# Patient Record
Sex: Female | Born: 1950
Health system: Southern US, Community
[De-identification: ages and names within clinical notes are randomized; demographics above are authoritative.]

## PROBLEM LIST (undated history)

## (undated) DIAGNOSIS — T7840XA Allergy, unspecified, initial encounter: Secondary | ICD-10-CM

## (undated) DIAGNOSIS — M1990 Unspecified osteoarthritis, unspecified site: Secondary | ICD-10-CM

## (undated) DIAGNOSIS — E785 Hyperlipidemia, unspecified: Secondary | ICD-10-CM

## (undated) DIAGNOSIS — E119 Type 2 diabetes mellitus without complications: Secondary | ICD-10-CM

## (undated) DIAGNOSIS — Z9109 Other allergy status, other than to drugs and biological substances: Secondary | ICD-10-CM

## (undated) DIAGNOSIS — H269 Unspecified cataract: Secondary | ICD-10-CM

## (undated) DIAGNOSIS — M810 Age-related osteoporosis without current pathological fracture: Secondary | ICD-10-CM

## (undated) DIAGNOSIS — R011 Cardiac murmur, unspecified: Secondary | ICD-10-CM

## (undated) DIAGNOSIS — L439 Lichen planus, unspecified: Secondary | ICD-10-CM

## (undated) DIAGNOSIS — K5792 Diverticulitis of intestine, part unspecified, without perforation or abscess without bleeding: Secondary | ICD-10-CM

## (undated) DIAGNOSIS — K219 Gastro-esophageal reflux disease without esophagitis: Secondary | ICD-10-CM

## (undated) HISTORY — DX: Unspecified cataract: H26.9

## (undated) HISTORY — DX: Allergy, unspecified, initial encounter: T78.40XA

## (undated) HISTORY — PX: CARPAL TUNNEL RELEASE: SHX101

## (undated) HISTORY — DX: Type 2 diabetes mellitus without complications: E11.9

## (undated) HISTORY — DX: Hyperlipidemia, unspecified: E78.5

## (undated) HISTORY — DX: Cardiac murmur, unspecified: R01.1

## (undated) HISTORY — DX: Unspecified osteoarthritis, unspecified site: M19.90

## (undated) HISTORY — DX: Age-related osteoporosis without current pathological fracture: M81.0

## (undated) HISTORY — DX: Diverticulitis of intestine, part unspecified, without perforation or abscess without bleeding: K57.92

## (undated) HISTORY — DX: Gastro-esophageal reflux disease without esophagitis: K21.9

---
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
## 1984-10-25 HISTORY — PX: BACK SURGERY: SHX140

## 2000-06-23 ENCOUNTER — Encounter: Admission: RE | Admit: 2000-06-23 | Discharge: 2000-06-23 | Payer: Self-pay | Admitting: Family Medicine

## 2000-07-20 ENCOUNTER — Encounter: Admission: RE | Admit: 2000-07-20 | Discharge: 2000-07-20 | Payer: Self-pay | Admitting: Family Medicine

## 2000-07-25 ENCOUNTER — Encounter (INDEPENDENT_AMBULATORY_CARE_PROVIDER_SITE_OTHER): Payer: Self-pay | Admitting: *Deleted

## 2000-07-27 ENCOUNTER — Encounter: Admission: RE | Admit: 2000-07-27 | Discharge: 2000-07-27 | Payer: Self-pay | Admitting: *Deleted

## 2000-07-27 ENCOUNTER — Encounter: Payer: Self-pay | Admitting: *Deleted

## 2000-10-07 ENCOUNTER — Encounter: Admission: RE | Admit: 2000-10-07 | Discharge: 2000-10-07 | Payer: Self-pay | Admitting: Family Medicine

## 2001-04-10 ENCOUNTER — Encounter: Admission: RE | Admit: 2001-04-10 | Discharge: 2001-04-10 | Payer: Self-pay | Admitting: Family Medicine

## 2001-09-11 ENCOUNTER — Encounter: Admission: RE | Admit: 2001-09-11 | Discharge: 2001-09-11 | Payer: Self-pay | Admitting: Family Medicine

## 2001-09-26 ENCOUNTER — Encounter: Admission: RE | Admit: 2001-09-26 | Discharge: 2001-09-26 | Payer: Self-pay | Admitting: Family Medicine

## 2001-10-23 ENCOUNTER — Encounter: Admission: RE | Admit: 2001-10-23 | Discharge: 2001-10-23 | Payer: Self-pay | Admitting: Family Medicine

## 2001-10-23 ENCOUNTER — Encounter: Payer: Self-pay | Admitting: Sports Medicine

## 2001-10-23 ENCOUNTER — Encounter: Admission: RE | Admit: 2001-10-23 | Discharge: 2001-10-23 | Payer: Self-pay | Admitting: Sports Medicine

## 2002-09-05 ENCOUNTER — Encounter: Admission: RE | Admit: 2002-09-05 | Discharge: 2002-09-05 | Payer: Self-pay | Admitting: Family Medicine

## 2002-09-19 ENCOUNTER — Encounter: Admission: RE | Admit: 2002-09-19 | Discharge: 2002-09-19 | Payer: Self-pay | Admitting: Family Medicine

## 2002-09-25 ENCOUNTER — Encounter: Admission: RE | Admit: 2002-09-25 | Discharge: 2002-09-25 | Payer: Self-pay | Admitting: Family Medicine

## 2002-10-03 ENCOUNTER — Encounter: Admission: RE | Admit: 2002-10-03 | Discharge: 2002-10-03 | Payer: Self-pay | Admitting: Family Medicine

## 2002-11-07 ENCOUNTER — Ambulatory Visit (HOSPITAL_COMMUNITY): Admission: RE | Admit: 2002-11-07 | Discharge: 2002-11-07 | Payer: Self-pay | Admitting: Family Medicine

## 2003-03-15 ENCOUNTER — Encounter: Admission: RE | Admit: 2003-03-15 | Discharge: 2003-03-15 | Payer: Self-pay | Admitting: Sports Medicine

## 2004-01-29 ENCOUNTER — Encounter: Admission: RE | Admit: 2004-01-29 | Discharge: 2004-01-29 | Payer: Self-pay | Admitting: Family Medicine

## 2004-05-13 ENCOUNTER — Encounter: Admission: RE | Admit: 2004-05-13 | Discharge: 2004-05-13 | Payer: Self-pay | Admitting: Family Medicine

## 2004-11-09 ENCOUNTER — Emergency Department (HOSPITAL_COMMUNITY): Admission: EM | Admit: 2004-11-09 | Discharge: 2004-11-09 | Payer: Self-pay | Admitting: Emergency Medicine

## 2005-08-18 ENCOUNTER — Emergency Department (HOSPITAL_COMMUNITY): Admission: EM | Admit: 2005-08-18 | Discharge: 2005-08-18 | Payer: Self-pay | Admitting: Emergency Medicine

## 2006-02-11 ENCOUNTER — Ambulatory Visit: Payer: Self-pay | Admitting: Family Medicine

## 2006-02-14 ENCOUNTER — Ambulatory Visit: Payer: Self-pay | Admitting: Family Medicine

## 2006-03-02 ENCOUNTER — Ambulatory Visit: Payer: Self-pay | Admitting: Sports Medicine

## 2006-04-01 ENCOUNTER — Ambulatory Visit: Payer: Self-pay | Admitting: Family Medicine

## 2006-04-01 ENCOUNTER — Ambulatory Visit (HOSPITAL_COMMUNITY): Admission: RE | Admit: 2006-04-01 | Discharge: 2006-04-01 | Payer: Self-pay | Admitting: Family Medicine

## 2006-04-08 ENCOUNTER — Ambulatory Visit: Payer: Self-pay | Admitting: Family Medicine

## 2006-04-29 ENCOUNTER — Ambulatory Visit: Payer: Self-pay | Admitting: Family Medicine

## 2006-08-31 ENCOUNTER — Ambulatory Visit (HOSPITAL_COMMUNITY): Admission: RE | Admit: 2006-08-31 | Discharge: 2006-08-31 | Payer: Self-pay | Admitting: Gastroenterology

## 2006-12-22 DIAGNOSIS — N393 Stress incontinence (female) (male): Secondary | ICD-10-CM | POA: Insufficient documentation

## 2006-12-22 DIAGNOSIS — E7800 Pure hypercholesterolemia, unspecified: Secondary | ICD-10-CM | POA: Insufficient documentation

## 2006-12-22 DIAGNOSIS — E785 Hyperlipidemia, unspecified: Secondary | ICD-10-CM | POA: Insufficient documentation

## 2006-12-23 ENCOUNTER — Encounter (INDEPENDENT_AMBULATORY_CARE_PROVIDER_SITE_OTHER): Payer: Self-pay | Admitting: *Deleted

## 2007-07-18 ENCOUNTER — Ambulatory Visit: Payer: Self-pay | Admitting: Internal Medicine

## 2007-08-15 ENCOUNTER — Telehealth (INDEPENDENT_AMBULATORY_CARE_PROVIDER_SITE_OTHER): Payer: Self-pay | Admitting: *Deleted

## 2007-08-15 ENCOUNTER — Ambulatory Visit: Payer: Self-pay | Admitting: Family Medicine

## 2007-08-17 ENCOUNTER — Ambulatory Visit: Payer: Self-pay | Admitting: Family Medicine

## 2007-08-17 ENCOUNTER — Telehealth: Payer: Self-pay | Admitting: *Deleted

## 2007-10-20 ENCOUNTER — Telehealth (INDEPENDENT_AMBULATORY_CARE_PROVIDER_SITE_OTHER): Payer: Self-pay | Admitting: *Deleted

## 2007-10-21 ENCOUNTER — Emergency Department (HOSPITAL_COMMUNITY): Admission: EM | Admit: 2007-10-21 | Discharge: 2007-10-21 | Payer: Self-pay | Admitting: Family Medicine

## 2008-05-27 ENCOUNTER — Telehealth: Payer: Self-pay | Admitting: *Deleted

## 2008-05-30 ENCOUNTER — Encounter (INDEPENDENT_AMBULATORY_CARE_PROVIDER_SITE_OTHER): Payer: Self-pay | Admitting: Family Medicine

## 2008-05-30 ENCOUNTER — Ambulatory Visit: Payer: Self-pay | Admitting: Family Medicine

## 2008-05-30 LAB — CONVERTED CEMR LAB
BUN: 13 mg/dL (ref 6–23)
Calcium: 9.3 mg/dL (ref 8.4–10.5)
Cholesterol: 221 mg/dL — ABNORMAL HIGH (ref 0–200)
Glucose, Bld: 113 mg/dL — ABNORMAL HIGH (ref 70–99)
LDL Cholesterol: 129 mg/dL — ABNORMAL HIGH (ref 0–99)
Total CHOL/HDL Ratio: 4.7

## 2008-06-14 ENCOUNTER — Encounter (INDEPENDENT_AMBULATORY_CARE_PROVIDER_SITE_OTHER): Payer: Self-pay | Admitting: Family Medicine

## 2008-06-14 ENCOUNTER — Ambulatory Visit: Payer: Self-pay | Admitting: Family Medicine

## 2008-06-18 ENCOUNTER — Encounter (INDEPENDENT_AMBULATORY_CARE_PROVIDER_SITE_OTHER): Payer: Self-pay | Admitting: Family Medicine

## 2008-06-26 ENCOUNTER — Ambulatory Visit: Payer: Self-pay | Admitting: Family Medicine

## 2008-07-02 ENCOUNTER — Ambulatory Visit (HOSPITAL_COMMUNITY): Admission: RE | Admit: 2008-07-02 | Discharge: 2008-07-02 | Payer: Self-pay | Admitting: Family Medicine

## 2008-08-21 ENCOUNTER — Telehealth (INDEPENDENT_AMBULATORY_CARE_PROVIDER_SITE_OTHER): Payer: Self-pay | Admitting: *Deleted

## 2008-08-21 ENCOUNTER — Ambulatory Visit: Payer: Self-pay | Admitting: Family Medicine

## 2008-08-21 DIAGNOSIS — R062 Wheezing: Secondary | ICD-10-CM

## 2008-08-22 ENCOUNTER — Telehealth (INDEPENDENT_AMBULATORY_CARE_PROVIDER_SITE_OTHER): Payer: Self-pay | Admitting: *Deleted

## 2008-08-27 ENCOUNTER — Telehealth (INDEPENDENT_AMBULATORY_CARE_PROVIDER_SITE_OTHER): Payer: Self-pay | Admitting: Family Medicine

## 2008-08-29 ENCOUNTER — Telehealth (INDEPENDENT_AMBULATORY_CARE_PROVIDER_SITE_OTHER): Payer: Self-pay | Admitting: *Deleted

## 2008-08-30 ENCOUNTER — Ambulatory Visit (HOSPITAL_COMMUNITY): Admission: RE | Admit: 2008-08-30 | Discharge: 2008-08-30 | Payer: Self-pay | Admitting: Family Medicine

## 2008-08-30 ENCOUNTER — Ambulatory Visit: Payer: Self-pay | Admitting: Family Medicine

## 2008-08-30 ENCOUNTER — Encounter (INDEPENDENT_AMBULATORY_CARE_PROVIDER_SITE_OTHER): Payer: Self-pay | Admitting: Family Medicine

## 2008-09-02 ENCOUNTER — Telehealth (INDEPENDENT_AMBULATORY_CARE_PROVIDER_SITE_OTHER): Payer: Self-pay | Admitting: Family Medicine

## 2008-09-02 LAB — CONVERTED CEMR LAB
Basophils Absolute: 0 10*3/uL (ref 0.0–0.1)
Basophils Relative: 0 % (ref 0–1)
Eosinophils Absolute: 0.6 10*3/uL (ref 0.0–0.7)
Lymphocytes Relative: 31 % (ref 12–46)
Lymphs Abs: 4.3 10*3/uL — ABNORMAL HIGH (ref 0.7–4.0)
MCHC: 32.5 g/dL (ref 30.0–36.0)
Neutro Abs: 8.2 10*3/uL — ABNORMAL HIGH (ref 1.7–7.7)
Neutrophils Relative %: 59 % (ref 43–77)
RDW: 15 % (ref 11.5–15.5)
TSH: 2.197 microintl units/mL (ref 0.350–4.50)

## 2008-09-10 ENCOUNTER — Encounter (INDEPENDENT_AMBULATORY_CARE_PROVIDER_SITE_OTHER): Payer: Self-pay | Admitting: Family Medicine

## 2008-10-22 ENCOUNTER — Telehealth: Payer: Self-pay | Admitting: *Deleted

## 2008-10-22 ENCOUNTER — Ambulatory Visit: Payer: Self-pay | Admitting: Family Medicine

## 2008-10-29 ENCOUNTER — Telehealth: Payer: Self-pay | Admitting: *Deleted

## 2008-11-01 ENCOUNTER — Ambulatory Visit: Payer: Self-pay | Admitting: Family Medicine

## 2008-11-07 ENCOUNTER — Ambulatory Visit: Payer: Self-pay | Admitting: Family Medicine

## 2009-04-07 ENCOUNTER — Encounter (INDEPENDENT_AMBULATORY_CARE_PROVIDER_SITE_OTHER): Payer: Self-pay | Admitting: Family Medicine

## 2009-04-23 ENCOUNTER — Ambulatory Visit: Payer: Self-pay | Admitting: Family Medicine

## 2009-04-23 ENCOUNTER — Telehealth: Payer: Self-pay | Admitting: Family Medicine

## 2009-04-23 DIAGNOSIS — B9789 Other viral agents as the cause of diseases classified elsewhere: Secondary | ICD-10-CM

## 2009-05-07 ENCOUNTER — Ambulatory Visit: Payer: Self-pay | Admitting: Family Medicine

## 2009-05-07 ENCOUNTER — Encounter: Payer: Self-pay | Admitting: Family Medicine

## 2009-05-07 DIAGNOSIS — J40 Bronchitis, not specified as acute or chronic: Secondary | ICD-10-CM

## 2009-05-14 ENCOUNTER — Telehealth: Payer: Self-pay | Admitting: Family Medicine

## 2009-05-15 ENCOUNTER — Ambulatory Visit (HOSPITAL_COMMUNITY): Admission: RE | Admit: 2009-05-15 | Discharge: 2009-05-15 | Payer: Self-pay | Admitting: *Deleted

## 2009-05-15 ENCOUNTER — Ambulatory Visit: Payer: Self-pay | Admitting: Family Medicine

## 2009-06-27 ENCOUNTER — Telehealth: Payer: Self-pay | Admitting: *Deleted

## 2009-07-01 ENCOUNTER — Ambulatory Visit: Payer: Self-pay | Admitting: Family Medicine

## 2009-07-01 DIAGNOSIS — R300 Dysuria: Secondary | ICD-10-CM

## 2009-07-01 DIAGNOSIS — R109 Unspecified abdominal pain: Secondary | ICD-10-CM

## 2009-07-01 LAB — CONVERTED CEMR LAB
Glucose, Urine, Semiquant: NEGATIVE
Nitrite: NEGATIVE
Protein, U semiquant: NEGATIVE
Urobilinogen, UA: 0.2
WBC Urine, dipstick: NEGATIVE
pH: 6

## 2009-08-19 ENCOUNTER — Ambulatory Visit: Payer: Self-pay | Admitting: Family Medicine

## 2009-08-19 DIAGNOSIS — J45909 Unspecified asthma, uncomplicated: Secondary | ICD-10-CM | POA: Insufficient documentation

## 2009-12-01 ENCOUNTER — Telehealth: Payer: Self-pay | Admitting: Family Medicine

## 2009-12-01 ENCOUNTER — Ambulatory Visit: Payer: Self-pay | Admitting: Family Medicine

## 2010-10-21 ENCOUNTER — Emergency Department (HOSPITAL_COMMUNITY)
Admission: EM | Admit: 2010-10-21 | Discharge: 2010-10-21 | Payer: Self-pay | Source: Home / Self Care | Admitting: Emergency Medicine

## 2010-11-24 NOTE — Progress Notes (Signed)
Summary: triage  Phone Note Call from Patient Call back at Home Phone [phone]   Caller: Patient Summary of Call: Thinks she has the flu coughing alot till she has a little bit of blood with it.  Would like to be seen today. Initial call taken by: Clydell Hakim,  December 01, 2009 9:08 AM  Follow-up for Phone Call        c/o flu symptoms since last thursday. coughing. states everyone at work has had the flu as well. 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 she denied earlier work in appts due to wait Follow-up by: Golden Circle RN,  December 01, 2009 10:08 AM

## 2010-11-24 NOTE — Assessment & Plan Note (Signed)
Summary: flu-like illness   Vital Signs:  Patient profile:   60 year old female Height:      61 inches Weight:      138.6 pounds BMI:     26.28 O2 Sat:      98 % on Room air Temp:     98.4 degrees F oral Pulse rate:   84 / minute BP sitting:   133 / 84  (left arm) Cuff size:   regular  Vitals Entered By: Gladstone Pih (December 01, 2009 3:14 PM)  O2 Flow:  Room air CC: C/O flu like symptoms Is Patient Diabetic? No Pain Assessment Patient in pain? no      Comments Body aches,cough,fever X 4 days   Primary Care Provider:  Doree Albee MD  CC:  C/O flu like symptoms.  History of Present Illness: 60yo F c/o flu-like symptoms  Flu-like symptoms: x 5 days.  Course of symptoms unchanged.  Cough, fevers, chills, body aches, chest discomfort with coughing, nausea, and diarrhea.  No hx of flu vaccination.  States she has been around co-workers with flu.    Habits & Providers  Alcohol-Tobacco-Diet     Tobacco Status: never  Current Medications (verified): 1)  Proventil Hfa 108 (90 Base) Mcg/act Aers (Albuterol Sulfate) .... 2 Puffs Qid As Needed For Wheezing 2)  Flonase 50 Mcg/act Susp (Fluticasone Propionate) .... 2 Squirts in Each Nostril Daily 3)  Flovent Hfa 110 Mcg/act Aero (Fluticasone Propionate  Hfa) .... One Puff Two Times A Day (This Is For Prevention) 4)  Ibuprofen 800 Mg Tabs (Ibuprofen) .Marland Kitchen.. 1 Tab Every 8 Hours As Needed For Pain With Food  Allergies (verified): 1)  ! Codeine 2)  ! * Contrast Dye  Social History: Smoking Status:  never  Review of Systems       Cough, fevers, chills, body aches, chest discomfort with coughing, nausea, and diarrhea  Physical Exam  General:  VS Reviewed. Non ill-l appearing, persistent cough.  Eyes:  no injected conjunctiva Nose:  no nasal drainage Mouth:  moist mucus membranes no erythema or exudate of post pharynx Neck:  supple, full ROM  Lungs:  Normal respiratory effort, chest expands symmetrically. Lungs are  clear to auscultation, no crackles or wheezes. Heart:  Normal rate and regular rhythm. S1 and S2 normal without gallop, murmur, click, rub or other extra sounds. Abdomen:  Soft, NT, ND, no HSM, active BS  Skin:  nl color and turgor   Impression & Recommendations:  Problem # 1:  VIRAL INFECTION (ICD-079.99) Assessment New flu-like illness.  Supportive care.  Out of the [place] for tamiflu.  She is to f/u if worsening symptoms.  Her updated medication list for this problem includes:    Ibuprofen 800 Mg Tabs (Ibuprofen) .Marland Kitchen... 1 tab every 8 hours as needed for pain with food  Orders: [facility]- Est Level  3 (99213) [facility]- Est Level  3 (99213)  Complete Medication List: 1)  Proventil Hfa 108 (90 Base) Mcg/act Aers (Albuterol sulfate) .... 2 puffs qid as needed for wheezing 2)  Flonase 50 Mcg/act Susp (Fluticasone propionate) .... 2 squirts in each nostril daily 3)  Flovent Hfa 110 Mcg/act Aero (Fluticasone propionate  hfa) .... One puff two times a day (this is for prevention) 4)  Ibuprofen 800 Mg Tabs (Ibuprofen) .Marland Kitchen.. 1 tab every 8 hours as needed for pain with food  Other Orders: Pulse Oximetry- [facility] ([address])  Patient Instructions: 1)  Please schedule a follow-up appointment as needed if symptoms  worsen. 2)  Alternate tylenol 500mg  (1 tab every 6 hours or 2 tabs every 8 hours as needed for fever and pain) with the ibuprofen provided. 3)  Pick up some mucinex DM for the cough.   Prescriptions: IBUPROFEN 800 MG TABS (IBUPROFEN) 1 tab every 8 hours as needed for pain with food  #15 x 0   Entered and Authorized by:   Marisue Ivan  MD   Signed by:   Marisue Ivan  MD on 12/01/2009   Method used:   Electronically to        [facility] (retail)       [address]       [place], [place]  [address]       Ph: [phone]       Fax: [phone]   RxID:   [id number]

## 2010-12-15 ENCOUNTER — Ambulatory Visit (INDEPENDENT_AMBULATORY_CARE_PROVIDER_SITE_OTHER): Payer: Self-pay | Admitting: Family Medicine

## 2010-12-15 ENCOUNTER — Encounter: Payer: Self-pay | Admitting: Family Medicine

## 2010-12-15 VITALS — BP 146/82 | HR 72 | Temp 98.3°F | Ht 62.0 in | Wt 138.6 lb

## 2010-12-15 DIAGNOSIS — R21 Rash and other nonspecific skin eruption: Secondary | ICD-10-CM | POA: Insufficient documentation

## 2010-12-15 DIAGNOSIS — B86 Scabies: Secondary | ICD-10-CM

## 2010-12-15 DIAGNOSIS — J45909 Unspecified asthma, uncomplicated: Secondary | ICD-10-CM

## 2010-12-15 MED ORDER — ALBUTEROL SULFATE HFA 108 (90 BASE) MCG/ACT IN AERS: 2.0000 | INHALATION_SPRAY | RESPIRATORY_TRACT | Status: DC | PRN

## 2010-12-15 MED ORDER — PERMETHRIN 5 % EX CREA: TOPICAL_CREAM | CUTANEOUS | Status: DC

## 2010-12-15 MED ORDER — FLUTICASONE PROPIONATE HFA 110 MCG/ACT IN AERO: 1.0000 | INHALATION_SPRAY | Freq: Two times a day (BID) | RESPIRATORY_TRACT | Status: DC

## 2010-12-15 NOTE — Progress Notes (Signed)
  Subjective:    Patient ID: Diana Roach, female    DOB: 1951-04-14, 60 y.o.   MRN: [id number]  HPI Patient presents with 1 month history of rash.  Describes red bumps starting on her legs and hands.  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  Since then, red bumps have spread to arms and feet as well as trunk and back.  Very itchy, some burning where she has scratched and causes bleeding.  Denies any URI symptoms recently.  Does have sister who has similar symptoms, treated by dermatologist but does not what for.  Lives with two children and husband, but no one else in house has similar symptoms.  Has tried Benadryl but this makes her drowsy, so she is hesitant to take it.  Also tried Cortisone cream OTC without relief.     Review of Systems No cough, chest pain, shortness of breath, runny nose.      Objective:   Physical Exam Gen:  Alert, cooperative patient who appears stated age in no acute distress.  Vital signs reviewed. Cardiac:  Regular rate and rhythm without murmur auscultated.  Good S1/S2. Pulm:  Clear to auscultation bilaterally with good air movement.  No wheezes or rales noted.   Skin:  Red papules as well as thin erythematous, linear red lesions which appear to be burrows noted on hands and pretibial aspects of legs as well as feet.  Multiple excoriation sites noted.         Assessment & Plan:

## 2010-12-15 NOTE — Patient Instructions (Addendum)
Take the treatment once, leave on for several hours and then wash off. If the redness isn't getting better, call and let us know. Use Benadryl cream for itching. Use Benadryl pills for itching. I hope you feel better!

## 2010-12-16 NOTE — Assessment & Plan Note (Signed)
Believe this is most likely to be scabies.  Precepted with Dr. [place] who agrees. To treat with Permethrin. Handout on Scabies provided as well as verbal instruction on clothing/bedding care. To call if no improvement.    Another possibility is contact dermatitis secondary to insulation exposure, however the lesions do not appear in that type distribution.  Consider Derm referral if no improvement.

## 2010-12-18 ENCOUNTER — Telehealth: Payer: Self-pay | Admitting: Family Medicine

## 2010-12-18 NOTE — Telephone Encounter (Signed)
Spoke with Dr. Gwendolyn Grant and his recommendation is that patient needs to come back in to be seen.  Made her an SDA appt for Monday am with the cross cover doctor.

## 2010-12-18 NOTE — Telephone Encounter (Signed)
Pt was told to call back if the cream she was given did not help

## 2010-12-18 NOTE — Telephone Encounter (Signed)
Will also route to there PCP.

## 2010-12-18 NOTE — Telephone Encounter (Signed)
Patient had OV on 2/21 for rash and was diagnosed with scabies.  Treated with Permethrin.  Calling today to say that rash is still bad and has even gotten worse.  Told her that if it truly was scabies then it may take a week for the rash to clear up and to continue using the Permethrin.  Saw in note that if not better Dr. Gwendolyn Grant suggested a Derm referral.   Will route this note to him for further advise.

## 2010-12-21 ENCOUNTER — Ambulatory Visit (INDEPENDENT_AMBULATORY_CARE_PROVIDER_SITE_OTHER): Payer: Self-pay | Admitting: Family Medicine

## 2010-12-21 ENCOUNTER — Encounter: Payer: Self-pay | Admitting: Family Medicine

## 2010-12-21 VITALS — BP 137/81 | HR 67 | Temp 98.4°F | Wt 139.0 lb

## 2010-12-21 DIAGNOSIS — R21 Rash and other nonspecific skin eruption: Secondary | ICD-10-CM

## 2010-12-21 MED ORDER — PREDNISONE 10 MG PO TABS: 20.0000 mg | ORAL_TABLET | Freq: Every day | ORAL | Status: AC

## 2010-12-21 NOTE — Telephone Encounter (Signed)
Appreciate everyone's help on this one!

## 2010-12-21 NOTE — Assessment & Plan Note (Signed)
Some lesions appear similar to Pityriasis Rosa, no nodular lesions to suggest nodosum, no burrow lines seen to suggest scabies, does not fit typical contact dermatitis picture. Pt is uninsured and not able to make an payments to see a dermatologist. Will treat with course of prednisone for 2 weeks and recheck If no change- would biopsy lesion

## 2010-12-21 NOTE — Progress Notes (Signed)
  Subjective:    Patient ID: Diana Roach, female    DOB: [birth year], 60 y.o.   MRN: [id number]  HPI  Pt seen 1 week ago for rash of unclear etiology, treated with Permethrin cream for possible scabies, previously tired OTC cortisone with no change in results, note unable to take Benadryl secondary to prolonged sedating effects. Rash started approx 1 month ago, at that time pt was exposed to new insulation at her work-place as well as new soap at home. Rash started as red area on both skins then spread to lower ext and upper ext, back and chest. Noted peeling on her feet at well. +pruritis, no fever, no recent viral illness, no change in stools, no joint pain, no new meds       Review of Systems per above     Objective:   Physical Exam    GEN- NAD HEENT-oropharynx clear, normal Mucous membranes SKIN- multiple erythematous scaly oval lesions on lower ext in multiple sizes, non blanching, trunk, peeling skin on soles of feet, no blisters noted, non tender, +excoriations on skin, no nodules palpated Lymp- no cervical LAD  Assessment & Plan:

## 2010-12-21 NOTE — Patient Instructions (Signed)
Call if you have any difficulty with the prednisone Try to get some sun on the area Return for a follow-up visit in 2 weeks

## 2011-01-04 LAB — DIFFERENTIAL
Basophils Absolute: 0.1 10*3/uL (ref 0.0–0.1)
Eosinophils Absolute: 0.7 10*3/uL (ref 0.0–0.7)
Eosinophils Relative: 6 % — ABNORMAL HIGH (ref 0–5)
Neutro Abs: 6.2 10*3/uL (ref 1.7–7.7)

## 2011-01-04 LAB — CBC
HCT: 38.9 % (ref 36.0–46.0)
Platelets: 391 10*3/uL (ref 150–400)
RBC: 4.84 MIL/uL (ref 3.87–5.11)

## 2011-01-04 LAB — POCT CARDIAC MARKERS: CKMB, poc: 3.9 ng/mL (ref 1.0–8.0)

## 2011-01-04 LAB — BASIC METABOLIC PANEL
BUN: 10 mg/dL (ref 6–23)
Chloride: 102 mEq/L (ref 96–112)
GFR calc non Af Amer: 55 mL/min — ABNORMAL LOW (ref >60)
Sodium: 139 mEq/L (ref 135–145)

## 2011-01-25 ENCOUNTER — Encounter: Payer: Self-pay | Admitting: Family Medicine

## 2011-01-25 ENCOUNTER — Ambulatory Visit (INDEPENDENT_AMBULATORY_CARE_PROVIDER_SITE_OTHER): Payer: Self-pay | Admitting: Family Medicine

## 2011-01-25 ENCOUNTER — Other Ambulatory Visit: Payer: Self-pay | Admitting: Family Medicine

## 2011-01-25 DIAGNOSIS — L299 Pruritus, unspecified: Secondary | ICD-10-CM

## 2011-01-25 DIAGNOSIS — J3489 Other specified disorders of nose and nasal sinuses: Secondary | ICD-10-CM

## 2011-01-25 DIAGNOSIS — L989 Disorder of the skin and subcutaneous tissue, unspecified: Secondary | ICD-10-CM | POA: Insufficient documentation

## 2011-01-25 DIAGNOSIS — L298 Other pruritus: Secondary | ICD-10-CM

## 2011-01-25 MED ORDER — CETIRIZINE HCL 10 MG PO TABS: 10.0000 mg | ORAL_TABLET | Freq: Every day | ORAL | Status: DC

## 2011-01-25 NOTE — Patient Instructions (Signed)
It was good to see you today Start taking zyrtec daily for itching If you develop any severe bleeding, itching, fever, or any other concerning sxs, please give [place] a call.  Follow up with me in 1 week to discuss your biopsy results  Otherwise, call with any questions,  God Bless, Doree Albee MD

## 2011-01-25 NOTE — Progress Notes (Signed)
Subjective:    Patient ID: Diana Roach, female    DOB: 02/09/1951, 60 y.o.   MRN: [id number]  HPI Clinic Visit 2/21: Patient presents with 1 month history of rash. Describes red bumps starting on her legs and hands. 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 Very itchy, some burning where she has scratched and causes bleeding. Denies any URI symptoms recently. Does have sister who has similar symptoms, treated by dermatologist but does not what for. Lives with two children and husband, but no one else in house has similar symptoms. Has tried Benadryl but this makes her drowsy, so she is hesitant to take it. Also tried Cortisone cream OTC without relief.--> Working dx of scabies, given permethrin tx.   Clinic Visit 2/27: Pt seen 1 week ago for rash of unclear etiology, treated with Permethrin cream for possible scabies, previously tired OTC cortisone with no change in results, note unable to take Benadryl secondary to prolonged sedating effects. Rash started approx 1 month ago, at that time pt was exposed to new insulation at her work-place as well as new soap at home. Rash started as red area on both skins then spread to lower ext and upper ext, back and chest. Noted peeling on her feet at well. +pruritis, no fever, no recent viral illness, no change in stools, no joint pain, no new meds.--> Rash unrelieved with permethrin--> gave dx of ? pytiriasis rosea and 2 wk. prednisone taper.   TODAY--> Rash x 3 months. Works in [employer] Services operational officer. Was placed on 2 week prednisone taper at most recent visit . Mild resolution in rash, however rash recurred after completion of prednisone course. + Chronic home insulation exposure. Pt also stepped in bucket of "wall-glide" 1 week ago per pt. Pt reports persistence of rash on lower back, stomach and axillary areas bilaterally. Rash pruritic in nature. Is not taking   antihistamines. Pt also reports + auto-immune family history with mother with ?lupus. Sister also with ?scleroderma. Pt denies any rash prior to 3 months ago.     Review of Systems See HPI     Objective:   Physical Exam Gen: Alert, cooperative patient who appears stated age in no acute distress. Vital signs reviewed.  Cardiac: Regular rate and rhythm without murmur auscultated. Good S1/S2.  Pulm: Clear to auscultation bilaterally with good air movement. No wheezes or rales noted.  Skin: Red papules as well as thin erythematous, linear red lesions which appear to be burrows noted on hands and pretibial aspects of legs as well as feet. Multiple excoriation sites noted--> unchanged from prior exam      Assessment & Plan:  Skin Lesions--> Plan for skin biopsy for definitve diagnosis. 2 sites on lower back identified for biopsy. Will place pt on zyrtec for antihistamine coverage. Will also plan for auto-immune work up pending biopsy results if findings indicative including ANA and ESR. Will also write out of work until follow up visit in 1 week.  Punch Biopsy Procedure Note:  A possible diagnosis of lupus and other etiologies for skin lesions were discussed with the patient and the need for two biopsies reviewed. Consent for the procedure was obtained. The skin of the lower back was prepped with alcohol and betadine. 2 punches of 3 mm were obtained from the lower back to the depth of the subcutaneous fat.  The specimen was placed in formalin  and was submitted to pathology. Band-Aid pressure   dressings were applied. Wound care instructions were given. The patient will return in 7 days for review of pathology results.

## 2011-02-01 ENCOUNTER — Telehealth: Payer: Self-pay | Admitting: Family Medicine

## 2011-02-01 DIAGNOSIS — L280 Lichen simplex chronicus: Secondary | ICD-10-CM | POA: Insufficient documentation

## 2011-02-01 HISTORY — DX: Lichen simplex chronicus: L28.0

## 2011-02-01 NOTE — Telephone Encounter (Signed)
Called pt to discuss results of skin biopsy. Pt states that rash has been relatively stable. Has not been taking zyrtec daily. No other chemical exposure per pt. Told pt that we would discussed biopsy findings more in depth at follow up visit on 02/03/11. Pt agreeable.

## 2011-02-03 ENCOUNTER — Telehealth: Payer: Self-pay | Admitting: Family Medicine

## 2011-02-03 ENCOUNTER — Encounter: Payer: Self-pay | Admitting: Family Medicine

## 2011-02-03 ENCOUNTER — Ambulatory Visit (INDEPENDENT_AMBULATORY_CARE_PROVIDER_SITE_OTHER): Payer: Self-pay | Admitting: Family Medicine

## 2011-02-03 VITALS — BP 122/74 | HR 72 | Temp 98.7°F | Ht 61.0 in | Wt 140.0 lb

## 2011-02-03 DIAGNOSIS — L439 Lichen planus, unspecified: Secondary | ICD-10-CM

## 2011-02-03 DIAGNOSIS — R21 Rash and other nonspecific skin eruption: Secondary | ICD-10-CM

## 2011-02-03 LAB — POCT URINALYSIS DIPSTICK
Bilirubin, UA: NEGATIVE
Blood, UA: NEGATIVE
Ketones, UA: NEGATIVE
Leukocytes, UA: NEGATIVE
Nitrite, UA: NEGATIVE
Urobilinogen, UA: 0.2

## 2011-02-03 LAB — CBC WITH DIFFERENTIAL/PLATELET
Basophils Relative: 1 % (ref 0–1)
Eosinophils Relative: 5 % (ref 0–5)
HCT: 40.3 % (ref 36.0–46.0)
Hemoglobin: 13.1 g/dL (ref 12.0–15.0)
MCV: 80.8 fL (ref 78.0–100.0)
Monocytes Absolute: 0.6 10*3/uL (ref 0.1–1.0)
Monocytes Relative: 5 % (ref 3–12)
Platelets: 407 10*3/uL — ABNORMAL HIGH (ref 150–400)
RBC: 4.99 MIL/uL (ref 3.87–5.11)
RDW: 15.3 % (ref 11.5–15.5)

## 2011-02-03 LAB — COMPREHENSIVE METABOLIC PANEL
ALT: 16 U/L (ref 0–35)
AST: 19 U/L (ref 0–37)
CO2: 25 mEq/L (ref 19–32)
Potassium: 4.1 mEq/L (ref 3.5–5.3)
Sodium: 139 mEq/L (ref 135–145)
Total Protein: 7.6 g/dL (ref 6.0–8.3)

## 2011-02-03 MED ORDER — ESOMEPRAZOLE MAGNESIUM 20 MG PO CPDR: 20.0000 mg | DELAYED_RELEASE_CAPSULE | Freq: Every day | ORAL | Status: DC

## 2011-02-03 MED ORDER — PREDNISONE 20 MG PO TABS: ORAL_TABLET | ORAL | Status: DC

## 2011-02-03 NOTE — Assessment & Plan Note (Signed)
Overalll history and skin exam consistent with Lichen Planus as diagnosed on biopsy. No oral involvement. Will treat with prolonged course of prednisone. Will also start on ppi in setting of baseline intermittent goody powder use. Pt instructed to avoid goody powder use while on prednisone. > 35 mins spent with patient discussing LP pathophysiology and course of treatment. Also, given pt's + family hx/o autoimmune disease (? Lupus in mother and ? Scleroderma in sister) and ? Articular involvement. Will start autoimmune workup including sed rate, CBC, CMET,  ANA. UA for any proteinuria.  There is also a documented coorelation of LP and Hep C. Will also check Hepatitis panel.   Will follow up in 2 weeks. If little improvement in symptoms is seen over next 2-4 weeks, pt will likely need calcineurin inhibitor/retinoid/PUVA treatment  which would warrant formal dermatology referral. Light duty letter also given for pt given recurrent work chemical exposure.

## 2011-02-03 NOTE — Patient Instructions (Addendum)
Lichen Planus Lichen planus is a skin problem that causes redness, itching, swelling and sores. Some common areas affected are:  The vulva and vagina.   The gums and inside of the mouth.   The skin of the arms, legs, chest, back and belly.   The fingernails or toenails.  The cause is not known. It could be an autoimmune illness or an allergy. An autoimmune illness is one where your body attacks itself. Lichen planus is not passed from one person to another. It can last for a long time. SIGNS OF LICHEN PLANUS:  You may have more vaginal discharge than usual. The discharge may be sticky, heavy and yellow.   The vaginal area may be red, sore, raw and have a burning feeling.   There may be pain or bleeding during sex.   Scarring may cause the vagina to become too short, narrow or even closed up.   There may be a reddish or purplish rash on the skin.   There may be redness or white patches on the gums or tongue.   The nails may become thin or rough or have ridges in them.  HOW WILL MY DOCTOR CHECK FOR LICHEN PLANUS? Your doctor will look for skin changes, changes inside your mouth, or vaginal discharge. Sometimes, a biopsy or small sample of skin may be sent for testing. TREATMENT  Keep the vaginal area as clean and dry as possible.   Your doctor may order a special cream to be put on the sores.   You may be given medicine to take by mouth.   You may be treated by exposure to ultraviolet light.   Sores in the mouth may be treated with special lozenges that are sucked on like a cough drop.   If the vagina becomes too tight, you may be taught how to use a dilator to keep it open.   I am placing you on  A prolonged course of prednisone for treatment of your rash. If you begin to develop any severe abdominal pain, irritation, or any other concerning symptoms, please give [place] a call.  STOP the goody powders I am also starting you on nexium to help protect your stomach.

## 2011-02-03 NOTE — Telephone Encounter (Signed)
Called to discuss lab results with patient. Told pt to use prednisone as prescribed. Will follow up in 2 weeks or sooner prn.

## 2011-02-03 NOTE — Progress Notes (Signed)
  Subjective:    Patient ID: Diana Roach, female    DOB: Jan 16, 1951, 60 y.o.   MRN: [id number]  HPI Pt here for follow up on rash. Rash: Pt recently biopsied in setting of 3 month history of recurrent rash. Biopsy showing lichen sclerosis. Pt placed on daily zyrtec in setting of persistent pruritis since last clinical visit. Pt reports minimal improvement in pruritis with medication. However medication has made pt very sleepy. Rash has somewhat progressed per pt with greater involvement in shoulders/axillary areas bilaterally. No fevers, headaches, abdominal pain, dysuria, hematuria. Pt does report some joint pain in shoulders and knees bilaterally. No joint swelling or erythema noted. No vision changes. No oral lesions/irritation noted. Has also had some nasal congestion, rhinorrhea, intermittent blood flecked flem. No cough, increased WOB, dyspnea per pt.  Biopsy result 02/01/11: Lichen Dermatitis    Review of Systems See HPI     Objective:   Physical Exam Gen: Alert, cooperative patient who appears stated age in no acute distress. Vital signs reviewed.  Cardiac: Regular rate and rhythm without murmur auscultated. Good S1/S2.  Pulm: Clear to auscultation bilaterally with good air movement. No wheezes or rales noted.  Skin: Red macular/papular lesions, blanching diffusely, + involvement in distal LEs bilaterally and feet, + involvement in axillary areas bilaterally and lower back.  MSK: Strength and ROM WNL diffusely. No joint effusions.        Assessment & Plan:  Rash: consistent with Lichen Planus as diagnosed on biopsy. No oral involvement. Will treat with prolonged course of prednisone. Will also start on ppi in setting of baseline intermittent goody powder use. Pt instructed to avoid goody powder use while on prednisone. > 35 mins spent with patient discussing LP pathophysiology and course of treatment. Also, given pt's + family hx/o autoimmune disease and ? Articular involvement.  Will start autoimmune workup including sed rate, ANA. There is also a documented coorelation of LP and Hep C. Will also check Hepatitis panel.   Will follow up in 2 weeks. If little improvement in symptoms is seen over next 2-4 weeks, pt will likely need calcineurin inhibitor/retinoid/PUVA treatment  which would warrant formal dermatology referral. Light duty letter also given for pt given recurrent work chemical exposure.

## 2011-02-12 ENCOUNTER — Encounter: Payer: Self-pay | Admitting: Family Medicine

## 2011-02-12 ENCOUNTER — Ambulatory Visit (INDEPENDENT_AMBULATORY_CARE_PROVIDER_SITE_OTHER): Payer: Self-pay | Admitting: Family Medicine

## 2011-02-12 ENCOUNTER — Telehealth: Payer: Self-pay | Admitting: Family Medicine

## 2011-02-12 VITALS — BP 140/77 | HR 83 | Temp 98.5°F | Wt 141.2 lb

## 2011-02-12 DIAGNOSIS — L439 Lichen planus, unspecified: Secondary | ICD-10-CM

## 2011-02-12 NOTE — Assessment & Plan Note (Signed)
Pt was started on a slow taper course of Prednisone for Lichen Planus. She has been taking 60mg  daily for 1 wk.  She is not tolerating the side effects.  She took 30mg  today.  Explained that prednisone is the best treatment for her. I can understand her wanting to stop the medication.  Advised pt to take 50mg  daily (starting tomorrow) f/u with Dr Alvester Morin.  She has an appt with him on Wed.  He may want to keep her on 50mg  for 1 or 2 wks then taper her to 40mg  for a longer time.  Pt may be able to tolerate the s/e this way.  She is agreeable to plan.

## 2011-02-12 NOTE — Progress Notes (Signed)
  Subjective:    Patient ID: Diana Roach, female    DOB: September 20, 1951, 60 y.o.   MRN: [id number]  HPI Lichen Planus: Was seen last week by Dr Alvester Morin for LE rash and was Dx with Lichen Planus.  He placed her on Prednisone with a long taper.  She has been on Prednisone 60mg  daily x 1 week.  She cannot tolerate the side effects.  Feeling short tempered.  Vision is blurry "foggy".  Head strain, like there is pressured squeezing the back of her head.   This is similar to what happened when she took high dose prednisone 1-2 yrs ago.  When she takes 20mg  the symptoms are more tolerable.  On 40mg  she was having similar symptoms.  She also complains of feelings of depression.    Today she took 30mg  of Prednisone.  She states that her rash is getting better.    Review of Systems  Constitutional: Negative for fever, chills and fatigue.  Eyes: Positive for visual disturbance.  Respiratory: Negative for cough, choking and wheezing.   Gastrointestinal: Positive for constipation. Negative for vomiting and diarrhea.  Musculoskeletal: Positive for arthralgias.       Objective:   Physical Exam  Constitutional: She is oriented to person, place, and time. She appears well-developed and well-nourished. No distress.  Cardiovascular: Normal rate, regular rhythm and normal heart sounds.   No murmur heard. Pulmonary/Chest: Effort normal and breath sounds normal. No respiratory distress. She has no wheezes.  Neurological: She is alert and oriented to person, place, and time. She has normal reflexes.  Skin: Skin is warm and dry. Rash noted. No erythema.       Thinly scattered patches of macular/papular red lesion on B/L LE.  Lesions are more intense on plantar surfaces of both feet that are tender. No edema.            Assessment & Plan:

## 2011-02-12 NOTE — Telephone Encounter (Signed)
Spoke with patient and she states 2 days after stared taking prednisone she started feeling bad. Currently taking 60 mg daily. Vision is blurry, feels like she looking out of a bubble, back of neck and head feels tight  And she is short tempered.  Will notify MD for further directions. Patient is wondering if prednisone dose  can be reduced.

## 2011-02-12 NOTE — Telephone Encounter (Signed)
Spoke with Dr. Alvester Morin and he advises that patient needs to be seen today regarding symptoms.  Appointment scheduled for today at 4:15 PM. She has already taken  Prednisone 30 mg today and usually takes the other 30 mg about this time. Advised to hold off for now but bring med with her to appointment . Dr. Perley Jain agrees with this plan.

## 2011-02-12 NOTE — Telephone Encounter (Signed)
Pt taking prednisone but its making her feel funny & having blurred vision, wants to know if she can decrease the dosage?

## 2011-02-17 ENCOUNTER — Encounter: Payer: Self-pay | Admitting: Family Medicine

## 2011-02-17 ENCOUNTER — Ambulatory Visit (INDEPENDENT_AMBULATORY_CARE_PROVIDER_SITE_OTHER): Payer: Self-pay | Admitting: Family Medicine

## 2011-02-17 DIAGNOSIS — J45909 Unspecified asthma, uncomplicated: Secondary | ICD-10-CM

## 2011-02-17 DIAGNOSIS — L439 Lichen planus, unspecified: Secondary | ICD-10-CM

## 2011-02-17 MED ORDER — FLUTICASONE PROPIONATE HFA 110 MCG/ACT IN AERO: 1.0000 | INHALATION_SPRAY | Freq: Two times a day (BID) | RESPIRATORY_TRACT | Status: DC

## 2011-02-17 MED ORDER — ESOMEPRAZOLE MAGNESIUM 20 MG PO CPDR: 20.0000 mg | DELAYED_RELEASE_CAPSULE | Freq: Every day | ORAL | Status: DC

## 2011-02-17 MED ORDER — ALBUTEROL SULFATE HFA 108 (90 BASE) MCG/ACT IN AERS: 2.0000 | INHALATION_SPRAY | RESPIRATORY_TRACT | Status: DC | PRN

## 2011-02-17 NOTE — Patient Instructions (Signed)
Esophagitis (Heartburn) Esophagitis (heartburn) is a painful, burning sensation in the chest. It may feel worse in certain positions, such as lying down or bending over. It is caused by stomach acid backing up into the tube that carries food from the mouth down to the stomach (lower esophagus). TREATMENT There are a number of non-prescription medicines used to treat heartburn, including:  Antacids.   Acid reducers (also called H-2 blockers).   Proton-pump inhibitors.  HOME CARE INSTRUCTIONS  Raise the head of your bead by putting blocks under the legs.   Eat 2-3 hours before going to bed.   Stop smoking.   Try to reach and maintain a healthy weight.   Do not eat just a few very large meals. Instead, eat many smaller meals throughout the day.   Try to identify foods and beverages that make your symptoms worse, and avoid these.   Avoid tight clothing.   Do not exercise right after eating.  SEEK IMMEDIATE MEDICAL CARE IF YOU:  Have severe chest pain that goes down your arm, or into your jaw or neck.   Feel sweaty, dizzy, or lightheaded.   Are short of breath.   Throw up (vomit) blood.   Have difficulty or pain with swallowing.   Have bloody or black, tarry stools.   Have bouts of heartburn more than three times a week for more than two weeks.  Document Released: 11/18/2004 Document Re-Released: 01/05/2010 [employer] Patient Information 2011 [employer]. 

## 2011-02-23 ENCOUNTER — Encounter: Payer: Self-pay | Admitting: Family Medicine

## 2011-02-23 ENCOUNTER — Ambulatory Visit (INDEPENDENT_AMBULATORY_CARE_PROVIDER_SITE_OTHER): Payer: Self-pay | Admitting: Family Medicine

## 2011-02-23 VITALS — BP 130/76 | HR 75 | Temp 98.1°F | Wt 144.0 lb

## 2011-02-23 DIAGNOSIS — L280 Lichen simplex chronicus: Secondary | ICD-10-CM

## 2011-02-23 DIAGNOSIS — L259 Unspecified contact dermatitis, unspecified cause: Secondary | ICD-10-CM

## 2011-02-23 DIAGNOSIS — J40 Bronchitis, not specified as acute or chronic: Secondary | ICD-10-CM

## 2011-02-23 MED ORDER — ALBUTEROL SULFATE (2.5 MG/3ML) 0.083% IN NEBU
2.5000 mg | INHALATION_SOLUTION | Freq: Once | RESPIRATORY_TRACT | Status: AC
  Administered 2011-02-23: 2.5 mg via RESPIRATORY_TRACT

## 2011-02-23 MED ORDER — DOXYCYCLINE HYCLATE 50 MG PO CAPS: 100.0000 mg | ORAL_CAPSULE | Freq: Two times a day (BID) | ORAL | Status: AC

## 2011-02-23 NOTE — Assessment & Plan Note (Signed)
Much improved since starting prednisone. Tolerating lower dose of prednisone better per pt. Will continue with regimen. Will follow up in 2-4 weeks. Also placed on nexium for GI ppx.

## 2011-02-23 NOTE — Progress Notes (Signed)
  Subjective:    Patient ID: Diana Roach, female    DOB: 1951/07/13, 60 y.o.   MRN: [id number]  HPI Pt here for follow up on rash. Pt recently started on extended course of prednisone in setting of biopsy confirmed lichen planus.  Rash overall much improved per pt. Severity of redness, as well as itching has improved significantly since starting prednisone per pt. Pt recently seen in clinic secondary to concern symptoms assd with prednisone use including increased energy and increased po intake. Pt was placed on 50 mg prednisone daily instead of 60 mg daily. Pt states that sxs/side effects have improved somewhat since decrease in medication. Pt feels that she has been able to tolerate side effects of lower dose better per pt.    Review of Systems See HPI     Objective:   Physical Exam Gen: Alert, cooperative patient who appears stated age in no acute distress. Vital signs reviewed.  Cardiac: Regular rate and rhythm without murmur auscultated. Good S1/S2.  Pulm: Clear to auscultation bilaterally with good air movement. No wheezes or rales noted.  Skin: Red macular/papular lesions, blanching diffusely, + involvement in distal LEs bilaterally and feet, + involvement in axillary areas bilaterally and lower back-->improved from previous exam.  MSK: Strength and ROM WNL diffusely. No joint effusions.     Assessment & Plan:  Lichen Planus- Much improved since starting prednisone. Tolerating lower dose of prednisone better per pt. Will continue with regimen. Will follow up in 2-4 weeks.

## 2011-02-23 NOTE — Progress Notes (Signed)
  Subjective:    Patient ID: Diana Roach, female    DOB: December 30, 1950, 60 y.o.   MRN: [id number]  HPI  Coughing since Thursday with congestion.  Has felt cold but no actual fever, feels that she picked it up at work as others are sick.  Being treated with systemic steroids for lichenoid dermatitis. She hears herself wheezing at night, has history of mild asthma.  She is out of Flovent (but on systemic steroids for other reasons) and her albuterol is outdated.  Went to the health department to get a refill on her inhalers and was told that she needs to enroll in MAP.  She has been too sick to gather the papers for such.  Review of Systems  Constitutional: Positive for unexpected weight change. Negative for fever and chills.       Gaining weight on prednisone  HENT: Negative for congestion, rhinorrhea and postnasal drip.   Respiratory: Positive for cough, chest tightness and wheezing.        Some blood tinged sputum  Cardiovascular: Negative for chest pain and leg swelling.       Objective:   Physical Exam  Constitutional:       Frequent cough, appeared moderately ill  HENT:  Right Ear: External ear normal.  Left Ear: External ear normal.  Nose: Nose normal.  Mouth/Throat: Oropharynx is clear and moist.  Eyes: Conjunctivae are normal. Right eye exhibits no discharge. Left eye exhibits no discharge.  Neck: No thyromegaly present.  Cardiovascular: Normal rate, regular rhythm and normal heart sounds.   Pulmonary/Chest: Effort normal. She has wheezes.  Lymphadenopathy:    She has no cervical adenopathy.  Skin: Rash noted.          Assessment & Plan:

## 2011-02-23 NOTE — Patient Instructions (Signed)
Tussin DM for cough, may use every 4hours safely Take all of the antibiotics but not on a empty stomach Lots of fluids Return if fever greater than 101

## 2011-02-24 NOTE — Assessment & Plan Note (Signed)
On a steroid taper, has gained a lot of weight

## 2011-02-24 NOTE — Assessment & Plan Note (Signed)
Has albuterol MDI expiration 12/11, so felt it was fine to use until she can find a way to get a new one, purchasing is over 100$.  Felt improvement after albuterol neb in office.  She is already on systemic steroids for another condition.  Will give a course of doxycycline for community acquired respiratory illness.

## 2011-03-12 NOTE — Op Note (Signed)
Diana Roach, Diana Roach NO.:  [id number]   MEDICAL RECORD NO.:  [id number]          PATIENT TYPE:  AMB   LOCATION:  ENDO                         FACILITY:  [facility]   PHYSICIAN:  Shirley Friar, MDDATE OF BIRTH:  [birth year]   DATE OF PROCEDURE:  08/31/2006  DATE OF DISCHARGE:                                 OPERATIVE REPORT   INDICATION:  Blood in stool on one occasion, family history of colon polyps.   MEDICATIONS:  Fentanyl 100 mcg IV, Versed 8 mg IV.   FINDINGS:  Rectal exam was normal.  A pediatric adjustable colonoscope was  inserted into a well prepped colon and advanced to the cecum where the  ileocecal valve and appendiceal orifice were identified.  The terminal ileum  was intubated and was normal in appearance.  On careful withdrawal the  colonoscope revealed no mucosal abnormalities including no polyps or  diverticula noted.  Retroflexion revealed small internal hemorrhoids.   ASSESSMENT:  Small internal hemorrhoids, otherwise normal colonoscopy.   PLAN:  1. Repeat colonoscopy in 5 years due to family history of colon polyps.  2. Avoid NSAIDs.  3. High-fiber diet.      Shirley Friar, MD  Electronically Signed     VCS/MEDQ  D:  08/31/2006  T:  08/31/2006  Job:  [id number]   cc:   Penni Bombard, MD

## 2011-03-15 ENCOUNTER — Ambulatory Visit: Payer: Self-pay | Admitting: Family Medicine

## 2011-04-07 ENCOUNTER — Ambulatory Visit (INDEPENDENT_AMBULATORY_CARE_PROVIDER_SITE_OTHER): Payer: Self-pay | Admitting: Family Medicine

## 2011-04-07 ENCOUNTER — Encounter: Payer: Self-pay | Admitting: Family Medicine

## 2011-04-07 VITALS — BP 118/70 | HR 70 | Temp 98.0°F | Ht 61.0 in | Wt 142.0 lb

## 2011-04-07 DIAGNOSIS — L439 Lichen planus, unspecified: Secondary | ICD-10-CM

## 2011-04-07 MED ORDER — BETAMETHASONE DIPROPIONATE 0.05 % EX CREA: TOPICAL_CREAM | Freq: Two times a day (BID) | CUTANEOUS | Status: AC

## 2011-04-07 NOTE — Patient Instructions (Signed)
It was good to see you today  Its good to see your rash is better For local areas of rash, use the topical steroid for treatment.  Follow up with me in 2-4 weeks to take a look at your rash again If you have any questions, or concerns, please give [place] a call God Bless, Doree Albee MD

## 2011-04-07 NOTE — Progress Notes (Signed)
  Subjective:    Patient ID: Diana Roach, female    DOB: 11-Oct-1951, 60 y.o.   MRN: [id number]  HPI Pt here for follow up on Lichen Planus: Pt was placed on 8 week taper of prednisone 02/03/11. Stopped taking prednisone approx 1 week ago. Rash resolved while on prednisone. Then recurred when prednisone d/c'd. Rash initially itchy, but now has resolved. Extent and distribution of rash greatly reduced than previously before. Previously disseminated across entire body; without oral involvement. Now, primarily on LEs and back.   Review of Systems See HPI     Objective:   Physical Exam Skin: Red macular/papular lesions, blanching; involvement in distal LEs bilaterally and lower back   Assessment & Plan:  Lichen Planus: Overall improved. Will give pt topical betamethasone for affected areas. Pt agreeable to plan.

## 2011-04-07 NOTE — Assessment & Plan Note (Signed)
Overall improved. Will give pt topical betamethasone for affected areas. Pt agreeable to plan. Will follow up in 2-4 weeks.

## 2011-04-08 ENCOUNTER — Telehealth: Payer: Self-pay | Admitting: Family Medicine

## 2011-04-08 NOTE — Telephone Encounter (Signed)
Cathy at [facility] called and stated that ms. Stopa could not get Rx for betamethasone dipropionate (DIPROLENE) 0.05 % cream due to cost. She asked if it was ok for them to change it to another antifungal cream. I agreed that this would be ok to make switch.Laureen Ochs, Viann Shove

## 2011-04-27 ENCOUNTER — Ambulatory Visit: Payer: Self-pay

## 2011-04-30 ENCOUNTER — Ambulatory Visit (HOSPITAL_COMMUNITY)
Admission: RE | Admit: 2011-04-30 | Discharge: 2011-04-30 | Disposition: A | Discharge status: Home / Self Care | Payer: Self-pay | Source: Ambulatory Visit | Attending: Family Medicine | Admitting: Family Medicine

## 2011-04-30 ENCOUNTER — Ambulatory Visit (INDEPENDENT_AMBULATORY_CARE_PROVIDER_SITE_OTHER): Payer: Self-pay | Admitting: Family Medicine

## 2011-04-30 VITALS — BP 132/88 | HR 97 | Temp 98.2°F | Wt 140.5 lb

## 2011-04-30 DIAGNOSIS — R059 Cough, unspecified: Secondary | ICD-10-CM | POA: Insufficient documentation

## 2011-04-30 DIAGNOSIS — R05 Cough: Secondary | ICD-10-CM

## 2011-04-30 DIAGNOSIS — J40 Bronchitis, not specified as acute or chronic: Secondary | ICD-10-CM

## 2011-04-30 DIAGNOSIS — J45901 Unspecified asthma with (acute) exacerbation: Secondary | ICD-10-CM

## 2011-04-30 DIAGNOSIS — R0602 Shortness of breath: Secondary | ICD-10-CM | POA: Insufficient documentation

## 2011-04-30 MED ORDER — PREDNISONE 50 MG PO TABS: 50.0000 mg | ORAL_TABLET | Freq: Every day | ORAL | Status: AC

## 2011-04-30 MED ORDER — METHYLPREDNISOLONE ACETATE 80 MG/ML IJ SUSP
80.0000 mg | Freq: Once | INTRAMUSCULAR | Status: AC
  Administered 2011-04-30: 80 mg via INTRAMUSCULAR

## 2011-04-30 MED ORDER — AZITHROMYCIN 500 MG PO TABS: 500.0000 mg | ORAL_TABLET | Freq: Every day | ORAL | Status: AC

## 2011-04-30 NOTE — Patient Instructions (Signed)
It was good to see you again I am starting you on azithromycin for your bronchitis I am also placing of prednisone. Take as prescribed over the next 6 weeks I also want to get a chest x-ray Follow up with me in 1 week for your breathing If you develop any fever, severe chest pain, or shortness of breath, call or go to the ED. God Bless, Doree Albee MD

## 2011-05-03 ENCOUNTER — Telehealth: Payer: Self-pay | Admitting: Family Medicine

## 2011-05-03 ENCOUNTER — Encounter: Payer: Self-pay | Admitting: Family Medicine

## 2011-05-03 DIAGNOSIS — J209 Acute bronchitis, unspecified: Secondary | ICD-10-CM | POA: Insufficient documentation

## 2011-05-03 NOTE — Telephone Encounter (Signed)
Pt calling re: rx for prednisone, says MD only prescribed it for 7 days & its supposed to be longer.

## 2011-05-03 NOTE — Progress Notes (Signed)
  Subjective:    Patient ID: Diana Roach, female    DOB: 12-23-1950, 60 y.o.   MRN: [id number]  HPI Cough, SOB x 1 week. Has been using albuterol witth minimal relief in sxs. Baseline hx/o asthma. No fevers, CP. Mild increased WOB relieved by albuterol. Non smoker. 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 Recently treated with extended course of prednisone for lichen planus. Respiratory status was stable during this time. Currently not on a controller medication out of cost.    Review of Systems     Objective:   Physical Exam Gen: up in chair, NAD HEENT: NCAT,EOMI,  PULM: Coarse breath sounds diffusely, end expiratory wheezes.       Assessment & Plan:

## 2011-05-03 NOTE — Assessment & Plan Note (Addendum)
Depo-medrol 80mg  in clinic x1. Will treat with prednisone 50mg  x 10 days. PRN albuterol for wheezing. Will cover with azithromycin for any atypical process. CXR to rule out focal PNA. No noted increased WOB on exam. Pulse ox reassuring. Will follow up in 1-2 weeks.

## 2011-05-04 NOTE — Telephone Encounter (Signed)
Pt is asking what results of xray also

## 2011-05-04 NOTE — Telephone Encounter (Signed)
Called and spoke to pt about results of x-ray as well as clarified prednisone question. Breathing status overall improved. Still with some intermittent coughing, though wheezing improved. Told pt to complete 7 day course of prednisone (Due for completion 05/07/11). Told pt to call on 7/13 if resp status still poor, at that point, we may extend course of prednisone. Pt agreeable to plan.

## 2011-05-10 ENCOUNTER — Encounter: Payer: Self-pay | Admitting: Family Medicine

## 2011-05-10 ENCOUNTER — Ambulatory Visit (INDEPENDENT_AMBULATORY_CARE_PROVIDER_SITE_OTHER): Payer: Self-pay | Admitting: Family Medicine

## 2011-05-10 DIAGNOSIS — J45909 Unspecified asthma, uncomplicated: Secondary | ICD-10-CM

## 2011-05-10 MED ORDER — FLUTICASONE-SALMETEROL 250-50 MCG/DOSE IN AEPB: 1.0000 | INHALATION_SPRAY | Freq: Two times a day (BID) | RESPIRATORY_TRACT | Status: DC

## 2011-05-10 NOTE — Patient Instructions (Signed)
Asthma, Adult Asthma is caused by narrowing of the air passages in the lungs. It may be triggered by pollen, dust, animal dander, molds, some foods, respiratory infections, exposure to smoke, exercise, emotional stress or other allergens (things that cause allergic reactions or allergies). Repeat attacks are common. HOME CARE INSTRUCTIONS  Use prescription medications as ordered by your caregiver.   Avoid pollen, dust, animal dander, molds, smoke and other things that cause attacks at home and at work.   You may have fewer attacks if you decrease dust in your home. Electrostatic air cleaners may help.   It may help to replace your pillows or mattress with materials less likely to cause allergies.   Talk to your caregiver about an action plan for managing asthma attacks at home, including, the use of a peak flow meter which measures the severity of your asthma attack. An action plan can help minimize or stop the attack without having to seek medical care.   If you are not on a fluid restriction, drink 8 to 10 glasses of water each day.   Always have a plan prepared for seeking medical attention, including, calling your physician, accessing local emergency care, and calling 911 (in the [place] for a severe attack.   Discuss possible exercise routines with your caregiver.   If animal dander is the cause of asthma, you may need to get rid of pets.  SEEK MEDICAL CARE IF:  You have wheezing and shortness of breath even if taking medicine to prevent attacks.   An oral temperature above 100.5 develops.   You have muscle aches, chest pain or thickening of sputum.   Your sputum changes from clear or white to yellow, green, gray or bloody.   You have any problems that may be related to the medicine you are taking (such as a rash, itching, swelling or trouble breathing).  SEEK IMMEDIATE MEDICAL CARE IF:  Your usual medicines do not stop your wheezing or there is increased coughing and/or  shortness of breath.   You have increased difficulty breathing.   You have an oral temperature above 100.5, not controlled by medicine.  MAKE SURE YOU:  Understand these instructions.   Will watch your condition.   Will get help right away if you are not doing well or get worse.  Document Released: 10/11/2005 Document Re-Released: 11/02/2009 Exit[employer] Patient Information 2011 [employer]. 

## 2011-05-13 DIAGNOSIS — J4540 Moderate persistent asthma, uncomplicated: Secondary | ICD-10-CM | POA: Insufficient documentation

## 2011-05-13 NOTE — Assessment & Plan Note (Signed)
Pt currently on prn albuterol. Pt would greatly benefit from an inhaled steroid as pt is noted to have 2 asthma exacerbations over the last 2-3 months. Currently there is a major cost concern. Case reviewed with Dr. Raymondo Band. Advair is noted to be on [facility] discount list. Will write RX for advair 250/50 to be filled at [facility]. 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 Instructed pt to avoid current occupation as much as possible as this is likely a secondary nidus for these recurrent asthma flares. Would like to also set up for outpt PFTs in the near future.

## 2011-05-13 NOTE — Progress Notes (Signed)
  Subjective:    Patient ID: Diana Roach, female    DOB: 1951-01-02, 60 y.o.   MRN: [id number]  HPI Pt here for follow up on recent episode of bronichiitis. Pt recently seen in clinic on 7/6 with increased WOB, wheezing and cough. Pt was treated with prednisone x 7 days  and azithromycin. CXR at the time showed no focal consolidations.  Today, pt states that her respiratory status is now at baseline. No wheezing, cough, no increased WOB. Is currently using albuterol on 1-2x/week basis. Is not on an inhaled steroid secondary to issue with cost.    Review of Systems See HPI     Objective:   Physical Exam Gen: up in chair, NAD  HEENT: NCAT,EOMI, no turbinate hypertrophy  PULM: good overall air movement, faint end expiratory wheezes ABD: S/NT/+ bowel sounds.        Assessment & Plan:  Asthma: Pt currently on prn albuterol. Pt would greatly benefit from an inhaled steroid as pt is noted to have 2 asthma exacerbations over the last 2-3 months. Currently there is a major cost concern. Case reviewed with Dr. Raymondo Band. Advair is noted to be on [facility] discount list. Will write RX for advair 250/50 to be filled at [facility]. 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 Instructed pt to avoid current occupation as much as possible as this is likely a secondary nidus for these recurrent asthma flares.

## 2011-05-28 ENCOUNTER — Ambulatory Visit: Payer: Self-pay | Admitting: Family Medicine

## 2011-06-02 ENCOUNTER — Ambulatory Visit: Payer: Self-pay | Admitting: Family Medicine

## 2011-06-11 ENCOUNTER — Ambulatory Visit (INDEPENDENT_AMBULATORY_CARE_PROVIDER_SITE_OTHER): Payer: Self-pay | Admitting: Family Medicine

## 2011-06-11 ENCOUNTER — Other Ambulatory Visit: Payer: Self-pay | Admitting: Family Medicine

## 2011-06-11 ENCOUNTER — Encounter: Payer: Self-pay | Admitting: Family Medicine

## 2011-06-11 DIAGNOSIS — J45909 Unspecified asthma, uncomplicated: Secondary | ICD-10-CM

## 2011-06-11 DIAGNOSIS — L439 Lichen planus, unspecified: Secondary | ICD-10-CM

## 2011-06-11 DIAGNOSIS — Z1231 Encounter for screening mammogram for malignant neoplasm of breast: Secondary | ICD-10-CM

## 2011-06-11 MED ORDER — MONTELUKAST SODIUM 10 MG PO TABS: 10.0000 mg | ORAL_TABLET | Freq: Every day | ORAL | Status: DC

## 2011-06-11 MED ORDER — PREDNISONE 5 MG PO TABS: 5.0000 mg | ORAL_TABLET | Freq: Every day | ORAL | Status: AC

## 2011-06-11 MED ORDER — LORATADINE 10 MG PO TABS: 10.0000 mg | ORAL_TABLET | Freq: Every day | ORAL | Status: DC

## 2011-06-11 NOTE — Patient Instructions (Signed)
It was good to see you today,  I am placing you on a low dose of prednisone for your asthma until you can afford and inhaled steroid I am also placing you on singluair and claritin since there may be an allergic component to your symptoms Be sure to wear a respirator whenever your working  Come back to see me in 2 months for a pap smear.  Call with any questions,  God Bless,  Doree Albee MD

## 2011-06-11 NOTE — Assessment & Plan Note (Signed)
Managed with prn topical steroid. Low dose oral steroid should also help with this.

## 2011-06-11 NOTE — Progress Notes (Signed)
  Subjective:    Patient ID: Diana Roach, female    DOB: 17-Mar-1951, 60 y.o.   MRN: [id number]  HPI  Pt is here for asthma follow up. Pt also recently had episode of bronchiitis 04/2011 that has improved . Pt was placed on azithromycin, prednisone burst and prn albuterol. Pt states breathing has improved since this point.  Pt states that up until last week, she has been using albuterol 2-3x  Per day. Pt states that she has used albuterol up to 4 times this week. No fevers. No cough.  Pt states that asthma is sometimes aggravated by work (pt works in Dance movement psychotherapist). Pt does report audible daily wheezing.  Has had some night time awakenings from wheezing. Has been prescribed flovent as maintenance inhaler, however, pt has not been able to afford.   Lichen Planus: Clinically much improved. Pt placed on extended course of prednisone from 01/2011.  Pt states that she had recent flare 1-2 weeks ago. Pt states that flare may be related to work exposure. Has been using topical betamethasone cream prn for rash.Rash improves with this.   Review of Systems See HPI        Objective:   Physical Exam Gen: up in chair, NAD  HEENT: NCAT,EOMI, no turbinate hypertrophy  PULM: good overall air movement, faint end expiratory wheezes  ABD: S/NT/+ bowel sounds.  SKIN: small, faint, well circumscribed erythematous macules on anterior shins bilaterally    Assessment & Plan:

## 2011-06-11 NOTE — Assessment & Plan Note (Addendum)
Peak flow in 250s today. Pt unable to attain inhaled steroid secondary to cost. Given persistence of wheezing and albuterol  inhaler use as well as occupational exposure will place on low oral steroid for symptomatic control. 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 Will likely transition to inhaled steroid at that point.  Red flags for return discussed.

## 2011-06-24 ENCOUNTER — Ambulatory Visit (HOSPITAL_COMMUNITY)
Admission: RE | Admit: 2011-06-24 | Discharge: 2011-06-24 | Disposition: A | Discharge status: Home / Self Care | Payer: Self-pay | Source: Ambulatory Visit | Attending: Family Medicine | Admitting: Family Medicine

## 2011-06-24 DIAGNOSIS — Z1231 Encounter for screening mammogram for malignant neoplasm of breast: Secondary | ICD-10-CM | POA: Insufficient documentation

## 2011-07-19 ENCOUNTER — Other Ambulatory Visit (HOSPITAL_COMMUNITY)
Admission: RE | Admit: 2011-07-19 | Discharge: 2011-07-19 | Disposition: A | Discharge status: Home / Self Care | Payer: Self-pay | Source: Ambulatory Visit | Attending: Family Medicine | Admitting: Family Medicine

## 2011-07-19 ENCOUNTER — Encounter: Payer: Self-pay | Admitting: Family Medicine

## 2011-07-19 ENCOUNTER — Ambulatory Visit (INDEPENDENT_AMBULATORY_CARE_PROVIDER_SITE_OTHER): Payer: Self-pay | Admitting: Family Medicine

## 2011-07-19 DIAGNOSIS — L659 Nonscarring hair loss, unspecified: Secondary | ICD-10-CM

## 2011-07-19 DIAGNOSIS — Z01419 Encounter for gynecological examination (general) (routine) without abnormal findings: Secondary | ICD-10-CM | POA: Insufficient documentation

## 2011-07-19 DIAGNOSIS — L821 Other seborrheic keratosis: Secondary | ICD-10-CM | POA: Insufficient documentation

## 2011-07-19 DIAGNOSIS — Z124 Encounter for screening for malignant neoplasm of cervix: Secondary | ICD-10-CM

## 2011-07-19 DIAGNOSIS — J45909 Unspecified asthma, uncomplicated: Secondary | ICD-10-CM

## 2011-07-19 HISTORY — DX: Nonscarring hair loss, unspecified: L65.9

## 2011-07-19 LAB — CBC
HCT: 39.7 % (ref 36.0–46.0)
Hemoglobin: 12.3 g/dL (ref 12.0–15.0)
MCH: 25.7 pg — ABNORMAL LOW (ref 26.0–34.0)
MCHC: 31 g/dL (ref 30.0–36.0)
RDW: 15.8 % — ABNORMAL HIGH (ref 11.5–15.5)

## 2011-07-19 LAB — TSH: TSH: 3.063 u[IU]/mL (ref 0.350–4.500)

## 2011-07-19 NOTE — Assessment & Plan Note (Signed)
Differential for this includes anemia, thyroid dysfunction, menopause related. I suspect that given recent blood donations may be tinnitus given acuity of onset. We'll check a CBC as well as TSH. Patient sure to use over-the-counter when his Rogaine in the interim. No clinical signs of alopecia areata on exam. Will followup in one to 2 months. If there is a microcytic component to patient's hemoglobin will likely start on iron.

## 2011-07-19 NOTE — Patient Instructions (Addendum)
It was good to see today I will like to get some blood work to check on your hair loss Use over-the-counter Rogaine for this. Uses as prescribed. We will do Pap today Be sure to take her prednisone as prescribed for your breathing Follow with me in about 3-6 months Call if any questions God Bless,  Doree Albee MD

## 2011-07-19 NOTE — Progress Notes (Signed)
  Subjective:    Patient ID: Diana Roach, female    DOB: 09-30-1951, 60 y.o.   MRN: [id number]  HPI  Hair loss: x 2 months. Notices that she has sinkful of hair anytime she washes hair. This has seemed to have gotten worse over time. Hair loss is more of a thinning pattern per patient. Patient also reports that she's been donating blood every month for the past 6 months. Patient denies any throat swelling consistent with goiter, or irritated scalp. Patient denies any new shampoos or excessive hair drying. Patient also reports a family history of hypothyroidism. LMP was greater than 15 years ago.  Skin lesion: Patient reports skin lesion present for the past approximately 6 months. This was noticed during her recent mammogram. Lesion is noted to be on medial side of left breast no surrounding redness or irritation per patient.  Asthma: Patient was recently placed on oral prednisone at last clinic visit as patient cannot afford inhaled steroids given lack of insurance. 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 Patient states that she's been intermittently taking the prednisone and using albuterol as needed. Patient notices she has had a worsening of her symptoms over the last 2 weeks. Patient was recently restarted taking the prednisone approximately 7 days ago. Respiratory symptoms have improved since this time.  Review of Systems See HPI     Objective:   Physical Exam Gen: up in chair, NAD HEENT: NCAT, EOMI, TMs clear bilaterally CV: RRR, no murmurs auscultated PULM: CTAB, good overall air movement, faint end expiratory wheezes Skin: Positive for 0.5 cm seborrheic keratosis on medial L breast ABD: S/NT/+ bowel sounds  GU: normal external genitalia, no vaginal discharge.  EXT: 2+ peripheral pulses   Assessment & Plan:

## 2011-07-19 NOTE — Assessment & Plan Note (Signed)
Discussed with patient that lesion left medial breast is likely benign in origin and is consistent with seborrheic keratosis or an age spot. Discussed with patient at flax return including redness purulent drainage or worsening irritation. Patient agreeable.

## 2011-07-19 NOTE — Assessment & Plan Note (Signed)
Pap performed today. If negative will need followup in 3 years.

## 2011-07-19 NOTE — Assessment & Plan Note (Signed)
Plan to continue with low dose oral prednisone. Patient states that she will be able to obtain insurance within the next 6-12 months. Would like to switch to inhaled steroid at that time.  Discussed importance of smoking trigger factor avoidance. Patient knows that she is in a rather precarious situation given her occupation. Continue when necessary albuterol.

## 2011-07-20 ENCOUNTER — Encounter: Payer: Self-pay | Admitting: Family Medicine

## 2011-10-14 ENCOUNTER — Ambulatory Visit (INDEPENDENT_AMBULATORY_CARE_PROVIDER_SITE_OTHER): Payer: Self-pay | Admitting: Family Medicine

## 2011-10-14 ENCOUNTER — Encounter: Payer: Self-pay | Admitting: Family Medicine

## 2011-10-14 VITALS — BP 146/82 | HR 92 | Temp 98.7°F | Ht 61.0 in | Wt 140.1 lb

## 2011-10-14 DIAGNOSIS — B9789 Other viral agents as the cause of diseases classified elsewhere: Secondary | ICD-10-CM

## 2011-10-14 DIAGNOSIS — B349 Viral infection, unspecified: Secondary | ICD-10-CM | POA: Insufficient documentation

## 2011-10-14 NOTE — Progress Notes (Signed)
SUBJECTIVE:  Diana Roach is a 60 y.o. female who present complaining of flu-like symptoms: fevers, chills, myalgias, congestion, sore throat and cough for 3 days. Has a history of asthma but denies dyspnea or wheezing.  She did not receive flu vaccine this year  Of note a child of one of her co-workers was diagnosed to pertussis this week.  ROS: Per HPI  OBJECTIVE: ZOX:[id number] moderately ill but not toxic; temperature as noted in vitals. HEENT:  Ears normal. Throat and pharynx normal.  Neck supple. No adenopathy in the neck. Sinuses non tender. Resp:  Normal effort, CTAB without wheezes CV: RRR

## 2011-10-14 NOTE — Progress Notes (Deleted)
  Subjective:    Patient ID: Diana Roach, female    DOB: 05/30/1951, 60 y.o.   MRN: [id number]  HPI    Review of Systems     Objective:   Physical Exam        Assessment & Plan:

## 2011-10-14 NOTE — Assessment & Plan Note (Signed)
Symptoms consistent with viral etiology, likely influenza. Does not seem to have symptoms of pertussis at this time. Advised symptomatic treatment with rest and intake of fluids. Given red flags to prompt a return, advised to call back, if not improving by next week.

## 2011-10-16 ENCOUNTER — Telehealth: Payer: Self-pay | Admitting: Family Medicine

## 2011-10-16 NOTE — Telephone Encounter (Signed)
Calling because cough worse since clinic visit.  Feels like she coughs till she almost throws up.  rec that pt go to UC if worse and not better.  Reminded her that clinic not open till 12/26.  Pt agrees to go

## 2011-10-17 ENCOUNTER — Encounter (HOSPITAL_COMMUNITY): Payer: Self-pay | Admitting: *Deleted

## 2011-10-17 ENCOUNTER — Emergency Department (INDEPENDENT_AMBULATORY_CARE_PROVIDER_SITE_OTHER)
Admission: EM | Admit: 2011-10-17 | Discharge: 2011-10-17 | Disposition: A | Discharge status: Home / Self Care | Payer: Self-pay | Source: Home / Self Care | Attending: Emergency Medicine | Admitting: Emergency Medicine

## 2011-10-17 DIAGNOSIS — R6889 Other general symptoms and signs: Secondary | ICD-10-CM

## 2011-10-17 DIAGNOSIS — J40 Bronchitis, not specified as acute or chronic: Secondary | ICD-10-CM

## 2011-10-17 DIAGNOSIS — J111 Influenza due to unidentified influenza virus with other respiratory manifestations: Secondary | ICD-10-CM

## 2011-10-17 HISTORY — DX: Lichen planus, unspecified: L43.9

## 2011-10-17 HISTORY — DX: Other allergy status, other than to drugs and biological substances: Z91.09

## 2011-10-17 MED ORDER — BENZONATATE 200 MG PO CAPS: 200.0000 mg | ORAL_CAPSULE | Freq: Three times a day (TID) | ORAL | Status: AC | PRN

## 2011-10-17 MED ORDER — AZITHROMYCIN 250 MG PO TABS: ORAL_TABLET | ORAL | Status: DC

## 2011-10-17 MED ORDER — AZITHROMYCIN 250 MG PO TABS: ORAL_TABLET | ORAL | Status: AC

## 2011-10-17 MED ORDER — ALBUTEROL SULFATE HFA 108 (90 BASE) MCG/ACT IN AERS: 1.0000 | INHALATION_SPRAY | Freq: Four times a day (QID) | RESPIRATORY_TRACT | Status: DC | PRN

## 2011-10-17 NOTE — ED Notes (Signed)
Started w/ fever and chills Tues; went to PCP Thurs - was told has influenza.  States feels like when she has bronchitis.  C/O productive cough, SOB and wheezing when she lays down.  Believes fevers has resolved.  C/O pain around ribcage.  Was also exposed to pertussis at work.  Has been taking OTC cough meds and Goody powders.

## 2011-10-17 NOTE — ED Provider Notes (Signed)
History     CSN: [id number]  Arrival date & time 10/17/11  1537   First MD Initiated Contact with Patient 10/17/11 1615      Chief Complaint  Patient presents with  . Fever  . Cough    (Consider location/radiation/quality/duration/timing/severity/associated sxs/prior treatment) HPI Comments: Ronni is a 60 year old female who has had a six-day history which began with feeling feverish, achy, and chilled. This then got better but she was left with a cough productive of green sputum, wheezing, nasal congestion with clear rhinorrhea, sneezing, headache, earache, sore throat, and nausea. She has been exposed to influenza and also possibly to whooping cough. She has not gotten the influenza vaccine. She's not sure what when her last Tdap was given if ever. She went to the doctor's office 4 days ago and she was diagnosed with influenza but not given any medication.  Patient is a 60 y.o. female presenting with fever and cough.  Fever Primary symptoms of the febrile illness include fever, fatigue, cough and nausea. Primary symptoms do not include wheezing, shortness of breath, abdominal pain, vomiting, diarrhea or rash.  Cough Associated symptoms include chills, rhinorrhea and sore throat. Pertinent negatives include no ear pain, no shortness of breath, no wheezing and no eye redness.    Past Medical History  Diagnosis Date  . Asthma   . Lichen planus   . Environmental allergies     Past Surgical History  Procedure Date  . Back surgery   . Appendectomy     Family History  Problem Relation Age of Onset  . Hypothyroidism Sister     History  Substance Use Topics  . Smoking status: Former Games developer  . Smokeless tobacco: Never Used  . Alcohol Use: No    OB History    Grav Para Term Preterm Abortions TAB SAB Ect Mult Living                  Review of Systems  Constitutional: Positive for fever, chills and fatigue.  HENT: Positive for congestion, sore throat and rhinorrhea.  Negative for ear pain, sneezing, neck stiffness, voice change and postnasal drip.   Eyes: Negative for pain, discharge and redness.  Respiratory: Positive for cough. Negative for chest tightness, shortness of breath and wheezing.   Gastrointestinal: Positive for nausea. Negative for vomiting, abdominal pain and diarrhea.  Skin: Negative for rash.    Allergies  Codeine and Contrast media  Home Medications   Current Outpatient Rx  Name Route Sig Dispense Refill  . ALBUTEROL SULFATE HFA 108 (90 BASE) MCG/ACT IN AERS Inhalation Inhale 2 puffs into the lungs every 4 (four) hours as needed. For wheezing. 1 Inhaler 0  . ALBUTEROL SULFATE HFA 108 (90 BASE) MCG/ACT IN AERS Inhalation Inhale 1-2 puffs into the lungs every 6 (six) hours as needed for wheezing. 1 Inhaler 0  . AZITHROMYCIN 250 MG PO TABS  Take as directed. 6 tablet 0  . BENZONATATE 200 MG PO CAPS Oral Take 1 capsule (200 mg total) by mouth 3 (three) times daily as needed for cough. 30 capsule 0  . BETAMETHASONE DIPROPIONATE 0.05 % EX CREA Topical Apply topically 2 (two) times daily. 45 g 3  . CETIRIZINE HCL 10 MG PO TABS Oral Take 1 tablet (10 mg total) by mouth daily. 30 tablet 11  . ESOMEPRAZOLE MAGNESIUM 20 MG PO CPDR Oral Take 1 capsule (20 mg total) by mouth daily. 30 capsule 1  . FLUTICASONE PROPIONATE 50 MCG/ACT NA SUSP Nasal 2 sprays  by Nasal route daily. Each nostril.     Marland Kitchen FLUTICASONE PROPIONATE  HFA 110 MCG/ACT IN AERO Inhalation Inhale 1 puff into the lungs 2 (two) times daily. This is for prevention. 1 Inhaler 0  . FLUTICASONE-SALMETEROL 250-50 MCG/DOSE IN AEPB Inhalation Inhale 1 puff into the lungs 2 (two) times daily. 60 each 6  . IBUPROFEN 800 MG PO TABS Oral Take 800 mg by mouth every 8 (eight) hours as needed. For pain.  Take with food.     Marland Kitchen LORATADINE 10 MG PO TABS Oral Take 1 tablet (10 mg total) by mouth daily. 30 tablet 6  . MONTELUKAST SODIUM 10 MG PO TABS Oral Take 1 tablet (10 mg total) by mouth at bedtime. 30  tablet 6    BP 162/93  Pulse 90  Temp(Src) 98.6 F (37 C) (Oral)  Resp 18  SpO2 100%  Physical Exam  Nursing note and vitals reviewed. Constitutional: She appears well-developed and well-nourished. No distress.  HENT:  Head: Normocephalic and atraumatic.  Right Ear: External ear normal.  Left Ear: External ear normal.  Nose: Nose normal.  Mouth/Throat: Oropharynx is clear and moist. No oropharyngeal exudate.  Eyes: Conjunctivae and EOM are normal. Pupils are equal, round, and reactive to light. Right eye exhibits no discharge. Left eye exhibits no discharge.  Neck: Normal range of motion. Neck supple.  Cardiovascular: Normal rate, regular rhythm and normal heart sounds.   Pulmonary/Chest: Effort normal and breath sounds normal. No stridor. No respiratory distress. She has no wheezes. She has no rales. She exhibits no tenderness.  Lymphadenopathy:    She has no cervical adenopathy.  Skin: Skin is warm and dry. No rash noted. She is not diaphoretic.    ED Course  Procedures (including critical care time)  Labs Reviewed - No data to display No results found.   1. Influenza-like illness   2. Bronchitis       MDM  I think she had influenza which has been complicated by bronchitis. I do not think she has pertussis.        Roque Lias, MD 10/17/11 [id number]

## 2011-10-22 ENCOUNTER — Ambulatory Visit (INDEPENDENT_AMBULATORY_CARE_PROVIDER_SITE_OTHER): Payer: Self-pay | Admitting: Family Medicine

## 2011-10-22 ENCOUNTER — Encounter: Payer: Self-pay | Admitting: Family Medicine

## 2011-10-22 VITALS — BP 129/88 | HR 75 | Temp 98.5°F | Ht 61.0 in | Wt 139.0 lb

## 2011-10-22 DIAGNOSIS — R109 Unspecified abdominal pain: Secondary | ICD-10-CM

## 2011-10-22 MED ORDER — METRONIDAZOLE 500 MG PO TABS: 500.0000 mg | ORAL_TABLET | Freq: Three times a day (TID) | ORAL | Status: DC

## 2011-10-22 MED ORDER — CIPROFLOXACIN HCL 500 MG PO TABS: 500.0000 mg | ORAL_TABLET | Freq: Two times a day (BID) | ORAL | Status: DC

## 2011-10-22 NOTE — Assessment & Plan Note (Signed)
Will treat empirically for diverticulitis with 7 day course of cipro and flagyl.  Patient had a colonoscopy 5 years ago which showed no polyps and no diverticulosis, recommended f/u in 5 years.    Recommended patient to schedule colonoscopy, and to follow-up with our office in 2 weeks for re-evaluation.  If no improvement, would consider further workup for intraabdominal/pelvic pathology.  Patient given red flags to return sooner.

## 2011-10-22 NOTE — Patient Instructions (Signed)
Will treat diverticulitis with two antibiotics cipro and flagyl  Follow-up in 2 weeks for recheck  You are due to repeat colonoscopy with Dr. Bosie Clos

## 2011-10-22 NOTE — Progress Notes (Signed)
  Subjective:    Patient ID: Diana Roach, female    DOB: October 22, 1951, 60 y.o.   MRN: [id number]  HPI  60 yo with history of diverticulitis here for evaluation of LLQ pain  States pain is similar to when she had flare of diverticulitis 4-5 years ago.  Symptoms include LLQ abdominal pain and constipation.  Pain for past 3 days, no fevers, nausea, vomiting diarrhea.  Had not had BM in about a week, but has been able to now have looser stools with aid of Miralax.  Has had recent viral URI which was resolving at the time 3 days ago she started having abdominal pain.  No dysuria, flank pain, vaginal discharge.  I have reviewed patient's  PMH, FH, and Social history and Medications as related to this visit.  Review of Systems General:  Negative for fever, chills, malaise, myalgias HEENT: Negative for conjunctivitis, ear pain or drainage, rhinorrhea,  sore throat Respiratory:  Negative for cough, sputum, dyspnea Abdomen: Negative for emesis, diarrhea Skin:  Negative for rash        Objective:   Physical Exam GEN: Alert & Oriented, No acute distress CV:  Regular Rate & Rhythm, no murmur Respiratory:  Normal work of breathing, CTAB Abd:  + BS, soft, mild TTP left lower quadrant, no flank pain, no rebound or guarding. Ext: no pre-tibial edema        Assessment & Plan:

## 2011-11-05 ENCOUNTER — Ambulatory Visit: Payer: Self-pay | Admitting: Family Medicine

## 2012-08-15 ENCOUNTER — Ambulatory Visit: Payer: Self-pay | Admitting: Family Medicine

## 2012-10-01 ENCOUNTER — Other Ambulatory Visit: Payer: Self-pay | Admitting: Family Medicine

## 2012-10-02 ENCOUNTER — Other Ambulatory Visit: Payer: Self-pay | Admitting: *Deleted

## 2012-10-02 DIAGNOSIS — J45909 Unspecified asthma, uncomplicated: Secondary | ICD-10-CM

## 2012-10-02 MED ORDER — ALBUTEROL SULFATE HFA 108 (90 BASE) MCG/ACT IN AERS: 2.0000 | INHALATION_SPRAY | RESPIRATORY_TRACT | Status: DC | PRN

## 2012-10-02 NOTE — Telephone Encounter (Signed)
Patient needs appt for any other refill. Not seen since 09/2011

## 2012-10-03 ENCOUNTER — Telehealth: Payer: Self-pay | Admitting: Family Medicine

## 2012-10-03 DIAGNOSIS — J45909 Unspecified asthma, uncomplicated: Secondary | ICD-10-CM

## 2012-10-03 MED ORDER — ALBUTEROL SULFATE HFA 108 (90 BASE) MCG/ACT IN AERS: 2.0000 | INHALATION_SPRAY | RESPIRATORY_TRACT | Status: DC | PRN

## 2012-10-03 NOTE — Telephone Encounter (Signed)
Patient is calling because her pharmacy didn't receive the refill for Albuterol and they told her that it may need to be called in.

## 2012-10-03 NOTE — Telephone Encounter (Signed)
Left message with patient's work for her to return call. She needs an office visit for any refills.Busick, Rodena Medin

## 2012-10-06 ENCOUNTER — Other Ambulatory Visit: Payer: Self-pay | Admitting: *Deleted

## 2012-10-06 ENCOUNTER — Other Ambulatory Visit: Payer: Self-pay | Admitting: Family Medicine

## 2012-10-06 DIAGNOSIS — J45909 Unspecified asthma, uncomplicated: Secondary | ICD-10-CM

## 2012-10-06 MED ORDER — FLUTICASONE PROPIONATE HFA 110 MCG/ACT IN AERO: 1.0000 | INHALATION_SPRAY | Freq: Two times a day (BID) | RESPIRATORY_TRACT | Status: DC

## 2012-10-06 NOTE — Telephone Encounter (Signed)
Patient MUST have office visit with PCP prior to any further refills. Has not been since since 2012.

## 2012-10-06 NOTE — Telephone Encounter (Signed)
Patient MUST have office visit with PCP prior to any further refills. Has not been since since 2012.  

## 2012-11-03 ENCOUNTER — Ambulatory Visit (INDEPENDENT_AMBULATORY_CARE_PROVIDER_SITE_OTHER): Payer: BC Managed Care – PPO | Admitting: *Deleted

## 2012-11-03 DIAGNOSIS — Z23 Encounter for immunization: Secondary | ICD-10-CM

## 2012-12-13 ENCOUNTER — Encounter: Payer: Self-pay | Admitting: Family Medicine

## 2012-12-13 ENCOUNTER — Ambulatory Visit (INDEPENDENT_AMBULATORY_CARE_PROVIDER_SITE_OTHER): Payer: BC Managed Care – PPO | Admitting: Family Medicine

## 2012-12-13 VITALS — BP 154/92 | HR 85 | Ht 61.0 in | Wt 125.0 lb

## 2012-12-13 MED ORDER — FLUTICASONE PROPIONATE HFA 110 MCG/ACT IN AERO: 1.0000 | INHALATION_SPRAY | Freq: Two times a day (BID) | RESPIRATORY_TRACT | Status: DC

## 2012-12-13 NOTE — Patient Instructions (Signed)
It was nice to meet you today.  I think you still have a cold.    I am refilling your Flovent for your asthma.  Use this every day.  Use the albuterol as needed.  You can use nasal saline rinses to help with some of the congestion.  You can sleep with a humidifier at night, which might help with some of the congestion at night time.  I want to see you back in about 1 month- we can discuss the derm referal and check on your asthma then.  Come back sooner if you start having worsening shortness of breath, fevers, worsening cough, or bad sinus pressure.

## 2012-12-13 NOTE — Assessment & Plan Note (Signed)
Likely viral URI, no red flags, symptomatic mgmt discussed, reasons to return discussed. F/u PRN

## 2012-12-13 NOTE — Assessment & Plan Note (Signed)
Now with insurance, sent in Flovent.  Continue albuterol PRN. Red flags for return discussed, otherwise f/u in 1 month.

## 2012-12-13 NOTE — Progress Notes (Signed)
S: Pt comes in today for SDA for asthma.  Started having some issues in January- runny nose, cough, congestion.  Got better, than got another cold.  No fevers/chills.  Right now, has some shortness of breath especially with laying down; + congestion and rhinorrhea; + cough- bringing up some clear sputum- had a speck of blood in it.  No N/V/D.  Tried some Robitussin last month but nothing recently.  Did have flu shot already this year.  Not using Flovent because she could not afford it.  Has been using albuterol 1-2 times per day a few days per week.    Has never been hospitalized or intubated because of asthma.  Diagnosed ~5 years ago.    ROS: Per HPI  History  Smoking status  . Former Smoker  Smokeless tobacco  . Never Used    O:  Filed Vitals:   12/13/12 1403  BP: 154/92  Pulse: 85    Gen: NAD HEENT: MMM, no pharyngeal erythema or exudate, no sinus TTP CV: RRR, no murmur Pulm: wheezing L > R, worst at LUL; no crackles   A/P: 63 y.o. female p/w asthma, viral URI -See problem list -f/u in 1 month

## 2013-01-09 ENCOUNTER — Encounter: Payer: Self-pay | Admitting: Family Medicine

## 2013-01-09 ENCOUNTER — Ambulatory Visit (INDEPENDENT_AMBULATORY_CARE_PROVIDER_SITE_OTHER): Payer: BC Managed Care – PPO | Admitting: Family Medicine

## 2013-01-09 VITALS — BP 137/88 | HR 67 | Ht 61.0 in | Wt 126.0 lb

## 2013-01-09 DIAGNOSIS — R21 Rash and other nonspecific skin eruption: Secondary | ICD-10-CM

## 2013-01-09 LAB — TSH: TSH: 1.787 u[IU]/mL (ref 0.350–4.500)

## 2013-01-09 MED ORDER — TRIAMCINOLONE ACETONIDE 0.5 % EX CREA: TOPICAL_CREAM | Freq: Two times a day (BID) | CUTANEOUS | Status: DC

## 2013-01-09 NOTE — Patient Instructions (Signed)
It was good to see you today.  I'm glad your asthma is doing better! Call your insurance company and ask what asthma inhaler medicines are preferred--- I am more than happy to change the medicine if we can get you one that is cheaper.  We took a sample of your rash today and will send it to the pathologist to look at under the microscope. In the meantime, I sent in a stronger steroid cream for you to use on it.  Come back to see me in a few weeks and we can go over what the punch biopsy showed and see if the steroid cream is helping.

## 2013-01-09 NOTE — Progress Notes (Signed)
S: Pt comes in today for follow up.  ASTHMA Restarted Flovent on 12/13/12 with albuterol PRN.  Was unable to afford medications for a few years, which is why she was not taking anything.  At that time, she was having SOB and cough.  Since starting her Flovent, feels like breathing is doing much better.  However, Flovent is still very expensive but was told that it shouldn't be as bad next time because she had a deductible.  Has only used albuterol 1 time.     RASH Has had issues with rash for the past 2 years.  Previously had it all over her body after stepping into a bucket of chemicals and insulation.  Previously tried po prednisone which made it go away, but then it came right back.  Is now only on the bottom of her legs.  + itching, no pain.  No drainage.  No fevers.  Previous biopsy 10/6107 showed lichenoid dermatitis.  This was done when it was all over her body.   Has been using cortisone cream, which helps with the itching but doesn't fully make the rash go away. Rash is purple, then starts to lighten up before it goes away or flares back up. She is interested in biopsy to find out if this rash is different since it is not going away with the steroid cream.    ROS: Per HPI  History  Smoking status  . Former Smoker  Smokeless tobacco  . Never Used    O:  Filed Vitals:   01/09/13 1345  BP: 137/88  Pulse: 67    Gen: NAD CV: RRR, no murmur Pulm: CTA bilat, no wheezes or crackles Skin: BLE- violaceous, polygonal plaques over anterior shins and lateral side of leg bilaterally; no lesions on calves or medial leg; no rash above the knee or below the ankles; some flesh colored/lighter plaques over legs where rash is healing    A/P: 62 y.o. female p/w BLE rash, asthma -See problem list -f/u in 3 weeks    PROCEDURE NOTE  Informed consent was obtained after risks and benefits of the procedure were explained. Patient elected to proceed with punch biopsy of skin rash. Newer lesions  were identified and it was decided to perform the biopsy on left anterior/lateral mid-shin.  Area was prepped with alcohol and 4cc of lidocaine with epinephrine were injected. Then, area was cleaned with betadine swab x2. 4mm punch biopsy was taken and specimen was placed into appropriate vessel using forceps. Hemostasis of the area was achieved with light pressure. Antibiotic ointment and band aid were applied. Patient tolerated procedure well. Signs/symptoms of infection or red flags for return were discussed.

## 2013-01-09 NOTE — Assessment & Plan Note (Signed)
Lungs clear, controlled on Flovent.  Pt will call insurance to see what they prefer. Continue PRN albuterol.

## 2013-01-09 NOTE — Assessment & Plan Note (Signed)
Biopsy done, likely with show lichenoid changes again. Will try higher potency topical steroids.

## 2013-01-23 ENCOUNTER — Ambulatory Visit (INDEPENDENT_AMBULATORY_CARE_PROVIDER_SITE_OTHER): Payer: BC Managed Care – PPO | Admitting: Family Medicine

## 2013-01-23 ENCOUNTER — Encounter: Payer: Self-pay | Admitting: Family Medicine

## 2013-01-23 VITALS — BP 116/74 | HR 74 | Temp 98.0°F | Ht 61.0 in | Wt 126.7 lb

## 2013-01-23 DIAGNOSIS — R21 Rash and other nonspecific skin eruption: Secondary | ICD-10-CM

## 2013-01-23 DIAGNOSIS — L439 Lichen planus, unspecified: Secondary | ICD-10-CM

## 2013-01-23 NOTE — Patient Instructions (Signed)
It was good to see you today.  I am putting in a referral to see the dermatologist to see if there is anything else they can do locally (right on the rash) to help.  Come back to see me in a few months so we can recheck your asthma.  Call your insurance company about the cost of the Flovent-- just call if they give you the name of a cheaper alternative.

## 2013-01-23 NOTE — Assessment & Plan Note (Signed)
No response to triamcinolone.  Could consider high potency topical steroid such as clobetasol, but will wait since pt would prefer referal to derm.  Next txt would be intralesional steroids.

## 2013-01-23 NOTE — Progress Notes (Signed)
S: Pt comes in today for follow up of rash.  Biopsy confirms lichen planus.  She was started on medium potency topical steroids 01/09/13, but has not had any improvement in her symptoms.  Still itching.  Rash may have gotten a little lighter.  No oozing or drainage from any lesions.  No fevers/chills, no skin redness other than where the rash.  Biopsy site is healing well.  Putting steroids on BID.  Itching is annoying but is not interfering with sleep or work.     ROS: Per HPI  History  Smoking status  . Former Smoker  Smokeless tobacco  . Never Used    O:  Filed Vitals:   01/23/13 1412  BP: 116/74  Pulse: 74  Temp: 98 F (36.7 C)    Gen: NAD Skin:  Multiple violaceous/erythematous plaques on bilateral shins, some with scaling; other hypopigmented areas (where rash is healing) over bilateral anterior lower extremities; no cellulities; 2 plaques have been scratched and are healing; biopsy site with well healing scab    A/P: 62 y.o. female p/w lichen planus -See problem list -f/u in 2-3 months for asthma

## 2013-02-01 ENCOUNTER — Telehealth: Payer: Self-pay | Admitting: Family Medicine

## 2013-02-01 NOTE — Telephone Encounter (Signed)
Patient is calling because she and the Dermatologist want a copy of the results for the blood work faxed to them.  It was Dr. Elease Etienne office.

## 2013-02-01 NOTE — Telephone Encounter (Signed)
Faxed pathology and lab results to Dr.Lupton # [id number] .Diana Roach

## 2013-02-06 ENCOUNTER — Encounter: Payer: Self-pay | Admitting: Family Medicine

## 2013-02-25 ENCOUNTER — Encounter (HOSPITAL_COMMUNITY): Payer: Self-pay | Admitting: *Deleted

## 2013-02-25 ENCOUNTER — Emergency Department (HOSPITAL_COMMUNITY)
Admission: EM | Admit: 2013-02-25 | Discharge: 2013-02-25 | Disposition: A | Discharge status: Home / Self Care | Payer: BC Managed Care – PPO | Source: Home / Self Care | Attending: Family Medicine | Admitting: Family Medicine

## 2013-02-25 DIAGNOSIS — J309 Allergic rhinitis, unspecified: Secondary | ICD-10-CM

## 2013-02-25 DIAGNOSIS — J302 Other seasonal allergic rhinitis: Secondary | ICD-10-CM

## 2013-02-25 MED ORDER — BUDESONIDE 32 MCG/ACT NA SUSP: 1.0000 | Freq: Two times a day (BID) | NASAL | Status: DC

## 2013-02-25 MED ORDER — METHYLPREDNISOLONE ACETATE 40 MG/ML IJ SUSP
INTRAMUSCULAR | Status: AC
  Filled 2013-02-25: qty 10

## 2013-02-25 MED ORDER — FEXOFENADINE HCL 180 MG PO TABS: 180.0000 mg | ORAL_TABLET | Freq: Every day | ORAL | Status: DC

## 2013-02-25 MED ORDER — TRIAMCINOLONE ACETONIDE 40 MG/ML IJ SUSP
INTRAMUSCULAR | Status: AC
  Filled 2013-02-25: qty 5

## 2013-02-25 MED ORDER — METHYLPREDNISOLONE ACETATE 40 MG/ML IJ SUSP
80.0000 mg | Freq: Once | INTRAMUSCULAR | Status: AC
  Administered 2013-02-25: 80 mg via INTRAMUSCULAR

## 2013-02-25 MED ORDER — TRIAMCINOLONE ACETONIDE 40 MG/ML IJ SUSP
40.0000 mg | Freq: Once | INTRAMUSCULAR | Status: AC
  Administered 2013-02-25: 40 mg via INTRAMUSCULAR

## 2013-02-25 NOTE — ED Notes (Signed)
Patient complains of chest congestion and cough with shortness of breath with chills. Denies fever.

## 2013-02-25 NOTE — ED Provider Notes (Addendum)
History     CSN: [id number]  Arrival date & time 02/25/13  1242   None     Chief Complaint  Patient presents with  . URI    (Consider location/radiation/quality/duration/timing/severity/associated sxs/prior treatment) Patient is a 62 y.o. female presenting with cough. The history is provided by the patient.  Cough Cough characteristics:  Non-productive Severity:  Mild Onset quality:  Gradual Duration:  6 days Timing:  Intermittent Progression:  Unchanged Chronicity:  New Smoker: no   Context: weather changes   Ineffective treatments:  Cough suppressants Associated symptoms: rhinorrhea and sinus congestion   Associated symptoms: no fever, no rash, no shortness of breath and no wheezing     Past Medical History  Diagnosis Date  . Asthma   . Lichen planus   . Environmental allergies     Past Surgical History  Procedure Laterality Date  . Back surgery    . Appendectomy      Family History  Problem Relation Age of Onset  . Hypothyroidism Sister     History  Substance Use Topics  . Smoking status: Former Games developer  . Smokeless tobacco: Never Used  . Alcohol Use: No    OB History   Grav Para Term Preterm Abortions TAB SAB Ect Mult Living                  Review of Systems  Constitutional: Negative.  Negative for fever.  HENT: Positive for congestion, rhinorrhea, sneezing and postnasal drip.   Respiratory: Positive for cough. Negative for shortness of breath and wheezing.   Cardiovascular: Negative.   Gastrointestinal: Negative.   Skin: Negative for rash.    Allergies  Codeine and Contrast media  Home Medications   Current Outpatient Rx  Name  Route  Sig  Dispense  Refill  . albuterol (PROVENTIL HFA) 108 (90 BASE) MCG/ACT inhaler   Inhalation   Inhale 2 puffs into the lungs every 4 (four) hours as needed. For wheezing.   1 Inhaler   0     Patient needs appt for any other refill.   Marland Kitchen EXPIRED: albuterol (PROVENTIL HFA;VENTOLIN HFA) 108 (90 BASE)  MCG/ACT inhaler   Inhalation   Inhale 1-2 puffs into the lungs every 6 (six) hours as needed for wheezing.   1 Inhaler   0   . budesonide (RHINOCORT AQUA) 32 MCG/ACT nasal spray   Nasal   Place 1 spray into the nose 2 (two) times daily. One spray each nostril bid   1 Bottle   1   . EXPIRED: cetirizine (ZYRTEC) 10 MG tablet   Oral   Take 1 tablet (10 mg total) by mouth daily.   30 tablet   11   . EXPIRED: esomeprazole (NEXIUM) 20 MG capsule   Oral   Take 1 capsule (20 mg total) by mouth daily.   30 capsule   1   . fexofenadine (ALLEGRA) 180 MG tablet   Oral   Take 1 tablet (180 mg total) by mouth daily.   30 tablet   1   . fluticasone (FLONASE) 50 MCG/ACT nasal spray   Nasal   2 sprays by Nasal route daily. Each nostril.          . fluticasone (FLOVENT HFA) 110 MCG/ACT inhaler   Inhalation   Inhale 1 puff into the lungs 2 (two) times daily. This is for prevention.   1 Inhaler   5   . ibuprofen (ADVIL,MOTRIN) 800 MG tablet   Oral   Take  800 mg by mouth every 8 (eight) hours as needed. For pain.  Take with food.          Marland Kitchen EXPIRED: loratadine (CLARITIN) 10 MG tablet   Oral   Take 1 tablet (10 mg total) by mouth daily.   30 tablet   6   . EXPIRED: montelukast (SINGULAIR) 10 MG tablet   Oral   Take 1 tablet (10 mg total) by mouth at bedtime.   30 tablet   6   . triamcinolone cream (KENALOG) 0.5 %   Topical   Apply topically 2 (two) times daily.   90 g   1     BP 131/78  Pulse 61  Temp(Src) 98.2 F (36.8 C) (Oral)  Resp 18  SpO2 98%  Physical Exam  Nursing note and vitals reviewed. Constitutional: She is oriented to person, place, and time. She appears well-developed and well-nourished.  HENT:  Head: Normocephalic.  Right Ear: External ear normal.  Left Ear: External ear normal.  Nose: Mucosal edema and rhinorrhea present.  Mouth/Throat: Oropharynx is clear and moist.  Eyes: Pupils are equal, round, and reactive to light.  Neck: Normal  range of motion. Neck supple.  Cardiovascular: Normal rate and regular rhythm.   Pulmonary/Chest: Effort normal and breath sounds normal. She has no wheezes.  Lymphadenopathy:    She has no cervical adenopathy.  Neurological: She is alert and oriented to person, place, and time.  Skin: Skin is warm and dry.    ED Course  Procedures (including critical care time)  Labs Reviewed - No data to display No results found.   1. Seasonal allergic reaction       MDM          Linna Hoff, MD 02/25/13 1352  Linna Hoff, MD 02/25/13 Mikle Bosworth

## 2013-02-27 ENCOUNTER — Encounter: Payer: Self-pay | Admitting: Family Medicine

## 2013-02-27 ENCOUNTER — Ambulatory Visit (INDEPENDENT_AMBULATORY_CARE_PROVIDER_SITE_OTHER): Payer: BC Managed Care – PPO | Admitting: Family Medicine

## 2013-02-27 VITALS — BP 141/74 | HR 56 | Temp 98.0°F | Ht 61.0 in | Wt 127.5 lb

## 2013-02-27 DIAGNOSIS — J45909 Unspecified asthma, uncomplicated: Secondary | ICD-10-CM

## 2013-02-27 MED ORDER — HYDROCODONE-HOMATROPINE 5-1.5 MG/5ML PO SYRP: 5.0000 mL | ORAL_SOLUTION | Freq: Three times a day (TID) | ORAL | Status: DC | PRN

## 2013-02-27 MED ORDER — PREDNISOLONE SODIUM PHOSPHATE 15 MG/5ML PO SOLN: 40.0000 mg | Freq: Every day | ORAL | Status: DC

## 2013-02-27 MED ORDER — MINOCYCLINE HCL 100 MG PO CAPS: 100.0000 mg | ORAL_CAPSULE | Freq: Two times a day (BID) | ORAL | Status: DC

## 2013-02-27 NOTE — Patient Instructions (Signed)
I think that the nasal spray and Claritin are a good idea long-term.  Go ahead and start these.  For now, use the cough syrup at night.  Don't drive with this medicine.  Take the prednisolone for 5 days.   Take the Minocycline twice a day for 7 days.

## 2013-02-27 NOTE — Progress Notes (Signed)
Subjective:    Diana Roach is a 62 y.o. female who presents to [facility] today with complaints of cough:  1.  Cough: Patient has a history of asthma and has had increasing cough for the past 2 weeks. She has had increased use of albuterol at home. She still wakes up multiple times at night with coughing. Productive of green sputum versus occasional dry hacking cough.  No fevers or chills. She presented to an urgent care 3 days ago and diagnosed with URI. She was given a shot of Depo-Medrol that time. She was also recommended to start Flonase and Claritin although she has not started taking this.  She's had some nasal congestion and eye drainage. She does have history of seasonal allergies.   The following portions of the patient's history were reviewed and updated as appropriate: allergies, current medications, past medical history, family and social history, and problem list. Patient is a nonsmoker.    PMH reviewed.  Past Medical History  Diagnosis Date  . Asthma   . Lichen planus   . Environmental allergies    Past Surgical History  Procedure Laterality Date  . Back surgery    . Appendectomy      Medications reviewed. Current Outpatient Prescriptions  Medication Sig Dispense Refill  . albuterol (PROVENTIL HFA) 108 (90 BASE) MCG/ACT inhaler Inhale 2 puffs into the lungs every 4 (four) hours as needed. For wheezing.  1 Inhaler  0  . albuterol (PROVENTIL HFA;VENTOLIN HFA) 108 (90 BASE) MCG/ACT inhaler Inhale 1-2 puffs into the lungs every 6 (six) hours as needed for wheezing.  1 Inhaler  0  . budesonide (RHINOCORT AQUA) 32 MCG/ACT nasal spray Place 1 spray into the nose 2 (two) times daily. One spray each nostril bid  1 Bottle  1  . cetirizine (ZYRTEC) 10 MG tablet Take 1 tablet (10 mg total) by mouth daily.  30 tablet  11  . esomeprazole (NEXIUM) 20 MG capsule Take 1 capsule (20 mg total) by mouth daily.  30 capsule  1  . fexofenadine (ALLEGRA) 180 MG tablet Take 1 tablet (180 mg  total) by mouth daily.  30 tablet  1  . fluticasone (FLONASE) 50 MCG/ACT nasal spray 2 sprays by Nasal route daily. Each nostril.       . fluticasone (FLOVENT HFA) 110 MCG/ACT inhaler Inhale 1 puff into the lungs 2 (two) times daily. This is for prevention.  1 Inhaler  5  . ibuprofen (ADVIL,MOTRIN) 800 MG tablet Take 800 mg by mouth every 8 (eight) hours as needed. For pain.  Take with food.       . loratadine (CLARITIN) 10 MG tablet Take 1 tablet (10 mg total) by mouth daily.  30 tablet  6  . montelukast (SINGULAIR) 10 MG tablet Take 1 tablet (10 mg total) by mouth at bedtime.  30 tablet  6  . triamcinolone cream (KENALOG) 0.5 % Apply topically 2 (two) times daily.  90 g  1   No current facility-administered medications for this visit.    ROS as above otherwise neg.  No chest pain, palpitations, SOB, Fever, Chills, Abd pain, N/V/D.   Objective:   Physical Exam BP 141/74  Pulse 56  Temp(Src) 98 F (36.7 C) (Oral)  Ht 5\' 1"  (1.549 m)  Wt 127 lb 8 oz (57.834 kg)  BMI 24.1 kg/m2  SpO2 99% Gen:  Alert, cooperative patient who appears stated age in no acute distress.  Vital signs reviewed. HEENT: EOMI,  MMM. Nasal turbinates  are boggy and enlarged bilaterally with some cobblestoning  Cardiac:  Regular rate and rhythm without murmur auscultated.  Good S1/S2. Pulm:  Some wheezing noted bilateral bases. Good air movement throughout. Abd:  Soft/nondistended/nontender.  Good bowel sounds throughout all four quadrants.  No masses noted.  Exts: Non edematous BL  LE, warm and well perfused.   No results found for this or any previous visit (from the past 72 hour(s)).

## 2013-02-28 NOTE — Assessment & Plan Note (Signed)
Seems like an acute exacerbation secondary to worsening seasonal allergies. She is to take her OTC antihistamine plus Flonase. Provided her with minocycline/5 day course of prednisolone she has trouble with prednisone pills (makes her hungry) to treat his asthma exacerbation. Also provided her with Hycodan cough syrup to use at nighttime. Warnings against driving provided. Followup in one week if no improvement sooner if worsening

## 2013-03-01 ENCOUNTER — Telehealth: Payer: Self-pay | Admitting: Family Medicine

## 2013-03-01 MED ORDER — AZITHROMYCIN 250 MG PO TABS: ORAL_TABLET | ORAL | Status: DC

## 2013-03-01 NOTE — Telephone Encounter (Signed)
Pt called and informed that new script was waiting and that minocycline would be added to allergy list. No further questions. Wyatt Haste, RN-BSN

## 2013-03-01 NOTE — Telephone Encounter (Signed)
Patient is calling because the minocycline is making her feel really strange and sick and she would really like a different antibiotic.  She has stopped taking the Minocycline.  She said she feels like she is in a glass bubble and even when she gets up to go to the kitchen for some water or to the bathroom, she has to lay down after because she feels so strange.  It has made her bump into walls.

## 2013-03-01 NOTE — Telephone Encounter (Signed)
Stop the doxycycline. I have sent in a prescription for azithromycin for her which she can start today.

## 2013-03-01 NOTE — Telephone Encounter (Signed)
Pt states that she is unable to Minocycline because it makes her feel strange ( see telephone note). Please advise . Wyatt Haste, RN-BSN

## 2013-04-02 ENCOUNTER — Ambulatory Visit (INDEPENDENT_AMBULATORY_CARE_PROVIDER_SITE_OTHER): Payer: BC Managed Care – PPO | Admitting: Family Medicine

## 2013-04-02 ENCOUNTER — Encounter: Payer: Self-pay | Admitting: Family Medicine

## 2013-04-02 VITALS — BP 136/84 | HR 67 | Temp 98.0°F | Ht 61.0 in | Wt 127.0 lb

## 2013-04-02 DIAGNOSIS — J45909 Unspecified asthma, uncomplicated: Secondary | ICD-10-CM

## 2013-04-02 DIAGNOSIS — J4530 Mild persistent asthma, uncomplicated: Secondary | ICD-10-CM

## 2013-04-02 NOTE — Patient Instructions (Addendum)
It was good to see you today.  If the allergy symptoms are bothering you, pick up the nose spray, this should help.  If you need me to send in a new script, just let me know.   Keep taking the claritin.   Keeping doing the flovent 2 times per day and use the albuterol as needed.   Come back in a few months and we'll see how you are doing.

## 2013-04-02 NOTE — Progress Notes (Signed)
S: Pt comes in today for follow up.  ASTHMA Was seen 5/6 for exacerbation.  Currently taking Flovent BID, albuterol PRN (has not needed for several week).  Was told to start flonase and allegra at that appt.  She did not start the flonase, but has been taking claritin.  Feels like she is doing ok, still having some allergy symptoms.  Eyes are running, no itching.  Still having some nasal congestion and drainage.  No fevers.  Occasional shortness of breath and cough- not bad enough to use albuterol.  Feels like flovent is working well.     ROS: Per HPI  History  Smoking status  . Former Smoker  Smokeless tobacco  . Never Used    O:  Filed Vitals:   04/02/13 1108  BP: 136/84  Pulse: 67  Temp: 98 F (36.7 C)    Gen: NAD CV: RRR, no murmur Pulm: CTA bilat, no wheezes or crackles   A/P: 62 y.o. female p/w asthma -See problem list -f/u in 2 months

## 2013-04-02 NOTE — Assessment & Plan Note (Signed)
Generally well controlled.  Cont flovent BID, albuterol PRN.  Taking claritin for allergies, will call if wants nasal spray.

## 2013-05-04 ENCOUNTER — Ambulatory Visit (INDEPENDENT_AMBULATORY_CARE_PROVIDER_SITE_OTHER): Payer: BC Managed Care – PPO | Admitting: Emergency Medicine

## 2013-05-04 ENCOUNTER — Encounter: Payer: Self-pay | Admitting: Emergency Medicine

## 2013-05-04 VITALS — BP 147/84 | HR 60 | Ht 61.0 in | Wt 129.0 lb

## 2013-05-04 DIAGNOSIS — R221 Localized swelling, mass and lump, neck: Secondary | ICD-10-CM

## 2013-05-04 DIAGNOSIS — R220 Localized swelling, mass and lump, head: Secondary | ICD-10-CM

## 2013-05-04 NOTE — Patient Instructions (Addendum)
It was nice to meet you! I think your symptoms may be from allergies.  I don't see anything concerning today. Get some loratadine 10mg  tablets at the drugstore.  Take 1 pill daily for the next 2 weeks. If you are not seeing any improvement after 2 weeks OR that knot gets bigger, you start having fevers, or you have trouble swallowing, please come back.

## 2013-05-04 NOTE — Progress Notes (Signed)
  Subjective:    Patient ID: Diana Roach, female    DOB: [birth year], 62 y.o.   MRN: [id number]  HPI Khalia E Ozbun is here for a SDA for a neck lump.  She reports that for the last 2 months she has been getting a "strangled sensation like when you drink some water and it goes down the windpipe."  This occurs 2-3x a month and is usually on her own spit.  Then, 2 days ago, she noticed a small sore bump in her neck.  Additionally she has felt like she strained her neck and her voice is a little more hoarse.  Has had some clear rhinorrhea as well.  Reports some chills yesterday, but no fevers or nausea/vomiting.  Denies any difficulty or pain with swallowing. She has taken allergy medicine in the past, but is not taking anything right now.    I have reviewed and updated the following as appropriate: allergies and current medications SHx: former smoker  Review of Systems See HPI    Objective:   Physical Exam BP 147/84  Pulse 60  Ht 5\' 1"  (1.549 m)  Wt 129 lb (58.514 kg)  BMI 24.39 kg/m2 Gen: alert, cooperative, NAD HEENT: AT/[place], sclera white, TMs normal bilaterally, nasal mucosa normal, mild cobblestoning of the posterior pharynx without erythema or exudate, thyroid normal, 3mm mobile, mildly tender nodule at base of left neck just medial to SCM insertion; no other LAD     Assessment & Plan:

## 2013-05-04 NOTE — Assessment & Plan Note (Signed)
I suspect this is a small reactive lymph node related to allergies or a viral infection. Allergies could explain her other symptoms as well. No red flags on history or exam like dysphagia, fever, erythema. Will start loratadine 10mg  daily x2 weeks. Reviewed reasons to return as in AVS. F/u in 2 weeks if no better.

## 2013-07-05 ENCOUNTER — Encounter: Payer: Self-pay | Admitting: Family Medicine

## 2013-07-05 ENCOUNTER — Ambulatory Visit (INDEPENDENT_AMBULATORY_CARE_PROVIDER_SITE_OTHER): Payer: BC Managed Care – PPO | Admitting: Family Medicine

## 2013-07-05 VITALS — BP 136/85 | HR 60 | Ht 61.0 in | Wt 132.9 lb

## 2013-07-05 DIAGNOSIS — M25512 Pain in left shoulder: Secondary | ICD-10-CM | POA: Insufficient documentation

## 2013-07-05 DIAGNOSIS — M79602 Pain in left arm: Secondary | ICD-10-CM

## 2013-07-05 DIAGNOSIS — Z0000 Encounter for general adult medical examination without abnormal findings: Secondary | ICD-10-CM

## 2013-07-05 DIAGNOSIS — E785 Hyperlipidemia, unspecified: Secondary | ICD-10-CM

## 2013-07-05 DIAGNOSIS — M79609 Pain in unspecified limb: Secondary | ICD-10-CM

## 2013-07-05 DIAGNOSIS — M25519 Pain in unspecified shoulder: Secondary | ICD-10-CM

## 2013-07-05 HISTORY — DX: Pain in left shoulder: M25.512

## 2013-07-05 LAB — COMPREHENSIVE METABOLIC PANEL
Alkaline Phosphatase: 86 U/L (ref 39–117)
CO2: 26 mEq/L (ref 19–32)
Creat: 0.89 mg/dL (ref 0.50–1.10)
Glucose, Bld: 111 mg/dL — ABNORMAL HIGH (ref 70–99)
Sodium: 139 mEq/L (ref 135–145)
Total Bilirubin: 0.4 mg/dL (ref 0.3–1.2)
Total Protein: 7.2 g/dL (ref 6.0–8.3)

## 2013-07-05 LAB — LIPID PANEL
Cholesterol: 231 mg/dL — ABNORMAL HIGH (ref 0–200)
LDL Cholesterol: 153 mg/dL — ABNORMAL HIGH (ref 0–99)
Total CHOL/HDL Ratio: 4.4 Ratio
Triglycerides: 132 mg/dL (ref <150)
VLDL: 26 mg/dL (ref 0–40)

## 2013-07-05 MED ORDER — IBUPROFEN 600 MG PO TABS: 600.0000 mg | ORAL_TABLET | Freq: Four times a day (QID) | ORAL | Status: DC | PRN

## 2013-07-05 NOTE — Patient Instructions (Addendum)
For the shoulder and arm, take ibuprofen 600mg  every 6 hours as needed for pain. You can also use ice on the elbow and the shoulder. Avoid repetitive over head movement if that is what makes it worst.  I am going to refer you to the sports medicine clinic.   For your regular lab work, we're getting cholesterol and general labs. Make a follow up appointment with Dr. Pollie Meyer in a couple of weeks to discuss the results.

## 2013-07-05 NOTE — Progress Notes (Signed)
Patient ID: Diana Roach    DOB: 09/02/1951, 62 y.o.   MRN: [id number] --- Subjective:  Diana Roach is a 62 y.o.female who presents to same day clinic with left forearm and shoulder pain.  She started having acute pain on Tuesday evening after doing a lot of over head arm work at work. The pain started in mid forearm and traveled to the radial side of the elbow. Pain was sharp shooting. She went home and took 2 aspirins fearing she was having a heart attack. She took hot shower and went to bed without increased pain. Since then, the pain has improved and is now a dull ache. Other than the aspirin, she did not take any medicine for it.  She also reports some associated shoulder pain. She has had chronic issues with her shoulder that have recently flared up.  Pain is worst with above the head movement.    ROS: see HPI Past Medical History: reviewed and updated medications and allergies. Social History: Tobacco: none  Objective: Filed Vitals:   07/05/13 0831  BP: 136/85  Pulse: 60    Physical Examination:   General appearance - alert, well appearing, and in no distress Chest - clear to auscultation, no wheezes, rales or rhonchi, symmetric air entry Heart - normal rate, regular rhythm, normal S1, S2, no murmurs, rubs, clicks or gallops Shoulder - normal range of motion, no soft tissue swelling or joint effusion, positive empty can, some pain with apley's, no point tenderness to palpation, normal strength with forced external and internal rotation  Elbow:left: no lateral or medial epicondyle tenderness. Normal range of motion at elbow. Normal wrist extension and flexion. No reproducible pain with resisted flexion or extension, no tenderness to palpation along forearm or wrist

## 2013-07-05 NOTE — Assessment & Plan Note (Signed)
There could be a component of rotator cuff tendinopathy. She doesn't exhibit any weakness. However, she would likely benefit from being evaluated at the sports medicine clinic with ultrasound to see if there are any significant tears.  It is unclear whether this is the cause of her forearm pain.  For now, treat with NSAIDs, rest and ice until sports medicine evaluation.

## 2013-07-05 NOTE — Assessment & Plan Note (Addendum)
Will obtain lipid panel and CMP today since patient is fasting. She can make appointment with her PCP in a couple of weeks to discuss lab results.

## 2013-07-05 NOTE — Assessment & Plan Note (Signed)
No evidence of tennis elbow that could be causing her pain. She has normal strength and no point tenderness in the arm or elbow joint. Possibility of referred pain from the shoulder.  - patient to be seen by sports medicine for further evaluation - ice, NSAIDs and rest

## 2013-07-06 ENCOUNTER — Encounter: Payer: Self-pay | Admitting: Family Medicine

## 2013-07-13 ENCOUNTER — Ambulatory Visit (INDEPENDENT_AMBULATORY_CARE_PROVIDER_SITE_OTHER): Payer: BC Managed Care – PPO | Admitting: Family Medicine

## 2013-07-13 VITALS — BP 122/79 | Ht 62.0 in | Wt 135.0 lb

## 2013-07-13 DIAGNOSIS — M7580 Other shoulder lesions, unspecified shoulder: Secondary | ICD-10-CM | POA: Insufficient documentation

## 2013-07-13 DIAGNOSIS — M67919 Unspecified disorder of synovium and tendon, unspecified shoulder: Secondary | ICD-10-CM

## 2013-07-13 NOTE — Progress Notes (Signed)
  Subjective:    Patient ID: Diana Roach, female    DOB: 10-22-1951, 62 y.o.   MRN: [id number]  HPI 62 year old female presents with left and right shoulder pain. She reports left shoulder pain to be the most severe. This gradually worsened. Worse with overhead movements. 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 Also lost a lot of overhead heavy lifting. Been worsening her symptoms she denies a specific injury. Has not taken any medications for this. Denies shoulder weakness.  She also has a history of mild chronic neck pain with occasional radicular-type symptoms. Other than a little left lateral neck spasm she is not having neck symptoms at this time and no current radicular symptoms.  Past Medical History  Diagnosis Date  . Asthma   . Lichen planus   . Environmental allergies    Past Surgical History  Procedure Laterality Date  . Back surgery    . Appendectomy        Review of Systems As per history of present illness otherwise negative.    Objective:   Physical Exam BP 122/79  Ht 5\' 2"  (1.575 m)  Wt 135 lb (61.236 kg)  BMI 24.69 kg/m2 Is a well-developed well-nourished 62 year old female awake alert and oriented in no acute distress  Bilateral Shoulder Exam: Inspection reveals no abnormalities, atrophy or asymmetry. Palpation is normal with no tenderness over AC joint or bicipital groove. ROM is full in all planes, but with pain on abduction and internal rotation (l>R) Rotator cuff strength normal throughout. Positive Neer and Hawkin's tests, empty can sign (L>R) Speeds and Yergason's tests normal. No labral pathology noted with negative Obrien's, negative clunk and good stability. Normal scapular function observed. Positive painful arc on left, but no drop arm sign. No apprehension sign  Neurovascularly intact bilateral upper extremities with equal pulses  Muscular skeletal ultrasound: Ultrasound  was performed of the right and left shoulders. Images obtained included longitudinal and transverse views of the bicipital tendon subscapularis tendon supraspinatus tendon and infraspinatus tendon as well as a view of the glenohumeral joint. Ultrasound images were consistent with supraspinatus tendon chronic degenerative tendinopathy most significant in the left shoulder.          Assessment & Plan:

## 2013-07-13 NOTE — Patient Instructions (Addendum)
Thank you for visiting.  Do the exercises as directed for your rotator cuff.  Take the Motrin 600 mg three times daily.  Follow up in 2 months.  Return earlier if you decide you would like to consider the injection or your pain worsens.

## 2013-07-13 NOTE — Assessment & Plan Note (Signed)
Patient was offered an injection in the office today which she declined. She will start home rotator cuff exercises. She'll also take Motrin 600 mg 3 times a day for symptomatically. She'll followup for reevaluation.

## 2013-07-24 ENCOUNTER — Ambulatory Visit: Payer: BC Managed Care – PPO | Admitting: Family Medicine

## 2013-08-08 ENCOUNTER — Ambulatory Visit (INDEPENDENT_AMBULATORY_CARE_PROVIDER_SITE_OTHER): Payer: BC Managed Care – PPO | Admitting: Family Medicine

## 2013-08-08 ENCOUNTER — Encounter: Payer: Self-pay | Admitting: Family Medicine

## 2013-08-08 VITALS — BP 112/73 | HR 66 | Temp 98.2°F | Ht 61.0 in | Wt 134.0 lb

## 2013-08-08 DIAGNOSIS — Z23 Encounter for immunization: Secondary | ICD-10-CM

## 2013-08-08 DIAGNOSIS — J329 Chronic sinusitis, unspecified: Secondary | ICD-10-CM

## 2013-08-08 MED ORDER — AMOXICILLIN-POT CLAVULANATE 875-125 MG PO TABS: 1.0000 | ORAL_TABLET | Freq: Two times a day (BID) | ORAL | Status: DC

## 2013-08-08 NOTE — Patient Instructions (Signed)

## 2013-08-08 NOTE — Progress Notes (Signed)
Family Medicine Office Visit Note   Subjective:   Patient ID: Diana Roach, female  DOB: Feb 17, 1951, 62 y.o.. 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 She reports initially started with a cold and after that she developed nasal congestion and maxillary/frontal sinus pain that has not resolved in 3 weeks. She also reports left ear pain, intermittent, without drainage. She has noticed decreased in her sense of smell.  Patient denies sore throat, fever, chills, nausea, vomiting, or other systemic symptoms.   Objective:   Physical Exam: Gen:  NAD HEENT: Moist mucous membranes. Oropharynx is mildly erythematous without exudates. Tenderness present to palpation of maxillary and frontal sinuses bilaterally, decrease in translucency.  Right ear is  Normal.  Left ear with erythematous bulging TM. No drainage. Mastoid area without erythema or tenderness.  Neck is supple without adenopathy.  CV: Regular rate and rhythm, no murmurs.  PULM: Clear to auscultation bilaterally. No wheezes/rales/rhonchi Neuro: Alert and oriented x3. No focalization  Assessment & Plan:

## 2013-08-08 NOTE — Assessment & Plan Note (Signed)
Present for 3 weeks associates to otitis media. Will start antibiotic coverage with Augmentin per guidelines. Discussed signs of worsening condition that should prompt re-evaluation. Follow up as needed.

## 2013-08-13 ENCOUNTER — Ambulatory Visit (INDEPENDENT_AMBULATORY_CARE_PROVIDER_SITE_OTHER): Payer: BC Managed Care – PPO | Admitting: Family Medicine

## 2013-08-13 ENCOUNTER — Encounter: Payer: Self-pay | Admitting: Family Medicine

## 2013-08-13 VITALS — BP 125/85 | HR 66 | Temp 98.1°F | Ht 61.0 in | Wt 134.5 lb

## 2013-08-13 DIAGNOSIS — E785 Hyperlipidemia, unspecified: Secondary | ICD-10-CM

## 2013-08-13 NOTE — Patient Instructions (Signed)
It was nice to meet you today!  I don't think we need to send you to cardiology right now, but if the chest pain gets worse please let me know. If you have any chest pain that does not go away within 30 minutes, is accompanied by nausea, sweating, shortness of breath, or made worse by activity, go to the emergency room immediately for evaluation.   I think the ear will continue to improve. Please follow up if not better on Monday of next week, or sooner if you are worsening.  See the handout on how to schedule your colonoscopy. This is an important screening test for colon cancer.   See the handout on how to schedule your mammogram. This is an important test to screen for breast cancer.   Be well, Dr. Pollie Meyer

## 2013-08-13 NOTE — Progress Notes (Signed)
Patient ID: MIAROSE LIPPERT, female   DOB: Mar 04, 1951, 62 y.o.   MRN: [id number]  HPI:  Ear infection: Recently treated with Augmentin for an otitis media. Patient reports she doesn't think it is getting any better. She has pain in her left posterior radicular area. She is still taking Augmentin, has enough to last her through this Friday. The congestion is better but the neck & ear are still sore. No fevers. The area behind her ear is not as painful as before but is still sore.  Hyperlipidemia: Patient recently had lipid panel drawn just over a month ago. She is not currently on any lipid modifying agents. On review of systems, she endorses occasional shortness of breath, which she attributes to having asthma. She also endorses occasional left-sided chest pain which occurs about once every 3-4 months. She notices this primarily when breathing in. She thinks the pain is related to gas. It usually lasts about 10 minutes, and is worse with deep inspiration. The last time she had it was 3-4 weeks ago. She also endorses occasional heart palpitations, this also occurs about once every 3-4 months. It slows down after she coughs. It lasts at most one to 2 hours in length. About one to 2 months ago she had an episode of sharp pain and shortness of breath, for which she took an aspirin and came in to clinic the next day. She's never had a stress test before.  ROS: See HPI  PMFSH: Mother had CHF, MI. Sister also has heart disease.  PHYSICAL EXAM: BP 125/85  Pulse 66  Temp(Src) 98.1 F (36.7 C)  Ht 5\' 1"  (1.549 m)  Wt 134 lb 8 oz (61.009 kg)  BMI 25.43 kg/m2 Gen: No acute distress pleasant and cooperative HEENT: TMs clear bilaterally, left TM with slight clear air-fluid level, but no erythema or bulging of the membrane. External canal normal in appearance. Mild tenderness present over the left mastoid area, but no erythema or swelling noted. No appreciable cervical lymphadenopathy. Heart: Regular rate and  rhythm, no murmurs Lungs: Clear to auscultation bilaterally, normal respiratory effort Neuro: Nonfocal, speech intact  ASSESSMENT/PLAN:  # Health maintenance: -Handout given on how to schedule mammogram and colonoscopy. -Will defer flu shot to a future visit as patient is currently undergoing treatment with antibiotics for ear infection.  # Ear infection: No signs of active infection. Instructed patient to complete the full 10 days worth of Augmentin. Symptoms and exam not currently consistent with mastoiditis. Precepted with Dr. Mauricio Po who also examined patient and agrees with this plan.  # Palpitations and occasional chest pain: History not consistent with angina. These both occur rarely (once every 3-4 months) and seemed to be self-limited. Advised that if patient has any worsening chest pain or problems that we can always reevaluate her. Given chest pain return precautions per AVS.  See problem based charting for additional assessment/plan.  FOLLOW UP: F/u in 3 months for routine medical problems.  [place] J. Pollie Meyer, MD [facility]

## 2013-08-17 NOTE — Assessment & Plan Note (Addendum)
LDL elevated at 153, however Framingham risk score is 3%. No indication for statin at this time. Discussed results with patient.

## 2013-08-27 ENCOUNTER — Encounter: Payer: Self-pay | Admitting: Internal Medicine

## 2013-09-07 ENCOUNTER — Ambulatory Visit: Payer: BC Managed Care – PPO | Admitting: Family Medicine

## 2013-09-24 ENCOUNTER — Ambulatory Visit (INDEPENDENT_AMBULATORY_CARE_PROVIDER_SITE_OTHER): Payer: BC Managed Care – PPO | Admitting: *Deleted

## 2013-09-24 DIAGNOSIS — Z23 Encounter for immunization: Secondary | ICD-10-CM

## 2013-10-23 ENCOUNTER — Ambulatory Visit (AMBULATORY_SURGERY_CENTER): Payer: Self-pay

## 2013-10-23 VITALS — Ht 61.0 in | Wt 135.0 lb

## 2013-10-23 DIAGNOSIS — Z1211 Encounter for screening for malignant neoplasm of colon: Secondary | ICD-10-CM

## 2013-10-23 MED ORDER — SUPREP BOWEL PREP KIT 17.5-3.13-1.6 GM/177ML PO SOLN: 1.0000 | Freq: Once | ORAL | Status: DC

## 2013-10-24 ENCOUNTER — Encounter: Payer: Self-pay | Admitting: Internal Medicine

## 2013-10-25 HISTORY — PX: COLONOSCOPY: SHX174

## 2013-11-05 ENCOUNTER — Telehealth: Payer: Self-pay | Admitting: Internal Medicine

## 2013-11-05 NOTE — Telephone Encounter (Signed)
she should cancel, no charge

## 2013-11-06 ENCOUNTER — Encounter: Payer: BC Managed Care – PPO | Admitting: Internal Medicine

## 2013-11-08 ENCOUNTER — Ambulatory Visit (INDEPENDENT_AMBULATORY_CARE_PROVIDER_SITE_OTHER): Payer: BC Managed Care – PPO | Admitting: Family Medicine

## 2013-11-08 ENCOUNTER — Encounter: Payer: Self-pay | Admitting: Family Medicine

## 2013-11-08 VITALS — BP 156/96 | HR 79 | Temp 98.4°F | Ht 61.0 in | Wt 133.0 lb

## 2013-11-08 DIAGNOSIS — J209 Acute bronchitis, unspecified: Secondary | ICD-10-CM

## 2013-11-08 MED ORDER — AZITHROMYCIN 250 MG PO TABS: ORAL_TABLET | ORAL | Status: DC

## 2013-11-08 NOTE — Progress Notes (Signed)
Subjective:     Patient ID: Diana Roach, female   DOB: 1951-07-04, 63 y.o.   MRN: [id number]  HPI  URI SYMPTOMS / BRONCHITIS Reports constellation of URI symptoms started Friday and then worsened by Sunday. Today presents without any significant relief in symptoms, primarily productive cough (green sputum), nasal congestion and drainage, subjective fever with some chills. Also noted episode of nose bleed after blowing nose, since resolved. Recent history of sinusitis back in October 2014 treated with antibiotics, but has significantly improved since then, however does note intermittent congestion/cough over past month. Currently good appetite and trying to stay well hydrated. Tried only Goody Powder x 1 last night. No tylenol or any OTC cold medicines. Multiple sick contacts at home and work, with similar symptoms. - Concern about upcoming Colonoscopy, previously scheduled for 11/06/13 advised to cancel d/t illness, has been rescheduled for Monday 11/12/13.  Social Hx: Former smoker  Review of Systems  See above HPI  Denies any HA, CP, dyspnea, wheezing, abdominal pain, nausea / vomiting, diarrhea, or weakness.      Objective:   Physical Exam  BP 156/96  Pulse 79  Temp(Src) 98.4 F (36.9 C) (Oral)  Ht 5\' 1"  (1.549 m)  Wt 133 lb (60.328 kg)  BMI 25.14 kg/m2  Gen - tired but well-appearing, NAD HEENT - PERRL, EOMI, patent nares with mild congestion, no facial (frontal/maxillary) tenderness, normal transillumination, oropharynx mild generalized erythema, no edema, +post-nasal drip, b/l TM's clear without erythema, MMM Neck - supple, non-tender, no significant LAD Heart - RRR, no murmurs heard Lungs - CTAB, no wheezing, crackles, or rhonchi. Normal work of breathing. Skin - warm, dry, no rashes Neuro - awake, alert, oriented     Assessment:     See specific A&P problem list for details.      Plan:     See specific A&P problem list for details.

## 2013-11-08 NOTE — Assessment & Plan Note (Addendum)
Clinically suggestive of acute bronchitis, suspect viral URI worsened to potential bronchitis (worsened >7 days, and recent history of similar symptoms within past 1-2 months), +inc cough / thick productive sputum, +sick contacts, +former smoker  Plan: 1. Azithro Z-pack x 5 days, elected for antibiotics given worsened course in setting of upcoming colonoscopy 2. Recommended saline nasal spray, OTC symptomatic management, hydration 3. Advised to call GI office tomorrow, clarify that she is still able to get procedure if improved by Monday

## 2013-11-08 NOTE — Patient Instructions (Addendum)
Dear Colon Flattery, Thank you for coming in to clinic today. It was good to meet you!  Today we discussed your recent illness. 1. It sounds like you may be developing bronchitis from a persistent viral infection. I have sent in a prescription for a Z-pack (Azithromycin), please take 2 tablets (500mg ) on Day 1, and then 1 tablet (250mg ) each of the following days (Day 2 through 5). 2. Also, recommend to get a Simply Saline nose spray over the counter to help flush your nasal passages. 3. Please call your GI doctor's office tomorrow to clarify that you will be okay for the procedure on Monday. 4. You may take Tylenol for any fever/chills. Also, additional over the counter cold medicines may be of some help, however be cautious of decongestants due to your recently elevated blood pressure.  Some important numbers from today's visit: BP - 156/96 Results -  Please schedule a follow-up appointment with [name] or any available doctor within 1-2 weeks if your symptoms are not improving.  If you have any other questions or concerns, please feel free to call the clinic to contact me. You may also schedule an earlier appointment if necessary.  However, if your symptoms get significantly worse, please go to the Emergency Department to seek immediate medical attention.  Nobie Putnam, [place]

## 2013-11-12 ENCOUNTER — Ambulatory Visit (AMBULATORY_SURGERY_CENTER): Payer: BC Managed Care – PPO | Admitting: Internal Medicine

## 2013-11-12 ENCOUNTER — Encounter: Payer: Self-pay | Admitting: Internal Medicine

## 2013-11-12 VITALS — BP 128/76 | HR 67 | Temp 97.7°F | Resp 16 | Ht 61.0 in | Wt 135.0 lb

## 2013-11-12 DIAGNOSIS — D126 Benign neoplasm of colon, unspecified: Secondary | ICD-10-CM

## 2013-11-12 DIAGNOSIS — Z1211 Encounter for screening for malignant neoplasm of colon: Secondary | ICD-10-CM

## 2013-11-12 DIAGNOSIS — K5730 Diverticulosis of large intestine without perforation or abscess without bleeding: Secondary | ICD-10-CM

## 2013-11-12 MED ORDER — SODIUM CHLORIDE 0.9 % IV SOLN: 500.0000 mL | INTRAVENOUS | Status: DC

## 2013-11-12 NOTE — Op Note (Signed)
[place]  [address] [place], [id number]   COLONOSCOPY PROCEDURE REPORT  PATIENT: Diana Roach, Diana Roach  MR#: [id number] BIRTHDATE: [birth year] , 62  yrs. old GENDER: Female ENDOSCOPIST: Gatha Mayer, MD, [facility] REFERRED BY:   Dr. Chrisandra Netters PROCEDURE DATE:  11/12/2013 PROCEDURE:   Colonoscopy with snare polypectomy First Screening Colonoscopy - Avg.  risk and is 50 yrs.  old or older Yes.  Prior Negative Screening - Now for repeat screening. N/A  History of Adenoma - Now for follow-up colonoscopy & has been > or = to 3 yrs.  N/A  Polyps Removed Today? Yes. ASA CLASS:   Class II INDICATIONS:average risk screening and first colonoscopy. MEDICATIONS: Propofol (Diprivan) 180 mg IV, MAC sedation, administered by CRNA, and These medications were titrated to patient response per physician's verbal order  DESCRIPTION OF PROCEDURE:   After the risks benefits and alternatives of the procedure were thoroughly explained, informed consent was obtained.  A digital rectal exam revealed no abnormalities of the rectum.   The [id number]  endoscope was introduced through the anus and advanced to the cecum, which was identified by both the appendix and ileocecal valve. No adverse events experienced.   The quality of the prep was excellent using Suprep  The instrument was then slowly withdrawn as the colon was fully examined.  COLON FINDINGS: A diminutive sessile polyp was found in the sigmoid colon.  A polypectomy was performed with a cold snare.  The resection was complete and the polyp tissue was completely retrieved.   Moderate diverticulosis was noted in the sigmoid colon.   The colon mucosa was otherwise normal.   A right colon retroflexion was performed.  Retroflexed views revealed no abnormalities. The time to cecum=1 minutes 58 seconds.  Withdrawal time=9 minutes 24 seconds.  The scope was withdrawn and the procedure completed. COMPLICATIONS: There were  no complications.  ENDOSCOPIC IMPRESSION: 1.   Diminutive sessile polyp was found in the sigmoid colon; polypectomy was performed with a cold snare 2.   Moderate diverticulosis was noted in the sigmoid colon 3.   The colon mucosa was otherwise normal  RECOMMENDATIONS: Timing of repeat colonoscopy will be determined by pathology findings.   eSigned:  Gatha Mayer, MD, [facility] 11/12/2013 10:07 AM   cc: The Patient  and Dr. Chrisandra Netters

## 2013-11-12 NOTE — Progress Notes (Signed)
A/ox3 pleased with MAC, report to Annette RN 

## 2013-11-12 NOTE — Progress Notes (Signed)
Called to room to assist during endoscopic procedure.  Patient ID and intended procedure confirmed with present staff. Received instructions for my participation in the procedure from the performing physician.  

## 2013-11-12 NOTE — Progress Notes (Signed)
No complaints noted in the recovery room. Maw   

## 2013-11-12 NOTE — Patient Instructions (Addendum)
I found and removed one tiny polyp that looks benign.  You also have a condition called diverticulosis - common and not usually a problem. Please read the handout provided.  I will let you know pathology results and when to have another routine colonoscopy by mail.  I appreciate the opportunity to care for you. Gatha Mayer, MD, FACG    YOU HAD AN ENDOSCOPIC PROCEDURE TODAY AT [facility]: Refer to the procedure report that was given to you for any specific questions about what was found during the examination.  If the procedure report does not answer your questions, please call your gastroenterologist to clarify.  If you requested that your care partner not be given the details of your procedure findings, then the procedure report has been included in a sealed envelope for you to review at your convenience later.  YOU SHOULD EXPECT: Some feelings of bloating in the abdomen. Passage of more gas than usual.  Walking can help get rid of the air that was put into your GI tract during the procedure and reduce the bloating. If you had a lower endoscopy (such as a colonoscopy or flexible sigmoidoscopy) you may notice spotting of blood in your stool or on the toilet paper. If you underwent a bowel prep for your procedure, then you may not have a normal bowel movement for a few days.  DIET: Your first meal following the procedure should be a light meal and then it is ok to progress to your normal diet.  A half-sandwich or bowl of soup is an example of a good first meal.  Heavy or fried foods are harder to digest and may make you feel nauseous or bloated.  Likewise meals heavy in dairy and vegetables can cause extra gas to form and this can also increase the bloating.  Drink plenty of fluids but you should avoid alcoholic beverages for 24 hours.  ACTIVITY: Your care partner should take you home directly after the procedure.  You should plan to take it easy, moving slowly for the rest of  the day.  You can resume normal activity the day after the procedure however you should NOT DRIVE or use heavy machinery for 24 hours (because of the sedation medicines used during the test).    SYMPTOMS TO REPORT IMMEDIATELY: A gastroenterologist can be reached at any hour.  During normal business hours, 8:30 AM to 5:00 PM Monday through Friday, call [phone].  After hours and on weekends, please call the GI answering service at [phone] who will take a message and have the physician on call contact you.   Following lower endoscopy (colonoscopy or flexible sigmoidoscopy):  Excessive amounts of blood in the stool  Significant tenderness or worsening of abdominal pains  Swelling of the abdomen that is new, acute  Fever of 100F or higher  Following upper endoscopy (EGD)  Vomiting of blood or coffee ground material  New chest pain or pain under the shoulder blades  Painful or persistently difficult swallowing  New shortness of breath  Fever of 100F or higher  Black, tarry-looking stools  FOLLOW UP: If any biopsies were taken you will be contacted by phone or by letter within the next 1-3 weeks.  Call your gastroenterologist if you have not heard about the biopsies in 3 weeks.  Our staff will call the home number listed on your records the next business day following your procedure to check on you and address any questions or concerns that  you may have at that time regarding the information given to you following your procedure. This is a courtesy call and so if there is no answer at the home number and we have not heard from you through the emergency physician on call, we will assume that you have returned to your regular daily activities without incident.  SIGNATURES/CONFIDENTIALITY: You and/or your care partner have signed paperwork which will be entered into your electronic medical record.  These signatures attest to the fact that that the information above on your After Visit  Summary has been reviewed and is understood.  Full responsibility of the confidentiality of this discharge information lies with you and/or your care-partner.    Per Dr. Carlean Purl you may increase miralax to twice a day if needed per Dr. Carlean Purl. Handouts were given to your care partner on polyps and diverticulosis. You may resume your current medications today. Please call if any questions or concerns.

## 2013-11-13 ENCOUNTER — Telehealth: Payer: Self-pay | Admitting: *Deleted

## 2013-11-13 NOTE — Telephone Encounter (Signed)
  Follow up Call-  Call back number 11/12/2013  Post procedure Call Back phone  # [phone]  Permission to leave phone message Yes     Patient questions:  Do you have a fever, pain , or abdominal swelling? no Pain Score  0 *  Have you tolerated food without any problems? yes  Have you been able to return to your normal activities? yes  Do you have any questions about your discharge instructions: Diet   no Medications  no Follow up visit  no  Do you have questions or concerns about your Care? no  Actions: * If pain score is 4 or above: No action needed, pain <4.

## 2013-11-15 ENCOUNTER — Encounter: Payer: Self-pay | Admitting: Internal Medicine

## 2013-11-15 NOTE — Progress Notes (Signed)
Quick Note:  Not a polyp - repeat colon 2025 ______

## 2014-02-14 ENCOUNTER — Ambulatory Visit (INDEPENDENT_AMBULATORY_CARE_PROVIDER_SITE_OTHER): Payer: BC Managed Care – PPO | Admitting: Emergency Medicine

## 2014-02-14 VITALS — BP 129/85 | HR 57 | Temp 97.8°F | Ht 61.0 in | Wt 131.0 lb

## 2014-02-14 DIAGNOSIS — H9202 Otalgia, left ear: Secondary | ICD-10-CM

## 2014-02-14 DIAGNOSIS — H9209 Otalgia, unspecified ear: Secondary | ICD-10-CM

## 2014-02-14 MED ORDER — FLUTICASONE PROPIONATE 50 MCG/ACT NA SUSP: 2.0000 | Freq: Every day | NASAL | Status: DC

## 2014-02-14 NOTE — Assessment & Plan Note (Signed)
No signs of infection or TMJ pain. No obvious fluid behind the ear. Will treat her allergies with claritin and flonase and see if that helps. F/u in 2-4 weeks, sooner if worsening/changing.  May need to consider referral to ENT.

## 2014-02-14 NOTE — Progress Notes (Signed)
   Subjective:    Patient ID: Diana Roach, female    DOB: 02-20-1951, 63 y.o.   MRN: [id number]  HPI Diana Roach is here for a SDA for left ear pain.  She states her left ear has been bothering her off and on for about 2 weeks.  States pain is located down in the ear.  Over the last few days, she states the pain will radiate down the side of her neck.  She reports some left neck swelling last night which has resolved this morning.  Also reports muffled hearing on the left.  No association with chewing.  She does have a lot of nasal congestion and allergy symptoms. She is not currently taking anything for this.    Current Outpatient Prescriptions on File Prior to Visit  Medication Sig Dispense Refill  . albuterol (PROVENTIL HFA) 108 (90 BASE) MCG/ACT inhaler Inhale 2 puffs into the lungs every 4 (four) hours as needed. For wheezing.  1 Inhaler  0  . Aspirin-Acetaminophen-Caffeine (GOODY HEADACHE PO) Take by mouth.      . fluticasone (FLOVENT HFA) 220 MCG/ACT inhaler Inhale 1 puff into the lungs 2 (two) times daily.      . polyethylene glycol (MIRALAX / GLYCOLAX) packet Take 17 g by mouth as needed. Once or twice weekly as needed      . azithromycin (ZITHROMAX Z-PAK) 250 MG tablet Take 500mg  (2 tablets on Day 1), and then 250mg  (1 tablet) Day 2 through Day 5  6 each  0  . ibuprofen (ADVIL,MOTRIN) 600 MG tablet Take 1 tablet (600 mg total) by mouth every 6 (six) hours as needed for pain. No more than 4-5 days  30 tablet  0  . loratadine (CLARITIN) 10 MG tablet Take 1 tablet (10 mg total) by mouth daily.  30 tablet  6   No current facility-administered medications on file prior to visit.    I have reviewed and updated the following as appropriate: allergies and current medications SHx: former smoker   Review of Systems See HPI    Objective:   Physical Exam BP 129/85  Pulse 57  Temp(Src) 97.8 F (36.6 C) (Oral)  Ht 5\' 1"  (1.549 m)  Wt 131 lb (59.421 kg)  BMI 24.76 kg/m2 Gen:  alert, cooperative, NAD HEENT: AT/[place], sclera white, MMM, no pharyngeal erythema or exudate; nasal mucosa mildly erythematous; TMs normal bilaterally; no TMJ tenderness or tenderness with manipulation of tragus Neck: supple, no LAD, no swelling noted      Assessment & Plan:

## 2014-02-14 NOTE — Patient Instructions (Signed)
It was nice to see you!  I'm not sure why your ear is hurting, but there is no sign of infection or anything bad going on. It may be related to your allergies and the congestion you are experiencing.  Please start the Claritin again. I sent in Flonase.  Use it every day.  Follow up in 2-4 weeks if it is not improving.  Come back sooner if something changes or gets worse.

## 2014-04-03 ENCOUNTER — Telehealth: Payer: Self-pay | Admitting: Internal Medicine

## 2014-04-03 ENCOUNTER — Telehealth: Payer: Self-pay | Admitting: *Deleted

## 2014-04-03 NOTE — Telephone Encounter (Signed)
Pt called regarding her Diverticulitis is flaring up and she went to the urgent care in [place] on Monday.  She was given some antibiotics, but her bowels has not moved in about 2 days.  Pt also complained of abdominal/ back pain.   Pt advised to increase fluids, use a heating pad on abdomen/back, and take tylenol for pain.  Pt has taken two doses of miralax this morning with no relief.  Pt told to go back to urgent care in [place] because there were no more appt at [facility] today.  Pt stated understanding.  Will forward to PCP for further advise.  Derl Barrow, RN

## 2014-04-03 NOTE — Telephone Encounter (Signed)
Patient reports she is being treated by urgent care in [place] for diverticulitis.  She was started on cipro and flagyl.  She is still having LLQ that she attributes to constipation mostly.  She has been taking Miralax BID with only very minimal results.  She is advised to increase her Miralax to 3-4 times a day making sure each dose is taken with 8 oz of fluid.  She is also advised to increase the amount of fluid she is drinking throughout the day.  She has been on a low residue diet since Monday.  She will call back tomorrow if she does not have any results for eval with APP or additional advise

## 2014-04-04 NOTE — Telephone Encounter (Signed)
Agree with above recommendations to go back to urgent care. Pt needs to be seen and re-evaluated for her symptoms.  Leeanne Rio, MD

## 2014-04-05 ENCOUNTER — Telehealth: Payer: Self-pay | Admitting: Internal Medicine

## 2014-04-05 ENCOUNTER — Encounter: Payer: Self-pay | Admitting: *Deleted

## 2014-04-05 NOTE — Telephone Encounter (Signed)
See phone note from 04/03/14, patient was treated at an Urgent care for diverticulitis.  She has not had much improvement in her pain.  She states that when she wakes up in the am she has terrible pain, but it does improve through out the day.  She is given an appt for Tuesday with Tye Savoy RNP.  She is advised that I do not have any appts today or Monday.  She is encouraged to contact the Urgent care that saw her on Monday until her appt here next week.

## 2014-04-09 ENCOUNTER — Encounter: Payer: Self-pay | Admitting: Nurse Practitioner

## 2014-04-09 ENCOUNTER — Ambulatory Visit (INDEPENDENT_AMBULATORY_CARE_PROVIDER_SITE_OTHER): Payer: BC Managed Care – PPO | Admitting: Nurse Practitioner

## 2014-04-09 VITALS — BP 118/70 | HR 66 | Ht 61.0 in | Wt 131.2 lb

## 2014-04-09 DIAGNOSIS — R52 Pain, unspecified: Secondary | ICD-10-CM

## 2014-04-09 DIAGNOSIS — R109 Unspecified abdominal pain: Secondary | ICD-10-CM

## 2014-04-09 MED ORDER — METRONIDAZOLE 500 MG PO TABS: 500.0000 mg | ORAL_TABLET | Freq: Three times a day (TID) | ORAL | Status: AC

## 2014-04-09 MED ORDER — CIPROFLOXACIN HCL 500 MG PO TABS: 500.0000 mg | ORAL_TABLET | Freq: Two times a day (BID) | ORAL | Status: AC

## 2014-04-09 NOTE — Progress Notes (Signed)
     History of Present Illness:  Patient is a 63 year old female known to Dr. Carlean Purl from screening colonoscopy January of this year . Patient here for evaluation of abdominal pain. She was seen at an urgent care clinic several days ago for lower abdominal pain. Pain constant, unrelated to meals. It  Is LLQ / lower mid abdomen radiating around to lower back. She was given 7 days of Cipro / flagyl (?empirically for diverticulitis). She is definitely feeling better Patient states she had diverticulitis some years back and this felt the same. Though feeling better she still has lower abdominal discomfort. She has chills but no fever. No urinary symptoms. Patient takes Miralax as needed for chronic constipation.    Current Medications, Allergies, Past Medical History, Past Surgical History, Family History and Social History were reviewed in [employer] record.  Physical Exam: General: Pleasant, well developed , white female in no acute distress Head: Normocephalic and atraumatic Eyes:  sclerae anicteric, conjunctiva pink  Ears: Normal auditory acuity Lungs: Clear throughout to auscultation Heart: Regular rate and rhythm Abdomen: Soft, non distended, mild-mod mid lower tenderness.  No masses, no hepatomegaly. Normal bowel sounds Musculoskeletal: Symmetrical with no gross deformities  Extremities: No edema  Neurological: Alert oriented x 4, grossly nonfocal Psychological:  Alert and cooperative. Normal mood and affect  Assessment and Recommendations:  63 year old female with LLQ pain and known history of diverticulosis. Pain improved but not resolved after a week of Cipro / flagyl. Will extend antibiotics for another 7 day. If not better patient will call back at which time she may need imaging.

## 2014-04-09 NOTE — Patient Instructions (Signed)
We sent another 7 days of Cipro and Metronidazole ( Flagyl) to [facility].   Use a low fiber diet and once you are feeling better you can gradually increase to a nomal diet with fiber.    We have given you a work note to be out the rest of the week. If you are not better after the antibiotics call us and talk to [facility] , a nurse, and we may consider a cat scan.

## 2014-04-10 ENCOUNTER — Encounter: Payer: Self-pay | Admitting: Nurse Practitioner

## 2014-04-11 NOTE — Progress Notes (Signed)
Agree with Ms. Vanita Ingles treatment of what we think is diverticulitis. Gatha Mayer, MD, [facility]

## 2014-04-16 ENCOUNTER — Telehealth: Payer: Self-pay | Admitting: Nurse Practitioner

## 2014-04-16 DIAGNOSIS — R109 Unspecified abdominal pain: Secondary | ICD-10-CM

## 2014-04-16 NOTE — Telephone Encounter (Signed)
Patient calling to report she will complete her antibiotics today. She is still having pain in the bottom of her stomach and bloating. She is asking what is next for her to do. Please, advise.

## 2014-04-17 NOTE — Telephone Encounter (Signed)
Diana Roach, please try and determine if there is any way that she is just constipated. Any urinary or vaginal symptoms. If not then we can proceed with a CTscan of abd/pelvis with contrast as her last one was in 2007. Not sure why she is not getting better on antibiotics if this is diverticulitis. Thanks

## 2014-04-17 NOTE — Telephone Encounter (Signed)
Spoke with patient and she is taking Miralax and is not constipated. No urinary or vaginal symptoms. She states she feels better but still has "pain in the bottom of my belly to the right side."

## 2014-04-18 ENCOUNTER — Telehealth: Payer: Self-pay | Admitting: *Deleted

## 2014-04-18 NOTE — Telephone Encounter (Signed)
[facility] called and the radiologist will not do CT with IV contrast due to previous reaction. Notified Tye Savoy, NP. Per Tye Savoy, NP, have patient come next week for OV. Would have to have MRI if not better next week and will need to get cleared with insurance. Scheduled patient on 04/23/14 at 2:00 PM with Tye Savoy, NP. Patient aware.

## 2014-04-18 NOTE — Telephone Encounter (Signed)
Spoke with patient and she is having diarrhea with mucous, swelling in abdomen and abdominal pain. She does want to have a CT because "something is not right."  Patient has a contrast allergy. Spoke with Tye Savoy, NP and she wants the CT with contrast. Left a message for Rose to call me. Rose states they want her to have CT at hospital. Spoke with scheduling at the hospital. They are going to discuss with radiologist then call back.

## 2014-04-18 NOTE — Telephone Encounter (Signed)
Patient is calling to see if we decided she needs a CT. Please, advise.

## 2014-04-18 NOTE — Telephone Encounter (Signed)
Diana Roach, if slowly getting better then she could wait on getting a CTScan. If pain persists, or gets worse then definitely get a CTscan abd/pelvis with IV contrast Thanks

## 2014-04-19 NOTE — Telephone Encounter (Signed)
See previous phone note.  

## 2014-04-23 ENCOUNTER — Ambulatory Visit (INDEPENDENT_AMBULATORY_CARE_PROVIDER_SITE_OTHER): Payer: BC Managed Care – PPO | Admitting: Nurse Practitioner

## 2014-04-23 ENCOUNTER — Telehealth: Payer: Self-pay | Admitting: Family Medicine

## 2014-04-23 ENCOUNTER — Encounter: Payer: Self-pay | Admitting: Nurse Practitioner

## 2014-04-23 VITALS — BP 110/70 | HR 76 | Ht 61.0 in | Wt 133.4 lb

## 2014-04-23 DIAGNOSIS — R109 Unspecified abdominal pain: Secondary | ICD-10-CM

## 2014-04-23 DIAGNOSIS — R1030 Lower abdominal pain, unspecified: Secondary | ICD-10-CM

## 2014-04-23 NOTE — Telephone Encounter (Signed)
Need to speak with provider regarding suggestion to see a gyn doctor.

## 2014-04-23 NOTE — Progress Notes (Signed)
     History of Present Illness:  Patient is a 63 year old female who I saw a almost two weeks ago for LLQ pain.  She was seen at an urgent care clinic several prior, treated empirically for diverticulitis. Though pain improved it had not resolve. I gave her another 7 days of antibiotics. LLQ pain improved further with second course of antibiotics but patient comes in today with residual RLQ discomfort. No urinary or vaginal symptoms. Last GYN exam approximately 2 years ago.    Current Medications, Allergies, Past Medical History, Past Surgical History, Family History and Social History were reviewed in [employer] record.   Physical Exam: General: Pleasant, well developed , white female in no acute distress Head: Normocephalic and atraumatic Eyes:  sclerae anicteric, conjunctiva pink  Ears: Normal auditory acuity Lungs: Clear throughout to auscultation Heart: Regular rate and rhythm Abdomen: Soft, non distended, mild RLQ tenderness.  No masses, no hepatomegaly. Normal bowel sounds Musculoskeletal: Symmetrical with no gross deformities  Extremities: No edema  Neurological: Alert oriented x 4, grossly nonfocal Psychological:  Alert and cooperative. Normal mood and affect  Assessment and Recommendations:  54. 63 year old female recently treated empirically for diverticulitis. Her radiating mid lower and LLQ pain have nearly resolved after antibiotics but now having discomfort in RLQ which I am unclear about whether this was part of original pain. We had scheduled her for a CTscan but due to contrast allergy and hypotension during a scan several years ago we did not pursue the study. I would like patient to get GYN evaluation since her last one was a couple of years ago. She does localize pain near old appendectomy site so adhesions possible. No colonoscopy needed, she just had one in January of this year.

## 2014-04-23 NOTE — Telephone Encounter (Signed)
[place] red team, can you please call pt and ask what her concern is? I have not seen her in a while and if she has a complex concern she will need to schedule an office visit with me to discuss.  Leeanne Rio, MD

## 2014-04-23 NOTE — Patient Instructions (Addendum)
Titrate Miralax to once a day to every other day.  Need to have a bowel movement three times a week.  Please make an appointment with your primary care provider. They will see Hessie Diener, NP notes in your chart.

## 2014-04-24 DIAGNOSIS — R109 Unspecified abdominal pain: Secondary | ICD-10-CM

## 2014-04-24 HISTORY — DX: Unspecified abdominal pain: R10.9

## 2014-04-24 NOTE — Telephone Encounter (Signed)
Called patient and she states that her diverticulitis cleared up but that she is still having pain in her lower right quadrant. She states that the gastroenterologist told her to see a gynecologist because it may be her ovaries. Her pain level is a 5 when she presses abdomen and it is dull. It is recurrent  but not constant. She is going to schedule an appointment for a pap. Thanks, [employer]

## 2014-04-30 ENCOUNTER — Other Ambulatory Visit: Payer: Self-pay | Admitting: Family Medicine

## 2014-04-30 ENCOUNTER — Ambulatory Visit (INDEPENDENT_AMBULATORY_CARE_PROVIDER_SITE_OTHER): Payer: BC Managed Care – PPO | Admitting: Family Medicine

## 2014-04-30 ENCOUNTER — Encounter: Payer: Self-pay | Admitting: Family Medicine

## 2014-04-30 ENCOUNTER — Other Ambulatory Visit (HOSPITAL_COMMUNITY)
Admission: RE | Admit: 2014-04-30 | Discharge: 2014-04-30 | Disposition: A | Discharge status: Home / Self Care | Payer: BC Managed Care – PPO | Source: Ambulatory Visit | Attending: Family Medicine | Admitting: Family Medicine

## 2014-04-30 VITALS — BP 126/86 | HR 64 | Ht 61.0 in | Wt 132.0 lb

## 2014-04-30 DIAGNOSIS — Z01419 Encounter for gynecological examination (general) (routine) without abnormal findings: Secondary | ICD-10-CM | POA: Insufficient documentation

## 2014-04-30 DIAGNOSIS — Z124 Encounter for screening for malignant neoplasm of cervix: Secondary | ICD-10-CM

## 2014-04-30 DIAGNOSIS — Z1151 Encounter for screening for human papillomavirus (HPV): Secondary | ICD-10-CM | POA: Insufficient documentation

## 2014-04-30 DIAGNOSIS — R1031 Right lower quadrant pain: Secondary | ICD-10-CM

## 2014-04-30 LAB — CBC WITH DIFFERENTIAL/PLATELET
BASOS ABS: 0.1 10*3/uL (ref 0.0–0.1)
BASOS PCT: 1 % (ref 0–1)
EOS ABS: 0.6 10*3/uL (ref 0.0–0.7)
Eosinophils Relative: 6 % — ABNORMAL HIGH (ref 0–5)
HCT: 36.6 % (ref 36.0–46.0)
HEMOGLOBIN: 12.7 g/dL (ref 12.0–15.0)
Lymphocytes Relative: 27 % (ref 12–46)
Lymphs Abs: 2.8 10*3/uL (ref 0.7–4.0)
MCH: 26.2 pg (ref 26.0–34.0)
MCHC: 34.7 g/dL (ref 30.0–36.0)
MCV: 75.6 fL — ABNORMAL LOW (ref 78.0–100.0)
Monocytes Absolute: 0.6 10*3/uL (ref 0.1–1.0)
Monocytes Relative: 6 % (ref 3–12)
NEUTROS ABS: 6.3 10*3/uL (ref 1.7–7.7)
NEUTROS PCT: 60 % (ref 43–77)
PLATELETS: 397 10*3/uL (ref 150–400)
RBC: 4.84 MIL/uL (ref 3.87–5.11)
RDW: 16.5 % — AB (ref 11.5–15.5)
WBC: 10.5 10*3/uL (ref 4.0–10.5)

## 2014-04-30 MED ORDER — FLUTICASONE PROPIONATE HFA 220 MCG/ACT IN AERO: 1.0000 | INHALATION_SPRAY | Freq: Two times a day (BID) | RESPIRATORY_TRACT | Status: DC

## 2014-04-30 NOTE — Progress Notes (Signed)
Patient ID: Diana Roach, female   DOB: 12-08-1950, 63 y.o.   MRN: [id number]  HPI:  Pt presents today to discuss RLQ pain. She was recently treated by urgent care and her GI doctor for diverticulitis with cipro & flagyl. At that time, had been experiencing LLQ pain which is now improved after being treated. Her GI doctor requested she have a GYN evaluation for her RLQ pain. She has a hx of an appendectomy.  Pt reports she's had RLQ pain for the past few weeks. No fevers, vomiting, vaginal discharge, vaginal bleeding, dysuria. Had just a small amount of tan tinted material on her underwear. States she stays constipated but miralax helps, this is a chronic issue. The pain is worse when she lays down, or moves, or pulls her leg up to her chest. No unintentional weight loss. Has not been sexually active in over 13 years since her husband died.  Pt also would like to discuss ear pain (L ear hurt yesterday) and a puffy feeling in her left supraclavicular area. Advised we need to discuss this at a separate visit.  ROS: See HPI  [place]: hx stress incontinence, lichen planus  PHYSICAL EXAM: BP 126/86  Pulse 64  Ht 5\' 1"  (1.549 m)  Wt 132 lb (59.875 kg)  BMI 24.95 kg/m2 Gen: NAD HEENT: NCAT, L TM without erythema or bulging. No supraclavicular lymphadenopathy, L supraclavicular area normal in appearance Heart: RRR Lungs: CTAB Abdomen: soft, no masses or organomegaly. TTP RLQ. No rebound or guarding, no peritoneal signs. Neuro: grossly nonfocal, speech normal GU: normal appearing external genitalia without lesions. Vagina is moist with minimal discharge. Cervix normal in appearance without purulent discharge. Mild cervical motion tenderness present. Right adnexal tenderness present, no left adnexal tenderness or uterine tenderness. Possible right adnexal fullness, but no discrete masses palpable. Left adnexa normal.  ASSESSMENT/PLAN:  # Health maintenance: - pap smear done today during pelvic  exam  See problem based charting for additional assessment/plan.  F/u separately for ear pain & supraclavicular puffiness, no obvious problems on exam today to suggest lymphadenopathy or acute otitis media.  FOLLOW UP: F/u in 2 weeks after ultrasound and labwork for RLQ pain. F/u separately for other concerns identified above.  [place]. Ardelia Mems, [place]

## 2014-04-30 NOTE — Assessment & Plan Note (Signed)
Possible adnexal fullness on exam. Minimal cervical motion tenderness present. Strongly doubt PID given no sexual activity in over 13 years. Will obtain pelvic ultrasound to further evaluate. Also check CBC to evaluate for leukocytosis as indicator of infection. F/u in 2 weeks to review results and discuss further plans. Precepted with Dr. Erin Hearing who agrees with this plan.

## 2014-04-30 NOTE — Patient Instructions (Signed)
It was great to see you again today!  We are checking an ultrasound of your pelvis. I am also checking bloodwork. I will call you if your test results are not normal.  Otherwise, I will send you a letter.  If you do not hear from me with in 2 weeks please call our office.      Follow up in 2 weeks with me for your pain. Follow up separately for the ear concern.  Be well, [name]

## 2014-05-01 NOTE — Progress Notes (Signed)
Agree with Ms. Guenther's assessment and plan. Carl E. Gessner, MD, FACG   

## 2014-05-02 LAB — CYTOLOGY - PAP

## 2014-05-07 ENCOUNTER — Other Ambulatory Visit: Payer: Self-pay | Admitting: Family Medicine

## 2014-05-07 ENCOUNTER — Ambulatory Visit (HOSPITAL_COMMUNITY)
Admission: RE | Admit: 2014-05-07 | Discharge: 2014-05-07 | Disposition: A | Discharge status: Home / Self Care | Payer: BC Managed Care – PPO | Source: Ambulatory Visit | Attending: Family Medicine | Admitting: Family Medicine

## 2014-05-07 DIAGNOSIS — Z1231 Encounter for screening mammogram for malignant neoplasm of breast: Secondary | ICD-10-CM

## 2014-05-07 DIAGNOSIS — N949 Unspecified condition associated with female genital organs and menstrual cycle: Secondary | ICD-10-CM | POA: Insufficient documentation

## 2014-05-07 DIAGNOSIS — R1031 Right lower quadrant pain: Secondary | ICD-10-CM | POA: Insufficient documentation

## 2014-05-07 DIAGNOSIS — N9489 Other specified conditions associated with female genital organs and menstrual cycle: Secondary | ICD-10-CM | POA: Insufficient documentation

## 2014-05-08 ENCOUNTER — Encounter: Payer: Self-pay | Admitting: Family Medicine

## 2014-05-14 ENCOUNTER — Encounter: Payer: Self-pay | Admitting: Family Medicine

## 2014-05-14 ENCOUNTER — Telehealth: Payer: Self-pay | Admitting: *Deleted

## 2014-05-14 ENCOUNTER — Ambulatory Visit (INDEPENDENT_AMBULATORY_CARE_PROVIDER_SITE_OTHER): Payer: BC Managed Care – PPO | Admitting: Family Medicine

## 2014-05-14 VITALS — BP 117/77 | HR 61 | Temp 98.0°F | Ht 61.0 in | Wt 133.0 lb

## 2014-05-14 DIAGNOSIS — R220 Localized swelling, mass and lump, head: Secondary | ICD-10-CM

## 2014-05-14 DIAGNOSIS — J45909 Unspecified asthma, uncomplicated: Secondary | ICD-10-CM

## 2014-05-14 DIAGNOSIS — R1031 Right lower quadrant pain: Secondary | ICD-10-CM

## 2014-05-14 DIAGNOSIS — J4540 Moderate persistent asthma, uncomplicated: Secondary | ICD-10-CM

## 2014-05-14 DIAGNOSIS — R221 Localized swelling, mass and lump, neck: Secondary | ICD-10-CM | POA: Insufficient documentation

## 2014-05-14 NOTE — Patient Instructions (Signed)
It was great to see you again today!  For abdominal pain: Let's continue to monitor this, hopefully will continue to improve. Your ultrasound and bloodwork looked good  For neck swelling: Continue to monitor this as well Get your mammogram Return if it is worsening  For breathing/cough: Take flovent twice a day, every day Take claritin every day  Follow up with me in 6 weeks to see how breathing is doing.  Be well, [name]

## 2014-05-14 NOTE — Assessment & Plan Note (Signed)
Poorly controlled with frequent cough. Encouraged pt to take flovent BID every day, and claritin daily for allergies, which will help. Pt agreeable to this plan. Will f/u in 6 weeks to monitor for improvement.

## 2014-05-14 NOTE — Telephone Encounter (Signed)
Message copied by Johny Shears on Tue May 14, 2014 11:53 AM ------      Message from: Leeanne Rio      Created: Tue May 07, 2014 10:15 PM       Please call pt and let her know u/s was normal. Thanks! Leeanne Rio, MD ------

## 2014-05-14 NOTE — Assessment & Plan Note (Signed)
Pelvic u/s was unremarkable, as was CBC. Now improving but occasionally still has pain with exertional squats/lifting/bending. No hernias found on exam today. Since it is overall improved, will continue to monitor.

## 2014-05-14 NOTE — Telephone Encounter (Signed)
Spoke with patient and informed her of below 

## 2014-05-14 NOTE — Assessment & Plan Note (Signed)
Etiology not clear. Does not seem to be malignant as no palpable lymph nodes present in neck, supraclavicular area, or axillae. Had recent normal CBC. UTD on cancer screenings except mammogram, which pt has scheduled already. Will continue to monitor. Instructed pt to return if it becomes more swollen or changes in any way. Precepted with Dr. Erin Hearing who also examined patient and agrees with this plan.

## 2014-05-14 NOTE — Progress Notes (Signed)
Patient ID: Diana Roach, female   DOB: 1951/03/23, 63 y.o.   MRN: [id number]  HPI:  Abdominal pain: here to f/u on RLQ pain. Had pelvic u/s which was unremarkable. Pt states the pain has overall improved. It used to be constant, and now occurs only with bending or picking stuff up. Has not noticed any bulges in her inguinal area. No vomiting or fevers. Had small amount of diarrhea yesterday, which was mild and self resolved  Swelling in neck: has intermittently noticed swelling in her left neck for the last 3 or so months. It comes and goes. Occasionally gets puffy. Has not felt any firm nodules. L ear has hurt some. No weight loss. Occasional night sweats, not nightly.   Asthma: has frequent cough, occasional feeling of being strangled, this happens about 3-4 times per month. Is supposed to be taking flovent BID but has been taking sparingly. Has not used albuterol in several months. Has allergic rhinitis and takes claritin as needed, not daily.   ROS: See HPI  [place]: Brother with lung cancer, sister with cervical dysplasia  PHYSICAL EXAM: BP 117/77  Pulse 61  Temp(Src) 98 F (36.7 C) (Oral)  Ht 5\' 1"  (1.549 m)  Wt 133 lb (60.328 kg)  BMI 25.14 kg/m2 Gen: NAD, pleasant, cooperative HEENT: NCAT, L TM slightly retracted over ossicles but not red/bulging. R TM normal in appearance. MMM without lesions. No appreciable cervical lymphadenopathy or thyromegaly. Mild swelling present in supraclavicular area on L side compared to R. nontender to palpation, and no palpable nodes or nodules.  Axillae: no lymphadenopathy bilaterally Heart: RRR, no murmurs Lungs: CTAB, NWOB Abdomen: soft, very mildly TTP in RLQ. No hernias palpable in either inguinal area. No guarding or peritoneal signs. Neuro: grossly nonfocal, speech normal  ASSESSMENT/PLAN:  # Health maintenance:  -due for mammogram, has this scheduled in August  See problem based charting for additional assessment/plan.  FOLLOW  UP: F/u in 6 weeks for asthma.  [place]. Ardelia Mems, [place]

## 2014-05-16 ENCOUNTER — Telehealth: Payer: Self-pay | Admitting: Family Medicine

## 2014-05-16 NOTE — Telephone Encounter (Signed)
Returned call to patient about her inhaler. She previously took flovent 110 mcg/inh one puff BID, but it appears that during her office visit with Dr. Bridgett Larsson in July of 2014, the order was changed to the 220/inh one puff BID. This is the dose I reordered at her recent visit, since it was the dose we had in the computer. Pt states she never took the 220, has always taken the 110 dosing. She paid over 100 dollars for this inhaler and is unable to return it to the pharmacy. I advised that it should be fine for her to take the 266mcg/inh one puff BID, but she is weary of taking the higher dose because it made her heart feel fluttery after taking it yesterday.  I will speak with Dr. Valentina Lucks and see if he has any ideas about changes we could make given this issue. Apologized to patient for the confusion with her dosing. I will get back in touch with her after speaking with Dr. Valentina Lucks.  Leeanne Rio, MD

## 2014-05-16 NOTE — Telephone Encounter (Signed)
Pt called because when she went to pick up her inhaler the dosage has been double and she doesn't know why. She said that she didn't discuss this change with [name] and wanted to talk to her on why the change.jw

## 2014-05-20 NOTE — Telephone Encounter (Signed)
Pt leaving for the beach on Saturday and wanted to have the inhaler problem resolved before then.  Please call her at earliest convenience to disuss issue.

## 2014-05-22 ENCOUNTER — Encounter: Payer: Self-pay | Admitting: Family Medicine

## 2014-05-22 ENCOUNTER — Ambulatory Visit (INDEPENDENT_AMBULATORY_CARE_PROVIDER_SITE_OTHER): Payer: BC Managed Care – PPO | Admitting: Family Medicine

## 2014-05-22 VITALS — BP 128/73 | HR 67 | Ht 61.0 in | Wt 133.9 lb

## 2014-05-22 DIAGNOSIS — J45909 Unspecified asthma, uncomplicated: Secondary | ICD-10-CM | POA: Diagnosis not present

## 2014-05-22 DIAGNOSIS — J4540 Moderate persistent asthma, uncomplicated: Secondary | ICD-10-CM

## 2014-05-22 MED ORDER — ALBUTEROL SULFATE HFA 108 (90 BASE) MCG/ACT IN AERS: 2.0000 | INHALATION_SPRAY | RESPIRATORY_TRACT | Status: DC | PRN

## 2014-05-22 MED ORDER — FLUTICASONE PROPIONATE HFA 110 MCG/ACT IN AERO: 1.0000 | INHALATION_SPRAY | Freq: Two times a day (BID) | RESPIRATORY_TRACT | Status: DC

## 2014-05-22 NOTE — Patient Instructions (Addendum)
To use up your 220 Flovent, use it twice a day for at least one week and make sure your asthma is back to normal.  Then you can try to cut back to once a day until the 220 inhaler is empty. Of course, we will go back to the 110 when that is gone. I hope the smaller size albuterol inhaler is less expensive.  Let me know if we need to change back to the 18 gram size. I sent in a prescription for the shingles vaccine.  Let us know if you get it so we can update your record.

## 2014-05-22 NOTE — Telephone Encounter (Signed)
Note that pt was seen today by Dr. Andria Frames in the office, who addressed concerns about her inhaler.  Leeanne Rio, MD

## 2014-05-23 MED ORDER — ZOSTER VACCINE LIVE 19400 UNT/0.65ML ~~LOC~~ SOLR: 0.6500 mL | Freq: Once | SUBCUTANEOUS | Status: DC

## 2014-05-23 NOTE — Assessment & Plan Note (Addendum)
Instructed that 220 is safe and how to use that MDI.  Also will refill her 110 and make sure med list is correct. As requested, given refill on albuterol Given mild symptoms and normal exam, will not treat as an acute exacerbation.

## 2014-05-23 NOTE — Progress Notes (Signed)
   Subjective:    Patient ID: Diana Roach, female    DOB: October 01, 1951, 63 y.o.   MRN: [id number]  HPI Ms Cline is having a mild flair of her asthma.  She has been off her flovent for a week due to concern about dosing.  She states she is supposed to be on 110 bid and was prescribed 220.  She does not have the money to get another inhalor right now.  Symptoms are mild wheeze and cough at night.  Using her albuterol at a higher than normal rate.  No fever or SOB right now.   Review of Systems     Objective:   Physical Exam Lungs clear.  No wheeze. No prolonged exp phase.       Assessment & Plan:

## 2014-06-04 ENCOUNTER — Ambulatory Visit (HOSPITAL_COMMUNITY)
Admission: RE | Admit: 2014-06-04 | Discharge: 2014-06-04 | Disposition: A | Discharge status: Home / Self Care | Payer: BC Managed Care – PPO | Source: Ambulatory Visit | Attending: Family Medicine | Admitting: Family Medicine

## 2014-06-04 DIAGNOSIS — Z1231 Encounter for screening mammogram for malignant neoplasm of breast: Secondary | ICD-10-CM | POA: Insufficient documentation

## 2014-07-03 ENCOUNTER — Ambulatory Visit: Payer: BC Managed Care – PPO | Admitting: Family Medicine

## 2014-08-21 ENCOUNTER — Ambulatory Visit: Payer: BC Managed Care – PPO | Admitting: Family Medicine

## 2014-09-04 ENCOUNTER — Encounter: Payer: Self-pay | Admitting: Family Medicine

## 2014-09-04 ENCOUNTER — Ambulatory Visit (INDEPENDENT_AMBULATORY_CARE_PROVIDER_SITE_OTHER): Payer: BC Managed Care – PPO | Admitting: *Deleted

## 2014-09-04 ENCOUNTER — Ambulatory Visit (INDEPENDENT_AMBULATORY_CARE_PROVIDER_SITE_OTHER): Payer: BC Managed Care – PPO | Admitting: Family Medicine

## 2014-09-04 VITALS — BP 121/80 | HR 86 | Temp 98.3°F | Ht 61.0 in | Wt 133.0 lb

## 2014-09-04 DIAGNOSIS — M5412 Radiculopathy, cervical region: Secondary | ICD-10-CM | POA: Diagnosis not present

## 2014-09-04 DIAGNOSIS — R200 Anesthesia of skin: Secondary | ICD-10-CM | POA: Insufficient documentation

## 2014-09-04 DIAGNOSIS — R208 Other disturbances of skin sensation: Secondary | ICD-10-CM | POA: Diagnosis not present

## 2014-09-04 DIAGNOSIS — Z23 Encounter for immunization: Secondary | ICD-10-CM | POA: Diagnosis not present

## 2014-09-04 HISTORY — DX: Radiculopathy, cervical region: M54.12

## 2014-09-04 HISTORY — DX: Anesthesia of skin: R20.0

## 2014-09-04 MED ORDER — MELOXICAM 7.5 MG PO TABS: 7.5000 mg | ORAL_TABLET | Freq: Every day | ORAL | Status: DC

## 2014-09-04 NOTE — Assessment & Plan Note (Signed)
Likely related to radiculopathy of cervical spine, C7 nerve distribution, bilateral with left worse than right. Strength intact bilaterally and otherwise with intact sensation / vascular function. Rx for Mobic (pt does not tolerate steroids well), referred for physical therapy, and given handout with instructions for home exercises. F/u with PCP [name] in about 1 month or sooner as needed; if no improvement, would consider imaging of c-spine and / or referral to orthopedics.

## 2014-09-04 NOTE — Patient Instructions (Signed)
Thank you for coming in, today!  I think your finger numbness is coming from arthritis or inflammation in your neck. Nerves can get irritated in your neck and cause symptoms in your hands.  I want you to take Mobic (meloxicam) 7.5 mg once a day, every day for two weeks. This is an anti-inflammatory medicine like ibuprofen, but it's once per day and not hard on your stomach. It should not make you sleepy or drowsy and is not habit-forming. This will help cut down inflammation and might help your finger symptoms.  I will also refer you to physical therapy. They can help with neck exercises that might help your hand symptoms, as well. I will give you some exercises you can use at home.  If all of the above doesn't help, the next step would be getting xrays or other imaging of your neck. It might also be helpful at that point to refer you to the orthopedic surgeons to see what else they would recommend.  Come back to see [name] in about a month. If you're no better, she can help decide what to do next. If you get worse or feel like you can't wait a month, come back sooner as you need.  Please feel free to call with any questions or concerns at any time, at [phone]. --Dr. Venetia Maxon

## 2014-09-04 NOTE — Progress Notes (Signed)
   Subjective:    Patient ID: Diana Roach, female    DOB: November 29, 1950, 63 y.o.   MRN: [id number]  HPI: Pt presents to [facility] for progressive numbness in her fingers. She has had numbness in her left ring and middle fingers for about 6 months, which has been slowly worse, and now she has had similar symptoms in her right hand for about 2 weeks. She works in Manufacturing engineer, and works with her hands often. She occasionally has shooting pain in the fingers, as well. She occasionally has some pain or weakness / heaviness in her left forearm. She has had rotator cuff pain in both shoulders, off and on for 5 or more years. She does report occasional popping with stretching her neck but denies frank pain in her neck.   Of note, pt saw sports medicine about a year ago for rotator cuff pain and similar symptoms as the above. She had low-back surgery in the 1980's for a rupture disk. She has taken prednisone for asthma in the past but high doses made her very irritable and even low doses increased her appetite to uncomfortable levels.  Review of Systems: As above.     Objective:   Physical Exam BP 121/80 mmHg  Pulse 86  Temp(Src) 98.3 F (36.8 C) (Oral)  Ht 5\' 1"  (1.549 m)  Wt 133 lb (60.328 kg)  BMI 25.14 kg/m2 Gen: well-appearing adult female in NAD HEENT: [place]/AT, EOMI, PERRLA, MMM MSK: neck / shoulders normal to inspection  Left and right shoulders nontender to palpation over bony prominences  Mild tenderness over AC joints bilaterally  Mild tenderness to palpation over spinous processes of C-spine, ~C6-C8  ROM to neck full though some muscle tightness with extremes of rotation, lateral bending, and extension  Mild painful arc of motion through arm flexion and abduction, though ROM is full  Some increased muscle tightness / discomfort  Positive Neer's impingement on left, positive Hawkins test on left  Equivocal O'Brien's test on left  NONE of the above maneuvers for exam definitely  exacerbate numbness / tingling of fingers  Neurovascular: alert, oriented, strength intact throughout bilateral UE in all planes  Strength 5/5 throughout bilateral hands in grip, PAD / DAB functions, flexion at MCP joints, and opposition  Distal fingertips warm, normal cap refill     Assessment & Plan:  See problem list notes.

## 2014-09-04 NOTE — Assessment & Plan Note (Signed)
Symptoms of finger numbness / tingling and point tenderness in midline raises concern for disc-related pathology. Exam consistent with history and also suggestive of possible rotator-cuff-like issues. See finger numbness problem list note; Rx for Mobic, referred for PT, and given home exercises. F/u in 1 month or sooner if needed, to consider imaging or referral to ortho at that time.

## 2014-09-23 ENCOUNTER — Ambulatory Visit: Payer: BC Managed Care – PPO | Attending: Family Medicine

## 2014-09-30 ENCOUNTER — Telehealth: Payer: Self-pay | Admitting: Internal Medicine

## 2014-09-30 NOTE — Telephone Encounter (Signed)
Patient advised that she will need to be evaluated prior to any pain meds being called in.  She is advised to start on a liquid diet and use tylenol for pain.  She is encouraged to keep the appt for tomorrow

## 2014-10-01 ENCOUNTER — Other Ambulatory Visit: Payer: Self-pay

## 2014-10-01 ENCOUNTER — Encounter: Payer: Self-pay | Admitting: Nurse Practitioner

## 2014-10-01 ENCOUNTER — Other Ambulatory Visit (INDEPENDENT_AMBULATORY_CARE_PROVIDER_SITE_OTHER): Payer: BC Managed Care – PPO

## 2014-10-01 ENCOUNTER — Ambulatory Visit (INDEPENDENT_AMBULATORY_CARE_PROVIDER_SITE_OTHER): Payer: BC Managed Care – PPO | Admitting: Nurse Practitioner

## 2014-10-01 ENCOUNTER — Ambulatory Visit (INDEPENDENT_AMBULATORY_CARE_PROVIDER_SITE_OTHER)
Admission: RE | Admit: 2014-10-01 | Discharge: 2014-10-01 | Disposition: A | Discharge status: Home / Self Care | Payer: BC Managed Care – PPO | Source: Ambulatory Visit | Attending: Nurse Practitioner | Admitting: Nurse Practitioner

## 2014-10-01 VITALS — BP 138/80 | HR 76 | Temp 98.0°F | Ht 61.0 in | Wt 132.2 lb

## 2014-10-01 DIAGNOSIS — R1030 Lower abdominal pain, unspecified: Secondary | ICD-10-CM

## 2014-10-01 DIAGNOSIS — R1032 Left lower quadrant pain: Secondary | ICD-10-CM

## 2014-10-01 DIAGNOSIS — K5730 Diverticulosis of large intestine without perforation or abscess without bleeding: Secondary | ICD-10-CM

## 2014-10-01 DIAGNOSIS — R1031 Right lower quadrant pain: Secondary | ICD-10-CM

## 2014-10-01 DIAGNOSIS — G8929 Other chronic pain: Secondary | ICD-10-CM

## 2014-10-01 DIAGNOSIS — K5790 Diverticulosis of intestine, part unspecified, without perforation or abscess without bleeding: Secondary | ICD-10-CM | POA: Insufficient documentation

## 2014-10-01 DIAGNOSIS — K5732 Diverticulitis of large intestine without perforation or abscess without bleeding: Secondary | ICD-10-CM

## 2014-10-01 LAB — CBC WITH DIFFERENTIAL/PLATELET
BASOS ABS: 0.1 10*3/uL (ref 0.0–0.1)
BASOS PCT: 0.7 % (ref 0.0–3.0)
Eosinophils Absolute: 0.4 10*3/uL (ref 0.0–0.7)
Eosinophils Relative: 3.6 % (ref 0.0–5.0)
HCT: 42 % (ref 36.0–46.0)
Hemoglobin: 13.7 g/dL (ref 12.0–15.0)
LYMPHS PCT: 21.4 % (ref 12.0–46.0)
Lymphs Abs: 2.1 10*3/uL (ref 0.7–4.0)
MCHC: 32.6 g/dL (ref 30.0–36.0)
MCV: 79.8 fl (ref 78.0–100.0)
MONOS PCT: 3.3 % (ref 3.0–12.0)
Monocytes Absolute: 0.3 10*3/uL (ref 0.1–1.0)
NEUTROS PCT: 71 % (ref 43.0–77.0)
Neutro Abs: 7 10*3/uL (ref 1.4–7.7)
Platelets: 388 10*3/uL (ref 150.0–400.0)
RBC: 5.26 Mil/uL — AB (ref 3.87–5.11)
RDW: 14.9 % (ref 11.5–15.5)
WBC: 9.8 10*3/uL (ref 4.0–10.5)

## 2014-10-01 MED ORDER — CIPROFLOXACIN HCL 500 MG PO TABS: 500.0000 mg | ORAL_TABLET | Freq: Two times a day (BID) | ORAL | Status: DC

## 2014-10-01 MED ORDER — METRONIDAZOLE 500 MG PO TABS: 500.0000 mg | ORAL_TABLET | Freq: Three times a day (TID) | ORAL | Status: DC

## 2014-10-01 NOTE — Progress Notes (Signed)
     History of Present Illness:  Patient is a 63 year old female known to Dr. Carlean Purl. I saw the patient in June for lower abdominal pain felt to be diverticulitis. At that time patient had already been treated with a course of antibiotics, I gave her a second round. Symptoms did improve but 2 weeks ago patient began to have recurrent lower abdominal pain. Pain is becoming progressively worse. She has also been constipated for 2 weeks. Patient has been taking MiraLAX but only having small volume stools. It does give her some relief to have a bowel movement. Patient reports chills but has been battling a respiratory infection.    Current Medications, Allergies, Past Medical History, Past Surgical History, Family History and Social History were reviewed in [employer] record.  Physical Exam: General: Pleasant, well developed , white female in no acute distress Head: Normocephalic and atraumatic Eyes:  sclerae anicteric, conjunctiva pink  Ears: Normal auditory acuity Lungs: RLL rhonchi. Lungs o/w clear throughout Heart: Regular rate and rhythm Abdomen: Soft, mildly distended. A few bowel sounds. Moderate LLQ tenderness. No masses, no hepatomegaly. Normal bowel sounds Rectal: No significant stool in vault Musculoskeletal: Symmetrical with no gross deformities  Extremities: No edema  Neurological: Alert oriented x 4, grossly nonfocal Psychological:  Alert and cooperative. Normal mood and affect  Assessment and Recommendations:    63 year old female with lower abdominal pain. Patient has a history of diverticulosis. She was treated in June for presumed diverticulitis, required 2 rounds of antibiotics. Now with two week history of recurrent lower abdominal pain / constipation. Pain may be secondary to constipation but diverticulitis strong possibility. Will obtain CBC and a KUB to assess for constipation. If large stool volume found will purge bowels to see if pain improves.  If no significant stool burden will start antibiotics and arrange for non-contrast CT scan of abd/pelvis (not ideal but she had significant reaction to IV contrast).

## 2014-10-01 NOTE — Patient Instructions (Signed)
Please go to the basement level to have your labs drawn and go to the radiology department also.  We will call you with the results of the labs and the x-ray.

## 2014-10-03 ENCOUNTER — Telehealth: Payer: Self-pay | Admitting: *Deleted

## 2014-10-03 ENCOUNTER — Telehealth: Payer: Self-pay | Admitting: Nurse Practitioner

## 2014-10-03 NOTE — Telephone Encounter (Signed)
Patient called to be sure it is ok for her to take oral contrast with CT due to allergy. Called [place] CT and spoke with Stacy. It is ok to take oral contrast. Patient aware.

## 2014-10-03 NOTE — Telephone Encounter (Signed)
A user error has taken place.

## 2014-10-03 NOTE — Progress Notes (Signed)
Agree with Ms. Guenther's assessment and plan. Carl E. Gessner, MD, FACG   

## 2014-10-04 ENCOUNTER — Ambulatory Visit (INDEPENDENT_AMBULATORY_CARE_PROVIDER_SITE_OTHER): Payer: BC Managed Care – PPO | Admitting: Family Medicine

## 2014-10-04 ENCOUNTER — Telehealth: Payer: Self-pay | Admitting: Nurse Practitioner

## 2014-10-04 ENCOUNTER — Encounter: Payer: Self-pay | Admitting: Family Medicine

## 2014-10-04 VITALS — BP 134/82 | HR 61 | Temp 98.0°F | Ht 61.0 in | Wt 132.0 lb

## 2014-10-04 DIAGNOSIS — R208 Other disturbances of skin sensation: Secondary | ICD-10-CM

## 2014-10-04 DIAGNOSIS — R1030 Lower abdominal pain, unspecified: Secondary | ICD-10-CM | POA: Diagnosis not present

## 2014-10-04 DIAGNOSIS — R200 Anesthesia of skin: Secondary | ICD-10-CM

## 2014-10-04 MED ORDER — HYDROCODONE-ACETAMINOPHEN 5-325 MG PO TABS: 1.0000 | ORAL_TABLET | Freq: Four times a day (QID) | ORAL | Status: DC | PRN

## 2014-10-04 NOTE — Progress Notes (Signed)
Patient ID: Diana Roach, female   DOB: 10-17-1951, 63 y.o.   MRN: [id number]  HPI:  Abd pain: Thinks has diverticulitis flare. No fevers but + chills. Tylenol not helping. Needs pain control. Has seen GI doctor for this who started her on cipro & flagyl. Has CT scan scheduled for Wednesday. Has taken hydrocodone in the past and did okay with this medication.   Finger discomfort: has numb feeling in middle 3 fingers on each hand. Saw Dr. Venetia Maxon here previously who thought this might be secondary to cervical radiculopathy. Has hx of carpal tunnel syndrome but has never had surgery for it.   ROS: See HPI.  [place]: hx HLD, asthma, diverticulosis  PHYSICAL EXAM: BP 134/82 mmHg  Pulse 61  Temp(Src) 98 F (36.7 C) (Oral)  Ht 5\' 1"  (1.549 m)  Wt 132 lb (59.875 kg)  BMI 24.95 kg/m2 Gen: NAD HEENT: NCAT. Full ROM of neck. Negative spurlings bilat. Heart: RRR Lungs: CTAB NWOB Abd: soft, mild TTP lower abd. Questionable possible L lower abd hernia, no signs of incarceration if it is present. Neuro: grossly nonfocal speech normal Ext: No appreciable lower extremity edema bilaterally. Hands with 2+ radial pulses bilat. Positive phalen's bilaterally, negative tinels. No wasting of thenar eminence. Full grip strength bilat.  ASSESSMENT/PLAN:  Bilateral finger numbness I think this is actually related to carpal tunnel syndrome as she has a positive phalens bilaterally and negative spurling. Will start with 4-6 weeks of nocturnal cockup splints. F/u in 4-6 weeks to eval for improvement.  Abdominal pain Has upcoming CT scan scheduled and is already on cipro and flagyl. I am okay with writing her some norco for pain relief for a short term course. She has a possible mild small hernia on her lower abdomen. Will await CT scan to evaluate this.   FOLLOW UP: F/u in 4-6 weeks with me for carpal tunnel syndrome Continue to f/u with GI for abd pain and possible diverticulitis  [name],  [place]

## 2014-10-04 NOTE — Patient Instructions (Addendum)
I think you have carpal tunnel syndrome on both sides. Get cock-up splints to wear at night on each wrist. Follow up in 4-6 weeks.  For stomach pain, I'm prescribing some hydrocodone for you for this flare of diverticulitis.  Continue to follow up with your GI doctor.  Happy Holidays!  [name]    Carpal Tunnel Syndrome The carpal tunnel is a narrow area located on the palm side of your wrist. The tunnel is formed by the wrist bones and ligaments. Nerves, blood vessels, and tendons pass through the carpal tunnel. Repeated wrist motion or certain diseases may cause swelling within the tunnel. This swelling pinches the main nerve in the wrist (median nerve) and causes the painful hand and arm condition called carpal tunnel syndrome. CAUSES   Repeated wrist motions.  Wrist injuries.  Certain diseases like arthritis, diabetes, alcoholism, hyperthyroidism, and kidney failure.  Obesity.  Pregnancy. SYMPTOMS   A "pins and needles" feeling in your fingers or hand, especially in your thumb, index and middle fingers.  Tingling or numbness in your fingers or hand.  An aching feeling in your entire arm, especially when your wrist and elbow are bent for long periods of time.  Wrist pain that goes up your arm to your shoulder.  Pain that goes down into your palm or fingers.  A weak feeling in your hands. DIAGNOSIS  Your health care provider will take your history and perform a physical exam. An electromyography test may be needed. This test measures electrical signals sent out by your nerves into the muscles. The electrical signals are usually slowed by carpal tunnel syndrome. You may also need X-rays. TREATMENT  Carpal tunnel syndrome may clear up by itself. Your health care provider may recommend a wrist splint or medicine such as a nonsteroidal anti-inflammatory medicine. Cortisone injections may help. Sometimes, surgery may be needed to free the pinched nerve.  HOME CARE INSTRUCTIONS    Take all medicine as directed by your health care provider. Only take over-the-counter or prescription medicines for pain, discomfort, or fever as directed by your health care provider.  If you were given a splint to keep your wrist from bending, wear it as directed. It is important to wear the splint at night. Wear the splint for as long as you have pain or numbness in your hand, arm, or wrist. This may take 1 to 2 months.  Rest your wrist from any activity that may be causing your pain. If your symptoms are work-related, you may need to talk to your employer about changing to a job that does not require using your wrist.  Put ice on your wrist after long periods of wrist activity.  Put ice in a plastic bag.  Place a towel between your skin and the bag.  Leave the ice on for 15-20 minutes, 03-04 times a day.  Keep all follow-up visits as directed by your health care provider. This includes any orthopedic referrals, physical therapy, and rehabilitation. Any delay in getting necessary care could result in a delay or failure of your condition to heal. SEEK IMMEDIATE MEDICAL CARE IF:   You have new, unexplained symptoms.  Your symptoms get worse and are not helped or controlled with medicines. MAKE SURE YOU:   Understand these instructions.  Will watch your condition.  Will get help right away if you are not doing well or get worse. Document Released: 10/08/2000 Document Revised: 02/25/2014 Document Reviewed: 08/27/2011 [facility] Patient Information 2015 [place], [place]. This information is not  intended to replace advice given to you by your health care provider. Make sure you discuss any questions you have with your health care provider.

## 2014-10-04 NOTE — Telephone Encounter (Signed)
Spoke with patient and she is seeing her PCP now for a previously scheduled OV. She will ask about a CT there. She will go to ED if pain continues or worsens.

## 2014-10-04 NOTE — Telephone Encounter (Signed)
Given this change in sxs I think she needs her CT sooner - only way to get that done now would be to be seen in ED, I think  Unless you can get it done sooner and we have them call on call MD with results  Unfortunately cannot call any narcotics in, either - could get an RX from ED if CT does not show any bad problems

## 2014-10-04 NOTE — Telephone Encounter (Signed)
Spoke with patient and she is taking her Cipro and Flagyl. She reports she was feeling better yesterday but had a terrible night. States she was up and down all night with RLQ pain that goes to her back. She did have a small bowel movement yesterday. No bowel movement today. Tylenol is not helping pain. She is asking for something for the pain. Please, advise.

## 2014-10-09 ENCOUNTER — Ambulatory Visit (INDEPENDENT_AMBULATORY_CARE_PROVIDER_SITE_OTHER)
Admission: RE | Admit: 2014-10-09 | Discharge: 2014-10-09 | Disposition: A | Discharge status: Home / Self Care | Payer: BC Managed Care – PPO | Source: Ambulatory Visit | Attending: Nurse Practitioner | Admitting: Nurse Practitioner

## 2014-10-09 DIAGNOSIS — K5732 Diverticulitis of large intestine without perforation or abscess without bleeding: Secondary | ICD-10-CM

## 2014-10-11 ENCOUNTER — Telehealth: Payer: Self-pay | Admitting: Nurse Practitioner

## 2014-10-11 NOTE — Assessment & Plan Note (Signed)
Has upcoming CT scan scheduled and is already on cipro and flagyl. I am okay with writing her some norco for pain relief for a short term course. She has a possible mild small hernia on her lower abdomen. Will await CT scan to evaluate this.

## 2014-10-11 NOTE — Assessment & Plan Note (Signed)
I think this is actually related to carpal tunnel syndrome as she has a positive phalens bilaterally and negative spurling. Will start with 4-6 weeks of nocturnal cockup splints. F/u in 4-6 weeks to eval for improvement.

## 2014-10-11 NOTE — Telephone Encounter (Signed)
Patient calling for CT results. Please, advise. 

## 2014-10-14 ENCOUNTER — Telehealth: Payer: Self-pay | Admitting: Family Medicine

## 2014-10-14 DIAGNOSIS — N135 Crossing vessel and stricture of ureter without hydronephrosis: Secondary | ICD-10-CM

## 2014-10-14 DIAGNOSIS — Z13220 Encounter for screening for lipoid disorders: Secondary | ICD-10-CM

## 2014-10-14 NOTE — Telephone Encounter (Signed)
Called pt to discuss results of CT scan which was ordered by GI. Showed possible chronic R UPJ obstruction. I doubt this is contributing to her LLQ pain. CT did not show evidence of diverticulitis at present.  Plan: -pt to come in for check of renal function since last Cr was checked in 06/2013 (will go ahead and check lipids & LFT's at that time too since she's due) -refer to urology for possible R UPJ obstruction -discussed with pt that she should come in for a same day appt if her LLQ pain is not improving, to evaluate for other possible etiologies.  Pt agreeable to this plan. Leeanne Rio, MD

## 2014-10-14 NOTE — Telephone Encounter (Signed)
Pt called and wanted to know what her test results were. [place] told her to talk to her PCP to see what the next steps should be. jw

## 2014-10-15 ENCOUNTER — Other Ambulatory Visit: Payer: BC Managed Care – PPO

## 2014-10-15 DIAGNOSIS — N135 Crossing vessel and stricture of ureter without hydronephrosis: Secondary | ICD-10-CM

## 2014-10-15 DIAGNOSIS — Z13220 Encounter for screening for lipoid disorders: Secondary | ICD-10-CM

## 2014-10-15 LAB — COMPREHENSIVE METABOLIC PANEL
ALBUMIN: 3.8 g/dL (ref 3.5–5.2)
ALT: 15 U/L (ref 0–35)
AST: 20 U/L (ref 0–37)
Alkaline Phosphatase: 60 U/L (ref 39–117)
BUN: 11 mg/dL (ref 6–23)
CALCIUM: 9.2 mg/dL (ref 8.4–10.5)
CHLORIDE: 105 meq/L (ref 96–112)
CO2: 25 meq/L (ref 19–32)
CREATININE: 0.88 mg/dL (ref 0.50–1.10)
Glucose, Bld: 101 mg/dL — ABNORMAL HIGH (ref 70–99)
POTASSIUM: 4.5 meq/L (ref 3.5–5.3)
Sodium: 138 mEq/L (ref 135–145)
Total Bilirubin: 0.4 mg/dL (ref 0.2–1.2)
Total Protein: 6.5 g/dL (ref 6.0–8.3)

## 2014-10-15 LAB — LIPID PANEL
CHOL/HDL RATIO: 4 ratio
CHOLESTEROL: 155 mg/dL (ref 0–200)
HDL: 39 mg/dL — ABNORMAL LOW (ref >39)
LDL Cholesterol: 90 mg/dL (ref 0–99)
Triglycerides: 131 mg/dL (ref <150)
VLDL: 26 mg/dL (ref 0–40)

## 2014-10-15 NOTE — Progress Notes (Signed)
CMP AND FLP DONE TODAY Ayeza Therriault 

## 2014-10-22 NOTE — Telephone Encounter (Signed)
See results notes. 

## 2014-10-23 ENCOUNTER — Telehealth: Payer: Self-pay | Admitting: Family Medicine

## 2014-10-23 MED ORDER — ATORVASTATIN CALCIUM 20 MG PO TABS: 20.0000 mg | ORAL_TABLET | Freq: Every day | ORAL | Status: DC

## 2014-10-23 NOTE — Telephone Encounter (Signed)
Patient informed, expressed understanding. 

## 2014-10-23 NOTE — Telephone Encounter (Signed)
[place] red team, please call pt and let her know:  1. Her kidney function is normal. 2. Her cholesterol and other risk factors for heart disease suggest she might benefit from being on cholesterol medication. I recommend she takes lipitor 20mg  daily. I will send this in to her pharmacy.  Thanks! Leeanne Rio, MD

## 2014-10-29 ENCOUNTER — Encounter: Payer: Self-pay | Admitting: Family Medicine

## 2014-10-29 NOTE — Patient Instructions (Signed)
Pt has corrected Drs name at [facility]. She has to go to [facility] after Feb 1. She says we have to call [place] to do the referral and [employer] will do the referral. Please advise

## 2014-10-29 NOTE — Progress Notes (Signed)
Called [employer], patients PCP is listed as a Dr. Verner Chol, called patient and let her know that she will have to call insurance company to have PCP changed before referral can be processed. Patient will call back once this has been fixed.

## 2014-10-29 NOTE — Progress Notes (Signed)
Patient now has [employer] and is needing a referral through them to her kidney doctor.  She had to reschedule her appointment with them because of needing a referral with her new insurance.  New card has been scanned in.  Dr Tresa Moore at [facility] [phone].  Please call patient to let her know when this has been done.

## 2014-11-04 ENCOUNTER — Ambulatory Visit (INDEPENDENT_AMBULATORY_CARE_PROVIDER_SITE_OTHER): Payer: 59 | Admitting: Family Medicine

## 2014-11-04 ENCOUNTER — Encounter: Payer: Self-pay | Admitting: Family Medicine

## 2014-11-04 VITALS — BP 138/81 | HR 94 | Temp 98.3°F | Ht 61.0 in | Wt 135.0 lb

## 2014-11-04 DIAGNOSIS — R200 Anesthesia of skin: Secondary | ICD-10-CM

## 2014-11-04 DIAGNOSIS — N135 Crossing vessel and stricture of ureter without hydronephrosis: Secondary | ICD-10-CM

## 2014-11-04 DIAGNOSIS — R208 Other disturbances of skin sensation: Secondary | ICD-10-CM

## 2014-11-04 NOTE — Patient Instructions (Signed)
I am referring you to hand surgery for your carpal tunnel syndrome. You will get a phone call to schedule this appointment.  Follow up with me in 2-3 months for your regular medical issues or sooner if you have any problems I'll also put in the referral to urology  Be well, [name]    Carpal Tunnel Syndrome The carpal tunnel is a narrow area located on the palm side of your wrist. The tunnel is formed by the wrist bones and ligaments. Nerves, blood vessels, and tendons pass through the carpal tunnel. Repeated wrist motion or certain diseases may cause swelling within the tunnel. This swelling pinches the main nerve in the wrist (median nerve) and causes the painful hand and arm condition called carpal tunnel syndrome. CAUSES   Repeated wrist motions.  Wrist injuries.  Certain diseases like arthritis, diabetes, alcoholism, hyperthyroidism, and kidney failure.  Obesity.  Pregnancy. SYMPTOMS   A "pins and needles" feeling in your fingers or hand, especially in your thumb, index and middle fingers.  Tingling or numbness in your fingers or hand.  An aching feeling in your entire arm, especially when your wrist and elbow are bent for long periods of time.  Wrist pain that goes up your arm to your shoulder.  Pain that goes down into your palm or fingers.  A weak feeling in your hands. DIAGNOSIS  Your health care provider will take your history and perform a physical exam. An electromyography test may be needed. This test measures electrical signals sent out by your nerves into the muscles. The electrical signals are usually slowed by carpal tunnel syndrome. You may also need X-rays. TREATMENT  Carpal tunnel syndrome may clear up by itself. Your health care provider may recommend a wrist splint or medicine such as a nonsteroidal anti-inflammatory medicine. Cortisone injections may help. Sometimes, surgery may be needed to free the pinched nerve.  HOME CARE INSTRUCTIONS   Take all  medicine as directed by your health care provider. Only take over-the-counter or prescription medicines for pain, discomfort, or fever as directed by your health care provider.  If you were given a splint to keep your wrist from bending, wear it as directed. It is important to wear the splint at night. Wear the splint for as long as you have pain or numbness in your hand, arm, or wrist. This may take 1 to 2 months.  Rest your wrist from any activity that may be causing your pain. If your symptoms are work-related, you may need to talk to your employer about changing to a job that does not require using your wrist.  Put ice on your wrist after long periods of wrist activity.  Put ice in a plastic bag.  Place a towel between your skin and the bag.  Leave the ice on for 15-20 minutes, 03-04 times a day.  Keep all follow-up visits as directed by your health care provider. This includes any orthopedic referrals, physical therapy, and rehabilitation. Any delay in getting necessary care could result in a delay or failure of your condition to heal. SEEK IMMEDIATE MEDICAL CARE IF:   You have new, unexplained symptoms.  Your symptoms get worse and are not helped or controlled with medicines. MAKE SURE YOU:   Understand these instructions.  Will watch your condition.  Will get help right away if you are not doing well or get worse. Document Released: 10/08/2000 Document Revised: 02/25/2014 Document Reviewed: 08/27/2011 [facility] Patient Information 2015 [place], [place]. This information is not  intended to replace advice given to you by your health care provider. Make sure you discuss any questions you have with your health care provider.

## 2014-11-10 NOTE — Assessment & Plan Note (Signed)
Persistent symptoms, didn't tolerate cockup splints. Patient willing to have surgical procedure of this are necessary. We'll refer to hand surgery for further evaluation.

## 2014-11-10 NOTE — Progress Notes (Signed)
  HPI:  Pt presents to f/u on carpal tunnel syndrome. She was not really able to tolerate the cockup splints. She has tried NSAIDs without relief. She does not want an injection. Continues to have constant numbness in her middle and fourth fingers bilaterally. She states she would be willing to have a surgery for this, as well as her that badly.  Also states she needs [place] to enter referral to urology so that she can maintain her present appointment with them.  ROS: See HPI.  [place]: History of hyperlipidemia, asthma, lichen planus, diverticulosis  PHYSICAL EXAM: BP 138/81 mmHg  Pulse 94  Temp(Src) 98.3 F (36.8 C) (Oral)  Ht 5\' 1"  (1.549 m)  Wt 135 lb (61.236 kg)  BMI 25.52 kg/m2 Gen: No acute distress, pleasant, cooperative HEENT: Normocephalic, atraumatic Ext: Bilateral wrist normal in appearance. 2+ radial pulses bilaterally. Grip strength 5 out of 5 bilaterally. No wasting of thenar eminence. Negative Tinel and Phalen bilaterally. Some decreased sensation over third and fourth fingers bilaterally.  ASSESSMENT/PLAN:  I will enter referral to urology, since she has an appointment with them. She just needs this for insurance purposes  Bilateral finger numbness Persistent symptoms, didn't tolerate cockup splints. Patient willing to have surgical procedure of this are necessary. We'll refer to hand surgery for further evaluation.    FOLLOW UP: F/u in 2-3 months for chronic medical issues Referring to hand surgery  [name], [place]

## 2014-11-25 ENCOUNTER — Telehealth: Payer: Self-pay | Admitting: Family Medicine

## 2014-11-25 NOTE — Telephone Encounter (Signed)
Needs [employer] referral Nerve conduction test was done Dr Edwyna Shell  It is the [place] code G56.01 g56.02

## 2014-11-25 NOTE — Telephone Encounter (Signed)
Authorization obtained #[id number]

## 2014-12-02 ENCOUNTER — Other Ambulatory Visit (HOSPITAL_COMMUNITY): Payer: Self-pay | Admitting: Urology

## 2014-12-02 DIAGNOSIS — N1330 Unspecified hydronephrosis: Secondary | ICD-10-CM

## 2014-12-27 ENCOUNTER — Ambulatory Visit (HOSPITAL_COMMUNITY)
Admission: RE | Admit: 2014-12-27 | Discharge: 2014-12-27 | Disposition: A | Discharge status: Home / Self Care | Payer: 59 | Source: Ambulatory Visit | Attending: Urology | Admitting: Urology

## 2014-12-27 DIAGNOSIS — N1330 Unspecified hydronephrosis: Secondary | ICD-10-CM | POA: Insufficient documentation

## 2014-12-27 MED ORDER — FUROSEMIDE 10 MG/ML IJ SOLN
32.0000 mg | Freq: Once | INTRAMUSCULAR | Status: AC
  Administered 2014-12-27: 32 mg via INTRAVENOUS
  Filled 2014-12-27: qty 4

## 2014-12-27 MED ORDER — TECHNETIUM TC 99M MERTIATIDE: 37.0000 | Freq: Once | INTRAVENOUS | Status: DC | PRN

## 2014-12-27 MED ORDER — TECHNETIUM TC 99M MERTIATIDE
14.4000 | Freq: Once | INTRAVENOUS | Status: AC | PRN
  Administered 2014-12-27: 14.4 via INTRAVENOUS

## 2015-01-10 ENCOUNTER — Encounter: Payer: Self-pay | Admitting: Family Medicine

## 2015-01-10 ENCOUNTER — Ambulatory Visit (INDEPENDENT_AMBULATORY_CARE_PROVIDER_SITE_OTHER): Payer: 59 | Admitting: Family Medicine

## 2015-01-10 VITALS — BP 110/80 | HR 69 | Temp 98.3°F | Ht 61.0 in | Wt 133.0 lb

## 2015-01-10 DIAGNOSIS — J4540 Moderate persistent asthma, uncomplicated: Secondary | ICD-10-CM

## 2015-01-10 DIAGNOSIS — J069 Acute upper respiratory infection, unspecified: Secondary | ICD-10-CM | POA: Diagnosis not present

## 2015-01-10 MED ORDER — ALBUTEROL SULFATE HFA 108 (90 BASE) MCG/ACT IN AERS
2.0000 | INHALATION_SPRAY | RESPIRATORY_TRACT | Status: DC | PRN
Start: 2015-01-10 — End: 2016-12-27

## 2015-01-10 MED ORDER — BENZONATATE 200 MG PO CAPS: 200.0000 mg | ORAL_CAPSULE | Freq: Two times a day (BID) | ORAL | Status: DC | PRN

## 2015-01-10 NOTE — Patient Instructions (Signed)
It was a pleasure seeing you today, Diana Roach!  I am so sorry that you have not been feeling well.  Based on your exam and what you have told me, I suspect that you have a viral upper respiratory infection (a cold).  I encourage you to schedule your albuterol inhaler every 4 hours for the next 2 days to help with cough and wheeze.  Also, I have sent in a medication called Tessalon for the cough.  You can take Tylenol as needed on bottle for headache/fever.  If your symptoms do not improve after 2 weeks or get worse, please come in for an office visit.  Information regarding what we discussed is included in this packet.   Please feel free to call our office at [phone] if any questions or concerns arise.  Warm Regards, Chantel Teti M. Danell Verno, DO   Upper Respiratory Infection, Adult An upper respiratory infection (URI) is also known as the common cold. It is often caused by a type of germ (virus). Colds are easily spread (contagious). You can pass it to others by kissing, coughing, sneezing, or drinking out of the same glass. Usually, you get better in 1 or 2 weeks.  HOME CARE   Only take medicine as told by your doctor.  Use a warm mist humidifier or breathe in steam from a hot shower.  Drink enough water and fluids to keep your pee (urine) clear or pale yellow.  Get plenty of rest.  Return to work when your temperature is back to normal or as told by your doctor. You may use a face mask and wash your hands to stop your cold from spreading. GET HELP RIGHT AWAY IF:   After the first few days, you feel you are getting worse.  You have questions about your medicine.  You have chills, shortness of breath, or brown or red spit (mucus).  You have yellow or brown snot (nasal discharge) or pain in the face, especially when you bend forward.  You have a fever, puffy (swollen) neck, pain when you swallow, or white spots in the back of your throat.  You have a bad headache, ear pain, sinus  pain, or chest pain.  You have a high-pitched whistling sound when you breathe in and out (wheezing).  You have a lasting cough or cough up blood.  You have sore muscles or a stiff neck. MAKE SURE YOU:   Understand these instructions.  Will watch your condition.  Will get help right away if you are not doing well or get worse. Document Released: 03/29/2008 Document Revised: 01/03/2012 Document Reviewed: 01/16/2014 [facility] Patient Information 2015 [place], [place]. This information is not intended to replace advice given to you by your health care provider. Make sure you discuss any questions you have with your health care provider.

## 2015-01-11 DIAGNOSIS — R05 Cough: Secondary | ICD-10-CM | POA: Insufficient documentation

## 2015-01-11 DIAGNOSIS — R059 Cough, unspecified: Secondary | ICD-10-CM | POA: Insufficient documentation

## 2015-01-11 NOTE — Assessment & Plan Note (Addendum)
Patient with 4 day h/o cough, congestion, chills.  Had flu shot this year.  No hemoptysis.  Illness was NOT sudden onset.  Therefore, do not believe this to be flu.  Also, no fever today in office. -Tessalon 200mg  BID for cough -Albuterol scheduled q4 x2 days -Resume Flovent -Tylenol PRN headache/myalgia/fever -Drink plenty of fluids, get rest -Discussed possibility of prolonged cough in setting of viral URI -Return if no improvement or worsening of symptoms in 2 weeks. Patient voiced good understaning of plan

## 2015-01-11 NOTE — Progress Notes (Signed)
Patient ID: Diana Roach, female   DOB: August 12, 1951, 64 y.o.   MRN: [id number]    Subjective: CC: cold symptoms HPI: Patient is a 64 y.o. female presenting to clinic today for same day appt. Concerns today include:  1. Cold symptoms Patient reports a 4 day h/o cold symptoms that started as a tickle in her throat and a mild cough.  She reports chills and subjective fever.  She also endorses a productive cough.  Endorses alternating stuffy and runny nose.  Had nose bleed this morning that has resolved.  She endorsed myalgia early on but states that this has resolved.  Also endorsing intermittent headache and sinus pressure.  Denies hemoptysis, SOB outside of normal, chest pain, current myalgia, nausea, vomiting, diarrhea, change in vision, sick contacts.  She states she has had her flu shot this year.  In addition, she reports that she has not been using Albuterol during illness and is not compliant with Flovent.  She has used no medications/remedies for symptoms.   Social History Reviewed: non smoker. FamHx and MedHx updated.  Please see EMR.  ROS: All other systems reviewed and are negative.  Objective: Office vital signs reviewed. BP 110/80 mmHg  Pulse 69  Temp(Src) 98.3 F (36.8 C) (Oral)  Ht 5\' 1"  (1.549 m)  Wt 133 lb (60.328 kg)  BMI 25.14 kg/m2  SpO2 97%  Physical Examination:  General: Awake, alert, tired appearing female, NAD, face mask in place HEENT: Normal, no TTP to maxillary or frontal sinus    Neck: No masses palpated. No LAD    Ears: TMs intact, normal light reflex, no erythema, no bulging    Eyes: PERRLA, EOMI    Nose: nasal turbinates erythematous, edematous    Throat: MMM, mild o/p erythema but no exudates Cardio: RRR, S1S2 heard, no murmurs appreciated Pulm: mild expiratory wheezes, no increased WOB, good air movement MSK: Normal gait and station Skin: dry, intact, no rashes or lesions  Assessment: 65 y.o. female with URI and h/o asthma  Plan: See  Problem List and After Visit Summary   [name], DO PGY-1, [place]

## 2015-01-11 NOTE — Assessment & Plan Note (Signed)
Wheezes appreciated on exam -Encouraged patient to take Albuterol scheduled q4 hours for next 2 days, in setting of URI.  Then PRN thereafter -Patient encouraged to resume daily Flovent.   -Return precautions viven

## 2015-01-16 ENCOUNTER — Encounter: Payer: Self-pay | Admitting: Family Medicine

## 2015-01-16 ENCOUNTER — Ambulatory Visit (INDEPENDENT_AMBULATORY_CARE_PROVIDER_SITE_OTHER): Payer: 59 | Admitting: Family Medicine

## 2015-01-16 VITALS — BP 160/75 | HR 76 | Temp 97.7°F | Ht 61.0 in | Wt 134.0 lb

## 2015-01-16 DIAGNOSIS — R0982 Postnasal drip: Secondary | ICD-10-CM

## 2015-01-16 DIAGNOSIS — J069 Acute upper respiratory infection, unspecified: Secondary | ICD-10-CM | POA: Diagnosis not present

## 2015-01-16 DIAGNOSIS — J209 Acute bronchitis, unspecified: Secondary | ICD-10-CM

## 2015-01-16 DIAGNOSIS — J329 Chronic sinusitis, unspecified: Secondary | ICD-10-CM

## 2015-01-16 MED ORDER — TRIAMCINOLONE ACETONIDE 55 MCG/ACT NA AERO: 2.0000 | INHALATION_SPRAY | Freq: Every day | NASAL | Status: DC

## 2015-01-16 NOTE — Patient Instructions (Addendum)
Thank you for coming to see me today. It was a pleasure. Today we talked about:   Coughing: this is most likely caused by a virus. I am really sorry you are having to deal with this as I am sure it is very difficult. As we discussed, please use the Nasacort daily as this may help with your symptoms somewhat. Also continue using Tylenol and/or Ibuprofen per label directions. Continue Zyrtec daily. Also, please continue your Flovent daily and your albuterol as needed. I suspect this may last a week more or so, but at this time, I do not suspect a bacterial infection. Please do not hesitate to return if symptoms worsen or fail to improve in the next 1-2 weeks  If you have any questions or concerns, please do not hesitate to call the office at [phone].  Sincerely,  Cordelia Poche, MD

## 2015-01-16 NOTE — Progress Notes (Signed)
    Subjective   Diana Roach is a 64 y.o. female that presents for a same day visit  1. Coughing: Symptoms started 9 days ago. Symptoms have improved and worsened in that timeframe. She has associated frontal headache, sneezing, runny nose. She is unsure about fevers but she has chills. She has taken Tylenol and vitamin C which have not helped. She took Zyrtec once yesterday. Tessalon has not helped much. Albuterol helps with her wheezing. Her son-in-law is sick as well.  History  Substance Use Topics  . Smoking status: Former Research scientist (life sciences)  . Smokeless tobacco: Never Used  . Alcohol Use: No    ROS Per HPI  Objective   BP 160/75 mmHg  Pulse 76  Temp(Src) 97.7 F (36.5 C) (Oral)  Ht 5\' 1"  (1.549 m)  Wt 134 lb (60.782 kg)  BMI 25.33 kg/m2  SpO2 96%  General: Fair appearing, no distress HEENT: TMs clear with no erythema; mild maxillary sinus tenderness bilaterally with no frontal sinus tenderness; nasal mucosa normal, throat clear and without erythema. Slight cobblestoning present. No cervical lymphadenopathy Respiratory/Chest: Clear to auscultation bilaterally. No wheezing  Assessment and Plan   Viral URI Acute bronchitis Post-nasal drainage  Start Nasacort 2 sprays into each nostril once daily  Continue Zyrtec daily  Recommended continued use of Flovent daily especially since using albuterol a lot  Continue albuterol PRN  Return precautions discussed

## 2015-01-20 ENCOUNTER — Telehealth: Payer: Self-pay | Admitting: Family Medicine

## 2015-01-20 NOTE — Telephone Encounter (Signed)
I will defer to Dr. Lonny Prude since he saw patient last.

## 2015-01-20 NOTE — Telephone Encounter (Signed)
Patient seen on 3/18 and 3/24 for URI with cough, states she continues to get worse and would like abx called in. Will forward to PCP and last MD seen.

## 2015-01-20 NOTE — Telephone Encounter (Signed)
Keeps getting worse Wants an antibotic Please advise

## 2015-01-21 ENCOUNTER — Ambulatory Visit (HOSPITAL_COMMUNITY)
Admission: RE | Admit: 2015-01-21 | Discharge: 2015-01-21 | Disposition: A | Discharge status: Home / Self Care | Payer: 59 | Source: Ambulatory Visit | Attending: Family Medicine | Admitting: Family Medicine

## 2015-01-21 ENCOUNTER — Encounter: Payer: Self-pay | Admitting: Family Medicine

## 2015-01-21 ENCOUNTER — Ambulatory Visit (INDEPENDENT_AMBULATORY_CARE_PROVIDER_SITE_OTHER): Payer: 59 | Admitting: Family Medicine

## 2015-01-21 VITALS — BP 138/78 | HR 119 | Temp 98.5°F | Wt 132.0 lb

## 2015-01-21 DIAGNOSIS — J984 Other disorders of lung: Secondary | ICD-10-CM | POA: Insufficient documentation

## 2015-01-21 DIAGNOSIS — R05 Cough: Secondary | ICD-10-CM | POA: Diagnosis present

## 2015-01-21 DIAGNOSIS — R059 Cough, unspecified: Secondary | ICD-10-CM

## 2015-01-21 MED ORDER — PREDNISONE 10 MG PO TABS: 10.0000 mg | ORAL_TABLET | Freq: Every day | ORAL | Status: DC

## 2015-01-21 MED ORDER — AZITHROMYCIN 250 MG PO TABS: ORAL_TABLET | ORAL | Status: DC

## 2015-01-21 NOTE — Patient Instructions (Signed)
Good to see you today. Take prednisone daily for 5 days. Use albuterol every 4 hours for the next 2 days, then every 4 hours as needed. Stay away from any irritants like tobacco smoke and any odors at your job. Take azithromycin for 5 days. Follow-up in about one week to make sure you're doing better, or sooner as needed. If you're getting totally better, you do not need to follow-up. Even after you're feeling better, you may have a postviral cough for up to 6 weeks. Get the chest x-ray so that we can see if there are any other infiltrates in your lungs. Stay hydrated.
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
## 2015-01-22 ENCOUNTER — Telehealth: Payer: Self-pay | Admitting: Family Medicine

## 2015-01-22 NOTE — Telephone Encounter (Signed)
Please let patient know that chest x-ray has mild scarring in the right base possibly from a prior infection but no sign of bacterial infection including in the left base which is where I heard mild crackles. She can continue the azithromycin we discussed and come back if she is getting better. I am happy to answer any questions she may have.  Hilton Sinclair, MD

## 2015-01-23 NOTE — Telephone Encounter (Signed)
Pt is aware and voiced understanding on message from Dr. Genia Hotter

## 2015-01-28 ENCOUNTER — Ambulatory Visit (INDEPENDENT_AMBULATORY_CARE_PROVIDER_SITE_OTHER): Payer: 59 | Admitting: Family Medicine

## 2015-01-28 ENCOUNTER — Encounter: Payer: Self-pay | Admitting: Family Medicine

## 2015-01-28 VITALS — BP 108/71 | HR 56 | Temp 98.3°F | Ht 61.0 in | Wt 136.4 lb

## 2015-01-28 DIAGNOSIS — R05 Cough: Secondary | ICD-10-CM

## 2015-01-28 DIAGNOSIS — R059 Cough, unspecified: Secondary | ICD-10-CM

## 2015-01-28 NOTE — Progress Notes (Signed)
   Subjective:    Patient ID: Diana Roach, female    DOB: 04-Aug-1951, 64 y.o.   MRN: [id number]  HPI 64 y/o female presents for follow up of cough/URI.  Patient treated last week with prednisone and Azithromycin, cough and sob improved however still has occasional cough, productive of sputum, some associated hoarseness and wheezes, taking albuterol infrequently (not every day), will complete prednisone in 3 days, patient reports that she is 75% improved   Review of Systems  Constitutional: Negative for fever and chills.  Respiratory: Positive for shortness of breath and wheezing.   Cardiovascular: Negative for chest pain.       Objective:   Physical Exam Vitals: reviewed Gen: pleasant female, NAD HEENT: normocephalic, PERRL, EOMI, no scleral icterus, no rhinorrhea, MMM, neck supple, no adenopathy Cardiac: RRR, S1 and S2, no murmur, no heaves/thrills Resp: CTAB, normal effort       Assessment & Plan:  Please see problem specific assessment and plan.

## 2015-01-28 NOTE — Patient Instructions (Signed)
It sounds like you have responded well to the treatment. Please complete the course of Prednisone. Return as needed.

## 2015-01-28 NOTE — Assessment & Plan Note (Signed)
Couch due to asthma exacerbation/URI has improved with Z-pack and Prednisone -patient to complete course of prednisone -continue Flovent and Albuterol

## 2015-02-17 ENCOUNTER — Encounter: Payer: Self-pay | Admitting: Family Medicine

## 2015-02-17 ENCOUNTER — Other Ambulatory Visit (HOSPITAL_COMMUNITY)
Admission: RE | Admit: 2015-02-17 | Discharge: 2015-02-17 | Disposition: A | Discharge status: Home / Self Care | Payer: 59 | Source: Ambulatory Visit | Attending: Family Medicine | Admitting: Family Medicine

## 2015-02-17 ENCOUNTER — Ambulatory Visit (HOSPITAL_COMMUNITY)
Admission: RE | Admit: 2015-02-17 | Discharge: 2015-02-17 | Disposition: A | Discharge status: Home / Self Care | Payer: 59 | Source: Ambulatory Visit | Attending: Family Medicine | Admitting: Family Medicine

## 2015-02-17 ENCOUNTER — Ambulatory Visit (INDEPENDENT_AMBULATORY_CARE_PROVIDER_SITE_OTHER): Payer: 59 | Admitting: Family Medicine

## 2015-02-17 VITALS — BP 110/78 | HR 73 | Temp 98.3°F | Ht 61.0 in | Wt 135.0 lb

## 2015-02-17 DIAGNOSIS — Z113 Encounter for screening for infections with a predominantly sexual mode of transmission: Secondary | ICD-10-CM | POA: Diagnosis present

## 2015-02-17 DIAGNOSIS — R350 Frequency of micturition: Secondary | ICD-10-CM

## 2015-02-17 DIAGNOSIS — R110 Nausea: Secondary | ICD-10-CM | POA: Insufficient documentation

## 2015-02-17 DIAGNOSIS — R1032 Left lower quadrant pain: Secondary | ICD-10-CM | POA: Insufficient documentation

## 2015-02-17 LAB — POCT URINALYSIS DIPSTICK
BILIRUBIN UA: NEGATIVE
Blood, UA: NEGATIVE
GLUCOSE UA: NEGATIVE
KETONES UA: NEGATIVE
Leukocytes, UA: NEGATIVE
NITRITE UA: NEGATIVE
PH UA: 7.5
PROTEIN UA: NEGATIVE
Spec Grav, UA: 1.015
UROBILINOGEN UA: 0.2

## 2015-02-17 LAB — POCT WET PREP (WET MOUNT): Clue Cells Wet Prep Whiff POC: NEGATIVE

## 2015-02-17 MED ORDER — METRONIDAZOLE 500 MG PO TABS: 500.0000 mg | ORAL_TABLET | Freq: Three times a day (TID) | ORAL | Status: DC

## 2015-02-17 MED ORDER — CIPROFLOXACIN HCL 500 MG PO TABS: 500.0000 mg | ORAL_TABLET | Freq: Two times a day (BID) | ORAL | Status: DC

## 2015-02-17 NOTE — Patient Instructions (Signed)
Nice to see you. We will treat you for diverticulitis with ciprofloxacin and flagyl. You will need to stick to a clear liquid diet until the abdominal discomfort improves. We will also get an X-ray of your abdomen to look for constipation.  If you develop fever, worsening abdominal pain, blood in your stool, diarrhea, nausea, vomiting, or do not improve please seek medical attention.

## 2015-02-17 NOTE — Progress Notes (Signed)
Patient ID: Diana Roach, female   DOB: December 09, 1950, 64 y.o.   MRN: [id number]  Tommi Rumps, MD Phone: [phone]  Diana Roach is a 64 y.o. female who presents today for same day appointment.  Abdominal pain: notes for the past several days has had left mid and LLQ abdominal pain. Started in upper abdomen. Hurts with movement. Is a shooting pain intermittently. Notes chills, though has not checked temperature. Some nausea. No diarrhea or vomiting. Had some dysuria this am. Some frequency. No urgency or hematuria. Denies vaginal D/C. Is not sexually active in a number of years. No periods since 64 yo. Does not some reflux intermittently. Notes a history of constipation and last BM on Friday was hard balls of stool. Intermittently takes miralax for this with benefit. States this pain feels like the last episode of diverticulitis.  PMH: former smoker. History of diverticulosis.   ROS: Per HPI   Physical Exam Filed Vitals:   02/17/15 1450  BP: 110/78  Pulse: 73  Temp: 98.3 F (36.8 C)    Gen: Well NAD HEENT: PERRL,  MMM Lungs: CTABL Nl WOB Heart: RRR  Abd: soft, reports mild discomfort on palpation of the left mid and lower portions of the abdomen, ND, no guarding or rebound GU: normal labia, normal vaginal mucosa, normal cervix, no discharge noted, normal bimanual exam Exts: Non edematous BL  LE, warm and well perfused.    Assessment/Plan: Please see individual problem list.  Tommi Rumps, MD [place] PGY-3

## 2015-02-18 LAB — CERVICOVAGINAL ANCILLARY ONLY
CHLAMYDIA, DNA PROBE: NEGATIVE
NEISSERIA GONORRHEA: NEGATIVE

## 2015-02-18 NOTE — Assessment & Plan Note (Addendum)
Patient with left sided abdominal pain and a history of diverticulosis. Likely related to diverticulitis vs constipation. UA negative for infection and blood. GU exam normal and GC/chlamydia/wet prep collected and unlikely to reveal a cause given lack of recent sexual history. No signs of acute abdomen. Well appearing on exam. Will treat for diverticulitis with cipro/flagyl and clear liquid diet at home. KUB to be obtained as well. Given return precautions. F/u in one week or sooner if worsens or not improving.   Precepted with Dr Andria Frames.

## 2015-02-20 ENCOUNTER — Telehealth: Payer: Self-pay | Admitting: Family Medicine

## 2015-02-20 NOTE — Telephone Encounter (Signed)
Informed patient of KUB results. She reports she had a BM in the past day and is taking the antibiotics and her stomach feels better. She does note that the flagyl upsets her stomach and tastes bad and would prefer to another medication. Discussed that given that she is feeling better she does not need to come in for follow-up next week unless she does not continue to improve or feels worse. Discussed return precautions.

## 2015-02-24 ENCOUNTER — Ambulatory Visit: Payer: 59 | Admitting: Family Medicine

## 2015-08-20 ENCOUNTER — Other Ambulatory Visit: Payer: Self-pay

## 2015-08-20 DIAGNOSIS — Z1231 Encounter for screening mammogram for malignant neoplasm of breast: Secondary | ICD-10-CM

## 2015-10-03 ENCOUNTER — Ambulatory Visit
Admission: RE | Admit: 2015-10-03 | Discharge: 2015-10-03 | Disposition: A | Discharge status: Home / Self Care | Payer: 59 | Source: Ambulatory Visit

## 2015-10-03 DIAGNOSIS — Z1231 Encounter for screening mammogram for malignant neoplasm of breast: Secondary | ICD-10-CM

## 2015-10-29 ENCOUNTER — Encounter: Payer: Self-pay | Admitting: Family Medicine

## 2015-10-29 ENCOUNTER — Ambulatory Visit (INDEPENDENT_AMBULATORY_CARE_PROVIDER_SITE_OTHER): Payer: BLUE CROSS/BLUE SHIELD | Admitting: Family Medicine

## 2015-10-29 VITALS — BP 129/76 | HR 73 | Temp 98.2°F | Ht 61.0 in | Wt 133.0 lb

## 2015-10-29 DIAGNOSIS — J0100 Acute maxillary sinusitis, unspecified: Secondary | ICD-10-CM

## 2015-10-29 MED ORDER — AMOXICILLIN-POT CLAVULANATE 500-125 MG PO TABS: 1.0000 | ORAL_TABLET | Freq: Three times a day (TID) | ORAL | Status: DC

## 2015-10-29 MED ORDER — FLUTICASONE PROPIONATE 50 MCG/ACT NA SUSP: 2.0000 | Freq: Every day | NASAL | Status: DC

## 2015-10-29 NOTE — Patient Instructions (Signed)
I have prescribed you antibiotics for the sinus infection. Start using Flonase intranasally daily.  Sinusitis, Adult Sinusitis is redness, soreness, and inflammation of the paranasal sinuses. Paranasal sinuses are air pockets within the bones of your face. They are located beneath your eyes, in the middle of your forehead, and above your eyes. In healthy paranasal sinuses, mucus is able to drain out, and air is able to circulate through them by way of your nose. However, when your paranasal sinuses are inflamed, mucus and air can become trapped. This can allow bacteria and other germs to grow and cause infection. Sinusitis can develop quickly and last only a short time (acute) or continue over a long period (chronic). Sinusitis that lasts for more than 12 weeks is considered chronic. CAUSES Causes of sinusitis include:  Allergies.  Structural abnormalities, such as displacement of the cartilage that separates your nostrils (deviated septum), which can decrease the air flow through your nose and sinuses and affect sinus drainage.  Functional abnormalities, such as when the small hairs (cilia) that line your sinuses and help remove mucus do not work properly or are not present. SIGNS AND SYMPTOMS Symptoms of acute and chronic sinusitis are the same. The primary symptoms are pain and pressure around the affected sinuses. Other symptoms include:  Upper toothache.  Earache.  Headache.  Bad breath.  Decreased sense of smell and taste.  A cough, which worsens when you are lying flat.  Fatigue.  Fever.  Thick drainage from your nose, which often is green and may contain pus (purulent).  Swelling and warmth over the affected sinuses. DIAGNOSIS Your health care provider will perform a physical exam. During your exam, your health care provider may perform any of the following to help determine if you have acute sinusitis or chronic sinusitis:  Look in your nose for signs of abnormal  growths in your nostrils (nasal polyps).  Tap over the affected sinus to check for signs of infection.  View the inside of your sinuses using an imaging device that has a light attached (endoscope). If your health care provider suspects that you have chronic sinusitis, one or more of the following tests may be recommended:  Allergy tests.  Nasal culture. A sample of mucus is taken from your nose, sent to a lab, and screened for bacteria.  Nasal cytology. A sample of mucus is taken from your nose and examined by your health care provider to determine if your sinusitis is related to an allergy. TREATMENT Most cases of acute sinusitis are related to a viral infection and will resolve on their own within 10 days. Sometimes, medicines are prescribed to help relieve symptoms of both acute and chronic sinusitis. These may include pain medicines, decongestants, nasal steroid sprays, or saline sprays. However, for sinusitis related to a bacterial infection, your health care provider will prescribe antibiotic medicines. These are medicines that will help kill the bacteria causing the infection. Rarely, sinusitis is caused by a fungal infection. In these cases, your health care provider will prescribe antifungal medicine. For some cases of chronic sinusitis, surgery is needed. Generally, these are cases in which sinusitis recurs more than 3 times per year, despite other treatments. HOME CARE INSTRUCTIONS  Drink plenty of water. Water helps thin the mucus so your sinuses can drain more easily.  Use a humidifier.  Inhale steam 3-4 times a day (for example, sit in the bathroom with the shower running).  Apply a warm, moist washcloth to your face 3-4 times a day,  or as directed by your health care provider.  Use saline nasal sprays to help moisten and clean your sinuses.  Take medicines only as directed by your health care provider.  If you were prescribed either an antibiotic or antifungal medicine,  finish it all even if you start to feel better. SEEK IMMEDIATE MEDICAL CARE IF:  You have increasing pain or severe headaches.  You have nausea, vomiting, or drowsiness.  You have swelling around your face.  You have vision problems.  You have a stiff neck.  You have difficulty breathing.   This information is not intended to replace advice given to you by your health care provider. Make sure you discuss any questions you have with your health care provider.   Document Released: 10/11/2005 Document Revised: 11/01/2014 Document Reviewed: 10/26/2011 Elsevier Interactive Patient Education [employer].

## 2015-10-29 NOTE — Progress Notes (Signed)
Patient ID: Diana Roach, female   DOB: Jul 10, 1951, 65 y.o.   MRN: [id number]    Subjective: CC: cough  HPI: Patient is a 65 y.o. female presenting to clinic today for a same day appt for cough.  Mid-November she started having nasal congestion, cough, runny nose, chills (no recorded fevers), and weakness. She felt like she was getting better but then the symptoms would "start all over again" after about a week. She's had 2 instances where she felt she was feeling better but then would worsen   Saturday she started feeling really bad again. The nasal congestion and facial pain were more prominent. Cough is stable. Cough is intermittently productive of a white/yellow/green mucous. Still having chills. No longer having weakness or myalgias.  Good appetite.   She used Afrin nasal spray for 3 days last week. Afrin did help with her nasal congestion.  Sudafed that she took on Saturday and yesterday did not help.  Social History: former smoker  ROS: All other systems reviewed and are negative.  Past Medical History Patient Active Problem List   Diagnosis Date Noted  . Acute sinusitis 10/30/2015  . Cough 01/11/2015  . Diverticulosis 10/01/2014  . Radiculopathy, cervical 09/04/2014  . Bilateral finger numbness 09/04/2014  . Neck swelling 05/14/2014  . RLQ abdominal pain 04/30/2014  . Abdominal pain 04/24/2014  . Left ear pain 02/14/2014  . Rotator cuff tendinitis 07/13/2013  . Left shoulder pain 07/05/2013  . Pain, arm, left 07/05/2013  . Neck nodule 05/04/2013  . Hair loss 07/19/2011  . Seborrheic keratosis 07/19/2011  . Asthma, moderate persistent 05/13/2011  . Lichen planus [id number]  . 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 Rash and nonspecific skin eruption 12/15/2010  . HYPERLIPIDEMIA 12/22/2006  . INCONTINENCE, STRESS, FEMALE 12/22/2006    Medications- reviewed and updated Current Outpatient Prescriptions  Medication Sig Dispense Refill  . albuterol (PROVENTIL HFA) 108  (90 BASE) MCG/ACT inhaler Inhale 2 puffs into the lungs every 4 (four) hours as needed. For wheezing. 8.5 g 0  . amoxicillin-clavulanate (AUGMENTIN) 500-125 MG tablet Take 1 tablet (500 mg total) by mouth 3 (three) times daily. 21 tablet 0  . Aspirin-Acetaminophen-Caffeine (GOODY HEADACHE PO) Take by mouth as needed.     Marland Kitchen atorvastatin (LIPITOR) 20 MG tablet Take 1 tablet (20 mg total) by mouth daily. 90 tablet 3  . benzonatate (TESSALON) 200 MG capsule Take 1 capsule (200 mg total) by mouth 2 (two) times daily as needed for cough. 30 capsule 0  . fluticasone (FLONASE) 50 MCG/ACT nasal spray Place 2 sprays into both nostrils daily. 16 g 0  . fluticasone (FLOVENT HFA) 110 MCG/ACT inhaler Inhale 1 puff into the lungs 2 (two) times daily. 1 Inhaler 12  . HYDROcodone-acetaminophen (NORCO/VICODIN) 5-325 MG per tablet Take 1 tablet by mouth every 6 (six) hours as needed for moderate pain. 20 tablet 0  . Ibuprofen 200 MG CAPS Take by mouth as needed.    . loratadine (CLARITIN) 10 MG tablet Take 10 mg by mouth as needed.    . polyethylene glycol (MIRALAX / GLYCOLAX) packet Take 17 g by mouth as needed. Once or twice weekly as needed    . triamcinolone (NASACORT ALLERGY 24HR) 55 MCG/ACT AERO nasal inhaler Place 2 sprays into the nose daily. 1 Inhaler 12  . zoster vaccine live, PF, (ZOSTAVAX) 91478 UNT/0.65ML injection Inject 19,400 Units into the skin once. 1 each 0   No current facility-administered medications for this visit.    Objective: Office vital  signs reviewed. BP 129/76 mmHg  Pulse 73  Temp(Src) 98.2 F (36.8 C) (Oral)  Ht 5\' 1"  (1.549 m)  Wt 133 lb (60.328 kg)  BMI 25.14 kg/m2   Physical Examination:  General: Awake, alert, well- nourished, NAD ENMT:  Right TM intact, normal light reflex, no erythema, no bulging. Left TM erythematous and bulging.  Nasal turbinates moist. MMM, Oropharynx clear without erythema or tonsillar exudate/hypertrophy. Tenderness over the maxillary sinuses.    Eyes: Conjunctiva non-injected. PERRL.  Cardio: RRR, no m/r/g noted. No pitting edema Pulm: No increased WOB.  CTAB, without wheezes, rhonchi or crackles noted.  Skin: dry, intact, no rashes or lesions   Assessment/Plan: Acute sinusitis Patient presenting with cyclic symptoms of worsening congestion/cough with some improvement and then subsequent worsening again. Given the duration and the second sickening type presentation, would treat like a bacterial acute sinusitis. Fortunately, the patient continues to eat and drink well. - Augmentin TID x 7 days. - Symptomatic treatment with Flonase daily. - Discussed hydration  - RTC precautions provided such as new symptoms or worsening symptoms.     No orders of the defined types were placed in this encounter.    Meds ordered this encounter  Medications  . amoxicillin-clavulanate (AUGMENTIN) 500-125 MG tablet    Sig: Take 1 tablet (500 mg total) by mouth 3 (three) times daily.    Dispense:  21 tablet    Refill:  0  . fluticasone (FLONASE) 50 MCG/ACT nasal spray    Sig: Place 2 sprays into both nostrils daily.    Dispense:  16 g    Refill:  [place] PGY-2, [place]

## 2015-10-30 ENCOUNTER — Telehealth: Payer: Self-pay | Admitting: Family Medicine

## 2015-10-30 DIAGNOSIS — J019 Acute sinusitis, unspecified: Secondary | ICD-10-CM | POA: Insufficient documentation

## 2015-10-30 NOTE — Telephone Encounter (Signed)
Pt would like to speak to a member of the clinical staff as she believes that she is having an allergic reaction to her recently prescribed antibiotic. Please advise at the earliest convenience. Diana Roach, ASA

## 2015-10-30 NOTE — Telephone Encounter (Signed)
Agree with coming in for an office visit in the morning and not to take the medicine again until she is seen here. Please inform patient.  Leeanne Rio, MD

## 2015-10-30 NOTE — Assessment & Plan Note (Signed)
Patient presenting with cyclic symptoms of worsening congestion/cough with some improvement and then subsequent worsening again. Given the duration and the second sickening type presentation, would treat like a bacterial acute sinusitis. Fortunately, the patient continues to eat and drink well. - Augmentin TID x 7 days. - Symptomatic treatment with Flonase daily. - Discussed hydration  - RTC precautions provided such as new symptoms or worsening symptoms.

## 2015-10-30 NOTE — Telephone Encounter (Signed)
Return call to patient regarding medication reaction.  Patient was given Rx for amoxicillin-clavulanate (AUGMENTIN) 500-125 MG tablet 10/29/15 office visit.  Patient took one tablet last night a felt "a blood vessel throbbing in her leg".  The feeling went away so she took another dose at 9 AM this morning.  The feeling came back after taking the morning dose.  She is having some nausea, denies vomiting, fever, trouble breathing.  She stated she just don't feel right.  She is also very [age].  Advised patient to not take another pill until she discussed with her doctor.  appointment scheduled for tomorrow 10/31/15 at 11:30 AM.  Advised patient if she start to feel worse to go to urgent care or ED tonight.  Will forward to PCP for further advise.  Derl Barrow, RN

## 2015-10-31 ENCOUNTER — Ambulatory Visit: Payer: BLUE CROSS/BLUE SHIELD | Admitting: Family Medicine

## 2015-11-28 ENCOUNTER — Ambulatory Visit (INDEPENDENT_AMBULATORY_CARE_PROVIDER_SITE_OTHER): Payer: BLUE CROSS/BLUE SHIELD | Admitting: Family Medicine

## 2015-11-28 VITALS — BP 136/79 | HR 80 | Temp 98.0°F | Wt 127.5 lb

## 2015-11-28 DIAGNOSIS — R300 Dysuria: Secondary | ICD-10-CM

## 2015-11-28 DIAGNOSIS — R1031 Right lower quadrant pain: Secondary | ICD-10-CM

## 2015-11-28 LAB — POCT URINALYSIS DIPSTICK
Bilirubin, UA: NEGATIVE
Blood, UA: NEGATIVE
Glucose, UA: NEGATIVE
Ketones, UA: NEGATIVE
Leukocytes, UA: NEGATIVE
Nitrite, UA: NEGATIVE
Protein, UA: NEGATIVE
Spec Grav, UA: 1.025
Urobilinogen, UA: 0.2
pH, UA: 7

## 2015-11-28 NOTE — Patient Instructions (Signed)
We will check your urine for signs of infection.  Other possibilities include diverticulitis acting up, kidney stones, constipation.  Try using a heating pad and tylenol for the pain.  If the pain gets worse, doesn't go away I do want to make sure you come back to the clinic for this issue sometime in the next 1 week.

## 2015-11-28 NOTE — Progress Notes (Signed)
   Subjective:    Patient ID: Diana Roach, female    DOB: 08-18-1951, 65 y.o.   MRN: [id number]  HPI  Patient presents for Same Day Appointment  CC: abdominal pain  # RLQ pain/back pain:  Started yesterday when she woke up, pain started in RLQ area. While at work that morning it seemed to wrap around her right flank to her spine  Pain is worse with movement: sitting to standing up, twisting her body, lifting her legs up  Pain feels like an ache, but when moving it does feel a little sharp.  She has had similar pain before, but says she can't say if this feels like diverticulitis or "kidney pain"  She endorses burning with urination that started today. She has no increased frequency, has not noticed any blood.  Says she has been seen by urology last March for issues with her kidney "swelling" and had a test that is going to be repeated this March. (chart review this is nuclear medication renal imaging) ROS: no diarrhea, no fevers, +chills  Social Hx: former smoker  Review of Systems   See HPI for ROS.   Past medical history, surgical, family, and social history reviewed and updated in the EMR as appropriate.  Objective:  BP 136/79 mmHg  Pulse 80  Temp(Src) 98 F (36.7 C) (Oral)  Wt 127 lb 8 oz (57.834 kg) Vitals and nursing note reviewed  General: no apparent distress, sitting in chair CV: normal rate, regular rhythm, 2+ radial pulses  Resp: clear to auscultation bilaterally, normal effort Abdomen: soft, mildly tender RLQ without rebound or guarding, normal bowel sounds, tympanic to percussion Back: mildly tender with some paraspinal muscle spasms on the right. No CVA tenderness   Assessment & Plan:   1. Dysuria / RLQ abdominal pain Overall reassuring exam. She has had similar issues in the past with a history of divertulosis/litis but no current bowel complaints. She does not have her appendix. UA was completely normal without hemoglobin, leuks, or nitrites. She  is not on any blood thinners. As she is tender wrapping all the way to her back with some paraspinal muscle tenderness/spasm wonder feel this likely represents MSK etiology. Recommended conservative OTC analgesia, heating pads, stretches; given return precautions and to follow up next week if not improving or symptoms worsen. Patient was agreeable to plan.

## 2016-01-24 ENCOUNTER — Telehealth: Payer: Self-pay | Admitting: Family Medicine

## 2016-01-24 NOTE — Telephone Encounter (Signed)
After hours telephone call  Patient reports longstanding problems with constipation. Constipation is worsened over the last 2 days. She's barely been able to get a small ball of stool out in the last 2 days. Over the last week, she has taken about 1-1/2 bottles of Mira lax and this hasn't worked. She reports some mild pain that is shooting down her leg this started today.  Advised patient to try an enema or a miralax clean out.  Advised that if she is having vomiting, fevers, abdominal pain, or is unable to have a stool after these interventions, she may consider coming to the emergency department. Advised that she could get follow-up with her clinic on Monday if desired.  Virginia Crews, MD, MPH PGY-2,  [facility] 01/24/2016 7:51 PM

## 2016-01-25 ENCOUNTER — Encounter (HOSPITAL_COMMUNITY): Payer: Self-pay | Admitting: *Deleted

## 2016-01-25 ENCOUNTER — Emergency Department (HOSPITAL_COMMUNITY): Payer: BLUE CROSS/BLUE SHIELD

## 2016-01-25 ENCOUNTER — Emergency Department (HOSPITAL_COMMUNITY)
Admission: EM | Admit: 2016-01-25 | Discharge: 2016-01-25 | Disposition: A | Discharge status: Home / Self Care | Payer: BLUE CROSS/BLUE SHIELD | Attending: Emergency Medicine | Admitting: Emergency Medicine

## 2016-01-25 DIAGNOSIS — Z8719 Personal history of other diseases of the digestive system: Secondary | ICD-10-CM | POA: Diagnosis not present

## 2016-01-25 DIAGNOSIS — Z79899 Other long term (current) drug therapy: Secondary | ICD-10-CM | POA: Diagnosis not present

## 2016-01-25 DIAGNOSIS — R1032 Left lower quadrant pain: Secondary | ICD-10-CM | POA: Insufficient documentation

## 2016-01-25 DIAGNOSIS — J45909 Unspecified asthma, uncomplicated: Secondary | ICD-10-CM | POA: Diagnosis not present

## 2016-01-25 DIAGNOSIS — Z7951 Long term (current) use of inhaled steroids: Secondary | ICD-10-CM | POA: Diagnosis not present

## 2016-01-25 DIAGNOSIS — Z872 Personal history of diseases of the skin and subcutaneous tissue: Secondary | ICD-10-CM | POA: Insufficient documentation

## 2016-01-25 DIAGNOSIS — R1030 Lower abdominal pain, unspecified: Secondary | ICD-10-CM

## 2016-01-25 DIAGNOSIS — Z87891 Personal history of nicotine dependence: Secondary | ICD-10-CM | POA: Diagnosis not present

## 2016-01-25 DIAGNOSIS — R109 Unspecified abdominal pain: Secondary | ICD-10-CM | POA: Diagnosis present

## 2016-01-25 DIAGNOSIS — M5432 Sciatica, left side: Secondary | ICD-10-CM | POA: Diagnosis not present

## 2016-01-25 LAB — CBC WITH DIFFERENTIAL/PLATELET
Basophils Absolute: 0 10*3/uL (ref 0.0–0.1)
Basophils Relative: 0 %
EOS PCT: 1 %
Eosinophils Absolute: 0.1 10*3/uL (ref 0.0–0.7)
HEMATOCRIT: 38.8 % (ref 36.0–46.0)
Hemoglobin: 12.5 g/dL (ref 12.0–15.0)
LYMPHS ABS: 1.9 10*3/uL (ref 0.7–4.0)
LYMPHS PCT: 19 %
MCH: 26 pg (ref 26.0–34.0)
MCHC: 32.2 g/dL (ref 30.0–36.0)
MCV: 80.8 fL (ref 78.0–100.0)
Monocytes Absolute: 0.4 10*3/uL (ref 0.1–1.0)
Monocytes Relative: 4 %
NEUTROS ABS: 7.8 10*3/uL — AB (ref 1.7–7.7)
Neutrophils Relative %: 76 %
Platelets: 387 10*3/uL (ref 150–400)
RBC: 4.8 MIL/uL (ref 3.87–5.11)
RDW: 15.5 % (ref 11.5–15.5)
WBC: 10.3 10*3/uL (ref 4.0–10.5)

## 2016-01-25 LAB — COMPREHENSIVE METABOLIC PANEL
ALK PHOS: 85 U/L (ref 38–126)
ALT: 21 U/L (ref 14–54)
ANION GAP: 11 (ref 5–15)
AST: 22 U/L (ref 15–41)
Albumin: 4 g/dL (ref 3.5–5.0)
BILIRUBIN TOTAL: 0.6 mg/dL (ref 0.3–1.2)
CALCIUM: 9.4 mg/dL (ref 8.9–10.3)
CO2: 23 mmol/L (ref 22–32)
Chloride: 106 mmol/L (ref 101–111)
Creatinine, Ser: 0.88 mg/dL (ref 0.44–1.00)
GFR calc Af Amer: >60 mL/min (ref >60)
Glucose, Bld: 100 mg/dL — ABNORMAL HIGH (ref 65–99)
POTASSIUM: 3.9 mmol/L (ref 3.5–5.1)
Sodium: 140 mmol/L (ref 135–145)
TOTAL PROTEIN: 7.2 g/dL (ref 6.5–8.1)

## 2016-01-25 LAB — URINALYSIS, ROUTINE W REFLEX MICROSCOPIC
BILIRUBIN URINE: NEGATIVE
GLUCOSE, UA: NEGATIVE mg/dL
Hgb urine dipstick: NEGATIVE
KETONES UR: NEGATIVE mg/dL
LEUKOCYTES UA: NEGATIVE
Nitrite: NEGATIVE
PROTEIN: NEGATIVE mg/dL
Specific Gravity, Urine: 1.011 (ref 1.005–1.030)
pH: 6.5 (ref 5.0–8.0)

## 2016-01-25 LAB — LIPASE, BLOOD: LIPASE: 25 U/L (ref 11–51)

## 2016-01-25 MED ORDER — BARIUM SULFATE 2.1 % PO SUSP
ORAL | Status: AC
  Administered 2016-01-25: 1 mL
  Filled 2016-01-25: qty 2

## 2016-01-25 MED ORDER — FENTANYL CITRATE (PF) 100 MCG/2ML IJ SOLN
25.0000 ug | Freq: Once | INTRAMUSCULAR | Status: AC
  Administered 2016-01-25: 25 ug via INTRAVENOUS
  Filled 2016-01-25: qty 2

## 2016-01-25 MED ORDER — HYDROCODONE-ACETAMINOPHEN 5-325 MG PO TABS: 1.0000 | ORAL_TABLET | Freq: Four times a day (QID) | ORAL | Status: DC | PRN

## 2016-01-25 MED ORDER — DICYCLOMINE HCL 20 MG PO TABS: 20.0000 mg | ORAL_TABLET | Freq: Two times a day (BID) | ORAL | Status: DC

## 2016-01-25 MED ORDER — SODIUM CHLORIDE 0.9 % IV BOLUS (SEPSIS)
1000.0000 mL | Freq: Once | INTRAVENOUS | Status: AC
  Administered 2016-01-25: 1000 mL via INTRAVENOUS

## 2016-01-25 MED ORDER — MORPHINE SULFATE (PF) 4 MG/ML IV SOLN
4.0000 mg | Freq: Once | INTRAVENOUS | Status: AC
  Administered 2016-01-25: 4 mg via INTRAVENOUS
  Filled 2016-01-25: qty 1

## 2016-01-25 NOTE — ED Notes (Signed)
Pt reports chronic constipation, pt takes Miralax, pt reports liquid stool last night after taking an enema, pt reports rectal pain that radiates into her L  Buttocks & L leg, pt denies bloody or dark colored stools, pt hx of diverticulosis, pt A&O x4, denies CP, n/v/d & SOB

## 2016-01-25 NOTE — Discharge Instructions (Signed)
Take bentyl for cramps.   Take vicodin for severe pain. Do NOT drive with it.   Take motrin for pain   See your doctor.   Return to ER if you have severe pain, trouble walking, vomiting, fever.

## 2016-01-25 NOTE — ED Notes (Signed)
Pt stable, ambulatory, states understanding of discharge instructions 

## 2016-01-25 NOTE — ED Provider Notes (Signed)
CSN: [id number]     Arrival date & time 01/25/16  [id number] History   First MD Initiated Contact with Patient 01/25/16 1150     Chief Complaint  Patient presents with  . Abdominal Pain     (Consider location/radiation/quality/duration/timing/severity/associated sxs/prior Treatment) The history is provided by the patient.  Diana Roach is a 65 y.o. female hx of asthma, diverticulitis here with L buttock pain, abdominal pain, hip pain. Patient states that she has left-sided abdominal pain as well as hip pain radiating down the buttock the last several days. She thought she was constipated so has been taking MiraLAX for several days and use enema yesterday. She states that she does have some loose stools afterwards but the pain in her back got worse. Denies any weakness or numbness. Denies any fall or injury to the back. States that similar to previous diverticulitis. Denies fever or vomiting.    Past Medical History  Diagnosis Date  . Asthma   . Lichen planus   . Environmental allergies   . Diverticulitis    Past Surgical History  Procedure Laterality Date  . Back surgery    . Appendectomy    . Colonoscopy  2015   Family History  Problem Relation Age of Onset  . Hypothyroidism Sister   . Colon cancer Neg Hx   . Pancreatic cancer Neg Hx   . Stomach cancer Neg Hx   . Esophageal cancer Neg Hx   . Cervical cancer Sister    Social History  Substance Use Topics  . Smoking status: Former Smoker    Quit date: 10/25/1996  . Smokeless tobacco: Never Used  . Alcohol Use: No   OB History    No data available     Review of Systems  Gastrointestinal: Positive for abdominal pain.  All other systems reviewed and are negative.     Allergies  Minocycline; Codeine; Contrast media; and Levaquin  Home Medications   Prior to Admission medications   Medication Sig Start Date End Date Taking? Authorizing Provider  albuterol (PROVENTIL HFA) 108 (90 BASE) MCG/ACT inhaler Inhale 2 puffs  into the lungs every 4 (four) hours as needed. For wheezing. 01/10/15  Yes Ashly Windell Moulding, DO  Aspirin-Acetaminophen-Caffeine (GOODY HEADACHE PO) Take 1 packet by mouth daily as needed. For pain   Yes Historical Provider, MD  fluticasone (FLOVENT HFA) 110 MCG/ACT inhaler Inhale 1 puff into the lungs 2 (two) times daily. 05/22/14  Yes Zenia Resides, MD  amoxicillin-clavulanate (AUGMENTIN) 500-125 MG tablet Take 1 tablet (500 mg total) by mouth 3 (three) times daily. Patient not taking: Reported on 01/25/2016 10/29/15   Archie Patten, MD  atorvastatin (LIPITOR) 20 MG tablet Take 1 tablet (20 mg total) by mouth daily. Patient not taking: Reported on 01/25/2016 10/23/14   Leeanne Rio, MD  benzonatate (TESSALON) 200 MG capsule Take 1 capsule (200 mg total) by mouth 2 (two) times daily as needed for cough. Patient not taking: Reported on 01/25/2016 01/10/15   Ashly M Gottschalk, DO  fluticasone (FLONASE) 50 MCG/ACT nasal spray Place 2 sprays into both nostrils daily. Patient not taking: Reported on 01/25/2016 10/29/15   Archie Patten, MD  HYDROcodone-acetaminophen (NORCO/VICODIN) 5-325 MG per tablet Take 1 tablet by mouth every 6 (six) hours as needed for moderate pain. Patient not taking: Reported on 01/25/2016 10/04/14   Leeanne Rio, MD  Ibuprofen 200 MG CAPS Take by mouth as needed.    Historical Provider, MD  loratadine (CLARITIN) 10  MG tablet Take 10 mg by mouth as needed. 06/11/11 06/10/12  Deneise Lever, MD  polyethylene glycol [facility] / Floria Raveling) packet Take 17 g by mouth as needed. Once or twice weekly as needed    Historical Provider, MD  triamcinolone (NASACORT ALLERGY 24HR) 55 MCG/ACT AERO nasal inhaler Place 2 sprays into the nose daily. Patient not taking: Reported on 01/25/2016 01/16/15   Mariel Aloe, MD  zoster vaccine live, PF, (ZOSTAVAX) 60454 UNT/0.65ML injection Inject 19,400 Units into the skin once. Patient not taking: Reported on 01/25/2016 05/23/14   Zenia Resides,  MD   BP 120/68 mmHg  Pulse 62  Temp(Src) 98.2 F (36.8 C) (Oral)  Resp 16  SpO2 97% Physical Exam  Constitutional: She is oriented to person, place, and time. She appears well-developed and well-nourished.  HENT:  Head: Normocephalic.  Mouth/Throat: Oropharynx is clear and moist.  Eyes: Conjunctivae are normal. Pupils are equal, round, and reactive to light.  Neck: Normal range of motion. Neck supple.  Cardiovascular: Normal rate, regular rhythm and normal heart sounds.   Pulmonary/Chest: Effort normal and breath sounds normal. No respiratory distress. She has no wheezes. She has no rales.  Abdominal: Soft.  + mild LLQ tenderness   Musculoskeletal: Normal range of motion.  Neg straight leg raise, no saddle anesthesia. No midline spinal tenderness   Neurological: She is alert and oriented to person, place, and time. No cranial nerve deficit. Coordination normal.  Skin: Skin is warm and dry.  Psychiatric: She has a normal mood and affect. Her behavior is normal. Judgment and thought content normal.  Nursing note and vitals reviewed.   ED Course  Procedures (including critical care time) Labs Review Labs Reviewed  CBC WITH DIFFERENTIAL/PLATELET - Abnormal; Notable for the following:    Neutro Abs 7.8 (*)    All other components within normal limits  COMPREHENSIVE METABOLIC PANEL - Abnormal; Notable for the following:    Glucose, Bld 100 (*)    BUN <5 (*)    All other components within normal limits  LIPASE, BLOOD  URINALYSIS, ROUTINE W REFLEX MICROSCOPIC (NOT AT [facility])    Imaging Review Ct Abdomen Pelvis Wo Contrast  01/25/2016  CLINICAL DATA:  65 year old female with left abdominal and pelvic pain for several days. EXAM: CT ABDOMEN AND PELVIS WITHOUT CONTRAST TECHNIQUE: Multidetector CT imaging of the abdomen and pelvis was performed following the standard protocol without IV contrast. COMPARISON:  10/09/2014 and prior CTs. FINDINGS: Please note that parenchymal abnormalities  may be missed without intravenous contrast. Lower chest:  No acute abnormalities. Hepatobiliary: The liver and gallbladder are unremarkable. There is no evidence biliary dilatation. Pancreas: Unremarkable Spleen: Unremarkable Adrenals/Urinary Tract: The kidneys, adrenal glands and bladder are unremarkable. There is no evidence of hydronephrosis urinary calculi. Stomach/Bowel: Colonic diverticulosis noted without evidence of diverticulitis. There is no evidence of bowel obstruction or definite bowel wall thickening. Vascular/Lymphatic: Aortic atherosclerotic calcifications noted without aneurysm. No enlarged lymph nodes identified. Reproductive: Unremarkable Other: No free fluid, focal collection or pneumoperitoneum. Musculoskeletal: No acute or suspicious abnormality. Mild degenerative disc disease at L5-S1 and L3-L4 noted. IMPRESSION: No evidence of acute abnormality. Colonic diverticulosis and aortic atherosclerosis. Electronically Signed   By: Margarette Canada M.D.   On: 01/25/2016 15:01   I have personally reviewed and evaluated these images and lab results as part of my medical decision-making.   EKG Interpretation None      MDM   Final diagnoses:  None    Diana Roach  is a 65 y.o. female here with LLQ pain, hip pain. Consider diverticulitis vs sciatica. Has hx of diverticulitis so will get labs, CT ab/pel. Neurovascular intact lower extremities.   4:24 PM CT unremarkable. Labs unremarkable. Likely sciatica vs abdominal cramping from enema use. Recommend bentyl as needed, vicodin for pain. Has GI follow up.     Wandra Arthurs, MD 01/25/16 [phone]

## 2016-01-27 ENCOUNTER — Encounter: Payer: Self-pay | Admitting: Family Medicine

## 2016-01-27 ENCOUNTER — Ambulatory Visit (INDEPENDENT_AMBULATORY_CARE_PROVIDER_SITE_OTHER): Payer: BLUE CROSS/BLUE SHIELD | Admitting: Family Medicine

## 2016-01-27 VITALS — BP 140/79 | HR 67 | Temp 98.6°F | Ht 61.0 in | Wt 130.1 lb

## 2016-01-27 DIAGNOSIS — Q6239 Other obstructive defects of renal pelvis and ureter: Secondary | ICD-10-CM

## 2016-01-27 DIAGNOSIS — Q6211 Congenital occlusion of ureteropelvic junction: Secondary | ICD-10-CM

## 2016-01-27 DIAGNOSIS — M5417 Radiculopathy, lumbosacral region: Secondary | ICD-10-CM | POA: Diagnosis not present

## 2016-01-27 MED ORDER — GABAPENTIN 100 MG PO CAPS: 100.0000 mg | ORAL_CAPSULE | Freq: Three times a day (TID) | ORAL | Status: DC

## 2016-01-27 MED ORDER — BACLOFEN 10 MG PO TABS: 10.0000 mg | ORAL_TABLET | Freq: Two times a day (BID) | ORAL | Status: DC | PRN

## 2016-01-27 NOTE — Progress Notes (Signed)
Date of Visit: 01/27/2016   HPI:  Patient presents to follow up on ED visit for leg and hip pain. Seen in ED on 4/2 for constipation and dx'd with sciatica. Had CT abdomen that was negative for acute pathology. Had DDD noted L5-S1 and L3-L4 on CT.   Has pain radiating down buttock into posterior leg, down to her foot. Feet and toes feel numb/asleep. Has history of prior lower lumbar surgery in the 1980's. Has pain in anterior groin as well. Given rx for norco, which helps the pain some. Still is very uncomfortable. No saddle anesthesia, urinary or bowel incontinence. Does have chronic constipation. No fever. Thinks left leg is a little weak.  3 weeks ago had spot of blood in her urine, none since then. Had negative UA in ED. Is supposed to follow up with urology for congenital renal pelvic abnormality about now (was told to follow up in 1 year).  ROS: See HPI.  [place]: history of asthma, hyperlipidemia, lichen planus  PHYSICAL EXAM: BP 140/79 mmHg  Pulse 67  Temp(Src) 98.6 F (37 C) (Oral)  Ht 5\' 1"  (1.549 m)  Wt 130 lb 1.6 oz (59.013 kg)  BMI 24.59 kg/m2 Gen: NAD, pleasant, cooperative Back: left lower lumbar spinal musculature quite tender to palpation  Extremities: full strength bilateral lower extremities. Sensation intact over bilateral lower extremities. No foot drop. Gait normal. Full strength with ankle inversion, eversion, plantar flexion of L foot. Negative straight leg raise bilaterally.  ASSESSMENT/PLAN:  UPJ obstruction, congenital Encouraged follow up with urology for this. Patient to call & schedule.  Lumbosacral radiculopathy No red flags. No weakness appreciated on exam today. rx gabapentin and baclofen to help with symptoms (pt reports issues with flexeril in the past) Discussed red flags, return precautions Continue norco as needed (has plenty left in her pill bottle) Follow up with me in 1-2 weeks if not improved   FOLLOW UP: Follow up as needed if symptoms  worsen or fail to improve.   Schedule follow up with urology   Delorse Limber. Ardelia Mems, [place]

## 2016-01-27 NOTE — Patient Instructions (Signed)
Continue hydrocodone as needed Sent in 2 new medicines: -baclofen - muscle relaxer -gabapentin - nerve pain medicine  Both of these medications may make you sleepy  Use heating pad on the muscle, can also try ice Can try massage  Follow up with me in 2 weeks If weakness of leg, inability to urinate/stool normally, numbness in your genital area please go to ED immediately  Be well, [name]   Lumbosacral Radiculopathy Lumbosacral radiculopathy is a condition that involves the spinal nerves and nerve roots in the low back and bottom of the spine. The condition develops when these nerves and nerve roots move out of place or become inflamed and cause symptoms. CAUSES This condition may be caused by:  Pressure from a disk that bulges out of place (herniated disk). A disk is a plate of cartilage that separates bones in the spine.  Disk degeneration.  A narrowing of the bones of the lower back (spinal stenosis).  A tumor.  An infection.  An injury that places sudden pressure on the disks that cushion the bones of your lower spine. RISK FACTORS This condition is more likely to develop in:  Males aged 30-50 years.  Females aged 50-60 years.  People who lift improperly.  People who are overweight or live a sedentary lifestyle.  People who smoke.  People who perform repetitive activities that strain the spine. SYMPTOMS Symptoms of this condition include:  Pain that goes down from the back into the legs (sciatica). This is the most common symptom. The pain may be worse with sitting, coughing, or sneezing.  Pain and numbness in the arms and legs.  Muscle weakness.  Tingling.  Loss of bladder control or bowel control. DIAGNOSIS This condition is diagnosed with a physical exam and medical history. If the pain is lasting, you may have tests, such as:  MRI scan.  X-ray.  CT scan.  Myelogram.  Nerve conduction study. TREATMENT This condition is often treated  with:  Hot packs and ice applied to affected areas.  Stretches to improve flexibility.  Exercises to strengthen back muscles.  Physical therapy.  Pain medicine.  A steroid injection in the spine. In some cases, no treatment is needed. If the condition is long-lasting (chronic), or if symptoms are severe, treatment may involve surgery or lifestyle changes, such as following a weight loss plan. HOME CARE INSTRUCTIONS Medicines  Take medicines only as directed by your health care provider.  Do not drive or operate heavy machinery while taking pain medicine. Injury Care  Apply a heat pack to the injured area as directed by your health care provider.  Apply ice to the affected area:  Put ice in a plastic bag.  Place a towel between your skin and the bag.  Leave the ice on for 20-30 minutes, every 2 hours while you are awake or as needed. Or, leave the ice on for as long as directed by your health care provider. Other Instructions  If you were shown how to do any exercises or stretches, do them as directed by your health care provider.  If your health care provider prescribed a diet or exercise program, follow it as directed.  Keep all follow-up visits as directed by your health care provider. This is important. SEEK MEDICAL CARE IF:  Your pain does not improve over time even when taking pain medicines. SEEK IMMEDIATE MEDICAL CARE IF:  Your develop severe pain.  Your pain suddenly gets worse.  You develop increasing weakness in your legs.  You lose the ability to control your bladder or bowel.  You have difficulty walking or balancing.  You have a fever.   This information is not intended to replace advice given to you by your health care provider. Make sure you discuss any questions you have with your health care provider.   Document Released: 10/11/2005 Document Revised: 02/25/2015 Document Reviewed: 10/07/2014 Elsevier Interactive Patient Education [employer]

## 2016-01-28 DIAGNOSIS — Q6211 Congenital occlusion of ureteropelvic junction: Secondary | ICD-10-CM

## 2016-01-28 DIAGNOSIS — Q6239 Other obstructive defects of renal pelvis and ureter: Secondary | ICD-10-CM | POA: Insufficient documentation

## 2016-01-28 NOTE — Assessment & Plan Note (Signed)
Encouraged follow up with urology for this. Patient to call & schedule.

## 2016-01-28 NOTE — Assessment & Plan Note (Signed)
No red flags. No weakness appreciated on exam today. rx gabapentin and baclofen to help with symptoms (pt reports issues with flexeril in the past) Discussed red flags, return precautions Continue norco as needed (has plenty left in her pill bottle) Follow up with me in 1-2 weeks if not improved

## 2016-01-29 ENCOUNTER — Telehealth: Payer: Self-pay | Admitting: *Deleted

## 2016-01-29 MED ORDER — PREDNISONE 50 MG PO TABS: 50.0000 mg | ORAL_TABLET | Freq: Every day | ORAL | Status: DC

## 2016-01-29 NOTE — Telephone Encounter (Signed)
Returned call to patient Foot and toes are still numb, able to move okay Gabapentin made pain worse Pain medicine is still helping, baclofen helping some Recommend prednisone 50mg  daily for 7 days Constipated from pain medications but urinating well Plans to take miralax Again reviewed red flags  Trial of prednisone If that doesn't help will need referral to back specialist for possible epidural injections, may need imaging as well  Leeanne Rio, MD

## 2016-01-29 NOTE — Telephone Encounter (Signed)
Patient requesting to speak with MD, states gabapentin prescribed seems to make her pain worse. Baclofen helps a little but not much. Wants to know what else can be done.

## 2016-02-02 ENCOUNTER — Encounter: Payer: Self-pay | Admitting: Family Medicine

## 2016-02-02 ENCOUNTER — Ambulatory Visit (INDEPENDENT_AMBULATORY_CARE_PROVIDER_SITE_OTHER): Payer: BLUE CROSS/BLUE SHIELD | Admitting: Family Medicine

## 2016-02-02 VITALS — BP 145/87 | HR 69 | Temp 98.2°F | Ht 61.0 in | Wt 131.8 lb

## 2016-02-02 DIAGNOSIS — M5417 Radiculopathy, lumbosacral region: Secondary | ICD-10-CM

## 2016-02-02 MED ORDER — VALACYCLOVIR HCL 1 G PO TABS: 1000.0000 mg | ORAL_TABLET | Freq: Three times a day (TID) | ORAL | Status: DC

## 2016-02-02 NOTE — Patient Instructions (Signed)
Sent in medicine to treat shingles Follow up with me in 1 week Letter to be out of work for the next week  Be well, [name]   Shingles Shingles, which is also known as herpes zoster, is an infection that causes a painful skin rash and fluid-filled blisters. Shingles is not related to genital herpes, which is a sexually transmitted infection.   Shingles only develops in people who:  Have had chickenpox.  Have received the chickenpox vaccine. (This is rare.) CAUSES Shingles is caused by varicella-zoster virus (VZV). This is the same virus that causes chickenpox. After exposure to VZV, the virus stays in the body in an inactive (dormant) state. Shingles develops if the virus reactivates. This can happen many years after the initial exposure to VZV. It is not known what causes this virus to reactivate. RISK FACTORS People who have had chickenpox or received the chickenpox vaccine are at risk for shingles. Infection is more common in people who:  Are older than age 22.  Have a weakened defense (immune) system, such as those with HIV, AIDS, or cancer.  Are taking medicines that weaken the immune system, such as transplant medicines.  Are under great stress. SYMPTOMS Early symptoms of this condition include itching, tingling, and pain in an area on your skin. Pain may be described as burning, stabbing, or throbbing. A few days or weeks after symptoms start, a painful red rash appears, usually on one side of the body in a bandlike or beltlike pattern. The rash eventually turns into fluid-filled blisters that break open, scab over, and dry up in about 2-3 weeks. At any time during the infection, you may also develop:  A fever.  Chills.  A headache.  An upset stomach. DIAGNOSIS This condition is diagnosed with a skin exam. Sometimes, skin or fluid samples are taken from the blisters before a diagnosis is made. These samples are examined under a microscope or sent to a lab for  testing. TREATMENT There is no specific cure for this condition. Your health care provider will probably prescribe medicines to help you manage pain, recover more quickly, and avoid long-term problems. Medicines may include:  Antiviral drugs.  Anti-inflammatory drugs.  Pain medicines. If the area involved is on your face, you may be referred to a specialist, such as an eye doctor (ophthalmologist) or an ear, nose, and throat (ENT) doctor to help you avoid eye problems, chronic pain, or disability. HOME CARE INSTRUCTIONS Medicines  Take medicines only as directed by your health care provider.  Apply an anti-itch or numbing cream to the affected area as directed by your health care provider. Blister and Rash Care  Take a cool bath or apply cool compresses to the area of the rash or blisters as directed by your health care provider. This may help with pain and itching.  Keep your rash covered with a loose bandage (dressing). Wear loose-fitting clothing to help ease the pain of material rubbing against the rash.  Keep your rash and blisters clean with mild soap and cool water or as directed by your health care provider.  Check your rash every day for signs of infection. These include redness, swelling, and pain that lasts or increases.  Do not pick your blisters.  Do not scratch your rash. General Instructions  Rest as directed by your health care provider.  Keep all follow-up visits as directed by your health care provider. This is important.  Until your blisters scab over, your infection can cause chickenpox  in people who have never had it or been vaccinated against it. To prevent this from happening, avoid contact with other people, especially:  Babies.  Pregnant women.  Children who have eczema.  Elderly people who have transplants.  People who have chronic illnesses, such as leukemia or AIDS. SEEK MEDICAL CARE IF:  Your pain is not relieved with prescribed  medicines.  Your pain does not get better after the rash heals.  Your rash looks infected. Signs of infection include redness, swelling, and pain that lasts or increases. SEEK IMMEDIATE MEDICAL CARE IF:  The rash is on your face or nose.  You have facial pain, pain around your eye area, or loss of feeling on one side of your face.  You have ear pain or you have ringing in your ear.  You have loss of taste.  Your condition gets worse.   This information is not intended to replace advice given to you by your health care provider. Make sure you discuss any questions you have with your health care provider.   Document Released: 10/11/2005 Document Revised: 11/01/2014 Document Reviewed: 08/22/2014 Elsevier Interactive Patient Education [employer].

## 2016-02-04 NOTE — Progress Notes (Signed)
Date of Visit: 02/02/2016   HPI:  Patient presents for follow up of left hip/buttock pain. I saw her last on 4/4 and thought it was lumbosacral radiculopathy related to muscle spasm. rx'd baclofen and gabapentin.   On 4/6 she called saying baclofen made symptoms worse. We instead prescribed prednisone.  Reports since then pain in left hip has improved. She has however since begun noticing a rash on her foot. Has tingling and pain and numbness in foot and calf. Wonders if she has shingles. No fever but had some chills last night. Is still getting new lesions on foot.   No lower extremity weakness, problems with stooling/urination, no saddle anesthesia.  ROS: See HPI.  [place]: history of hyperlipidemia, stress incontinence, congenital UPJ obstruction  PHYSICAL EXAM: BP 145/87 mmHg  Pulse 69  Temp(Src) 98.2 F (36.8 C) (Oral)  Ht 5\' 1"  (1.549 m)  Wt 131 lb 12.8 oz (59.784 kg)  BMI 24.92 kg/m2 Gen: NAD, pleasant cooperative HEENT: ncat Lungs: normal work of breathing Back: mild tender to palpation left lower lumbar area/buttock in musculature Ext: full strength left lower extremity with foot plantarflexion/dorsiflexion/inversion/eversion. Full strength with knee extension & flexion, and hip flexion, adduction, abduction. 2+ dp pulse on left. Slight diminished sensation of left lower extremity/foot. Skin: vesicular erythematous rash to dorsum of left foot (see photo below)     ASSESSMENT/PLAN:  Lumbosacral radiculopathy Hip symptoms improved, but now has rash on foot that is suggestive of possible shingles. In dermatomal distribution Continue baclofen, prednisone. Add valtrex. Follow up with me in 1 week, sooner if worsening. Letter given for work.   FOLLOW UP: Follow up in 1 week for shingles/radicular leg pain  [name], [place]

## 2016-02-04 NOTE — Assessment & Plan Note (Addendum)
Hip symptoms improved, but now has rash on foot that is suggestive of possible shingles. In dermatomal distribution Continue baclofen, prednisone. Add valtrex. Follow up with me in 1 week, sooner if worsening. Letter given for work.

## 2016-02-09 ENCOUNTER — Ambulatory Visit
Admission: RE | Admit: 2016-02-09 | Discharge: 2016-02-09 | Disposition: A | Discharge status: Home / Self Care | Payer: BLUE CROSS/BLUE SHIELD | Source: Ambulatory Visit | Attending: Family Medicine | Admitting: Family Medicine

## 2016-02-09 ENCOUNTER — Encounter: Payer: Self-pay | Admitting: Family Medicine

## 2016-02-09 ENCOUNTER — Ambulatory Visit (INDEPENDENT_AMBULATORY_CARE_PROVIDER_SITE_OTHER): Payer: BLUE CROSS/BLUE SHIELD | Admitting: Family Medicine

## 2016-02-09 VITALS — BP 142/69 | HR 64 | Temp 98.7°F | Ht 61.0 in | Wt 134.0 lb

## 2016-02-09 DIAGNOSIS — M5417 Radiculopathy, lumbosacral region: Secondary | ICD-10-CM

## 2016-02-09 MED ORDER — BACLOFEN 10 MG PO TABS: 10.0000 mg | ORAL_TABLET | Freq: Two times a day (BID) | ORAL | Status: DC | PRN

## 2016-02-09 MED ORDER — TRAMADOL HCL 50 MG PO TABS: 50.0000 mg | ORAL_TABLET | Freq: Three times a day (TID) | ORAL | Status: DC | PRN

## 2016-02-09 NOTE — Progress Notes (Signed)
Date of Visit: 02/09/2016   HPI:  Patient presents to follow up on L hip pain.  Symptoms present since April 5. Thought she was getting some better, but has been worse over the last 24 hours. Has pain going down her leg from her buttock down to her toes. Toes feel numb. Leg aches. Taking tylenol and ibuprofen without relief. Had been given rx for norco by the ED and reports having just one of those tabs left that she's saving for when the pain gets very bad. Stopped baclofen on her own though does note this helped the pain. Has completed prednisone course. I had also prescribed her valtrex for what seems to be a shingles outbreak on her left foot. Does not have pain in her back, it all originates around her left buttock and radiates downward.  Stooling and urinating normally. Denies saddle anesthesia or fever. Thinks her leg is somewhat weak from pain.  ROS: See HPI.  [place]: history of hyperlipidemia, asthma  PHYSICAL EXAM: BP 142/69 mmHg  Pulse 64  Temp(Src) 98.7 F (37.1 C) (Oral)  Ht 5\' 1"  (1.549 m)  Wt 134 lb (60.782 kg)  BMI 25.33 kg/m2 Gen: NAD, pleasant, cooperative HEENT: normocephalic, atraumatic  Extremities: full strength bilateral lower extremities with hip abduction, adduction, and flexion. Full strength with left ankle dorsi & plantar flexion ,inversion, and eversion. Able to move toes. 2+ DP pulse on L. Healing erosions present on dorsal surface of foot. Sensation intact to light touch over left foot. Back: quite tender to palpation over left aspect of lumbar spine/sacrum in perispinal muscles. No definite bony tenderness.  ASSESSMENT/PLAN:  Lumbosacral radiculopathy Persists. No weakness present on exam today. Tenderness of perispinal muscles again suggests muscle spasm leading to radiculopathy. Will obtain xray to ensure no bony abnormality. Refill baclofen as patient thinks this helped. Also given rx for tramadol, cautioned on sedation. Out of work for the next week  (pt not able to work with this pain). Follow up with me in 1 week. If no improvement at that time will likely pursue advanced imaging with MRI.   FOLLOW UP: Follow up in 1 week for lumbosacral radiculopathy  [name], [place]

## 2016-02-09 NOTE — Patient Instructions (Signed)
Get back on baclofen  Giving you prescription for tramadol Use caution as this and baclofen might make you sleepy  Out of work for another week Go get xray  See exercises below  Follow up with me in 1 week  Be well, [name]   Lumbosacral Radiculopathy Lumbosacral radiculopathy is a condition that involves the spinal nerves and nerve roots in the low back and bottom of the spine. The condition develops when these nerves and nerve roots move out of place or become inflamed and cause symptoms. CAUSES This condition may be caused by: 1. Pressure from a disk that bulges out of place (herniated disk). A disk is a plate of cartilage that separates bones in the spine. 2. Disk degeneration. 3. A narrowing of the bones of the lower back (spinal stenosis). 4. A tumor. 5. An infection. 6. An injury that places sudden pressure on the disks that cushion the bones of your lower spine. RISK FACTORS This condition is more likely to develop in: 1. Males aged 30-50 years. 2. Females aged 15-60 years. 3. People who lift improperly. 4. People who are overweight or live a sedentary lifestyle. 5. People who smoke. 6. People who perform repetitive activities that strain the spine. SYMPTOMS Symptoms of this condition include: 1. Pain that goes down from the back into the legs (sciatica). This is the most common symptom. The pain may be worse with sitting, coughing, or sneezing. 2. Pain and numbness in the arms and legs. 3. Muscle weakness. 4. Tingling. 5. Loss of bladder control or bowel control. DIAGNOSIS This condition is diagnosed with a physical exam and medical history. If the pain is lasting, you may have tests, such as: 1. MRI scan. 2. X-ray. 3. CT scan. 4. Myelogram. 5. Nerve conduction study. TREATMENT This condition is often treated with: 1. Hot packs and ice applied to affected areas. 2. Stretches to improve flexibility. 3. Exercises to strengthen back muscles. 4. Physical  therapy. 5. Pain medicine. 6. A steroid injection in the spine. In some cases, no treatment is needed. If the condition is long-lasting (chronic), or if symptoms are severe, treatment may involve surgery or lifestyle changes, such as following a weight loss plan. HOME CARE INSTRUCTIONS Medicines 1. Take medicines only as directed by your health care provider. 2. Do not drive or operate heavy machinery while taking pain medicine. Injury Care 1. Apply a heat pack to the injured area as directed by your health care provider. 2. Apply ice to the affected area: 1. Put ice in a plastic bag. 2. Place a towel between your skin and the bag. 3. Leave the ice on for 20-30 minutes, every 2 hours while you are awake or as needed. Or, leave the ice on for as long as directed by your health care provider. Other Instructions  If you were shown how to do any exercises or stretches, do them as directed by your health care provider.  If your health care provider prescribed a diet or exercise program, follow it as directed.  Keep all follow-up visits as directed by your health care provider. This is important. SEEK MEDICAL CARE IF:  Your pain does not improve over time even when taking pain medicines. SEEK IMMEDIATE MEDICAL CARE IF:  Your develop severe pain.  Your pain suddenly gets worse.  You develop increasing weakness in your legs.  You lose the ability to control your bladder or bowel.  You have difficulty walking or balancing.  You have a fever.  This information is not intended to replace advice given to you by your health care provider. Make sure you discuss any questions you have with your health care provider.   Document Released: 10/11/2005 Document Revised: 02/25/2015 Document Reviewed: 10/07/2014 Elsevier Interactive Patient Education 2016 [employer]  Back Exercises The following exercises strengthen the muscles that help to support the back. They also help to keep the  lower back flexible. Doing these exercises can help to prevent back pain or lessen existing pain. If you have back pain or discomfort, try doing these exercises 2-3 times each day or as told by your health care provider. When the pain goes away, do them once each day, but increase the number of times that you repeat the steps for each exercise (do more repetitions). If you do not have back pain or discomfort, do these exercises once each day or as told by your health care provider. EXERCISES Single Knee to Chest Repeat these steps 3-5 times for each leg: 7. Lie on your back on a firm bed or the floor with your legs extended. 8. Bring one knee to your chest. Your other leg should stay extended and in contact with the floor. 9. Hold your knee in place by grabbing your knee or thigh. 10. Pull on your knee until you feel a gentle stretch in your lower back. 11. Hold the stretch for 10-30 seconds. 12. Slowly release and straighten your leg. Pelvic Tilt Repeat these steps 5-10 times: 7. Lie on your back on a firm bed or the floor with your legs extended. 8. Bend your knees so they are pointing toward the ceiling and your feet are flat on the floor. 9. Tighten your lower abdominal muscles to press your lower back against the floor. This motion will tilt your pelvis so your tailbone points up toward the ceiling instead of pointing to your feet or the floor. 10. With gentle tension and even breathing, hold this position for 5-10 seconds. Cat-Cow Repeat these steps until your lower back becomes more flexible: 6. Get into a hands-and-knees position on a firm surface. Keep your hands under your shoulders, and keep your knees under your hips. You may place padding under your knees for comfort. 7. Let your head hang down, and point your tailbone toward the floor so your lower back becomes rounded like the back of a cat. 8. Hold this position for 5 seconds. 9. Slowly lift your head and point your tailbone up  toward the ceiling so your back forms a sagging arch like the back of a cow. 10. Hold this position for 5 seconds. Press-Ups Repeat these steps 5-10 times: 6. Lie on your abdomen (face-down) on the floor. 7. Place your palms near your head, about shoulder-width apart. 8. While you keep your back as relaxed as possible and keep your hips on the floor, slowly straighten your arms to raise the top half of your body and lift your shoulders. Do not use your back muscles to raise your upper torso. You may adjust the placement of your hands to make yourself more comfortable. 9. Hold this position for 5 seconds while you keep your back relaxed. 10. Slowly return to lying flat on the floor. Bridges Repeat these steps 10 times: 7. Lie on your back on a firm surface. 8. Bend your knees so they are pointing toward the ceiling and your feet are flat on the floor. 9. Tighten your buttocks muscles and lift your buttocks off of the floor until  your waist is at almost the same height as your knees. You should feel the muscles working in your buttocks and the back of your thighs. If you do not feel these muscles, slide your feet 1-2 inches farther away from your buttocks. 10. Hold this position for 3-5 seconds. 11. Slowly lower your hips to the starting position, and allow your buttocks muscles to relax completely. If this exercise is too easy, try doing it with your arms crossed over your chest. Abdominal Crunches Repeat these steps 5-10 times: 3. Lie on your back on a firm bed or the floor with your legs extended. 4. Bend your knees so they are pointing toward the ceiling and your feet are flat on the floor. 5. Cross your arms over your chest. 6. Tip your chin slightly toward your chest without bending your neck. 7. Tighten your abdominal muscles and slowly raise your trunk (torso) high enough to lift your shoulder blades a tiny bit off of the floor. Avoid raising your torso higher than that, because it can  put too much stress on your low back and it does not help to strengthen your abdominal muscles. 8. Slowly return to your starting position. Back Lifts Repeat these steps 5-10 times: 3. Lie on your abdomen (face-down) with your arms at your sides, and rest your forehead on the floor. 4. Tighten the muscles in your legs and your buttocks. 5. Slowly lift your chest off of the floor while you keep your hips pressed to the floor. Keep the back of your head in line with the curve in your back. Your eyes should be looking at the floor. 6. Hold this position for 3-5 seconds. 7. Slowly return to your starting position. SEEK MEDICAL CARE IF:  Your back pain or discomfort gets much worse when you do an exercise.  Your back pain or discomfort does not lessen within 2 hours after you exercise. If you have any of these problems, stop doing these exercises right away. Do not do them again unless your health care provider says that you can. SEEK IMMEDIATE MEDICAL CARE IF:  You develop sudden, severe back pain. If this happens, stop doing the exercises right away. Do not do them again unless your health care provider says that you can.   This information is not intended to replace advice given to you by your health care provider. Make sure you discuss any questions you have with your health care provider.   Document Released: 11/18/2004 Document Revised: 07/02/2015 Document Reviewed: 12/05/2014 Elsevier Interactive Patient Education [employer].

## 2016-02-09 NOTE — Assessment & Plan Note (Addendum)
Persists. No weakness present on exam today. Tenderness of perispinal muscles again suggests muscle spasm leading to radiculopathy. Will obtain xray to ensure no bony abnormality. Refill baclofen as patient thinks this helped. Also given rx for tramadol, cautioned on sedation. Out of work for the next week (pt not able to work with this pain). Follow up with me in 1 week. If no improvement at that time will likely pursue advanced imaging with MRI.

## 2016-02-10 ENCOUNTER — Ambulatory Visit: Payer: BLUE CROSS/BLUE SHIELD | Admitting: Family Medicine

## 2016-02-16 ENCOUNTER — Encounter: Payer: Self-pay | Admitting: Family Medicine

## 2016-02-16 ENCOUNTER — Ambulatory Visit (HOSPITAL_COMMUNITY)
Admission: RE | Admit: 2016-02-16 | Discharge: 2016-02-16 | Disposition: A | Discharge status: Home / Self Care | Payer: BLUE CROSS/BLUE SHIELD | Source: Ambulatory Visit | Attending: Cardiovascular Disease | Admitting: Cardiovascular Disease

## 2016-02-16 ENCOUNTER — Ambulatory Visit (INDEPENDENT_AMBULATORY_CARE_PROVIDER_SITE_OTHER): Payer: BLUE CROSS/BLUE SHIELD | Admitting: Family Medicine

## 2016-02-16 VITALS — BP 135/83 | HR 66 | Temp 98.4°F | Ht 61.0 in | Wt 135.8 lb

## 2016-02-16 DIAGNOSIS — M79605 Pain in left leg: Secondary | ICD-10-CM | POA: Diagnosis not present

## 2016-02-16 DIAGNOSIS — M5417 Radiculopathy, lumbosacral region: Secondary | ICD-10-CM

## 2016-02-16 NOTE — Progress Notes (Signed)
Date of Visit: 02/16/2016   HPI:  Presents for follow up of hip/back/leg pain.  Last visit 4/17 - got xray of lumbar spine (normal). Refilled baclofen. Gave rx for tramadol.  Reports pain is some improved. Still has pain but it's overall better. Toes still numb. Has regained sensation in fifth toe on L foot. No weakness. Left lower back still tender. Has not used heating pad much. Only took tramadol once. Has not taken baclofen in 2 days. Feels ready to try going back to work. Has noted that she has veins in left leg that are more swollen since this all began. This is the first she's mentioned it to me.   ROS: See HPI.  [place]: history of hyperlipidemia, asthma   PHYSICAL EXAM: BP 135/83 mmHg  Pulse 66  Temp(Src) 98.4 F (36.9 C) (Oral)  Ht 5\' 1"  (1.549 m)  Wt 135 lb 12.8 oz (61.598 kg)  BMI 25.67 kg/m2 Gen: NAD, pleasant, cooperative Back: left lower lumbar area still tender to palpation in musculature Extremities: bilateral lower extremities with 5/5 strength with knee flexion & extension, hip abduction & flexion. L foot with 5/5 strength with ankle dorsiflexion/plantarflexion/inversion/eversion. Prior area of blisters on dorsum of L foot healing well. Sensation intact over feet. Left leg with superficial venous distension noted. No palpable cords. No erythema or swelling appreciated. Negative homans.       ASSESSMENT/PLAN:  Lumbosacral radiculopathy Improving clinically. Given noted venous distension in this leg, warrants ultrasound doppler to rule out DVT. No swelling or erythema, so I doubt this and it would certainly be an atypical presentation. Doppler ordered & scheduled. Otherwise patient can return to work. She will follow up with me in 2 weeks.    FOLLOW UP: Follow up in 2 weeks for above issues.  [place]. Ardelia Mems, [place]

## 2016-02-16 NOTE — Patient Instructions (Signed)
Getting ultrasound of your leg to be sure no blood clot Ok to go back to work  Follow up with me in 2 weeks, sooner if not improving still  Try heating pad on your back Do the exercises I gave you before  Be well, [name]

## 2016-02-17 NOTE — Assessment & Plan Note (Signed)
Improving clinically. Given noted venous distension in this leg, warrants ultrasound doppler to rule out DVT. No swelling or erythema, so I doubt this and it would certainly be an atypical presentation. Doppler ordered & scheduled. Otherwise patient can return to work. She will follow up with me in 2 weeks.

## 2016-03-05 ENCOUNTER — Encounter: Payer: Self-pay | Admitting: Family Medicine

## 2016-03-05 ENCOUNTER — Ambulatory Visit (INDEPENDENT_AMBULATORY_CARE_PROVIDER_SITE_OTHER): Payer: BLUE CROSS/BLUE SHIELD | Admitting: Family Medicine

## 2016-03-05 VITALS — BP 119/74 | HR 67 | Temp 97.8°F | Ht 61.0 in | Wt 134.0 lb

## 2016-03-05 DIAGNOSIS — J4540 Moderate persistent asthma, uncomplicated: Secondary | ICD-10-CM

## 2016-03-05 DIAGNOSIS — M5417 Radiculopathy, lumbosacral region: Secondary | ICD-10-CM | POA: Diagnosis not present

## 2016-03-05 DIAGNOSIS — I8390 Asymptomatic varicose veins of unspecified lower extremity: Secondary | ICD-10-CM

## 2016-03-05 DIAGNOSIS — I8393 Asymptomatic varicose veins of bilateral lower extremities: Secondary | ICD-10-CM

## 2016-03-05 MED ORDER — LORATADINE 10 MG PO TABS: 10.0000 mg | ORAL_TABLET | Freq: Every day | ORAL | Status: DC | PRN

## 2016-03-05 MED ORDER — FLUTICASONE PROPIONATE HFA 110 MCG/ACT IN AERO: 1.0000 | INHALATION_SPRAY | Freq: Two times a day (BID) | RESPIRATORY_TRACT | Status: DC

## 2016-03-05 NOTE — Patient Instructions (Signed)
I'll refer you to a vein specialist. Someone will call with that appointment   Sent in refill on flovent Also sent in claritin  Follow up with me as needed  Be well, [name]

## 2016-03-05 NOTE — Progress Notes (Signed)
Date of Visit: 03/05/2016   HPI:  Patient presents to follow up on hip/leg pain.  Overall doing much better. Pain nearly gone. Does have large veins in her legs that sometimes hurt. Was negative for DVT at last ultrasound.  Asthma - needs flovent refilled. Rarely uses albuterol.  Allergies - having flare with runny nose. Requests claritin refill  ROS: See HPI.  [place]: history of hyperlipidemia, asthma   PHYSICAL EXAM: BP 119/74 mmHg  Pulse 67  Temp(Src) 97.8 F (36.6 C) (Oral)  Ht 5\' 1"  (1.549 m)  Wt 134 lb (60.782 kg)  BMI 25.33 kg/m2 Gen: no acute distress, pleasant, cooperative HEENT: normocephalic, atraumatic, moist mucous membranes. Nares patent. Tympanic membranes clear bilaterally. No anterior cervical or supraclavicular lymphadenopathy.  Heart: regular rate and rhythm, no murmur Lungs: clear to auscultation bilaterally, normal work of breathing  Neuro: alert, grossly nonfocal, speech normal Ext: legs nontender to palpation. Full strength bilateral lower extremities. Some enlarged veins on legs, but no palpable cords, erythema, or swelling.  ASSESSMENT/PLAN:  Asthma, moderate persistent Well controlled. Continue current regimen. Refill flovent & claritin today.  Lumbosacral radiculopathy Resolved. Follow up as needed   Varicose vein of leg Refer to vascular surgery per patient request   FOLLOW UP: Follow up as needed if symptoms worsen or fail to improve.  Referring to vascular surgery  Diana Roach, [place]

## 2016-03-09 DIAGNOSIS — I8390 Asymptomatic varicose veins of unspecified lower extremity: Secondary | ICD-10-CM | POA: Insufficient documentation

## 2016-03-09 NOTE — Assessment & Plan Note (Addendum)
Well controlled. Continue current regimen. Refill flovent & claritin today.

## 2016-03-09 NOTE — Assessment & Plan Note (Signed)
Resolved.  Follow up as needed.

## 2016-03-09 NOTE — Assessment & Plan Note (Signed)
Refer to vascular surgery per patient request

## 2016-03-19 ENCOUNTER — Other Ambulatory Visit: Payer: Self-pay | Admitting: *Deleted

## 2016-03-19 DIAGNOSIS — I83813 Varicose veins of bilateral lower extremities with pain: Secondary | ICD-10-CM

## 2016-03-19 DIAGNOSIS — Z86718 Personal history of other venous thrombosis and embolism: Secondary | ICD-10-CM

## 2016-04-22 ENCOUNTER — Ambulatory Visit (HOSPITAL_COMMUNITY)
Admission: RE | Admit: 2016-04-22 | Discharge: 2016-04-22 | Disposition: A | Discharge status: Home / Self Care | Payer: BLUE CROSS/BLUE SHIELD | Source: Ambulatory Visit | Attending: Family Medicine | Admitting: Family Medicine

## 2016-04-22 ENCOUNTER — Ambulatory Visit (INDEPENDENT_AMBULATORY_CARE_PROVIDER_SITE_OTHER): Payer: BLUE CROSS/BLUE SHIELD | Admitting: Family Medicine

## 2016-04-22 ENCOUNTER — Encounter: Payer: Self-pay | Admitting: Family Medicine

## 2016-04-22 VITALS — BP 120/63 | HR 68 | Temp 98.1°F | Wt 136.0 lb

## 2016-04-22 DIAGNOSIS — R079 Chest pain, unspecified: Secondary | ICD-10-CM | POA: Diagnosis present

## 2016-04-22 DIAGNOSIS — R0602 Shortness of breath: Secondary | ICD-10-CM

## 2016-04-22 DIAGNOSIS — Z114 Encounter for screening for human immunodeficiency virus [HIV]: Secondary | ICD-10-CM | POA: Diagnosis not present

## 2016-04-22 DIAGNOSIS — R001 Bradycardia, unspecified: Secondary | ICD-10-CM | POA: Diagnosis not present

## 2016-04-22 LAB — D-DIMER, QUANTITATIVE (NOT AT ARMC): D DIMER QUANT: 0.32 ug{FEU}/mL (ref 0.00–0.50)

## 2016-04-22 LAB — POCT HEMOGLOBIN: Hemoglobin: 12.9 g/dL (ref 12.2–16.2)

## 2016-04-22 MED ORDER — PREDNISONE 10 MG PO TABS: 10.0000 mg | ORAL_TABLET | Freq: Every day | ORAL | Status: DC

## 2016-04-22 NOTE — Progress Notes (Signed)
Date of Visit: 04/22/2016   HPI:  Patient presents for a same day appointment to discuss flare of asthma.  On Tuesday started feeling shortness of breath. Has had productive cough. Nose is itching. Chest feels tight. Also has pain in R upper back with inspiration. No fever. Eating and drinking well. Stooling and urinating normally.. Feels herself wheezing. Chest tightness not worse with exertion. Used albuterol some yesterday which helped.   ROS: See HPI  [place]: history of asthma, hyperlipidemia  PHYSICAL EXAM: BP 120/63 mmHg  Pulse 68  Temp(Src) 98.1 F (36.7 C) (Oral)  Wt 136 lb (61.689 kg)  SpO2 99% Gen: NAD, pleasant, cooperative HEENT: normocephalic, atraumatic, moist mucous membranes, nares patent, oropharynx clear and moist, No anterior cervical or supraclavicular lymphadenopathy.  Heart:  regular rate and rhythm, no murmur Lungs: clear to auscultation bilaterally, normal work of breathing, no wheezes or crackles Abdomen: soft nontender to palpation  Neuro: alert grossly nonfocal, speech normal Extremities: No appreciable lower extremity edema bilaterally   ASSESSMENT/PLAN:  1. Dyspnea - suspect flare of asthma though not wheezing currently. Benign exam overall, no hypoxia or distress. EKG performed today due to age and report of chest tightness, but shows no signs of acute ischemia. Suspect chest pain related to coughing. - Will check stat d-dimer to rule out PE given inspiratory chest/back pain.  - start prednisne 10mg  daily for 5 days (pt states cannot tolerate higher dose than this) - schedule flonase & claritin, previously taking sporadically - follow up next week if not improved, sooner to ER if worsening - patient agreeable to this plan  FOLLOW UP: Follow up as needed if symptoms worsen or fail to improve.    [place]. Ardelia Mems, [place]

## 2016-04-22 NOTE — Patient Instructions (Signed)
Checking blood work: d-dimer, this will help [place] rule out a blood clot if it is negative  Not 100% sure what's causing you to feel shortness of breath Start claritin & flonase every day - treats allergies Do prednisone 10mg  daily for 5 days - treats asthma  If not better next week come see [place] If worsening go to ER this weekend  Be well, [name]

## 2016-04-23 LAB — HIV ANTIBODY (ROUTINE TESTING W REFLEX): HIV: NONREACTIVE

## 2016-06-15 ENCOUNTER — Encounter: Payer: BLUE CROSS/BLUE SHIELD | Admitting: Vascular Surgery

## 2016-06-15 ENCOUNTER — Encounter (HOSPITAL_COMMUNITY): Payer: BLUE CROSS/BLUE SHIELD

## 2016-08-11 ENCOUNTER — Encounter: Payer: Self-pay | Admitting: Family Medicine

## 2016-08-11 ENCOUNTER — Ambulatory Visit (INDEPENDENT_AMBULATORY_CARE_PROVIDER_SITE_OTHER): Payer: PPO | Admitting: Family Medicine

## 2016-08-11 VITALS — BP 128/74 | HR 60 | Temp 98.0°F | Ht 61.0 in | Wt 136.6 lb

## 2016-08-11 DIAGNOSIS — Z23 Encounter for immunization: Secondary | ICD-10-CM | POA: Diagnosis not present

## 2016-08-11 DIAGNOSIS — R109 Unspecified abdominal pain: Secondary | ICD-10-CM

## 2016-08-11 LAB — POCT URINALYSIS DIPSTICK
Bilirubin, UA: NEGATIVE
Blood, UA: NEGATIVE
GLUCOSE UA: NEGATIVE
Ketones, UA: NEGATIVE
LEUKOCYTES UA: NEGATIVE
Nitrite, UA: NEGATIVE
PROTEIN UA: NEGATIVE
Spec Grav, UA: 1.015
UROBILINOGEN UA: 0.2
pH, UA: 6.5

## 2016-08-11 NOTE — Patient Instructions (Addendum)
Contact your urologist to get an ultrasound

## 2016-08-11 NOTE — Progress Notes (Signed)
Subjective: CC: Concerns for right flank pain HPI: Patient is a 65 y.o. female with a past medical history of congenital UPJ obstruction presenting to clinic today for a same-day appointment with right flank pain..  She notes right sided flank pain for the last 3 days. Not as bad today as it has been. She's had chills, no fevers. This started 2 days ago. No N/V. She endorses baseline constipation with diarrhea yesterday. She notes loose stool (but not watery). No melena or hematochezia. Nothing new in diet. No dysuria. Possibly some urinary frequency. No urinary urgency.  Movement makes the pain worse.  Eating does not worsen symptoms.  She feels as though her kidney may be "swollen" She intermittently sees urology.  Social History: non smoker  Health Maintenance: due for colonoscopy   ROS: All other systems reviewed and are negative: besides also intermittently having some toe numbness bilaterally and more fatigue over the last several months with no other neurologic changes.  Past Medical History Patient Active Problem List   Diagnosis Date Noted  . Right flank pain 08/12/2016  . Varicose vein of leg 03/09/2016  . UPJ obstruction, congenital 01/28/2016  . Lumbosacral radiculopathy 01/27/2016  . Acute sinusitis 10/30/2015  . Diverticulosis 10/01/2014  . Radiculopathy, cervical 09/04/2014  . Bilateral finger numbness 09/04/2014  . RLQ abdominal pain 04/30/2014  . Abdominal pain 04/24/2014  . Rotator cuff tendinitis 07/13/2013  . Neck nodule 05/04/2013  . Asthma, moderate persistent 05/13/2011  . Lichen planus [id number]  . 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 Rash and nonspecific skin eruption 12/15/2010  . HYPERLIPIDEMIA 12/22/2006  . INCONTINENCE, STRESS, FEMALE 12/22/2006    Medications- reviewed and updated  Objective: Office vital signs reviewed. BP 128/74   Pulse 60   Temp 98 F (36.7 C) (Oral)   Ht 5\' 1"  (1.549 m)   Wt 136 lb 9.6 oz (62 kg)   BMI  25.81 kg/m    Physical Examination:  General: Awake, alert, well- nourished, NAD GI: +BS, soft, non-distended. Tenderness in the suprapubic region over the bladder and over the R flank. No HSM. No rebound or guarding. No CVA tenderness.  Skin: dry, intact, no rashes or lesions over exposed skin. No pitting edema.   Urinalysis    Component Value Date/Time   COLORURINE YELLOW 01/25/2016 [place] 01/25/2016 1235   LABSPEC 1.011 01/25/2016 1235   PHURINE 6.5 01/25/2016 1235   GLUCOSEU NEGATIVE 01/25/2016 1235   HGBUR NEGATIVE 01/25/2016 1235   HGBUR trace-lysed 07/01/2009 0826   BILIRUBINUR negative 08/11/2016 1146   KETONESUR NEGATIVE 01/25/2016 1235   PROTEINUR negative 08/11/2016 1146   PROTEINUR NEGATIVE 01/25/2016 1235   UROBILINOGEN 0.2 08/11/2016 1146   UROBILINOGEN 0.2 07/01/2009 0826   NITRITE negative 08/11/2016 1146   NITRITE NEGATIVE 01/25/2016 1235   LEUKOCYTESUR Negative 08/11/2016 1146      Assessment/Plan: Right flank pain The patient is presenting with R flank pain consistent with other times when her "kidney has been swollen"  She has a h/o UPJ obstruction and sees urology. U/A unremarkable, less likely stone or pyelonephritis.  - will get BMET to assess for AKI in case there was some sort of obstruction.  - discussed obtaining renal u/s to assess for hydronephrosis. Patient would like this done by her urologist. I agreed continuity is important- if she cannot get in with them within the next day or 2, discussed calling our office so I can set up an U/S - strict return precautions such  as urinary retention, inability to urinate, worsening pain, fevers, chills, N/V, hematuria, dysuria. Pt voiced understanding.  - advised to f/u with PCP in 2-4 weeks     Orders Placed This Encounter  Procedures  . Flu Vaccine QUAD 36+ mos IM  . COMPLETE METABOLIC PANEL WITH GFR  . POCT urinalysis dipstick    No orders of the defined types were placed in  this encounter.   Archie Patten PGY-3, [place]

## 2016-08-12 ENCOUNTER — Telehealth: Payer: Self-pay | Admitting: Family Medicine

## 2016-08-12 ENCOUNTER — Encounter: Payer: Self-pay | Admitting: Family Medicine

## 2016-08-12 DIAGNOSIS — R109 Unspecified abdominal pain: Secondary | ICD-10-CM | POA: Insufficient documentation

## 2016-08-12 DIAGNOSIS — R10A1 Flank pain, right side: Secondary | ICD-10-CM | POA: Insufficient documentation

## 2016-08-12 LAB — COMPLETE METABOLIC PANEL WITH GFR
ALBUMIN: 4.2 g/dL (ref 3.6–5.1)
ALT: 17 U/L (ref 6–29)
AST: 22 U/L (ref 10–35)
Alkaline Phosphatase: 77 U/L (ref 33–130)
BILIRUBIN TOTAL: 0.5 mg/dL (ref 0.2–1.2)
BUN: 10 mg/dL (ref 7–25)
CALCIUM: 9.6 mg/dL (ref 8.6–10.4)
CO2: 24 mmol/L (ref 20–31)
CREATININE: 0.88 mg/dL (ref 0.50–0.99)
Chloride: 104 mmol/L (ref 98–110)
GFR, Est African American: 80 mL/min (ref >=60)
GFR, Est Non African American: 69 mL/min (ref >=60)
GLUCOSE: 107 mg/dL — AB (ref 65–99)
Potassium: 4.7 mmol/L (ref 3.5–5.3)
Sodium: 138 mmol/L (ref 135–146)
TOTAL PROTEIN: 7.2 g/dL (ref 6.1–8.1)

## 2016-08-12 NOTE — Assessment & Plan Note (Signed)
The patient is presenting with R flank pain consistent with other times when her "kidney has been swollen"  She has a h/o UPJ obstruction and sees urology. U/A unremarkable, less likely stone or pyelonephritis.  - will get BMET to assess for AKI in case there was some sort of obstruction.  - discussed obtaining renal u/s to assess for hydronephrosis. Patient would like this done by her urologist. I agreed continuity is important- if she cannot get in with them within the next day or 2, discussed calling our office so I can set up an U/S - strict return precautions such as urinary retention, inability to urinate, worsening pain, fevers, chills, N/V, hematuria, dysuria. Pt voiced understanding.  - advised to f/u with PCP in 2-4 weeks

## 2016-08-12 NOTE — Telephone Encounter (Signed)
A letter has been sent to the patient.   I called to let her know all the tests came back normal. No evidence of AKI or abnormal LFTs noted. She is still uncomfortable. I stressed that she may have some mild hydronephrosis causing pain that hasn't been picked up on labwork, which is why I wanted to get a renal u/s vs having her touch base with urology (at that time she wanted to call her urologist). The patient still prefers to talk with her urologist, she states she'll call them now about an appt.  Of note, if there is no etiology found, she may benefit from a pelvic exam. This was not done at last visit as she denied any issues with vaginal discharge, dysuria, etc.   Archie Patten, MD [facility], [place] [facility] Resident  08/12/2016, 3:57 PM

## 2016-08-12 NOTE — Telephone Encounter (Signed)
Forwarding to Dr. Lorenso Courier, who saw her yesterday [place] - let me know if I can help!  [place]

## 2016-08-12 NOTE — Telephone Encounter (Signed)
Pt would like to discuss lab results. Please advise. Thanks! ep

## 2016-08-13 DIAGNOSIS — N1330 Unspecified hydronephrosis: Secondary | ICD-10-CM | POA: Diagnosis not present

## 2016-08-13 DIAGNOSIS — R1031 Right lower quadrant pain: Secondary | ICD-10-CM | POA: Diagnosis not present

## 2016-08-31 ENCOUNTER — Other Ambulatory Visit: Payer: Self-pay | Admitting: Family Medicine

## 2016-08-31 DIAGNOSIS — Z1231 Encounter for screening mammogram for malignant neoplasm of breast: Secondary | ICD-10-CM

## 2016-09-02 ENCOUNTER — Ambulatory Visit (INDEPENDENT_AMBULATORY_CARE_PROVIDER_SITE_OTHER): Payer: PPO | Admitting: Family Medicine

## 2016-09-02 ENCOUNTER — Encounter: Payer: Self-pay | Admitting: Family Medicine

## 2016-09-02 VITALS — BP 138/69 | HR 64 | Temp 97.9°F | Ht 61.0 in | Wt 137.0 lb

## 2016-09-02 DIAGNOSIS — M62838 Other muscle spasm: Secondary | ICD-10-CM | POA: Diagnosis not present

## 2016-09-02 MED ORDER — DICLOFENAC SODIUM 75 MG PO TBEC
75.0000 mg | DELAYED_RELEASE_TABLET | Freq: Two times a day (BID) | ORAL | 0 refills | Status: DC
Start: 2016-09-02 — End: 2017-07-29

## 2016-09-02 MED ORDER — BACLOFEN 10 MG PO TABS: 10.0000 mg | ORAL_TABLET | Freq: Two times a day (BID) | ORAL | 0 refills | Status: DC | PRN

## 2016-09-02 NOTE — Patient Instructions (Signed)

## 2016-09-02 NOTE — Progress Notes (Signed)
   Subjective: CC: shoulder pain [id number] Diana Roach is a 65 y.o. female presenting to clinic today for same day appointment. PCP: Chrisandra Netters, MD Concerns today include:  Shoulder pain Patient reports her left shoulder started on Tues with what felt like a crick in her neck.  She notes a knot on her left shoulder.  She reports that a similar episode happened several years ago.  She has take a Goody powder with minimal improvement.  She denies history of shoulder or neck injury.  She notes that she normally works with her hands over her head.  She was evaluated for a torn rotator cuff during her last episode. This work up was negative.  Has not taken any NSAIDs.  She notes that warmth from shower helps.  No topical meds.  Endorses chronic numbness and tingling in hands.  She denies weakness in her UE.  Additionally, patient notes that she was recommended to have surgery in her wrists for carpal tunnel.  She never pursued this.  Social History Reviewed: non smoker. FamHx and MedHx reviewed.  Please see EMR.  ROS: Per HPI  Objective: Office vital signs reviewed. BP 138/69   Pulse 64   Temp 97.9 F (36.6 C) (Oral)   Ht 5\' 1"  (1.549 m)   Wt 137 lb (62.1 kg)   BMI 25.89 kg/m   Physical Examination:  General: Awake, alert, we;; nourished, No acute distress Extremities: warm, well perfused, No edema, cyanosis or clubbing; +2 pulses bilaterally MSK: Normal gait and station, slight prominence of the left supraclavicular fat pad compared to right. Decreased AROM in flexion and sidebending.  Full AROM in rotation and extension of c-spine.  + increased tonicity of the trapezius on left and right (L>R). + TTP to trap. No midline cervical TTP. 5/5 UE strength. Negative empty can. Negative crossover test. Skin: dry, intact, no rashes or lesions Neuro: chronic tingling in hands, though patient has sensation in tact  Assessment/ Plan: 65 y.o. female   1. Muscle spasm of left shoulder.  I  suspect that patient has degenerative changes in the neck, given her chronic numbness and tingling in bilateral hands.  Her exam significant for increased trapezius tonicity. - baclofen (LIORESAL) 10 MG tablet; Take 1 tablet (10 mg total) by mouth 2 (two) times daily as needed for muscle spasms.  Dispense: 30 each; Refill: 0 - diclofenac (VOLTAREN) 75 MG EC tablet; Take 1 tablet (75 mg total) by mouth 2 (two) times daily.  Dispense: 30 tablet; Refill: 0 - Defer imaging to ortho visit - Ambulatory referral to Orthopedic Surgery - Return precautions reviewed  Follow up as needed   Diana Norlander, DO PGY-3, [facility]

## 2016-09-20 ENCOUNTER — Ambulatory Visit (INDEPENDENT_AMBULATORY_CARE_PROVIDER_SITE_OTHER): Payer: Self-pay | Admitting: Orthopaedic Surgery

## 2016-10-08 ENCOUNTER — Ambulatory Visit
Admission: RE | Admit: 2016-10-08 | Discharge: 2016-10-08 | Disposition: A | Discharge status: Home / Self Care | Payer: PPO | Source: Ambulatory Visit | Attending: Family Medicine | Admitting: Family Medicine

## 2016-10-08 DIAGNOSIS — Z1231 Encounter for screening mammogram for malignant neoplasm of breast: Secondary | ICD-10-CM | POA: Diagnosis not present

## 2016-11-15 DIAGNOSIS — H524 Presbyopia: Secondary | ICD-10-CM | POA: Diagnosis not present

## 2016-11-15 DIAGNOSIS — H52223 Regular astigmatism, bilateral: Secondary | ICD-10-CM | POA: Diagnosis not present

## 2016-11-15 DIAGNOSIS — H5203 Hypermetropia, bilateral: Secondary | ICD-10-CM | POA: Diagnosis not present

## 2016-11-15 LAB — HM DIABETES EYE EXAM

## 2016-12-27 ENCOUNTER — Ambulatory Visit (INDEPENDENT_AMBULATORY_CARE_PROVIDER_SITE_OTHER): Payer: PPO | Admitting: Family Medicine

## 2016-12-27 VITALS — BP 130/85 | HR 74 | Temp 97.9°F | Wt 138.2 lb

## 2016-12-27 DIAGNOSIS — J4541 Moderate persistent asthma with (acute) exacerbation: Secondary | ICD-10-CM | POA: Diagnosis not present

## 2016-12-27 DIAGNOSIS — B9789 Other viral agents as the cause of diseases classified elsewhere: Secondary | ICD-10-CM | POA: Diagnosis not present

## 2016-12-27 DIAGNOSIS — J069 Acute upper respiratory infection, unspecified: Secondary | ICD-10-CM

## 2016-12-27 DIAGNOSIS — J45901 Unspecified asthma with (acute) exacerbation: Secondary | ICD-10-CM | POA: Insufficient documentation

## 2016-12-27 MED ORDER — FLUTICASONE PROPIONATE HFA 110 MCG/ACT IN AERO: 1.0000 | INHALATION_SPRAY | Freq: Two times a day (BID) | RESPIRATORY_TRACT | 12 refills | Status: DC

## 2016-12-27 MED ORDER — PREDNISONE 50 MG PO TABS: 50.0000 mg | ORAL_TABLET | Freq: Every day | ORAL | 0 refills | Status: AC

## 2016-12-27 MED ORDER — ALBUTEROL SULFATE (2.5 MG/3ML) 0.083% IN NEBU
2.5000 mg | INHALATION_SOLUTION | Freq: Once | RESPIRATORY_TRACT | Status: AC
  Administered 2016-12-27: 2.5 mg via RESPIRATORY_TRACT

## 2016-12-27 MED ORDER — ALBUTEROL SULFATE HFA 108 (90 BASE) MCG/ACT IN AERS: 2.0000 | INHALATION_SPRAY | RESPIRATORY_TRACT | 2 refills | Status: DC | PRN

## 2016-12-27 NOTE — Assessment & Plan Note (Signed)
Evidence of asthma exacerbation on exam today with diffuse expiratory wheeze and shortness of breath Likely precipitated 5 viral URI Discussed with patient risks and benefits of Tamiflu given that this could be flulike illness, with patients in her decision-making, decided against Tamiflu Treat asthma exacerbation with albuterol when necessary, prednisone 50 mg daily 5 days Also refilled Flovent and patient will resume her controller medication Return precautions discussed

## 2016-12-27 NOTE — Progress Notes (Signed)
   Subjective:   Diana Roach is a 66 y.o. female with a history of Moderate persistent asthma, HLD, stress urinary incontinence here for same day appointment for cold symptoms  Patient reports cough productive of white or green sputum, nasal congestion, chills, generalized fatigue 5 days. She reports many sick contacts at home and the church. She has also had intermittent shortness of breath. She denies any fevers, abdominal pain, nausea, vomiting, myalgias, chest pain. She has not been using her albuterol inhaler and thinks that it may be expired. She is also running out of her Flovent that she typically takes regularly PID. She says this feels similar to previous asthma exacerbations.  Review of Systems:  Per HPI.   Social History: Former smoker  Objective:  BP 130/85   Pulse 74   Temp 97.9 F (36.6 C)   Wt 138 lb 3.2 oz (62.7 kg)   SpO2 95%   BMI 26.11 kg/m   Gen:  66 y.o. female in NAD HEENT: NCAT, MMM, EOMI, PERRL, anicteric sclerae, OP mildly erythematous without tonsillar exudate CV: RRR, no MRG Resp: Non-labored, diffuse expiratory wheeze with decreased air movement in bases Abd: Soft, NTND, BS present, no guarding or organomegaly Ext: WWP, no edema MSK: No obvious deformities, gait intact Neuro: Alert and oriented, speech normal      Assessment & Plan:     Diana Roach is a 66 y.o. female here for  Asthma with acute exacerbation Evidence of asthma exacerbation on exam today with diffuse expiratory wheeze and shortness of breath Likely precipitated 5 viral URI Discussed with patient risks and benefits of Tamiflu given that this could be flulike illness, with patients in her decision-making, decided against Tamiflu Treat asthma exacerbation with albuterol when necessary, prednisone 50 mg daily 5 days Also refilled Flovent and patient will resume her controller medication Return precautions discussed   Virginia Crews, MD MPH PGY-3,  [facility] 12/27/2016  2:43 PM

## 2016-12-27 NOTE — Patient Instructions (Signed)
Nice to see you today. You have a viral upper respiratory infection that has caused an asthma exacerbation. We give you nebulizer in clinic that seemed to help with your breathing some. I refilled her Flovent and he should resume taking this twice a day. I refilled her albuterol and you should use this as needed for wheezing, shortness of breath. I'm also prescribing prednisone 50 mg daily he should take for the next 5 days to help with your breathing. If your breathing worsens, please return to the office.  You can use cough medicines, nasal saline, a humidifier to treat your symptoms  Take care, Dr. Jacinto Reap

## 2017-02-14 ENCOUNTER — Encounter: Payer: Self-pay | Admitting: Family Medicine

## 2017-02-14 ENCOUNTER — Ambulatory Visit (INDEPENDENT_AMBULATORY_CARE_PROVIDER_SITE_OTHER): Payer: PPO | Admitting: Family Medicine

## 2017-02-14 VITALS — BP 130/80 | HR 57 | Temp 98.5°F | Wt 135.0 lb

## 2017-02-14 DIAGNOSIS — R109 Unspecified abdominal pain: Secondary | ICD-10-CM | POA: Diagnosis not present

## 2017-02-14 DIAGNOSIS — Z79899 Other long term (current) drug therapy: Secondary | ICD-10-CM | POA: Diagnosis not present

## 2017-02-14 LAB — POCT URINALYSIS DIP (MANUAL ENTRY)
BILIRUBIN UA: NEGATIVE mg/dL
Bilirubin, UA: NEGATIVE
Blood, UA: NEGATIVE
Glucose, UA: NEGATIVE mg/dL
LEUKOCYTES UA: NEGATIVE
Nitrite, UA: NEGATIVE
PROTEIN UA: NEGATIVE mg/dL
Spec Grav, UA: 1.02 (ref 1.010–1.025)
UROBILINOGEN UA: 0.2 U/dL
pH, UA: 7 (ref 5.0–8.0)

## 2017-02-14 NOTE — Patient Instructions (Signed)
It was a pleasure seeing you today in our clinic. Today we discussed your abdominal and flank pain. Here is the treatment plan we have discussed and agreed upon together:   - Continue taking your MiraLAX. I would also recommend trying to start a light regimen of over-the-counter Metamucil. - We are obtaining labs today. I will mail you these results. - I would like to have a follow-up with your primary care provider, [name]

## 2017-02-14 NOTE — Progress Notes (Signed)
   HPI  CC: R flank pain Patient is here with complaints of right-sided flank pain. She states that this discomfort started as past Friday, 3 days ago. She states that since it began it has been hard to sleep on the affected side due to some discomfort. She states that she has had issues with constipation and diverticulosis in the past and wonders if this could be the case. She describes the pain as sharp and intermittent. Located along the RUQ and right flank with occasional extension up to the right scapula. She endorses some cloudiness to her urine recently with very mild dysuria. The symptoms have since resolved. She denies any hematochezia or melena. No hematuria. No fevers, chills, nausea, vomiting, or diarrhea.  Review of Systems See HPI for ROS.   CC, SH/smoking status, and VS noted  Objective: BP 130/80   Pulse (!) 57   Temp 98.5 F (36.9 C) (Oral)   Wt 135 lb (61.2 kg)   BMI 25.51 kg/m  Gen: NAD, alert, cooperative, and pleasant. CV: RRR, no murmur Resp: CTAB, no wheezes, non-labored Abd: S, slight TTP along the RUQ, ND, BS present, no significant CVA tenderness, Murphy's negative, no guarding or organomegaly Ext: No edema, warm Neuro: Alert and oriented, Speech clear, No gross deficits   Assessment and plan:  Right flank pain Patient is here with complaints of right-sided flank and abdominal pain. Etiology currently unknown at this time. Differential includes UTI, however UA was clear. Patient has a long history of constipation, however would suspect left-sided abdominal pain. Findings seem to be most consistent with biliary colic (RUQ, radiation to scapula), however Murphy's was negative. - CMP obtained today. - Encouraged MiraLAX use and over-the-counter Metamucil. - Reassurance for now, strict return precautions if symptoms worsen. If symptoms persist patient is to follow-up as well.  Next: Strong consideration for RUQ abdominal ultrasound if symptoms persist or  worsen, to rule out gallbladder disease.   Orders Placed This Encounter  Procedures  . Comprehensive metabolic panel    Order Specific Question:   Has the patient fasted?    Answer:   No  . POCT urinalysis dipstick    Elberta Leatherwood, MD,MS,  PGY3 02/14/2017 5:51 PM

## 2017-02-14 NOTE — Assessment & Plan Note (Signed)
Patient is here with complaints of right-sided flank and abdominal pain. Etiology currently unknown at this time. Differential includes UTI, however UA was clear. Patient has a long history of constipation, however would suspect left-sided abdominal pain. Findings seem to be most consistent with biliary colic (RUQ, radiation to scapula), however Murphy's was negative. - CMP obtained today. - Encouraged MiraLAX use and over-the-counter Metamucil. - Reassurance for now, strict return precautions if symptoms worsen. If symptoms persist patient is to follow-up as well.  Next: Strong consideration for RUQ abdominal ultrasound if symptoms persist or worsen, to rule out gallbladder disease.

## 2017-02-15 LAB — COMPREHENSIVE METABOLIC PANEL
A/G RATIO: 1.5 (ref 1.2–2.2)
ALBUMIN: 4.4 g/dL (ref 3.6–4.8)
ALT: 13 IU/L (ref 0–32)
AST: 16 IU/L (ref 0–40)
Alkaline Phosphatase: 98 IU/L (ref 39–117)
BUN/Creatinine Ratio: 12 (ref 12–28)
BUN: 11 mg/dL (ref 8–27)
Bilirubin Total: 0.3 mg/dL (ref 0.0–1.2)
CALCIUM: 9.9 mg/dL (ref 8.7–10.3)
CO2: 22 mmol/L (ref 18–29)
CREATININE: 0.89 mg/dL (ref 0.57–1.00)
Chloride: 101 mmol/L (ref 96–106)
GFR calc Af Amer: 79 mL/min/{1.73_m2} (ref >59)
GFR, EST NON AFRICAN AMERICAN: 68 mL/min/{1.73_m2} (ref >59)
GLOBULIN, TOTAL: 3 g/dL (ref 1.5–4.5)
Glucose: 101 mg/dL — ABNORMAL HIGH (ref 65–99)
POTASSIUM: 4.3 mmol/L (ref 3.5–5.2)
SODIUM: 139 mmol/L (ref 134–144)
TOTAL PROTEIN: 7.4 g/dL (ref 6.0–8.5)

## 2017-02-17 ENCOUNTER — Encounter: Payer: Self-pay | Admitting: Family Medicine

## 2017-04-17 IMAGING — CT CT ABD-PELV W/O CM
2 of 4 series · 16 of 46 positions shown, 18 images · non-contrast
Comparison: 10/09/2014 and prior CTs.

CLINICAL DATA: 64-year-old female with left abdominal and pelvic
pain for several days.

EXAM:
CT ABDOMEN AND PELVIS WITHOUT CONTRAST
TECHNIQUE: Multidetector CT imaging of the abdomen and pelvis was performed
following the standard protocol without IV contrast.

[Series 2: abd/ pelvis 5.0 i30f 1 · axial · 0.70mm/px · z∈[-476,-62]mm · 13 of 91 slices shown, 15 images]
[im 4/91  soft-tissue]
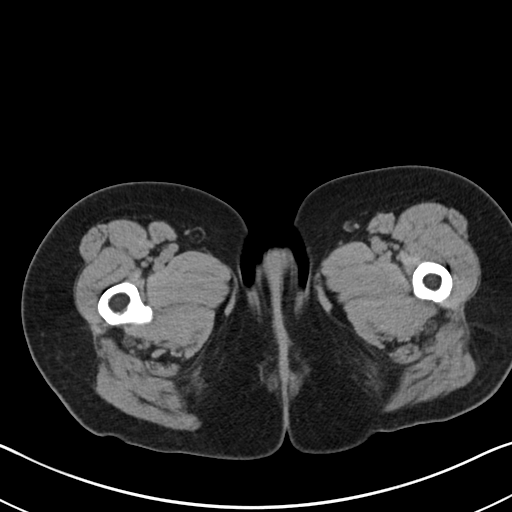
[im 4/91  bone]
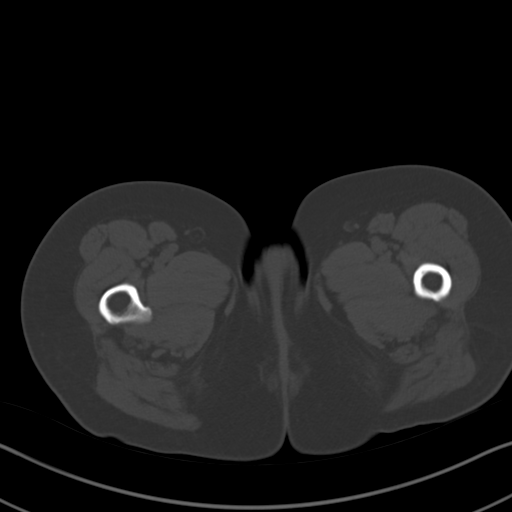
[im 12/91  soft-tissue]
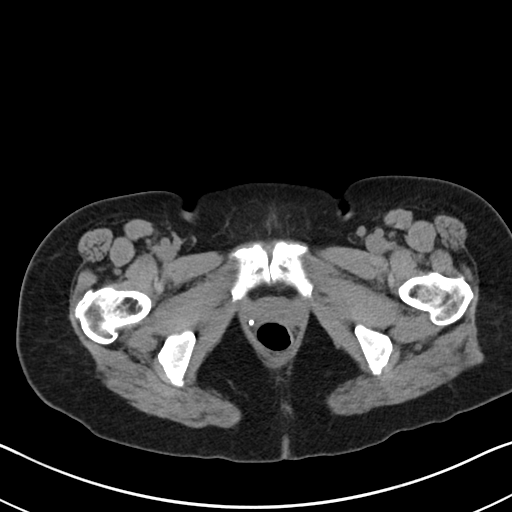
[im 19/91  soft-tissue]
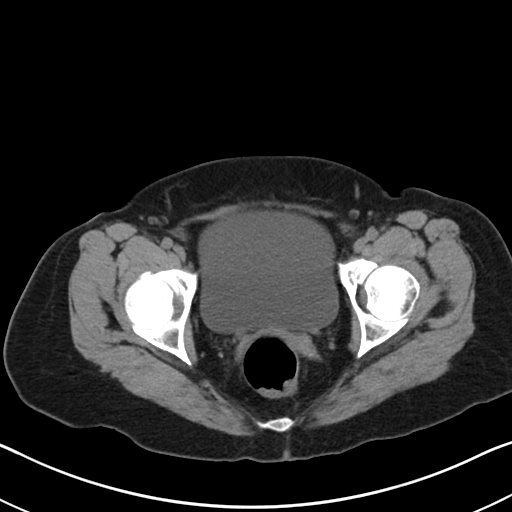
[im 27/91  soft-tissue]
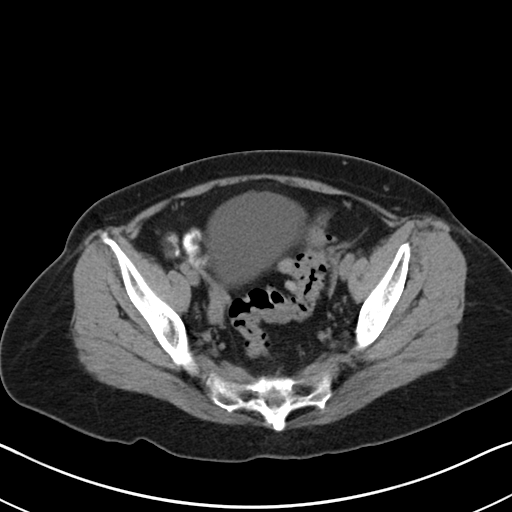
[im 31/91  soft-tissue]
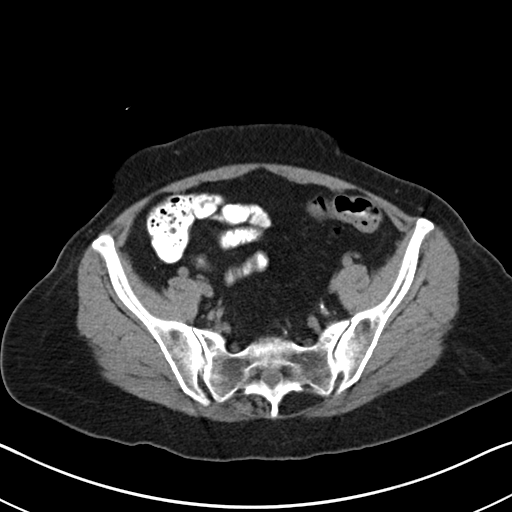
[im 38/91  soft-tissue]
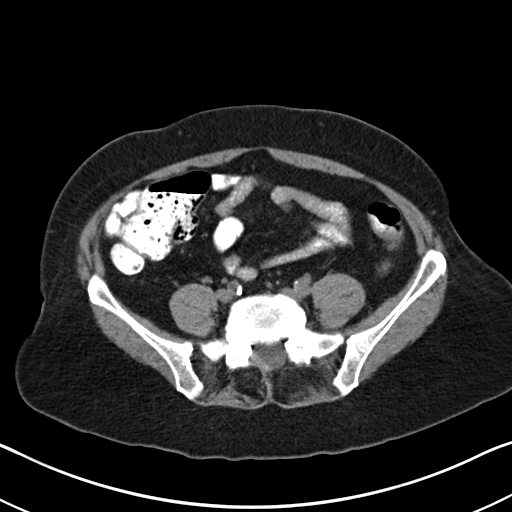
[im 46/91  soft-tissue]
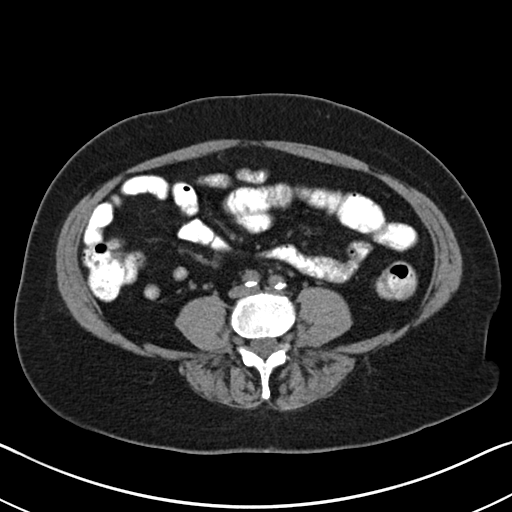
[im 53/91  soft-tissue]
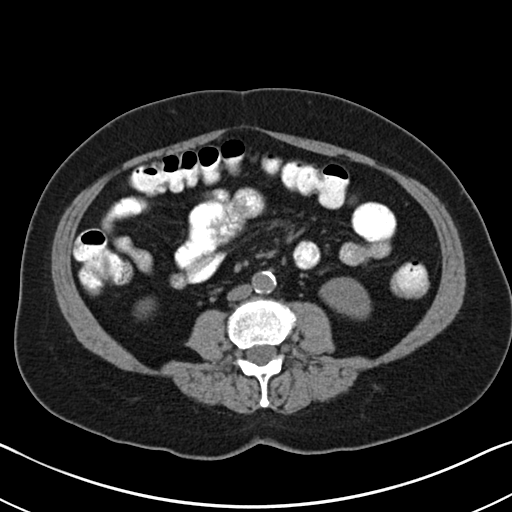
[im 61/91  soft-tissue]
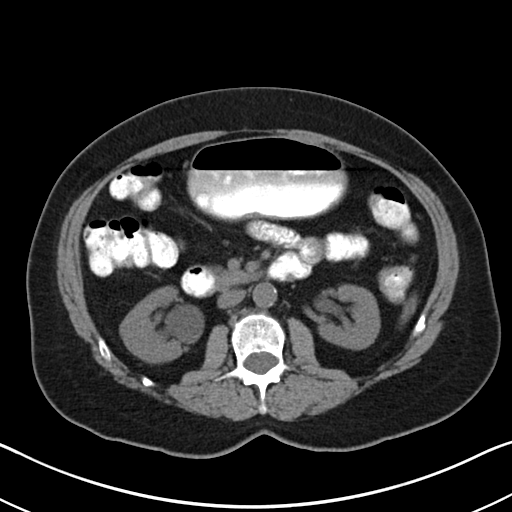
[im 61/91  bone]
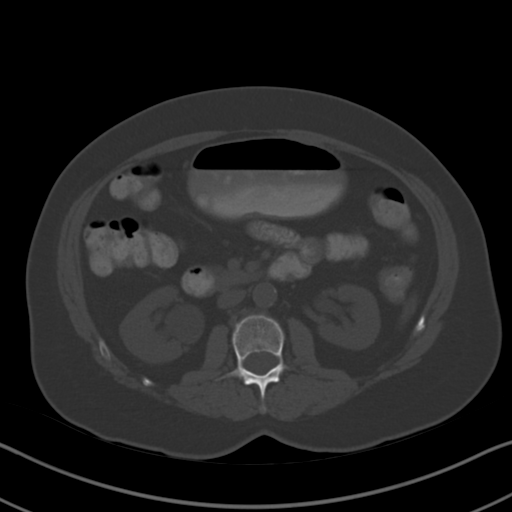
[im 64/91  soft-tissue]
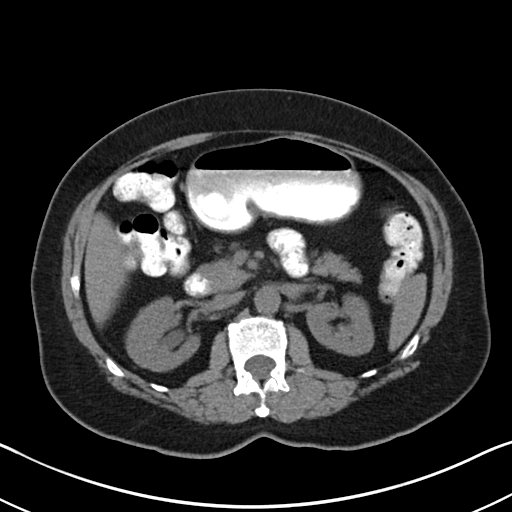
[im 72/91  soft-tissue]
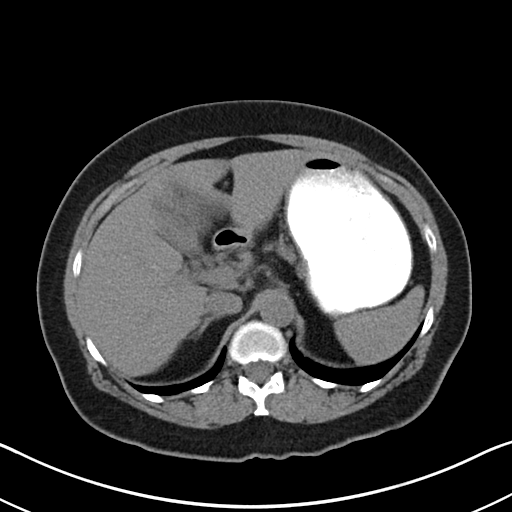
[im 79/91  soft-tissue]
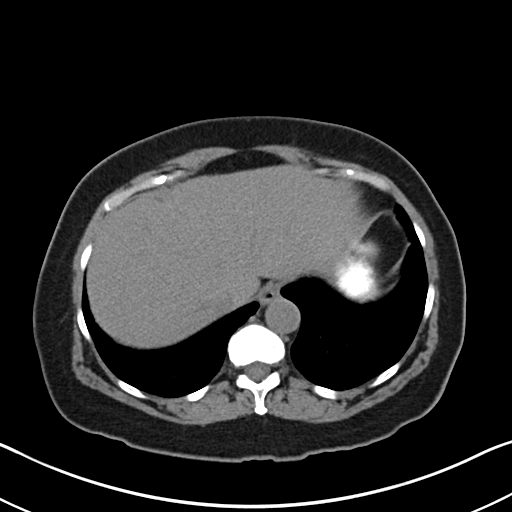
[im 87/91  soft-tissue]
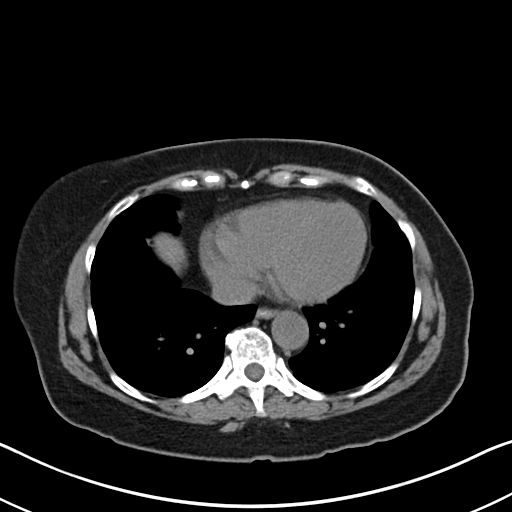

[Series 5: cor st · coronal · 0.75mm/px · 3 of 87 slices shown]
[im 29/87  soft-tissue]
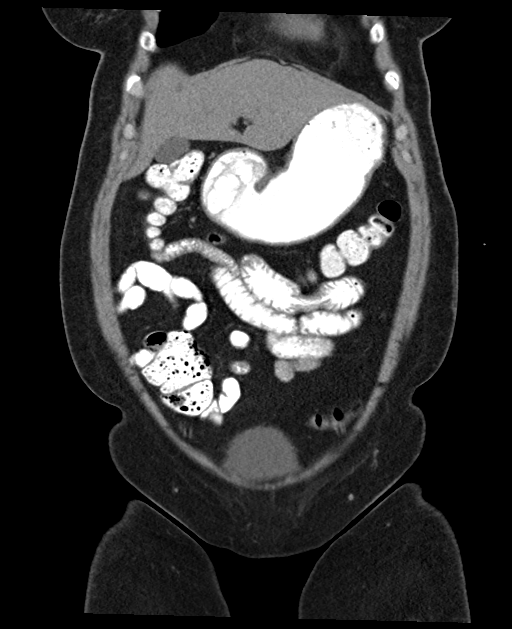
[im 39/87  soft-tissue]
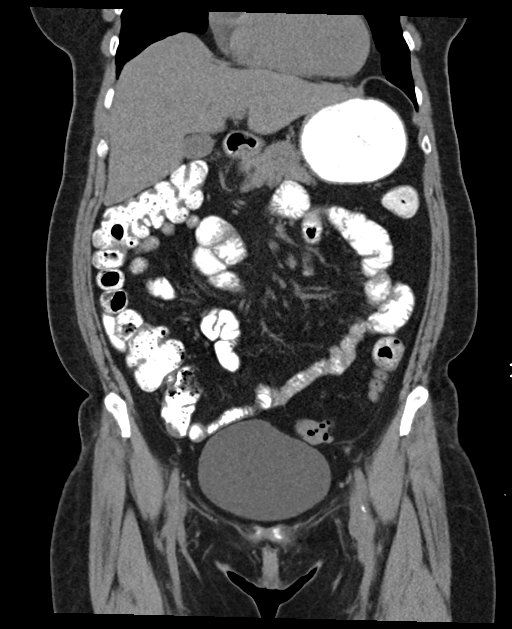
[im 48/87  soft-tissue]
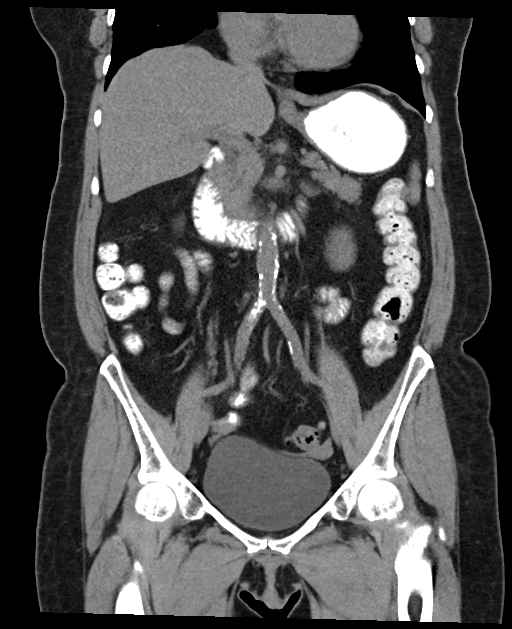

[16 of 46 positions shown; findings below may reference images not displayed]

FINDINGS: Please note that parenchymal abnormalities may be missed without
intravenous contrast.

Lower chest:  No acute abnormalities.

Hepatobiliary: The liver and gallbladder are unremarkable. There is
no evidence biliary dilatation.

Pancreas: Unremarkable

Spleen: Unremarkable

Adrenals/Urinary Tract: The kidneys, adrenal glands and bladder are
unremarkable. There is no evidence of hydronephrosis urinary
calculi.

Stomach/Bowel: Colonic diverticulosis noted without evidence of
diverticulitis. There is no evidence of bowel obstruction or
definite bowel wall thickening.

Vascular/Lymphatic: Aortic atherosclerotic calcifications noted
without aneurysm. No enlarged lymph nodes identified.

Reproductive: Unremarkable

Other: No free fluid, focal collection or pneumoperitoneum.

Musculoskeletal: No acute or suspicious abnormality. Mild
degenerative disc disease at L5-S1 and L3-L4 noted.
IMPRESSION: No evidence of acute abnormality.

Colonic diverticulosis and aortic atherosclerosis.

## 2017-05-05 DIAGNOSIS — N393 Stress incontinence (female) (male): Secondary | ICD-10-CM | POA: Diagnosis not present

## 2017-05-05 DIAGNOSIS — Q6211 Congenital occlusion of ureteropelvic junction: Secondary | ICD-10-CM | POA: Diagnosis not present

## 2017-06-16 ENCOUNTER — Encounter: Payer: Self-pay | Admitting: Internal Medicine

## 2017-06-16 ENCOUNTER — Ambulatory Visit (INDEPENDENT_AMBULATORY_CARE_PROVIDER_SITE_OTHER): Payer: PPO | Admitting: Internal Medicine

## 2017-06-16 VITALS — BP 156/80 | HR 69 | Temp 98.6°F | Ht 61.0 in | Wt 135.8 lb

## 2017-06-16 DIAGNOSIS — L439 Lichen planus, unspecified: Secondary | ICD-10-CM | POA: Diagnosis not present

## 2017-06-16 DIAGNOSIS — R739 Hyperglycemia, unspecified: Secondary | ICD-10-CM | POA: Diagnosis not present

## 2017-06-16 DIAGNOSIS — Z23 Encounter for immunization: Secondary | ICD-10-CM | POA: Diagnosis not present

## 2017-06-16 DIAGNOSIS — R200 Anesthesia of skin: Secondary | ICD-10-CM | POA: Diagnosis not present

## 2017-06-16 DIAGNOSIS — Z0000 Encounter for general adult medical examination without abnormal findings: Secondary | ICD-10-CM

## 2017-06-16 DIAGNOSIS — Z780 Asymptomatic menopausal state: Secondary | ICD-10-CM | POA: Diagnosis not present

## 2017-06-16 DIAGNOSIS — J4540 Moderate persistent asthma, uncomplicated: Secondary | ICD-10-CM | POA: Diagnosis not present

## 2017-06-16 LAB — POCT GLYCOSYLATED HEMOGLOBIN (HGB A1C): Hemoglobin A1C: 6.5

## 2017-06-16 MED ORDER — BETAMETHASONE DIPROPIONATE 0.05 % EX LOTN: TOPICAL_LOTION | Freq: Two times a day (BID) | CUTANEOUS | 1 refills | Status: DC

## 2017-06-16 MED ORDER — AMITRIPTYLINE HCL 25 MG PO TABS: 25.0000 mg | ORAL_TABLET | Freq: Every day | ORAL | 2 refills | Status: DC

## 2017-06-16 MED ORDER — MONTELUKAST SODIUM 10 MG PO TABS: 10.0000 mg | ORAL_TABLET | Freq: Every day | ORAL | 4 refills | Status: DC

## 2017-06-16 NOTE — Progress Notes (Signed)
[facility] Progress Note  Subjective:  Diana Roach is a 66 y.o. female with history of asthma, back pain, lichen planus, and diverticulitis who presents for a check-up.   Concerns today include:  - Says she thinks her diverticulits is acting up. Has BMs every several days if does not take miralax. Says she eats a lot of vegetables and trying to drink more water.  - Gets dizzy with some blackening of vision sometimes if standing up quickly. - Thinks her lichen planus (previously diagnosed by biopsy) is having a flare. Sometimes will use OTC hydrocortisone cream.  - Has been told she has catarracts and follows with Ophthalmology. - Takes flovent and albuterol but still feels SOB at times with coughing.  - Gets ulcers on her gums she attributes to dentures. - Increased urinary frequency with history of mom, aunts and p. grandfather with diabetes.  ROS: Positive for moles, sores that won't heal, trouble seeing, trouble hearing, sores in mouth, trouble breathing, cough, constipation, abdominal pain, frequent urination, muscle cramps, joint pain, numbness, dizziness  PHQ-2: 0  Social: Does not regularly exercise; former smoker, does not drink EtOH, does not use illegal drugs, feels safe in relationship  Past Medical History:  Diagnosis Date  . Asthma   . Diverticulitis   . Environmental allergies   . Lichen planus     Social History   Social History  . Marital status: Widowed    Spouse name: N/A  . Number of children: 1  . Years of education: N/A   Occupational History  . [employer]   Social History Main Topics  . Smoking status: Former Smoker    Quit date: 10/25/1996  . Smokeless tobacco: Never Used  . Alcohol use No  . Drug use: No  . Sexual activity: Not on file   Other Topics Concern  . Not on file   Social History Narrative  . No narrative on file    Allergies  Allergen Reactions  . Minocycline Other (See Comments)   Dizziness , "strange feeling"  . Codeine Itching  . Contrast Media [Iodinated Diagnostic Agents]     BP dropped  . Levaquin [Levofloxacin] Other (See Comments)    hallucinations    Objective: Blood pressure (!) 156/80, pulse 69, temperature 98.6 F (37 C), temperature source Oral, height 5\' 1"  (1.549 m), weight 135 lb 12.8 oz (61.6 kg), SpO2 98 %. Body mass index is 25.66 kg/m. Constitutional: Well-appearing female in NAD HENT: MMM Cardiovascular: RRR, S1, S2, no m/r/g.  Pulmonary/Chest: Effort normal and breath sounds normal. No respiratory distress.  Abdominal: Soft. +BS, NT, ND Musculoskeletal: No LE edema. Mild TTP over lumbar midline spine.  Neurological: AOx3. Reports decreased sensation to cold, soft, and prickly across fingertips bilaterally; intact over plantar surface of feet. Negative Tinel and phalen's sign.  Skin: Pink patches across bilateral shins without scaling.  Psychiatric: Anxious affect.  Vitals reviewed  Assessment/Plan: Numbness - Chronic for several years. Was told previously this was carpal tunnel and had been evaluated by a hand surgeon but surgery too expensive. Also reports numbness of feet. Differential includes peripheral neuropathy 2/2 hyperglycemia or vitamin deficiency or cervical radiculopathy but no TTP of midline cervical spine.  - Will check B12 and BMP. Normal TSH in 2014. - Will obtain hgb A1c.  - Will refer to Neurology for possible nerve conduction studies. - To try amitriptyline 25 mg daily. Felt "worse" on gabapentin when tried on past.   Lichen planus -  Biopsy confirmed in 2014. Not using high potency steroid. - Prescribed betamethasone for patient to use BID for flares. Counseled not to use more than 1-2 weeks at a time to avoid skin atrophy.   Asthma, moderate persistent - Prescribed singulair 10 mg to add to patient's regimen of albuterol prn and flovent.   Recommended daily use of miralax to have more regular BMs.  Ordered  PCV-13 to patient's pharmacy.   Patient offered but declined AWV.   Follow-up in about 4-6 weeks to re-assess nerve pain.   Olene Floss, MD [place], PGY-3

## 2017-06-16 NOTE — Patient Instructions (Signed)
Ms. Langenberg,  I recommend taking singulair at night to help with asthma.  For nerve pain, I will refer you to Neurology. Try wrist splints at night. Take amitriptyline 25 mg daily.  For lichen planus, try betamethasone twice daily for up to 2 weeks.  I will call you with lab results.  Please follow-up in about 4-6 weeks to see if amitriptyline is helping.   Best, Dr. Ola Spurr

## 2017-06-17 DIAGNOSIS — R200 Anesthesia of skin: Secondary | ICD-10-CM | POA: Insufficient documentation

## 2017-06-17 LAB — BASIC METABOLIC PANEL
BUN / CREAT RATIO: 15 (ref 12–28)
BUN: 13 mg/dL (ref 8–27)
CALCIUM: 9.6 mg/dL (ref 8.7–10.3)
CHLORIDE: 104 mmol/L (ref 96–106)
CO2: 23 mmol/L (ref 20–29)
Creatinine, Ser: 0.87 mg/dL (ref 0.57–1.00)
GFR calc non Af Amer: 70 mL/min/{1.73_m2} (ref >59)
GFR, EST AFRICAN AMERICAN: 80 mL/min/{1.73_m2} (ref >59)
Glucose: 96 mg/dL (ref 65–99)
POTASSIUM: 4.8 mmol/L (ref 3.5–5.2)
SODIUM: 140 mmol/L (ref 134–144)

## 2017-06-17 LAB — VITAMIN B12: VITAMIN B 12: 327 pg/mL (ref 232–1245)

## 2017-06-17 NOTE — Assessment & Plan Note (Signed)
-   Chronic for several years. Was told previously this was carpal tunnel and had been evaluated by a hand surgeon but surgery too expensive. Also reports numbness of feet. Differential includes peripheral neuropathy 2/2 hyperglycemia or vitamin deficiency or cervical radiculopathy but no TTP of midline cervical spine.  - Will check B12 and BMP. Normal TSH in 2014. - Will obtain hgb A1c.  - Will refer to Neurology for possible nerve conduction studies. - To try amitriptyline 25 mg daily. Felt "worse" on gabapentin when tried on past.

## 2017-06-17 NOTE — Assessment & Plan Note (Signed)
-   Prescribed singulair 10 mg to add to patient's regimen of albuterol prn and flovent.

## 2017-06-17 NOTE — Assessment & Plan Note (Signed)
-   Biopsy confirmed in 2014. Not using high potency steroid. - Prescribed betamethasone for patient to use BID for flares. Counseled not to use more than 1-2 weeks at a time to avoid skin atrophy.

## 2017-06-20 ENCOUNTER — Other Ambulatory Visit: Payer: Self-pay | Admitting: Internal Medicine

## 2017-06-20 DIAGNOSIS — E2839 Other primary ovarian failure: Secondary | ICD-10-CM

## 2017-06-29 ENCOUNTER — Ambulatory Visit
Admission: RE | Admit: 2017-06-29 | Discharge: 2017-06-29 | Disposition: A | Discharge status: Home / Self Care | Payer: PPO | Source: Ambulatory Visit | Attending: Family Medicine | Admitting: Family Medicine

## 2017-06-29 DIAGNOSIS — E2839 Other primary ovarian failure: Secondary | ICD-10-CM

## 2017-06-29 DIAGNOSIS — M8589 Other specified disorders of bone density and structure, multiple sites: Secondary | ICD-10-CM | POA: Diagnosis not present

## 2017-06-29 DIAGNOSIS — Z780 Asymptomatic menopausal state: Secondary | ICD-10-CM | POA: Diagnosis not present

## 2017-07-29 ENCOUNTER — Ambulatory Visit (INDEPENDENT_AMBULATORY_CARE_PROVIDER_SITE_OTHER): Payer: PPO | Admitting: Family Medicine

## 2017-07-29 ENCOUNTER — Encounter: Payer: Self-pay | Admitting: Family Medicine

## 2017-07-29 VITALS — BP 132/78 | HR 64 | Temp 97.9°F | Ht 61.0 in | Wt 136.0 lb

## 2017-07-29 DIAGNOSIS — L439 Lichen planus, unspecified: Secondary | ICD-10-CM

## 2017-07-29 DIAGNOSIS — Z23 Encounter for immunization: Secondary | ICD-10-CM

## 2017-07-29 DIAGNOSIS — M722 Plantar fascial fibromatosis: Secondary | ICD-10-CM | POA: Diagnosis not present

## 2017-07-29 DIAGNOSIS — R200 Anesthesia of skin: Secondary | ICD-10-CM | POA: Diagnosis not present

## 2017-07-29 DIAGNOSIS — R7303 Prediabetes: Secondary | ICD-10-CM

## 2017-07-29 LAB — POCT GLYCOSYLATED HEMOGLOBIN (HGB A1C): HEMOGLOBIN A1C: 6.4

## 2017-07-29 NOTE — Progress Notes (Signed)
Date of Visit: 07/29/2017   HPI:  Patient presents to discuss the following:  -Numbness in hands: Has prior history of carpal tunnel syndrome. Saw Dr. Ola Spurr for this about a month ago and was given a prescription for amitriptyline. She did not tolerate this as it made her too sleepy. Having increased numbness in her fingers. Has upcoming appointment in the next couple of weeks with a neurologist for possible nerve conduction study. Has some shooting pain up her left arm that radiates upward from the numbness in her fingers. Also notes some numbness in her toes.  Heel pain: Has pain in her left heel overlying the medial calcaneus for the last 3 or so weeks. Worse in the morning when getting up, and with ambulation.  Lichen planus; Has documented biopsy history of this. Previously saw [facility] on [address] and would like to return there but needs referral. Abdomen visit recently with Dr. Ola Spurr she was given a prescription for betamethasone lotion, a high potency steroid. The spots on her shins initially got a little bit better but now seem to be worsening.  Of note recently she had her A1c checked as part of the workup for numbness and it resulted at 6.5. She does report a history of diabetes in her family. Agreeable to having this rechecked today.  ROS: See HPI.  [place]: history of asthma, hyperlipidemia, lichen planus, congenital UPJ obstruction  PHYSICAL EXAM: BP 132/78   Pulse 64   Temp 97.9 F (36.6 C) (Oral)   Ht 5\' 1"  (1.549 m)   Wt 136 lb (61.7 kg)   SpO2 98%   BMI 25.70 kg/m  Gen: no acute distress, pleasant, cooperative, well appearing HEENT: normocephalic, atraumatic, moist mucous membranes  Neuro: alert, grossly nonfocal, speech normal Ext: grip 5/5 bilaterally. + tinels on L, + phalens bilaterally, no thenar atrophy L foot with tenderness to palpation fo the medial calcaneus. Skin: pink round flat macules present on bilateral shins  R>L  ASSESSMENT/PLAN:  Health maintenance:  -given flu shot today -prevnar 13 given today -otherwise UTD on HM items  Lichen planus Not well controlled despite use of topical betamethasone lotion. I will refer her back to [facility].  Plantar fasciitis Typical findings of medial calcaneal pain on exam. Given handout on rehabilitation exercises, counseled on likely requiring quite a while before it improves.  Numbness Has follow-up in place with neurology. Encouraged her to keep this appointment. No further workup on this today since she has such close follow-up in place.  Prediabetes Discussed with patient today about a 6.5 A1c is technically diagnostic of diabetes. However, given her well-controlled glucoses on prior lab draws I am somewhat suspicious of this result. We will repeat A1c today to confirm that it is truly that high.  FOLLOW UP: Keep appointment with neuro Further follow up pending results of A1c.  [place]. Ardelia Mems, [place]

## 2017-07-29 NOTE — Assessment & Plan Note (Signed)
Typical findings of medial calcaneal pain on exam. Given handout on rehabilitation exercises, counseled on likely requiring quite a while before it improves.

## 2017-07-29 NOTE — Patient Instructions (Signed)
Rechecking A1c Referring to dermatology See handout below on your foot Keep neuro appointment  Will let you know results of A1c  Be well, [name]     Plantar Fasciitis Rehab Ask your health care provider which exercises are safe for you. Do exercises exactly as told by your health care provider and adjust them as directed. It is normal to feel mild stretching, pulling, tightness, or discomfort as you do these exercises, but you should stop right away if you feel sudden pain or your pain gets worse. Do not begin these exercises until told by your health care provider. Stretching and range of motion exercises These exercises warm up your muscles and joints and improve the movement and flexibility of your foot. These exercises also help to relieve pain. Exercise A: Plantar fascia stretch  1. Sit with your left / right leg crossed over your opposite knee. 2. Hold your heel with one hand with that thumb near your arch. With your other hand, hold your toes and gently pull them back toward the top of your foot. You should feel a stretch on the bottom of your toes or your foot or both. 3. Hold this stretch for__________ seconds. 4. Slowly release your toes and return to the starting position. Repeat __________ times. Complete this exercise __________ times a day. Exercise B: Gastroc, standing  1. Stand with your hands against a wall. 2. Extend your left / right leg behind you, and bend your front knee slightly. 3. Keeping your heels on the floor and keeping your back knee straight, shift your weight toward the wall without arching your back. You should feel a gentle stretch in your left / right calf. 4. Hold this position for __________ seconds. Repeat __________ times. Complete this exercise __________ times a day. Exercise C: Soleus, standing 1. Stand with your hands against a wall. 2. Extend your left / right leg behind you, and bend your front knee slightly. 3. Keeping your heels on  the floor, bend your back knee and slightly shift your weight over the back leg. You should feel a gentle stretch deep in your calf. 4. Hold this position for __________ seconds. Repeat __________ times. Complete this exercise __________ times a day. Exercise D: Gastrocsoleus, standing 1. Stand with the ball of your left / right foot on a step. The ball of your foot is on the walking surface, right under your toes. 2. Keep your other foot firmly on the same step. 3. Hold onto the wall or a railing for balance. 4. Slowly lift your other foot, allowing your body weight to press your heel down over the edge of the step. You should feel a stretch in your left / right calf. 5. Hold this position for __________ seconds. 6. Return both feet to the step. 7. Repeat this exercise with a slight bend in your left / right knee. Repeat __________ times with your left / right knee straight and __________ times with your left / right knee bent. Complete this exercise __________ times a day. Balance exercise This exercise builds your balance and strength control of your arch to help take pressure off your plantar fascia. Exercise E: Single leg stand 1. Without shoes, stand near a railing or in a doorway. You may hold onto the railing or door frame as needed. 2. Stand on your left / right foot. Keep your big toe down on the floor and try to keep your arch lifted. Do not let your foot roll inward. 3.  Hold this position for __________ seconds. 4. If this exercise is too easy, you can try it with your eyes closed or while standing on a pillow. Repeat __________ times. Complete this exercise __________ times a day. This information is not intended to replace advice given to you by your health care provider. Make sure you discuss any questions you have with your health care provider. Document Released: 10/11/2005 Document Revised: 06/15/2016 Document Reviewed: 08/25/2015 Elsevier Interactive Patient Education  2018  [employer].

## 2017-07-29 NOTE — Assessment & Plan Note (Signed)
Has follow-up in place with neurology. Encouraged her to keep this appointment. No further workup on this today since she has such close follow-up in place.

## 2017-07-29 NOTE — Assessment & Plan Note (Signed)
Not well controlled despite use of topical betamethasone lotion. I will refer her back to [place] [facility].

## 2017-07-29 NOTE — Assessment & Plan Note (Signed)
Discussed with patient today about a 6.5 A1c is technically diagnostic of diabetes. However, given her well-controlled glucoses on prior lab draws I am somewhat suspicious of this result. We will repeat A1c today to confirm that it is truly that high.

## 2017-08-04 ENCOUNTER — Telehealth: Payer: Self-pay | Admitting: Family Medicine

## 2017-08-04 DIAGNOSIS — L439 Lichen planus, unspecified: Secondary | ICD-10-CM

## 2017-08-04 NOTE — Telephone Encounter (Signed)
Called patient to discuss A1c from last week, which resulted at 6.4 Explained she is just on the cusp of diabetes and we could even technically label her as having diabetes based on that A1c of 6.5 a few months ago.  We will recheck her in 3 months. Discussed that if we do label her as having diabetes there is lots more in terms of health maintenance that she needs. Will keep her presently labeled as prediabetes.  Recheck A1c in 3 months & then decide. Also she asked about derm referral - it appears I did not ever actually put that referral in. Advised I will enter it right now.  Leeanne Rio, MD

## 2017-08-08 ENCOUNTER — Ambulatory Visit (INDEPENDENT_AMBULATORY_CARE_PROVIDER_SITE_OTHER): Payer: PPO | Admitting: Diagnostic Neuroimaging

## 2017-08-08 ENCOUNTER — Encounter: Payer: Self-pay | Admitting: Diagnostic Neuroimaging

## 2017-08-08 VITALS — BP 134/80 | HR 64 | Ht 61.0 in | Wt 135.2 lb

## 2017-08-08 DIAGNOSIS — G5603 Carpal tunnel syndrome, bilateral upper limbs: Secondary | ICD-10-CM

## 2017-08-08 DIAGNOSIS — R7303 Prediabetes: Secondary | ICD-10-CM | POA: Diagnosis not present

## 2017-08-08 DIAGNOSIS — M5416 Radiculopathy, lumbar region: Secondary | ICD-10-CM | POA: Diagnosis not present

## 2017-08-08 NOTE — Patient Instructions (Signed)
CARPAL TUNNEL SYNDROME - follow up with Dr. Cyndia Diver (hand surgeon)  LEFT FOOT / TOE NUMBNESS - may be related to borderline diabetes + lumbar radiculopathy (h/o lumbar spine surgery in 1986) - recommend conservative mgmt as symptoms are mild --> exercise, nutrition, sugar control

## 2017-08-08 NOTE — Progress Notes (Signed)
[facility]  PATIENT: Diana Roach DOB: 01/08/1951  REFERRING CLINICIAN: Barnet Pall, MD HISTORY FROM: patient and chart review REASON FOR VISIT: new consult    HISTORICAL  CHIEF COMPLAINT:  Chief Complaint  Patient presents with  . Numbness    rm 7, New Pt, "numbness in hands > 1 year, numbness in left  x 5 months"    HISTORY OF PRESENT ILLNESS:   66 year old female here for evaluation of numbness and tingling in hands. 2015 patient had numbness and tingling in her hands, left worse right, was evaluated by Dr. Daryll Brod in 2016, diagnosed with carpal tunnel syndrome on basis of EMG nerve conduction study. Patient was treated conservatively with steroid injection. Patient had no further follow-up.  Since that time symptoms have been intermittent. In the past 5 months symptoms have increased. Patient having more numbness and tingling in her fingers, wrists, hands.   Patient also having some intermittent numbness and tingling in her left toes. This is similar to numbness and pain she had in 1986 when she had left lumbar radiculopathy and low back surgery.   REVIEW OF SYSTEMS: Full 14 system review of systems performed and negative with exception of: Allergies sleepiness snoring numbness wheezing shortness of breath blurred vision hearing loss constipation rash.  ALLERGIES: Allergies  Allergen Reactions  . Minocycline Other (See Comments)    Dizziness , "strange feeling"  . Codeine Itching  . Contrast Media [Iodinated Diagnostic Agents]     BP dropped  . Levaquin [Levofloxacin] Other (See Comments)    hallucinations    HOME MEDICATIONS: Outpatient Medications Prior to Visit  Medication Sig Dispense Refill  . albuterol (PROAIR HFA) 108 (90 Base) MCG/ACT inhaler Inhale 2 puffs into the lungs every 4 (four) hours as needed for wheezing or shortness of breath. For wheezing. 1 Inhaler 2  . Aspirin-Acetaminophen-Caffeine (GOODY HEADACHE PO) Take 1  packet by mouth daily as needed. For pain    . fluticasone (FLOVENT HFA) 110 MCG/ACT inhaler Inhale 1 puff into the lungs 2 (two) times daily. 1 Inhaler 12  . polyethylene glycol (MIRALAX / GLYCOLAX) packet Take 17 g by mouth as needed. Once or twice weekly as needed    . betamethasone dipropionate 0.05 % lotion Apply topically 2 (two) times daily. 60 mL 1   No facility-administered medications prior to visit.     PAST MEDICAL HISTORY: Past Medical History:  Diagnosis Date  . Asthma   . Diabetes mellitus without complication ([place])   . Diverticulitis   . Environmental allergies   . Lichen planus     PAST SURGICAL HISTORY: Past Surgical History:  Procedure Laterality Date  . APPENDECTOMY  1969  . BACK SURGERY  1986   lumbar spine  . COLONOSCOPY  2015    FAMILY HISTORY: Family History  Problem Relation Age of Onset  . Hypothyroidism Sister   . Cervical cancer Sister   . Heart disease Mother   . Cancer Brother        lung  . Heart attack Brother   . Colon cancer Neg Hx   . Pancreatic cancer Neg Hx   . Stomach cancer Neg Hx   . Esophageal cancer Neg Hx     SOCIAL HISTORY:  Social History   Social History  . Marital status: Widowed    Spouse name: N/A  . Number of children: 1  . Years of education: 10   Occupational History  . [employer]   Social History  Main Topics  . Smoking status: Former Smoker    Quit date: 10/25/1994  . Smokeless tobacco: Never Used  . Alcohol use No  . Drug use: No  . Sexual activity: Not on file   Other Topics Concern  . Not on file   Social History Narrative   Lives with daughter, son-in-law, grandson   Caffeine- sodas, 2 daily, coffee, 1 cup     PHYSICAL EXAM  GENERAL EXAM/CONSTITUTIONAL: Vitals:  Vitals:   08/08/17 1053  BP: 134/80  Pulse: 64  Weight: 135 lb 3.2 oz (61.3 kg)  Height: 5\' 1"  (1.549 m)     Body mass index is 25.55 kg/m.  Visual Acuity Screening   Right eye Left eye Both eyes    Without correction:     With correction: 20/30 20/40      Patient is in no distress; well developed, nourished and groomed; neck is supple  CARDIOVASCULAR:  Examination of carotid arteries is normal; no carotid bruits  Regular rate and rhythm, no murmurs  Examination of peripheral vascular system by observation and palpation is normal  EYES:  Ophthalmoscopic exam of optic discs and posterior segments is normal; no papilledema or hemorrhages  MUSCULOSKELETAL:  Gait, strength, tone, movements noted in Neurologic exam below  NEUROLOGIC: MENTAL STATUS:  No flowsheet data found.  awake, alert, oriented to person, place and time  recent and remote memory intact  normal attention and concentration  language fluent, comprehension intact, naming intact,   fund of knowledge appropriate  CRANIAL NERVE:   2nd - no papilledema on fundoscopic exam  2nd, 3rd, 4th, 6th - pupils equal and reactive to light, visual fields full to confrontation, extraocular muscles intact, no nystagmus  5th - facial sensation symmetric  7th - facial strength symmetric  8th - hearing intact  9th - palate elevates symmetrically, uvula midline  11th - shoulder shrug symmetric  12th - tongue protrusion midline  MOTOR:   normal bulk and tone, full strength in the BUE, BLE  SENSORY:   normal and symmetric to light touch, pinprick, temperature, vibration  BORDERLINE PHALEN'S  NEG TINEL'S  COORDINATION:   finger-nose-finger, fine finger movements normal  REFLEXES:   deep tendon reflexes present and symmetric  GAIT/STATION:   narrow based gait    DIAGNOSTIC DATA (LABS, IMAGING, TESTING) - I reviewed patient records, labs, notes, testing and imaging myself where available.  Lab Results  Component Value Date   WBC 10.3 01/25/2016   HGB 12.9 04/22/2016   HCT 38.8 01/25/2016   MCV 80.8 01/25/2016   PLT 387 01/25/2016      Component Value Date/Time   NA 140 06/16/2017  1659   K 4.8 06/16/2017 1659   CL 104 06/16/2017 1659   CO2 23 06/16/2017 1659   GLUCOSE 96 06/16/2017 1659   GLUCOSE 107 (H) 08/11/2016 1208   BUN 13 06/16/2017 1659   CREATININE 0.87 06/16/2017 1659   CREATININE 0.88 08/11/2016 1208   CALCIUM 9.6 06/16/2017 1659   PROT 7.4 02/14/2017 1608   ALBUMIN 4.4 02/14/2017 1608   AST 16 02/14/2017 1608   ALT 13 02/14/2017 1608   ALKPHOS 98 02/14/2017 1608   BILITOT 0.3 02/14/2017 1608   GFRNONAA 70 06/16/2017 1659   GFRNONAA 69 08/11/2016 1208   GFRAA 80 06/16/2017 1659   GFRAA 80 08/11/2016 1208   Lab Results  Component Value Date   CHOL 155 10/15/2014   HDL 39 (L) 10/15/2014   LDLCALC 90 10/15/2014   TRIG 131  10/15/2014   CHOLHDL 4.0 10/15/2014   Lab Results  Component Value Date   HGBA1C 6.4 07/29/2017   Lab Results  Component Value Date   VITAMINB12 327 06/16/2017   Lab Results  Component Value Date   TSH 1.787 01/09/2013    11/22/14 EMG/NCS (Dr. Zebedee Iba) - carpal tunnel syndrome (left median motor distal latency 4.68ms; right median motor distal latency 4.30ms; left median sensory latency 3.1ms; right median sensory latency 2.40ms)    ASSESSMENT AND PLAN  66 y.o. year old female here with Numbness and tingling in hands and left foot. Patient likely has bilateral carpal tunnel syndrome (left worse than right). Also has mild left lumbar radiculopathy. Symptoms may be exacerbated by borderline elevated hemoglobin A1c.   Dx:  1. Bilateral carpal tunnel syndrome   2. Lumbar radiculopathy   3. Borderline diabetes      PLAN:  CARPAL TUNNEL SYNDROME (left worse than right) - follow up with Dr. Cyndia Diver (hand surgeon); he saw patient in 2016; patient had EMG/NCS there, and had trial of cortisone injection  LEFT FOOT / TOE NUMBNESS - may be related to borderline diabetes + lumbar radiculopathy (h/o lumbar spine surgery in 1986) - recommend conservative mgmt as symptoms are mild --> exercise, nutrition, sugar  control  Return if symptoms worsen or fail to improve, for return to PCP.    Diana Bombard, MD 85/27/7824, 23:53 AM Certified in Neurology, Neurophysiology and Neuroimaging  [facility] [address], [place], [place] [address] [phone]

## 2017-09-28 DIAGNOSIS — L821 Other seborrheic keratosis: Secondary | ICD-10-CM | POA: Diagnosis not present

## 2017-09-28 DIAGNOSIS — L439 Lichen planus, unspecified: Secondary | ICD-10-CM | POA: Diagnosis not present

## 2017-09-28 DIAGNOSIS — D229 Melanocytic nevi, unspecified: Secondary | ICD-10-CM | POA: Diagnosis not present

## 2017-10-10 DIAGNOSIS — G5601 Carpal tunnel syndrome, right upper limb: Secondary | ICD-10-CM | POA: Diagnosis not present

## 2017-10-10 DIAGNOSIS — G5602 Carpal tunnel syndrome, left upper limb: Secondary | ICD-10-CM | POA: Diagnosis not present

## 2017-10-10 DIAGNOSIS — M79641 Pain in right hand: Secondary | ICD-10-CM | POA: Diagnosis not present

## 2017-10-10 DIAGNOSIS — M79642 Pain in left hand: Secondary | ICD-10-CM | POA: Diagnosis not present

## 2017-10-10 DIAGNOSIS — G5603 Carpal tunnel syndrome, bilateral upper limbs: Secondary | ICD-10-CM | POA: Diagnosis not present

## 2017-10-20 ENCOUNTER — Other Ambulatory Visit: Payer: Self-pay

## 2017-10-20 ENCOUNTER — Encounter: Payer: Self-pay | Admitting: Family Medicine

## 2017-10-20 ENCOUNTER — Ambulatory Visit: Payer: PPO | Admitting: Family Medicine

## 2017-10-20 VITALS — BP 140/82 | HR 58 | Temp 98.1°F | Ht 61.0 in | Wt 136.2 lb

## 2017-10-20 DIAGNOSIS — J4540 Moderate persistent asthma, uncomplicated: Secondary | ICD-10-CM | POA: Diagnosis not present

## 2017-10-20 DIAGNOSIS — E119 Type 2 diabetes mellitus without complications: Secondary | ICD-10-CM

## 2017-10-20 DIAGNOSIS — R7303 Prediabetes: Secondary | ICD-10-CM

## 2017-10-20 LAB — POCT GLYCOSYLATED HEMOGLOBIN (HGB A1C): Hemoglobin A1C: 6.5

## 2017-10-20 NOTE — Progress Notes (Signed)
Date of Visit: 10/20/2017   HPI:  Patient presents to recheck A1c. Previously had borderline A1c's (6.5, 6.4) so we had decided that today's test would decide whether to label her as having diabetes.  A1c today is 6.5. Patient reports overall she is doing well. Has family history of diabetes. No chest pain or shortness of breath.   Asthma - using flovent daily. Has not needed albuterol inhaler in several months. Breathing overall doing well.  ROS: See HPI.  [place]: history of asthma, hyperlipidemia, lichen planus, diverticulosis  PHYSICAL EXAM: BP 140/82   Pulse (!) 58   Temp 98.1 F (36.7 C) (Oral)   Ht 5\' 1"  (1.549 m)   Wt 136 lb 3.2 oz (61.8 kg)   SpO2 96%   BMI 25.73 kg/m  Gen: no acute distress, pleasant, cooperative HEENT: normocephalic, atraumatic, moist mucous membranes  Heart: regular rate and rhythm, no murmur Lungs: clear to auscultation bilaterally, normal work of breathing  Neuro: alert, grossly nonfocal, speech normal Ext: No appreciable lower extremity edema bilaterally   ASSESSMENT/PLAN:  Health maintenance:  -counseled on need for diabetic eye exam -advised to schedule Annual Wellness Visit   Asthma, moderate persistent Stable, continue current regimen  Type 2 diabetes mellitus without complication, without long-term current use of insulin ([place]) New dx officially with another A1c of 6.5. Current diet controlled. Counseled patient on diabetes care, screenings, and need for follow up.  Cardiac: check lipids to assess need for statin, aspirin Renal: will get urine micro next visit Eye: advised to schedule eye exam Foot: done today, encouraged looking at feet daily Immunizations: UTD   FOLLOW UP: Follow up in 6 mos for next A1c check  [name], [place]

## 2017-10-20 NOTE — Patient Instructions (Signed)
labwork today  Schedule your eye exam  Schedule an annual wellness visit with our nurse Lauren. This is a longer visit to focus on your wellness goals and how to keep you healthy. It is free.  See handout below on how to eat healthy with diabetes  Take the flovent once per day  Be well, Dr. Ardelia Roach    Diabetes Mellitus and Nutrition When you have diabetes (diabetes mellitus), it is very important to have healthy eating habits because your blood sugar (glucose) levels are greatly affected by what you eat and drink. Eating healthy foods in the appropriate amounts, at about the same times every day, can help you:  Control your blood glucose.  Lower your risk of heart disease.  Improve your blood pressure.  Reach or maintain a healthy weight.  Every person with diabetes is different, and each person has different needs for a meal plan. Your health care provider may recommend that you work with a diet and nutrition specialist (dietitian) to make a meal plan that is best for you. Your meal plan may vary depending on factors such as:  The calories you need.  The medicines you take.  Your weight.  Your blood glucose, blood pressure, and cholesterol levels.  Your activity level.  Other health conditions you have, such as heart or kidney disease.  How do carbohydrates affect me? Carbohydrates affect your blood glucose level more than any other type of food. Eating carbohydrates naturally increases the amount of glucose in your blood. Carbohydrate counting is a method for keeping track of how many carbohydrates you eat. Counting carbohydrates is important to keep your blood glucose at a healthy level, especially if you use insulin or take certain oral diabetes medicines. It is important to know how many carbohydrates you can safely have in each meal. This is different for every person. Your dietitian can help you calculate how many carbohydrates you should have at each meal and for  snack. Foods that contain carbohydrates include:  Bread, cereal, rice, pasta, and crackers.  Potatoes and corn.  Peas, beans, and lentils.  Milk and yogurt.  Fruit and juice.  Desserts, such as cakes, cookies, ice cream, and candy.  How does alcohol affect me? Alcohol can cause a sudden decrease in blood glucose (hypoglycemia), especially if you use insulin or take certain oral diabetes medicines. Hypoglycemia can be a life-threatening condition. Symptoms of hypoglycemia (sleepiness, dizziness, and confusion) are similar to symptoms of having too much alcohol. If your health care provider says that alcohol is safe for you, follow these guidelines:  Limit alcohol intake to no more than 1 drink per day for nonpregnant women and 2 drinks per day for men. One drink equals 12 oz of beer, 5 oz of wine, or 1 oz of hard liquor.  Do not drink on an empty stomach.  Keep yourself hydrated with water, diet soda, or unsweetened iced tea.  Keep in mind that regular soda, juice, and other mixers may contain a lot of sugar and must be counted as carbohydrates.  What are tips for following this plan? Reading food labels  Start by checking the serving size on the label. The amount of calories, carbohydrates, fats, and other nutrients listed on the label are based on one serving of the food. Many foods contain more than one serving per package.  Check the total grams (g) of carbohydrates in one serving. You can calculate the number of servings of carbohydrates in one serving by dividing the total  carbohydrates by 15. For example, if a food has 30 g of total carbohydrates, it would be equal to 2 servings of carbohydrates.  Check the number of grams (g) of saturated and trans fats in one serving. Choose foods that have low or no amount of these fats.  Check the number of milligrams (mg) of sodium in one serving. Most people should limit total sodium intake to less than 2,300 mg per day.  Always  check the nutrition information of foods labeled as "low-fat" or "nonfat". These foods may be higher in added sugar or refined carbohydrates and should be avoided.  Talk to your dietitian to identify your daily goals for nutrients listed on the label. Shopping  Avoid buying canned, premade, or processed foods. These foods tend to be high in fat, sodium, and added sugar.  Shop around the outside edge of the grocery store. This includes fresh fruits and vegetables, bulk grains, fresh meats, and fresh dairy. Cooking  Use low-heat cooking methods, such as baking, instead of high-heat cooking methods like deep frying.  Cook using healthy oils, such as olive, canola, or sunflower oil.  Avoid cooking with butter, cream, or high-fat meats. Meal planning  Eat meals and snacks regularly, preferably at the same times every day. Avoid going long periods of time without eating.  Eat foods high in fiber, such as fresh fruits, vegetables, beans, and whole grains. Talk to your dietitian about how many servings of carbohydrates you can eat at each meal.  Eat 4-6 ounces of lean protein each day, such as lean meat, chicken, fish, eggs, or tofu. 1 ounce is equal to 1 ounce of meat, chicken, or fish, 1 egg, or 1/4 cup of tofu.  Eat some foods each day that contain healthy fats, such as avocado, nuts, seeds, and fish. Lifestyle   Check your blood glucose regularly.  Exercise at least 30 minutes 5 or more days each week, or as told by your health care provider.  Take medicines as told by your health care provider.  Do not use any products that contain nicotine or tobacco, such as cigarettes and e-cigarettes. If you need help quitting, ask your health care provider.  Work with a Social worker or diabetes educator to identify strategies to manage stress and any emotional and social challenges. What are some questions to ask my health care provider?  Do I need to meet with a diabetes educator?  Do I need  to meet with a dietitian?  What number can I call if I have questions?  When are the best times to check my blood glucose? Where to find more information:  American Diabetes Association: diabetes.org/food-and-fitness/food  Academy of Nutrition and Dietetics: PokerClues.dk  [employer] of Diabetes and Digestive and Kidney Diseases (NIH): ContactWire.be Summary  A healthy meal plan will help you control your blood glucose and maintain a healthy lifestyle.  Working with a diet and nutrition specialist (dietitian) can help you make a meal plan that is best for you.  Keep in mind that carbohydrates and alcohol have immediate effects on your blood glucose levels. It is important to count carbohydrates and to use alcohol carefully. This information is not intended to replace advice given to you by your health care provider. Make sure you discuss any questions you have with your health care provider. Document Released: 07/08/2005 Document Revised: 11/15/2016 Document Reviewed: 11/15/2016 Elsevier Interactive Patient Education  [employer].

## 2017-10-21 LAB — LIPID PANEL
CHOLESTEROL TOTAL: 188 mg/dL (ref 100–199)
Chol/HDL Ratio: 3.5 ratio (ref 0.0–4.4)
HDL: 53 mg/dL (ref >39)
LDL CALC: 106 mg/dL — AB (ref 0–99)
TRIGLYCERIDES: 143 mg/dL (ref 0–149)
VLDL Cholesterol Cal: 29 mg/dL (ref 5–40)

## 2017-10-21 LAB — CMP14+EGFR
A/G RATIO: 1.5 (ref 1.2–2.2)
ALT: 13 IU/L (ref 0–32)
AST: 19 IU/L (ref 0–40)
Albumin: 4.3 g/dL (ref 3.6–4.8)
Alkaline Phosphatase: 93 IU/L (ref 39–117)
BILIRUBIN TOTAL: 0.3 mg/dL (ref 0.0–1.2)
BUN/Creatinine Ratio: 12 (ref 12–28)
BUN: 10 mg/dL (ref 8–27)
CO2: 22 mmol/L (ref 20–29)
Calcium: 9.8 mg/dL (ref 8.7–10.3)
Chloride: 103 mmol/L (ref 96–106)
Creatinine, Ser: 0.85 mg/dL (ref 0.57–1.00)
GFR, EST AFRICAN AMERICAN: 83 mL/min/{1.73_m2} (ref >59)
GFR, EST NON AFRICAN AMERICAN: 72 mL/min/{1.73_m2} (ref >59)
GLOBULIN, TOTAL: 2.9 g/dL (ref 1.5–4.5)
Glucose: 123 mg/dL — ABNORMAL HIGH (ref 65–99)
POTASSIUM: 4.9 mmol/L (ref 3.5–5.2)
SODIUM: 139 mmol/L (ref 134–144)
Total Protein: 7.2 g/dL (ref 6.0–8.5)

## 2017-10-24 DIAGNOSIS — E119 Type 2 diabetes mellitus without complications: Secondary | ICD-10-CM | POA: Insufficient documentation

## 2017-10-24 DIAGNOSIS — E1140 Type 2 diabetes mellitus with diabetic neuropathy, unspecified: Secondary | ICD-10-CM | POA: Insufficient documentation

## 2017-10-24 NOTE — Assessment & Plan Note (Signed)
Stable, continue current regimen 

## 2017-10-24 NOTE — Assessment & Plan Note (Signed)
New dx officially with another A1c of 6.5. Current diet controlled. Counseled patient on diabetes care, screenings, and need for follow up.  Cardiac: check lipids to assess need for statin, aspirin Renal: will get urine micro next visit Eye: advised to schedule eye exam Foot: done today, encouraged looking at feet daily Immunizations: UTD

## 2017-11-04 ENCOUNTER — Telehealth: Payer: Self-pay | Admitting: Family Medicine

## 2017-11-04 NOTE — Telephone Encounter (Signed)
Called patient to review labs, which look good. Lipids not too bad but with having diabetes, her 10 year ASCVD risk calculated at >13%. I recommended she take a cholesterol medication to lower her overall risk of CV disease, but patient hesitant to do this. Says other members of her family have taken cholesterol medications and did not tolerate them. Offered to send in trial of the medication if she changes her mind. She will contemplate and let me know. Patient appreciative.  Leeanne Rio, MD

## 2017-11-21 DIAGNOSIS — H524 Presbyopia: Secondary | ICD-10-CM | POA: Diagnosis not present

## 2017-11-21 DIAGNOSIS — H52223 Regular astigmatism, bilateral: Secondary | ICD-10-CM | POA: Diagnosis not present

## 2017-11-21 DIAGNOSIS — H5203 Hypermetropia, bilateral: Secondary | ICD-10-CM | POA: Diagnosis not present

## 2017-11-29 DIAGNOSIS — G5602 Carpal tunnel syndrome, left upper limb: Secondary | ICD-10-CM | POA: Diagnosis not present

## 2017-12-13 DIAGNOSIS — M79642 Pain in left hand: Secondary | ICD-10-CM | POA: Diagnosis not present

## 2017-12-26 DIAGNOSIS — M79642 Pain in left hand: Secondary | ICD-10-CM | POA: Diagnosis not present

## 2018-01-03 ENCOUNTER — Other Ambulatory Visit: Payer: Self-pay | Admitting: Family Medicine

## 2018-01-03 DIAGNOSIS — Z1231 Encounter for screening mammogram for malignant neoplasm of breast: Secondary | ICD-10-CM

## 2018-01-05 ENCOUNTER — Telehealth: Payer: Self-pay | Admitting: Family Medicine

## 2018-01-05 NOTE — Telephone Encounter (Signed)
Placed in MDs box to be filled out. Aubriee Szeto, CMA  

## 2018-01-05 NOTE — Telephone Encounter (Signed)
Silver sneakers form dropped off for at front desk for completion.  Verified that patient section of form has been completed.  Last DOS/WCC with PCP was 10/20/17.  Placed form in team folder to be completed by clinical staff.  Crista Luria

## 2018-01-11 ENCOUNTER — Other Ambulatory Visit: Payer: Self-pay | Admitting: Family Medicine

## 2018-01-13 NOTE — Telephone Encounter (Signed)
Form faxed per pt. Request. Wallace Cullens, RN

## 2018-01-26 ENCOUNTER — Ambulatory Visit
Admission: RE | Admit: 2018-01-26 | Discharge: 2018-01-26 | Disposition: A | Discharge status: Home / Self Care | Payer: PPO | Source: Ambulatory Visit | Attending: Family Medicine | Admitting: Family Medicine

## 2018-01-26 DIAGNOSIS — Z1231 Encounter for screening mammogram for malignant neoplasm of breast: Secondary | ICD-10-CM | POA: Diagnosis not present

## 2018-02-20 DIAGNOSIS — K5900 Constipation, unspecified: Secondary | ICD-10-CM | POA: Diagnosis not present

## 2018-02-20 DIAGNOSIS — M549 Dorsalgia, unspecified: Secondary | ICD-10-CM | POA: Diagnosis not present

## 2018-02-20 DIAGNOSIS — R1031 Right lower quadrant pain: Secondary | ICD-10-CM | POA: Diagnosis not present

## 2018-02-21 ENCOUNTER — Ambulatory Visit: Payer: PPO | Admitting: Internal Medicine

## 2018-03-22 DIAGNOSIS — R1031 Right lower quadrant pain: Secondary | ICD-10-CM | POA: Diagnosis not present

## 2018-03-22 DIAGNOSIS — K5900 Constipation, unspecified: Secondary | ICD-10-CM | POA: Diagnosis not present

## 2018-03-23 DIAGNOSIS — G5602 Carpal tunnel syndrome, left upper limb: Secondary | ICD-10-CM | POA: Diagnosis not present

## 2018-03-31 ENCOUNTER — Encounter: Payer: Self-pay | Admitting: Family Medicine

## 2018-03-31 ENCOUNTER — Ambulatory Visit (INDEPENDENT_AMBULATORY_CARE_PROVIDER_SITE_OTHER): Payer: PPO | Admitting: Family Medicine

## 2018-03-31 ENCOUNTER — Telehealth: Payer: Self-pay

## 2018-03-31 VITALS — BP 100/60 | HR 61 | Temp 97.9°F | Ht 61.0 in | Wt 139.6 lb

## 2018-03-31 DIAGNOSIS — J4540 Moderate persistent asthma, uncomplicated: Secondary | ICD-10-CM

## 2018-03-31 DIAGNOSIS — L439 Lichen planus, unspecified: Secondary | ICD-10-CM | POA: Diagnosis not present

## 2018-03-31 DIAGNOSIS — R7303 Prediabetes: Secondary | ICD-10-CM

## 2018-03-31 DIAGNOSIS — R1031 Right lower quadrant pain: Secondary | ICD-10-CM

## 2018-03-31 DIAGNOSIS — E119 Type 2 diabetes mellitus without complications: Secondary | ICD-10-CM | POA: Diagnosis not present

## 2018-03-31 LAB — POCT GLYCOSYLATED HEMOGLOBIN (HGB A1C): HbA1c, POC (controlled diabetic range): 6.4 % (ref 0.0–7.0)

## 2018-03-31 NOTE — Assessment & Plan Note (Signed)
Encouraged her to call dermatologist since this has been flaring.

## 2018-03-31 NOTE — Assessment & Plan Note (Signed)
Excellent control with lifestyle changes - A1c 6.4. Urine microalbumin today. Will request records from eye doctor (Dr. Bing Plume). Follow up in 6 months for diabetes.

## 2018-03-31 NOTE — Assessment & Plan Note (Signed)
Occurring for the last month. Saw GI who thought cause may be musculoskeletal. Unremarkable pelvic exam today except for mild tenderness in RLQ. Will check pelvic ultrasound to evaluate. Also check xray of R hip to rule out hip arthritis as cause. Follow up in 1 month if pain persisting.

## 2018-03-31 NOTE — Progress Notes (Signed)
Date of Visit: 03/31/2018   HPI:  Diabetes - currently diet controlled. A1c today 6.4.   RLQ pain - has had this for about a month. Saw GI doctor for this, Dr. Collene Mares. Had been given a medrol dosepack with improvement in pain. Dr. Collene Mares thought pain likely musculoskeletal in origin. No vaginal discharge. Has not had a pelvic exam since the pain began. Does note pain worse in groin when she flexes R leg upward. Has bowel movements regularly when she uses miralax. He had also given her an rx for linzess but she has not gotten it filled, as when she took it previously it made her stools too loose.  Lichen planus - flaring lately on bilateral shins. Has not seen dermatology in several months. They had given her a cream to use but it's not working. The medrol dosepak helped her lichen planus while she was on the medication.   Asthma - using flovent 1 puff twice daily with good results. Rarely needs albuterol.  ROS: See HPI.  [place]: history of asthma, hyperlipidemia, type 2 diabetes, lichen planus  PHYSICAL EXAM: BP 100/60 (BP Location: Left Arm, Patient Position: Sitting, Cuff Size: Normal)   Pulse 61   Temp 97.9 F (36.6 C) (Oral)   Ht 5\' 1"  (1.549 m)   Wt 139 lb 9.6 oz (63.3 kg)   SpO2 98%   BMI 26.38 kg/m  Gen: no acute distress, pleasant, cooperative, well appaering HEENT: normocephalic, atraumatic, moist mucous membranes  Heart: regular rate and rhythm  Lungs: clear to auscultation bilaterally normal work of breathing  Neuro: alert grossly nonfocal, speech normal Abdomen: soft, nontender to palpation except very mild tenderness in RLQ. Normoactive bowel sounds. No masses GU: normal appearing external genitalia without lesions. Vagina is moist with clear discharge. Cervix normal in appearance. No cervical motion tenderness. Mild tenderness in RLQ on bimanual exam. No adnexal masses. No R sided inguinal lymphadenopathy appreciated.  ASSESSMENT/PLAN:  Health maintenance:  -request eye  doctor records (Dr. Bing Plume) -urine microalbumin today -otherwise UTD on HM items  Asthma, moderate persistent Well controlled on flovent with as needed albuterol, continue current regimen.  Type 2 diabetes mellitus without complication, without long-term current use of insulin ([facility]) Excellent control with lifestyle changes - A1c 6.4. Urine microalbumin today. Will request records from eye doctor (Dr. Bing Plume). Follow up in 6 months for diabetes.  Lichen planus Encouraged her to call dermatologist since this has been flaring.  RLQ abdominal pain Occurring for the last month. Saw GI who thought cause may be musculoskeletal. Unremarkable pelvic exam today except for mild tenderness in RLQ. Will check pelvic ultrasound to evaluate. Also check xray of R hip to rule out hip arthritis as cause. Follow up in 1 month if pain persisting.  FOLLOW UP: Follow up in 1 month for abdominal pain if persisting Otherwise follow up in 6 months, sooner if needed  [name], [place]

## 2018-03-31 NOTE — Patient Instructions (Signed)
Checking hip xray and pelvic ultrasound Diabetes looks good  Call dermatologist about your lichen planus  If abdominal pain persists schedule follow up in 1 month. If gets better, see me in about 6 months.  Be well, [name]

## 2018-03-31 NOTE — Assessment & Plan Note (Signed)
Well controlled on flovent with as needed albuterol, continue current regimen.

## 2018-03-31 NOTE — Telephone Encounter (Signed)
Informed patient of Ultrasound appointment 04/05/2018 at [facility] at 1400 with a show time of 1345.   Patient was told to arrive with a full bladder.  Ozella Almond, [place]

## 2018-04-01 LAB — MICROALBUMIN / CREATININE URINE RATIO: Creatinine, Urine: 15.9 mg/dL

## 2018-04-03 ENCOUNTER — Encounter: Payer: Self-pay | Admitting: Family Medicine

## 2018-04-03 ENCOUNTER — Ambulatory Visit
Admission: RE | Admit: 2018-04-03 | Discharge: 2018-04-03 | Disposition: A | Discharge status: Home / Self Care | Payer: PPO | Source: Ambulatory Visit | Attending: Family Medicine | Admitting: Family Medicine

## 2018-04-03 DIAGNOSIS — M1611 Unilateral primary osteoarthritis, right hip: Secondary | ICD-10-CM | POA: Diagnosis not present

## 2018-04-03 DIAGNOSIS — R1031 Right lower quadrant pain: Secondary | ICD-10-CM

## 2018-04-05 ENCOUNTER — Ambulatory Visit (HOSPITAL_COMMUNITY)
Admission: RE | Admit: 2018-04-05 | Discharge: 2018-04-05 | Disposition: A | Discharge status: Home / Self Care | Payer: PPO | Source: Ambulatory Visit | Attending: Family Medicine | Admitting: Family Medicine

## 2018-04-05 DIAGNOSIS — Z780 Asymptomatic menopausal state: Secondary | ICD-10-CM | POA: Diagnosis not present

## 2018-04-05 DIAGNOSIS — R1031 Right lower quadrant pain: Secondary | ICD-10-CM | POA: Insufficient documentation

## 2018-04-06 ENCOUNTER — Encounter: Payer: Self-pay | Admitting: Family Medicine

## 2018-04-12 ENCOUNTER — Encounter: Payer: Self-pay | Admitting: Family Medicine

## 2018-06-16 ENCOUNTER — Other Ambulatory Visit: Payer: Self-pay

## 2018-06-16 ENCOUNTER — Emergency Department (HOSPITAL_COMMUNITY): Payer: PPO

## 2018-06-16 ENCOUNTER — Emergency Department (HOSPITAL_COMMUNITY)
Admission: EM | Admit: 2018-06-16 | Discharge: 2018-06-16 | Disposition: A | Discharge status: Home / Self Care | Payer: PPO | Attending: Emergency Medicine | Admitting: Emergency Medicine

## 2018-06-16 DIAGNOSIS — Z7982 Long term (current) use of aspirin: Secondary | ICD-10-CM | POA: Insufficient documentation

## 2018-06-16 DIAGNOSIS — R079 Chest pain, unspecified: Secondary | ICD-10-CM | POA: Diagnosis not present

## 2018-06-16 DIAGNOSIS — J4540 Moderate persistent asthma, uncomplicated: Secondary | ICD-10-CM | POA: Insufficient documentation

## 2018-06-16 DIAGNOSIS — E119 Type 2 diabetes mellitus without complications: Secondary | ICD-10-CM | POA: Diagnosis not present

## 2018-06-16 DIAGNOSIS — M25512 Pain in left shoulder: Secondary | ICD-10-CM

## 2018-06-16 DIAGNOSIS — Z87891 Personal history of nicotine dependence: Secondary | ICD-10-CM | POA: Insufficient documentation

## 2018-06-16 DIAGNOSIS — Z79899 Other long term (current) drug therapy: Secondary | ICD-10-CM | POA: Diagnosis not present

## 2018-06-16 DIAGNOSIS — R9431 Abnormal electrocardiogram [ECG] [EKG]: Secondary | ICD-10-CM | POA: Diagnosis not present

## 2018-06-16 DIAGNOSIS — M62838 Other muscle spasm: Secondary | ICD-10-CM | POA: Diagnosis not present

## 2018-06-16 DIAGNOSIS — M542 Cervicalgia: Secondary | ICD-10-CM | POA: Diagnosis not present

## 2018-06-16 DIAGNOSIS — R0789 Other chest pain: Secondary | ICD-10-CM

## 2018-06-16 DIAGNOSIS — M79602 Pain in left arm: Secondary | ICD-10-CM | POA: Diagnosis not present

## 2018-06-16 LAB — BASIC METABOLIC PANEL
ANION GAP: 9 (ref 5–15)
BUN: 12 mg/dL (ref 8–23)
CO2: 23 mmol/L (ref 22–32)
Calcium: 9.5 mg/dL (ref 8.9–10.3)
Chloride: 107 mmol/L (ref 98–111)
Creatinine, Ser: 1 mg/dL (ref 0.44–1.00)
GFR calc non Af Amer: 57 mL/min — ABNORMAL LOW (ref >60)
GLUCOSE: 105 mg/dL — AB (ref 70–99)
POTASSIUM: 4.1 mmol/L (ref 3.5–5.1)
Sodium: 139 mmol/L (ref 135–145)

## 2018-06-16 LAB — CBC
HEMATOCRIT: 44.6 % (ref 36.0–46.0)
HEMOGLOBIN: 13.8 g/dL (ref 12.0–15.0)
MCH: 25.5 pg — AB (ref 26.0–34.0)
MCHC: 30.9 g/dL (ref 30.0–36.0)
MCV: 82.4 fL (ref 78.0–100.0)
Platelets: 413 10*3/uL — ABNORMAL HIGH (ref 150–400)
RBC: 5.41 MIL/uL — AB (ref 3.87–5.11)
RDW: 15.2 % (ref 11.5–15.5)
WBC: 9.7 10*3/uL (ref 4.0–10.5)

## 2018-06-16 LAB — I-STAT TROPONIN, ED: TROPONIN I, POC: 0 ng/mL (ref 0.00–0.08)

## 2018-06-16 NOTE — ED Provider Notes (Signed)
[facility] EMERGENCY DEPARTMENT Provider Note  CSN: [id number] Arrival date & time: 06/16/18  1643    History   Chief Complaint Chief Complaint  Patient presents with  . Chest Pain    HPI Diana Roach is a 67 y.o. female with a medical history of Type 2 DM and HLD who presented to the ED for left sided chest pain x2 days. Patient describes aching chest and shoulder pain that is worse with movement that began after she went swimming. Also endorses left sided neck pain. Denies fever, fatigue, diaphoresis, SOB, palpitations, cough, leg swelling, abdominal pain, N/V, bowel or urinary changes. Patient has tried nothing prior to coming to the ED.  Past Medical History:  Diagnosis Date  . Asthma   . Diabetes mellitus without complication ([place])   . Diverticulitis   . Environmental allergies   . Lichen planus     Patient Active Problem List   Diagnosis Date Noted  . Type 2 diabetes mellitus without complication, without long-term current use of insulin ([place]) 10/24/2017  . Plantar fasciitis 07/29/2017  . Prediabetes 07/29/2017  . Numbness 06/17/2017  . Asthma with acute exacerbation 12/27/2016  . Right flank pain 08/12/2016  . Varicose vein of leg 03/09/2016  . UPJ obstruction, congenital 01/28/2016  . Lumbosacral radiculopathy 01/27/2016  . Acute sinusitis 10/30/2015  . Diverticulosis 10/01/2014  . Radiculopathy, cervical 09/04/2014  . Bilateral finger numbness 09/04/2014  . RLQ abdominal pain 04/30/2014  . Abdominal pain 04/24/2014  . Rotator cuff tendinitis 07/13/2013  . Neck nodule 05/04/2013  . Asthma, moderate persistent 05/13/2011  . Lichen planus 97/11/6376  . 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 Rash and nonspecific skin eruption 12/15/2010  . HYPERLIPIDEMIA 12/22/2006  . INCONTINENCE, STRESS, FEMALE 12/22/2006    Past Surgical History:  Procedure Laterality Date  . APPENDECTOMY  1969  . BACK SURGERY  1986   lumbar spine  . COLONOSCOPY   2015     OB History   None      Home Medications    Prior to Admission medications   Medication Sig Start Date End Date Taking? Authorizing Provider  albuterol (PROAIR HFA) 108 (90 Base) MCG/ACT inhaler Inhale 2 puffs into the lungs every 4 (four) hours as needed for wheezing or shortness of breath. For wheezing. Patient taking differently: Inhale 2 puffs into the lungs every 4 (four) hours as needed for wheezing or shortness of breath.  12/27/16  Yes Bacigalupo, Dionne Bucy, MD  Aspirin-Acetaminophen-Caffeine (GOODY HEADACHE PO) Take 1 packet by mouth as needed (for headaches or pain).    Yes [provider]  FLOVENT HFA 110 MCG/ACT inhaler INHALE 1 PUFF BY MOUTH INTO THE LUNGS TWICE DAILY Patient taking differently: Inhale 1 puff into the lungs 2 (two) times daily.  01/11/18  Yes Leeanne Rio, MD  polyethylene glycol [facility] / Floria Raveling) packet Take 17 g by mouth See admin instructions. Mix 17 grams into 4-8 ounces of water and drink one to two times a week as needed for constipation   Yes [provider]  triamcinolone cream (KENALOG) 0.1 % Apply 1 application topically See admin instructions. Apply to bilateral legs two to three times a day for Lichen Planus   Yes [provider]    Family History Family History  Problem Relation Age of Onset  . Hypothyroidism Sister   . Cervical cancer Sister   . Heart disease Mother   . Cancer Brother        lung  .  Heart attack Brother   . Colon cancer Neg Hx   . Pancreatic cancer Neg Hx   . Stomach cancer Neg Hx   . Esophageal cancer Neg Hx   . Breast cancer Neg Hx     Social History Social History   Tobacco Use  . Smoking status: Former Smoker    Last attempt to quit: 10/25/1994    Years since quitting: 23.6  . Smokeless tobacco: Never Used  Substance Use Topics  . Alcohol use: No  . Drug use: No     Allergies   Codeine; Minocycline; Contrast media [iodinated diagnostic agents]; and Levaquin  [levofloxacin]   Review of Systems Review of Systems  Constitutional: Negative for chills, diaphoresis, fatigue and fever.  HENT: Negative.   Eyes: Negative.   Respiratory: Negative for cough, chest tightness and shortness of breath.   Cardiovascular: Positive for chest pain. Negative for palpitations and leg swelling.  Gastrointestinal: Negative for abdominal pain, constipation, diarrhea, nausea and vomiting.  Genitourinary: Negative.   Musculoskeletal: Positive for neck pain. Negative for arthralgias, back pain and joint swelling.  Skin: Negative.   Neurological: Negative.   Hematological: Negative.    Physical Exam Updated Vital Signs BP 121/83   Pulse 64   Temp 97.9 F (36.6 C) (Oral)   Resp 15   Ht 5\' 1"  (1.549 m)   Wt 63.5 kg   SpO2 95%   BMI 26.45 kg/m   Physical Exam  Constitutional: Vital signs are normal. She appears well-developed and well-nourished. She is cooperative. No distress.  Neck: Normal range of motion. Neck supple. Muscular tenderness present. No spinous process tenderness present. Normal range of motion present.  Cardiovascular: Normal rate, regular rhythm and normal heart sounds.  Pulses:      Radial pulses are 2+ on the right side, and 2+ on the left side.  Pulmonary/Chest: Effort normal and breath sounds normal.  Abdominal: Soft. Normal appearance. There is no tenderness.  Musculoskeletal:       Left shoulder: She exhibits tenderness. She exhibits normal range of motion and no bony tenderness.       Left elbow: Normal.       Cervical back: She exhibits tenderness and spasm. She exhibits normal range of motion and no bony tenderness.  Full active and passive ROM of upper extremities bilaterally with 5/5. Endorsed pain shoulder ab/adduction, flexion and extension.  Neurological: She is alert. She has normal strength. She displays no atrophy. No sensory deficit. She exhibits normal muscle tone.  Reflex Scores:      Bicep reflexes are 2+ on the right  side and 2+ on the left side.      Brachioradialis reflexes are 2+ on the right side and 2+ on the left side. Skin: Skin is warm and intact.  Nursing note and vitals reviewed.  ED Treatments / Results  Labs (all labs ordered are listed, but only abnormal results are displayed) Labs Reviewed  BASIC METABOLIC PANEL - Abnormal; Notable for the following components:      Result Value   Glucose, Bld 105 (*)    GFR calc non Af Amer 57 (*)    All other components within normal limits  CBC - Abnormal; Notable for the following components:   RBC 5.41 (*)    MCH 25.5 (*)    Platelets 413 (*)    All other components within normal limits  I-STAT TROPONIN, ED    EKG EKG Interpretation  Date/Time:  Friday June 16 2018 16:49:01 EDT  Ventricular Rate:  62 PR Interval:  152 QRS Duration: 72 QT Interval:  408 QTC Calculation: 414 R Axis:   51 Text Interpretation:  Normal sinus rhythm Normal ECG When compared with ECG of 04/22/2016, No significant change was found Confirmed by Delora Fuel ([id number]) on 06/16/2018 11:34:00 PM   Radiology Dg Chest 2 View  Result Date: 06/16/2018 CLINICAL DATA:  Chest pain and dyspnea with left arm pain beginning yesterday morning. EXAM: CHEST - 2 VIEW COMPARISON:  04/30/2011 FINDINGS: Normal heart size. Nonaneurysmal thoracic aorta. Lungs are free of pulmonary consolidations. No effusion or pneumothorax. Minimal subsegmental atelectasis or linear scarring in the region of the lingula. Degenerative changes are present along the dorsal spine. Safety pin projects over the anterior aspect of the chest wall. IMPRESSION: No active cardiopulmonary disease. Electronically Signed   By: Ashley Royalty M.D.   On: 06/16/2018 19:04    Procedures Procedures (including critical care time)  Medications Ordered in ED Medications - No data to display   Initial Impression / Assessment and Plan / ED Course  Triage vital signs and the nursing notes have been reviewed.  Pertinent  labs & imaging results that were available during care of the patient were reviewed and considered in medical decision making (see chart for details).  Patient presents with normal vital signs and is well appearing. She complains of reproducible left chest and shoulder pain. This pain occurs after a day of swimming. No bony tenderness, crepitus or deformities appreciated that would warrant imaging. Patient has no accompanying symptoms of diaphoresis, N/V, SOB, cough or leg swelling that raise concern for an acute cardiac process. Will still evaluate for acute cardiac and pulmonary cause to pain.   Clinical Course as of Jun 16 2341  Fri Jun 16, 2018  2029 EKG showed NSR. No ST elevations/depressions or signs of acute ischemia or infarct. This is reassuring in combination with negative troponin which assists in evaluating and ruling out an acute cardiac process. CXR normal. Labs unremarkable.   [GM]    Clinical Course User Index [GM] Samanthan Dugo, Alvie Heidelberg I, PA-C   There are no other physical exam findings or s/s that suggest an underlying infectious or rheumatologic process that warrant further evaluation or intervention today.  Final Clinical Impressions(s) / ED Diagnoses  1. Chest Wall Pain. MSK etiology 2/2 overuse. Education provided on OTC and supportive treatment for relief. 2. Left Shoulder Pain. Education provided on OTC and supportive treatment for relief.  Dispo: Home. After thorough clinical evaluation, this patient is determined to be medically stable and can be safely discharged with the previously mentioned treatment and/or outpatient follow-up/referral(s). At this time, there are no other apparent medical conditions that require further screening, evaluation or treatment.   Final diagnoses:  Chest wall pain  Acute pain of left shoulder    ED Discharge Orders    None        Junita Push 06/16/18 2342    Fredia Sorrow, MD 06/16/18 2351

## 2018-06-16 NOTE — ED Triage Notes (Signed)
Patient to ED c/o central and L-sided CP with radiation down L arm since yesterday morning. She states it is aching and burning at times and will ease off and get worse on its own. She endorses some shortness of breath and nausea as well as indigestion. Resp e/u, skin warm/dry.

## 2018-06-16 NOTE — Discharge Instructions (Addendum)
Your cardiac work-up today was negative which is great! Your EKG, chest x-ray and lab work were normal.   You likely strained muscles in your chest (pectoralis) and shoulder after swimming. You may use Tylenol and/or Ibuprofen for pain relief and swelling. You may also use warm compresses for additional relief. You may follow-up with your PCP if you continue to have issues for more than 4-6 weeks.  Thank you for allowing me to take care of you today!

## 2018-06-19 ENCOUNTER — Ambulatory Visit: Payer: PPO

## 2018-06-20 ENCOUNTER — Other Ambulatory Visit: Payer: Self-pay

## 2018-06-20 NOTE — Patient Outreach (Addendum)
[facility]) Care Management  06/20/2018  Diana Roach 1951-07-08 [id number]   Telephone Screen  Referral Date: 06/20/18 Referral Source: [employer] [facility] Dept. Referral Reason: " member expressed concern with co pay assistance for both of her inhalers-Flovent and Albuterol" Insurance: [employer]TA   Outreach attempt # 1 to patient. Spoke with patient and screening completed.   Social: Patient resides in her home alone. She voices that she is independent with ADLs/IADLs. She denies any falls or use of assistive devices. She drives herself to medical appts.    Conditions: Per chart review and patient report she has PMH of asthma, diverticulitis and DM. Patient states that her diabetes is diet and exercise controlled. She is currently not on any meds for it. She does not monitor cbgs in the home. Last A1C on file-6.4(June 2019). Patient was in the Ed on 06/16/18 for chest wall pain and left shoulder pain. She voices that she will have to have surgery in the near future. She denies any issues or concerns managing her medical conditions.  Medications: Patient voices she is currently only taking her two inhalers and using two creams. Se denies being out of any meds. She states that she will need a refill on Flovent soon. She reports she was told MD office does not supply samples anymore.    Appointments:  Patient sees PCP regularly.    Consent: [facility] services reviewed and discussed. Verbal consent for services given by patient. She voices only needing pharmacy assistance at this time.   Plan: RN CM will send [facility] pharmacy referral for possible med assistance.    Enzo Montgomery, RN,BSN,CCM [place] Management Telephonic Care Management Coordinator Direct Phone: [phone] Toll Free: [phone] Fax: [phone]

## 2018-06-21 ENCOUNTER — Telehealth: Payer: Self-pay | Admitting: Pharmacist

## 2018-06-21 NOTE — Patient Outreach (Signed)
[facility]) Care Management  06/21/2018  TAMALYN WADSWORTH 1951-05-28 [id number]   Called patient regarding medication assistance with Flovent and Albuterol inhalers. HIPAA identifiers were obtained x 2 but the patient said she could not talk at the time of my call.  Plan: [facility] Pharmacist, Lottie Dawson, will follow up with the patient later this week as I am on PAL.   Elayne Guerin, PharmD, [place] Clinical Pharmacist [phone]

## 2018-06-23 ENCOUNTER — Ambulatory Visit: Payer: Self-pay | Admitting: Pharmacist

## 2018-06-23 ENCOUNTER — Other Ambulatory Visit: Payer: Self-pay | Admitting: Pharmacist

## 2018-06-23 NOTE — Patient Outreach (Signed)
[facility]) Care Management  06/23/2018  BRYTTNEY NETZER [birth year] [id number]  67 year old female referred to [place] Management via [facility] telephonic RN for medication assistance.  PMH significant for: Asthma, DMT2 (A1c 2.2-[facility] controlled), lichen planus.  SUBJECTIVE: Successful patient outreach call to Ms. Diana Roach with HIPAA identifiers verified.  Patient aggreable to review medications and allergies telephonically.  Patient states that she is starting to have a difficult time affording inhalers.  They are each $45/month.  She reports compliance and is tolerating these inhalers well.  She is using Flovent 1 puff twice daily with good results and rarely needs albuterol rescue inhaler.  She is currently still working part time, however is not going to be working as of next month.  She states she does not know her total monthly income, but will call back once she can find her documents.  OBJECTIVE: Medications Reviewed Today    Reviewed by Lavera Guise, [facility] (Pharmacist) on 06/23/18 at 1513  Med List Status: <None>  Medication Order Taking? Sig Documenting Provider Last Dose Status Informant  albuterol (PROAIR HFA) 108 (90 Base) MCG/ACT inhaler [id number] Yes Inhale 2 puffs into the lungs every 4 (four) hours as needed for wheezing or shortness of breath. For wheezing.  Patient taking differently:  Inhale 2 puffs into the lungs every 4 (four) hours as needed for wheezing or shortness of breath.    Virginia Crews, MD Taking Active Self  Aspirin-Acetaminophen-Caffeine (GOODY HEADACHE PO) [id number] Yes Take 1 packet by mouth as needed (for headaches or pain).  [provider] Taking Active Self  FLOVENT HFA 110 MCG/ACT inhaler [id number] Yes INHALE 1 PUFF BY MOUTH INTO THE LUNGS TWICE DAILY  Patient taking differently:  Inhale 1 puff into the lungs 2 (two) times daily.    Leeanne Rio, MD Taking Active Self  mupirocin ointment (BACTROBAN) 2 % [id number] Yes  Apply 1 application topically daily.  [provider]  Active   polyethylene glycol Merril Abbe / GLYCOLAX) packet [id number] Yes Take 17 g by mouth See admin instructions. Mix 17 grams into 4-8 ounces of water and drink one to two times a week as needed for constipation [provider] Taking Active Self  triamcinolone cream (KENALOG) 0.1 % [id number] Yes Apply 1 application topically See admin instructions. Apply to bilateral legs two to three times a day for Lichen Planus [provider] Taking Active Self  Med List Note Caryl Bis, Angela Adam, MD 02/17/15 1457):            Drugs sorted by system:  Pulmonary/Allergy: fluticasone inhaler, albuterol inhaler  Gastrointestinal: Miralax  Topical: mupirocin oint, triamcinolone cream  ASSESSMENT:  Medication assistance:  Patient unable to affordthe following medication  1.Flovent inhaler 2. Ventolin inhaler  Assistance program reviewed with patient.  -Extra Help:after review of patient's income-->patient does not qualify for LIS/extra help -PAP: -[employer] manufacturers both Flovent and Ventolin inhalers.I reviewed with the patient that PAP application will be mailed to her in an [employer] envelope and how to fill our and mail back to [facility].   PLAN: -I will route PAP letter to Etter Sjogren, [facility] pharmacy technicianwho will assist with contacting provider & patient to obtain necessary application requirements -I will obtain patient's TROOP  -I will followupwith patient in1-2 weeks to ensure PAP applications have arrived     Regina Eck, PharmD, [place]  [phone]

## 2018-06-27 ENCOUNTER — Other Ambulatory Visit: Payer: Self-pay | Admitting: Pharmacy Technician

## 2018-06-27 NOTE — Patient Outreach (Signed)
[facility]) Care Management  06/27/2018  KIMORA STANKOVIC [birth year] [id number]  Received [place] patient assistance referral from [place] for Ventolin HFA and Flovent. Prepared patient portion to be mailed and faxed provider portion to [name].  Will follow up with patient in 5-7 business days to confirm application has been received.  Maud Deed [place], [place] Management [phone]

## 2018-06-29 ENCOUNTER — Other Ambulatory Visit: Payer: Self-pay | Admitting: Family Medicine

## 2018-06-29 MED ORDER — ALBUTEROL SULFATE HFA 108 (90 BASE) MCG/ACT IN AERS: 2.0000 | INHALATION_SPRAY | Freq: Four times a day (QID) | RESPIRATORY_TRACT | 11 refills | Status: DC | PRN

## 2018-06-29 MED ORDER — FLUTICASONE PROPIONATE HFA 110 MCG/ACT IN AERO: INHALATION_SPRAY | RESPIRATORY_TRACT | 11 refills | Status: DC

## 2018-07-11 DIAGNOSIS — G5601 Carpal tunnel syndrome, right upper limb: Secondary | ICD-10-CM | POA: Diagnosis not present

## 2018-07-24 DIAGNOSIS — M79641 Pain in right hand: Secondary | ICD-10-CM | POA: Diagnosis not present

## 2018-08-02 ENCOUNTER — Other Ambulatory Visit: Payer: Self-pay | Admitting: Pharmacy Technician

## 2018-08-02 ENCOUNTER — Encounter: Payer: Self-pay | Admitting: Pharmacy Technician

## 2018-08-02 NOTE — Patient Outreach (Signed)
[facility] [facility]) Care Management  08/02/2018  Diana Roach 03-08-1951 [id number]   Successful call to Diana Roach, HIPAA identifiers verified. Diana Roach doesn't think she received [place] patient assistance application. In formed her that I would remail and that it would be coming in a [employer] envelope. Patient stated she would be on the lookout out for it. Prepared application to be mailed out.  Will follow up with patient in 5-7 business days to confirm application has been received.  Maud Deed [place], [place] Management [phone]

## 2018-08-10 ENCOUNTER — Other Ambulatory Visit: Payer: Self-pay | Admitting: Pharmacy Technician

## 2018-08-10 NOTE — Patient Outreach (Signed)
[place] [facility]) Care Management  08/10/2018  Diana Roach 1951/01/23 [id number]   Successful call to patient, HIPAA identifiers verified. [name] confirmed that she received patient assistance application for Ventolin and Flovent. She states that she has been sick and will try to mail back in today or tomorrow.  Will follow up with patient in 5-7 business days if documents have not been received.  [name], [place] Management [phone]

## 2018-08-11 ENCOUNTER — Ambulatory Visit (INDEPENDENT_AMBULATORY_CARE_PROVIDER_SITE_OTHER): Payer: PPO | Admitting: Family Medicine

## 2018-08-11 ENCOUNTER — Other Ambulatory Visit: Payer: Self-pay

## 2018-08-11 ENCOUNTER — Encounter: Payer: Self-pay | Admitting: Family Medicine

## 2018-08-11 VITALS — BP 126/72 | HR 87 | Temp 98.3°F | Ht 61.0 in | Wt 141.0 lb

## 2018-08-11 DIAGNOSIS — B9689 Other specified bacterial agents as the cause of diseases classified elsewhere: Secondary | ICD-10-CM | POA: Diagnosis not present

## 2018-08-11 DIAGNOSIS — J019 Acute sinusitis, unspecified: Secondary | ICD-10-CM

## 2018-08-11 DIAGNOSIS — E119 Type 2 diabetes mellitus without complications: Secondary | ICD-10-CM

## 2018-08-11 LAB — POCT GLYCOSYLATED HEMOGLOBIN (HGB A1C): HbA1c, POC (controlled diabetic range): 6.4 % (ref 0.0–7.0)

## 2018-08-11 MED ORDER — AMOXICILLIN-POT CLAVULANATE 875-125 MG PO TABS: 1.0000 | ORAL_TABLET | Freq: Two times a day (BID) | ORAL | 0 refills | Status: AC

## 2018-08-11 NOTE — Assessment & Plan Note (Signed)
Hemoglobin remains at 6.4 today. Will not make any changes to management.   F/u a1c in three months.

## 2018-08-11 NOTE — Assessment & Plan Note (Addendum)
Viral URI vs bacterial URI. Patient experiencing symptoms >10 days with biphasic pattern with sick contacts.   augmentin [phone] BID x 7 days   Return precautions  Honey and naproxen for cough

## 2018-08-11 NOTE — Patient Instructions (Addendum)
Dear Diana Roach,   It was nice to see you today! I am glad you came in for your concerns. This document serves as a "wrap-up" to all that we discussed today and is listed as follows:   Acute Bacterial Rhinosinusitis  Please take augmentin (antibiotic) every 12 hours for 7 days.   Please call and make an appointment if you are not feeling better within 3-5 days of antibiotic therapy.   For cough, studies have shown naproxen and honey have the greatest association with improving nighttime cough.    Health Maintenance: At your next appointment, please expect Flu shot, Tdap/tetanus, pneumococcal  Thank you for choosing [facility] for your primary care needs and stay well!   Best,   Dr. Zettie Cooley Resident Physician, PGY-1 [facility] [phone]    Don't forget to sign up for MyChart for instant access to your health profile, labs, orders, upcoming appointments or to contact your provider with questions. Stop at the front desk on the way out for more information about how to sign up! Sinus Rinse What is a sinus rinse? A sinus rinse is a simple home treatment that is used to rinse your sinuses with a sterile mixture of salt and water (saline solution). Sinuses are air-filled spaces in your skull behind the bones of your face and forehead that open into your nasal cavity. You will use the following:  Saline solution.  Neti pot or spray bottle. This releases the saline solution into your nose and through your sinuses. Neti pots and spray bottles can be purchased at [employer], a health food store, or online.  When would I do a sinus rinse? A sinus rinse can help to clear mucus, dirt, dust, or pollen from the nasal cavity. You may do a sinus rinse when you have a cold, a virus, nasal allergy symptoms, a sinus infection, or stuffiness in the nose or sinuses. If you are considering a sinus rinse:  Ask your child's health care provider before  performing a sinus rinse on your child.  Do not do a sinus rinse if you have had ear or nasal surgery, ear infection, or blocked ears.  How do I do a sinus rinse?  Wash your hands.  Disinfect your device according to the directions provided and then dry it.  Use the solution that comes with your device or one that is sold separately in stores. Follow the mixing directions on the package.  Fill your device with the amount of saline solution as directed by the device instructions.  Stand over a sink and tilt your head sideways over the sink.  Place the spout of the device in your upper nostril (the one closer to the ceiling).  Gently pour or squeeze the saline solution into the nasal cavity. The liquid should drain to the lower nostril if you are not overly congested.  Gently blow your nose. Blowing too hard may cause ear pain.  Repeat in the other nostril.  Clean and rinse your device with clean water and then air-dry it. Are there risks of a sinus rinse? Sinus rinse is generally very safe and effective. However, there are a few risks, which include:  A burning sensation in the sinuses. This may happen if you do not make the saline solution as directed. Make sure to follow all directions when making the saline solution.  Infection from contaminated water. This is rare, but possible.  Nasal irritation.  This information is not intended  to replace advice given to you by your health care provider. Make sure you discuss any questions you have with your health care provider. Document Released: 05/08/2014 Document Revised: 09/07/2016 Document Reviewed: 02/26/2014 Elsevier Interactive Patient Education  2017 Elsevier Inc.  Sinusitis, Adult Sinusitis is soreness and inflammation of your sinuses. Sinuses are hollow spaces in the bones around your face. They are located:  Around your eyes.  In the middle of your forehead.  Behind your nose.  In your cheekbones.  Your sinuses  and nasal passages are lined with a stringy fluid (mucus). Mucus normally drains out of your sinuses. When your nasal tissues get inflamed or swollen, the mucus can get trapped or blocked so air cannot flow through your sinuses. This lets bacteria, viruses, and funguses grow, and that leads to infection. Follow these instructions at home: Medicines  Take, use, or apply over-the-counter and prescription medicines only as told by your doctor. These may include nasal sprays.  If you were prescribed an antibiotic medicine, take it as told by your doctor. Do not stop taking the antibiotic even if you start to feel better. Hydrate and Humidify  Drink enough water to keep your pee (urine) clear or pale yellow.  Use a cool mist humidifier to keep the humidity level in your home above 50%.  Breathe in steam for 10-15 minutes, 3-4 times a day or as told by your doctor. You can do this in the bathroom while a hot shower is running.  Try not to spend time in cool or dry air. Rest  Rest as much as possible.  Sleep with your head raised (elevated).  Make sure to get enough sleep each night. General instructions  Put a warm, moist washcloth on your face 3-4 times a day or as told by your doctor. This will help with discomfort.  Wash your hands often with soap and water. If there is no soap and water, use hand sanitizer.  Do not smoke. Avoid being around people who are smoking (secondhand smoke).  Keep all follow-up visits as told by your doctor. This is important. Contact a doctor if:  You have a fever.  Your symptoms get worse.  Your symptoms do not get better within 10 days. Get help right away if:  You have a very bad headache.  You cannot stop throwing up (vomiting).  You have pain or swelling around your face or eyes.  You have trouble seeing.  You feel confused.  Your neck is stiff.  You have trouble breathing. This information is not intended to replace advice given to  you by your health care provider. Make sure you discuss any questions you have with your health care provider. Document Released: 03/29/2008 Document Revised: 06/06/2016 Document Reviewed: 08/06/2015 Elsevier Interactive Patient Education  [employer].

## 2018-08-11 NOTE — Progress Notes (Signed)
SUBJECTIVE:  PCP: Leeanne Rio, MD Patient ID: MRN [id number]  Date of birth: 1951/07/10  HPI Diana Roach is a 67 y.o. female who presents to clinic with chief complaint of URI.   #Coughing  Chest congestion, sinus congestion, nasal congestion. Started coughing up green phlegm, was previously white. Started on October 7th, day 11. Monday started, Wednesday laying in bed all day. Patient reports feeling the worst on day 3-4. Patient has had subjective fever and chills that started on day 4-5 that have improved. She denies chills currently. Congestion, sore throat and has been plateuaed since. Feels that she has no energy. Has not tried OTC medicines, only cough drops.  Patient is using humidifer at home that she believes is helping. Patient also has asthma and uses flovent daily. Has not had to use her albuterol for SOB. Patient started methylprednisilone taper on 08/09/2018 for carpal tunnel.  Of note, sick contacts include daughter had two weeks of symptoms and then started on abx which improved symptoms. Grand children had croup and others in the house have viral URI.   Review of Symptoms: See HPI  HISTORY Medications & Allergies: Reviewed with patient and updated in EMR as appropriate.   PMHx:  Patient Active Problem List   Diagnosis Date Noted  . Acute bacterial rhinosinusitis 08/11/2018  . Type 2 diabetes mellitus without complication, without long-term current use of insulin ([place]) 10/24/2017  . Plantar fasciitis 07/29/2017  . Prediabetes 07/29/2017  . Varicose vein of leg 03/09/2016  . UPJ obstruction, congenital 01/28/2016  . Lumbosacral radiculopathy 01/27/2016  . Acute sinusitis 10/30/2015  . Diverticulosis 10/01/2014  . Radiculopathy, cervical 09/04/2014  . Bilateral finger numbness 09/04/2014  . Abdominal pain 04/24/2014  . Rotator cuff tendinitis 07/13/2013  . Asthma, moderate persistent 05/13/2011  . Lichen planus 39/12/90  . 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 Rash and nonspecific skin eruption 12/15/2010  . HYPERLIPIDEMIA 12/22/2006   SHx  reports that she quit smoking about 23 years ago. She has never used smokeless tobacco. She reports that she does not drink alcohol or use drugs.  OBJECTIVE:  BP 126/72   Pulse 87   Temp 98.3 F (36.8 C) (Oral)   Ht 5\' 1"  (1.549 m)   Wt 141 lb (64 kg)   SpO2 98%   BMI 26.64 kg/m   Physical Exam:  Gen: NAD, alert, non-toxic, well-appearing, sitting comfortably  Skin: Warm and dry. No obvious rashes, lesions, or trauma. HEENT: NCAT. PERRLA. No conjunctival pallor or injection. No scleral icterus or injection.  Oropharynx clear and without exudates. MMM. Bilateral nasal turbinates swollen. No maxillary sinus tenderness.  CV: RRR.  Normal S1-S2.  RP & DPs 2+ bilaterally. No BLEE. Resp: CTAB.  No wheezing, rales, rhonchi.  No increased WOB. Persistent productive cough throughout exam. Extremities: Moves all extremities spontaneously  Neuro: CN II-XII grossly intact. No FNDs.   Pertinent Labs & Imaging:  Reviewed in chart   ASSESSMENT & PLAN:  Acute bacterial rhinosinusitis Viral URI vs bacterial URI. Patient experiencing symptoms >10 days with biphasic pattern with sick contacts.   augmentin 875-125 BID x 7 days   Return precautions  Honey and naproxen for cough   Type 2 diabetes mellitus without complication, without long-term current use of insulin (HCC) Hemoglobin remains at 6.4 today. Will not make any changes to management.   F/u a1c in three months.  Patient will address vx at next visit:  Health Maintenance Due  Topic Date Due  .  INFLUENZA VACCINE  05/25/2018  . TETANUS/TDAP  06/14/2018  . PNA vac Low Risk Adult (2 of 2 - PPSV23) 07/29/2018     Zettie Cooley, M.D. [place]  PGY -1 08/11/2018, 4:18 PM

## 2018-08-14 ENCOUNTER — Ambulatory Visit: Payer: PPO

## 2018-08-17 ENCOUNTER — Ambulatory Visit: Payer: Self-pay | Admitting: Pharmacy Technician

## 2018-08-22 ENCOUNTER — Encounter: Payer: Self-pay | Admitting: Pharmacy Technician

## 2018-08-24 ENCOUNTER — Other Ambulatory Visit: Payer: Self-pay | Admitting: Pharmacist

## 2018-08-24 ENCOUNTER — Ambulatory Visit: Payer: Self-pay | Admitting: Pharmacist

## 2018-08-24 NOTE — Patient Outreach (Addendum)
[facility]) Care Management [place]  08/24/2018  KASYN STOUFFER 1950/12/04 [id number]  Reason for referral: medication assistance   [facility] pharmacy case is being closed due to the following reasons:  -Patient will not meet out of pocket spending limit for [employer] this year based on documents obtained -Will place patient on PAP list next year (2020) for [employer] (Proventil and potentially Ruthe Mannan (if MD and patient agree-patient is only on Flovent and do not want to increase potential adverse effects from LABA/ICS when controlled on ICS monotherapy)    Patient has been provided [facility] CM contact information if assistance needed in the future.    Thank you for allowing [facility] pharmacy to be involved in this patient's care.     Regina Eck, PharmD, [place]  [phone]

## 2018-08-30 ENCOUNTER — Ambulatory Visit: Payer: Self-pay | Admitting: Pharmacist

## 2018-09-01 ENCOUNTER — Other Ambulatory Visit: Payer: Self-pay | Admitting: Pharmacist

## 2018-09-01 ENCOUNTER — Ambulatory Visit: Payer: Self-pay | Admitting: Pharmacist

## 2018-09-01 NOTE — Patient Outreach (Signed)
[facility]) Care Management  [place]   09/01/2018  Diana Roach [birth year] [id number]  Reason for referral: medication assistance  Referral source: medication assistance for 2020 Referral medication(s): Proventil Current insurance:HTA  HPI: 1 yoF recently denied by [employer] due to OOP minimums not met.  Will attempt  [employer] PAP process (Proventil and potentially Asmanex HFA) for 2002 due to no OOP requirements.  Patient in agreement and verbalizes understanding.  [employer] manufacturers Proventil (albuterol) and Asmanex HFA (mometasone) which would be the drug class equivalents to her current asthma therapy.  In-vitro studies show that mometasone is similar in potency to fluticasone and more potent than budesonide, beclomethasone, and triamcinolone.  Will route medication change request to patient's PCP to see if switch is a possibility.  Medication Assistance Findings:  Extra Help:   '[]'  Already receiving Full Extra Help  '[]'  Already receiving Partial Extra Help  '[x]'  Eligible based on reported income and assets (patient will need to call back to explore this process or go down to SS office)  '[]'  Not Eligible based on reported income and assets  Patient Assistance Programs: 1) Proventil made by [employer] o Income requirement met: '[x]'  Yes '[]'  No '[]'  Unknown o Out-of-pocket prescription expenditure met:    '[]'  Yes '[]'  No  '[]'  Unknown  '[x]'  Not applicable -Requires no OOP-->will apply for [employer] PAP for 2020  2) Asmanex HFA made by [employer] o Income requirement met: '[x]'  Yes '[]'  No '[]'  Unknown o Out-of-pocket prescription expenditure met:    '[]'  Yes '[]'  No  '[]'  Unknown  '[x]'  Not applicable -Requires no OOP-->will apply for [employer] PAP for 2020 Plan: -When patient calls back next week-->I will route patient assistance letter to [place] technician who will coordinate patient assistance program application process for medications listed above.  [facility] pharmacy technician will assist with  obtaining all required documents from both patient and provider(s) and submit application(s) once completed.   -Patient and MD to decide if switching from Flovent and Asmanex will be appropriate for her -Patient to call [facility] Pharmacist back when ready to apply for LIS/extra help next week  Regina Eck, PharmD, [place]  [phone]

## 2018-09-12 ENCOUNTER — Ambulatory Visit (INDEPENDENT_AMBULATORY_CARE_PROVIDER_SITE_OTHER): Payer: PPO

## 2018-09-12 VITALS — BP 140/84 | HR 67 | Temp 98.2°F | Ht 61.0 in | Wt 142.6 lb

## 2018-09-12 DIAGNOSIS — Z0000 Encounter for general adult medical examination without abnormal findings: Secondary | ICD-10-CM

## 2018-09-12 DIAGNOSIS — Z23 Encounter for immunization: Secondary | ICD-10-CM

## 2018-09-12 NOTE — Patient Instructions (Addendum)
Diana Roach , Thank you for taking time to come for your Medicare Wellness Visit. I appreciate your ongoing commitment to your health goals. Please review the following plan we discussed and let me know if I can assist you in the future.   Today you received a flu vaccine and a pneumonia vaccine.  These are the goals we discussed: Goals    . Patient Stated     Exercise more; be less sedentary. Go to gym more.        This is a list of the screening recommended for you and due dates:  Health Maintenance  Topic Date Due  . Eye exam for diabetics  01/24/2019*  . Tetanus Vaccine  09/13/2019*  . Complete foot exam   10/20/2018  . Hemoglobin A1C  02/10/2019  . Urine Protein Check  04/01/2019  . Mammogram  01/27/2020  . Colon Cancer Screening  11/13/2023  . Flu Shot  Completed  . DEXA scan (bone density measurement)  Completed  .  Hepatitis C: One time screening is recommended by Center for Disease Control  (CDC) for  adults born from [birth year] through 1965.   Completed  . Pneumonia vaccines  Completed  *Topic was postponed. The date shown is not the original due date.   Health Maintenance, Female Adopting a healthy lifestyle and getting preventive care can go a long way to promote health and wellness. Talk with your health care provider about what schedule of regular examinations is right for you. This is a good chance for you to check in with your provider about disease prevention and staying healthy. In between checkups, there are plenty of things you can do on your own. Experts have done a lot of research about which lifestyle changes and preventive measures are most likely to keep you healthy. Ask your health care provider for more information. Weight and diet Eat a healthy diet  Be sure to include plenty of vegetables, fruits, low-fat dairy products, and lean protein.  Do not eat a lot of foods high in solid fats, added sugars, or salt.  Get regular exercise. This is one of the most  important things you can do for your health. ? Most adults should exercise for at least 150 minutes each week. The exercise should increase your heart rate and make you sweat (moderate-intensity exercise). ? Most adults should also do strengthening exercises at least twice a week. This is in addition to the moderate-intensity exercise.  Maintain a healthy weight  Body mass index (BMI) is a measurement that can be used to identify possible weight problems. It estimates body fat based on height and weight. Your health care provider can help determine your BMI and help you achieve or maintain a healthy weight.  For females 67 years of age and older: ? A BMI below 18.5 is considered underweight. ? A BMI of 18.5 to 24.9 is normal. ? A BMI of 25 to 29.9 is considered overweight. ? A BMI of 30 and above is considered obese.  Watch levels of cholesterol and blood lipids  You should start having your blood tested for lipids and cholesterol at 67 years of age, then have this test every 5 years.  You may need to have your cholesterol levels checked more often if: ? Your lipid or cholesterol levels are high. ? You are older than 67 years of age. ? You are at high risk for heart disease.  Cancer screening Lung Cancer  Lung cancer screening is recommended for  adults 84-75 years old who are at high risk for lung cancer because of a history of smoking.  A yearly low-dose CT scan of the lungs is recommended for people who: ? Currently smoke. ? Have quit within the past 15 years. ? Have at least a 30-pack-year history of smoking. A pack year is smoking an average of one pack of cigarettes a day for 1 year.  Yearly screening should continue until it has been 15 years since you quit.  Yearly screening should stop if you develop a health problem that would prevent you from having lung cancer treatment.  Breast Cancer  Practice breast self-awareness. This means understanding how your breasts  normally appear and feel.  It also means doing regular breast self-exams. Let your health care provider know about any changes, no matter how small.  If you are in your 20s or 30s, you should have a clinical breast exam (CBE) by a health care provider every 1-3 years as part of a regular health exam.  If you are 33 or older, have a CBE every year. Also consider having a breast X-ray (mammogram) every year.  If you have a family history of breast cancer, talk to your health care provider about genetic screening.  If you are at high risk for breast cancer, talk to your health care provider about having an MRI and a mammogram every year.  Breast cancer gene (BRCA) assessment is recommended for women who have family members with BRCA-related cancers. BRCA-related cancers include: ? Breast. ? Ovarian. ? Tubal. ? Peritoneal cancers.  Results of the assessment will determine the need for genetic counseling and BRCA1 and BRCA2 testing.  Cervical Cancer Your health care provider may recommend that you be screened regularly for cancer of the pelvic organs (ovaries, uterus, and vagina). This screening involves a pelvic examination, including checking for microscopic changes to the surface of your cervix (Pap test). You may be encouraged to have this screening done every 3 years, beginning at age 56.  For women ages 62-65, health care providers may recommend pelvic exams and Pap testing every 3 years, or they may recommend the Pap and pelvic exam, combined with testing for human papilloma virus (HPV), every 5 years. Some types of HPV increase your risk of cervical cancer. Testing for HPV may also be done on women of any age with unclear Pap test results.  Other health care providers may not recommend any screening for nonpregnant women who are considered low risk for pelvic cancer and who do not have symptoms. Ask your health care provider if a screening pelvic exam is right for you.  If you have had  past treatment for cervical cancer or a condition that could lead to cancer, you need Pap tests and screening for cancer for at least 20 years after your treatment. If Pap tests have been discontinued, your risk factors (such as having a new sexual partner) need to be reassessed to determine if screening should resume. Some women have medical problems that increase the chance of getting cervical cancer. In these cases, your health care provider may recommend more frequent screening and Pap tests.  Colorectal Cancer  This type of cancer can be detected and often prevented.  Routine colorectal cancer screening usually begins at 67 years of age and continues through 67 years of age.  Your health care provider may recommend screening at an earlier age if you have risk factors for colon cancer.  Your health care provider may also recommend  using home test kits to check for hidden blood in the stool.  A small camera at the end of a tube can be used to examine your colon directly (sigmoidoscopy or colonoscopy). This is done to check for the earliest forms of colorectal cancer.  Routine screening usually begins at age 80.  Direct examination of the colon should be repeated every 5-10 years through 67 years of age. However, you may need to be screened more often if early forms of precancerous polyps or small growths are found.  Skin Cancer  Check your skin from head to toe regularly.  Tell your health care provider about any new moles or changes in moles, especially if there is a change in a mole's shape or color.  Also tell your health care provider if you have a mole that is larger than the size of a pencil eraser.  Always use sunscreen. Apply sunscreen liberally and repeatedly throughout the day.  Protect yourself by wearing long sleeves, pants, a wide-brimmed hat, and sunglasses whenever you are outside.  Heart disease, diabetes, and high blood pressure  High blood pressure causes heart  disease and increases the risk of stroke. High blood pressure is more likely to develop in: ? People who have blood pressure in the high end of the normal range (130-139/85-89 mm Hg). ? People who are overweight or obese. ? People who are African American.  If you are 97-38 years of age, have your blood pressure checked every 3-5 years. If you are 68 years of age or older, have your blood pressure checked every year. You should have your blood pressure measured twice-once when you are at a hospital or clinic, and once when you are not at a hospital or clinic. Record the average of the two measurements. To check your blood pressure when you are not at a hospital or clinic, you can use: ? An automated blood pressure machine at a pharmacy. ? A home blood pressure monitor.  If you are between 74 years and 58 years old, ask your health care provider if you should take aspirin to prevent strokes.  Have regular diabetes screenings. This involves taking a blood sample to check your fasting blood sugar level. ? If you are at a normal weight and have a low risk for diabetes, have this test once every three years after 68 years of age. ? If you are overweight and have a high risk for diabetes, consider being tested at a younger age or more often. Preventing infection Hepatitis B  If you have a higher risk for hepatitis B, you should be screened for this virus. You are considered at high risk for hepatitis B if: ? You were born in a country where hepatitis B is common. Ask your health care provider which countries are considered high risk. ? Your parents were born in a high-risk country, and you have not been immunized against hepatitis B (hepatitis B vaccine). ? You have HIV or AIDS. ? You use needles to inject street drugs. ? You live with someone who has hepatitis B. ? You have had sex with someone who has hepatitis B. ? You get hemodialysis treatment. ? You take certain medicines for conditions,  including cancer, organ transplantation, and autoimmune conditions.  Hepatitis C  Blood testing is recommended for: ? Everyone born from [birth year] through 1965. ? Anyone with known risk factors for hepatitis C.  Sexually transmitted infections (STIs)  You should be screened for sexually transmitted infections (STIs) including gonorrhea  and chlamydia if: ? You are sexually active and are younger than 67 years of age. ? You are older than 67 years of age and your health care provider tells you that you are at risk for this type of infection. ? Your sexual activity has changed since you were last screened and you are at an increased risk for chlamydia or gonorrhea. Ask your health care provider if you are at risk.  If you do not have HIV, but are at risk, it may be recommended that you take a prescription medicine daily to prevent HIV infection. This is called pre-exposure prophylaxis (PrEP). You are considered at risk if: ? You are sexually active and do not regularly use condoms or know the HIV status of your partner(s). ? You take drugs by injection. ? You are sexually active with a partner who has HIV.  Talk with your health care provider about whether you are at high risk of being infected with HIV. If you choose to begin PrEP, you should first be tested for HIV. You should then be tested every 3 months for as long as you are taking PrEP. Pregnancy  If you are premenopausal and you may become pregnant, ask your health care provider about preconception counseling.  If you may become pregnant, take 400 to 800 micrograms (mcg) of folic acid every day.  If you want to prevent pregnancy, talk to your health care provider about birth control (contraception). Osteoporosis and menopause  Osteoporosis is a disease in which the bones lose minerals and strength with aging. This can result in serious bone fractures. Your risk for osteoporosis can be identified using a bone density scan.  If you are  23 years of age or older, or if you are at risk for osteoporosis and fractures, ask your health care provider if you should be screened.  Ask your health care provider whether you should take a calcium or vitamin D supplement to lower your risk for osteoporosis.  Menopause may have certain physical symptoms and risks.  Hormone replacement therapy may reduce some of these symptoms and risks. Talk to your health care provider about whether hormone replacement therapy is right for you. Follow these instructions at home:  Schedule regular health, dental, and eye exams.  Stay current with your immunizations.  Do not use any tobacco products including cigarettes, chewing tobacco, or electronic cigarettes.  If you are pregnant, do not drink alcohol.  If you are breastfeeding, limit how much and how often you drink alcohol.  Limit alcohol intake to no more than 1 drink per day for nonpregnant women. One drink equals 12 ounces of beer, 5 ounces of wine, or 1 ounces of hard liquor.  Do not use street drugs.  Do not share needles.  Ask your health care provider for help if you need support or information about quitting drugs.  Tell your health care provider if you often feel depressed.  Tell your health care provider if you have ever been abused or do not feel safe at home. This information is not intended to replace advice given to you by your health care provider. Make sure you discuss any questions you have with your health care provider. Document Released: 04/26/2011 Document Revised: 03/18/2016 Document Reviewed: 07/15/2015 Elsevier Interactive Patient Education  [employer].

## 2018-09-12 NOTE — Progress Notes (Signed)
Subjective:   Diana Roach is a 67 y.o. female who presents for Medicare Annual (Subsequent) preventive examination.  Review of Systems:  Physical assessment deferred to PCP.  Cardiac Risk Factors include: advanced age (>74men, >71 women);diabetes mellitus;sedentary lifestyle     Objective:    Vitals: BP 140/84   Pulse 67   Temp 98.2 F (36.8 C) (Oral)   Ht 5\' 1"  (1.549 m)   Wt 142 lb 9.6 oz (64.7 kg)   SpO2 98%   BMI 26.94 kg/m   Body mass index is 26.94 kg/m.  Advanced Directives 09/12/2018 06/20/2018 06/16/2018 10/20/2017 07/29/2017 06/16/2017 02/14/2017  Does Patient Have a Medical Advance Directive? No No No No No No No  Would patient like information on creating a medical advance directive? Yes (MAU/Ambulatory/Procedural Areas - Information given) No - Patient declined No - Patient declined No - Patient declined No - Patient declined No - Patient declined No - Patient declined    Tobacco Social History   Tobacco Use  Smoking Status Former Smoker  . Last attempt to quit: 10/25/1994  . Years since quitting: 23.8  Smokeless Tobacco Never Used  Tobacco Comment   no plans to start again     Counseling given: No Comment: no plans to start again   Clinical Intake:  Pre-visit preparation completed: Yes  Pain : 0-10 Pain Score: 3  Pain Type: Chronic pain Pain Location: Shoulder Pain Orientation: Left Pain Onset: More than a month ago Pain Frequency: Intermittent     Nutritional Status: BMI 25 -29 Overweight Nutritional Risks: None Diabetes: Yes CBG done?: No Did pt. bring in CBG monitor from home?: No  How often do you need to have someone help you when you read instructions, pamphlets, or other written materials from your doctor or pharmacy?: 1 - Never What is the last grade level you completed in school?: 9th grade  Interpreter Needed?: No     Past Medical History:  Diagnosis Date  . Asthma   . Cataract   . Diabetes mellitus without  complication ([place])   . Diverticulitis   . Environmental allergies   . Lichen planus    Past Surgical History:  Procedure Laterality Date  . APPENDECTOMY  1969  . BACK SURGERY  1986   lumbar spine  . CARPAL TUNNEL RELEASE Bilateral   . COLONOSCOPY  2015   Family History  Problem Relation Age of Onset  . Hypothyroidism Sister   . Cervical cancer Sister   . Fibromyalgia Sister   . Heart disease Mother   . Hypertension Brother   . Lichen planus Brother   . Cancer Brother        lung  . Heart attack Brother   . Diabetes Daughter   . Cancer Daughter   . Colon cancer Neg Hx   . Pancreatic cancer Neg Hx   . Stomach cancer Neg Hx   . Esophageal cancer Neg Hx   . Breast cancer Neg Hx    Social History   Socioeconomic History  . Marital status: Widowed    Spouse name: Not on file  . Number of children: 1  . Years of education: 79  . Highest education level: 9th grade  Occupational History  . Occupation: retired    Fish farm manager: [employer]    Comment: cleaner  Social Needs  . Financial resource strain: Somewhat hard  . Food insecurity:    Worry: Never true    Inability: Never true  . Transportation needs:  Medical: No    Non-medical: No  Tobacco Use  . Smoking status: Former Smoker    Last attempt to quit: 10/25/1994    Years since quitting: 23.8  . Smokeless tobacco: Never Used  . Tobacco comment: no plans to start again  Substance and Sexual Activity  . Alcohol use: No  . Drug use: No  . Sexual activity: Not Currently  Lifestyle  . Physical activity:    Days per week: 0 days    Minutes per session: 0 min  . Stress: Not at all  Relationships  . Social connections:    Talks on phone: More than three times a week    Gets together: More than three times a week    Attends religious service: More than 4 times per year    Active member of club or organization: No    Attends meetings of clubs or organizations: Never    Relationship status: Widowed  Other  Topics Concern  . Not on file  Social History Narrative   Lives with daughter, son-in-law, grandson   House in one level house; has stairs to get into house with handrails, grab bars not present, smoke alarms present; throw rugs with backing   Has dog named Willow, chilhuahua/fox terrier   Likes to eat variety, meats, vegetables, fruits   Caffeine- sodas (Pepsi), water, tea, occasional orange juice   Wears seatbelt, wears sunblock      Patient likes to swim, go to beach, camping, shopping    Outpatient Encounter Medications as of 09/12/2018  Medication Sig  . albuterol (VENTOLIN HFA) 108 (90 Base) MCG/ACT inhaler Inhale 2 puffs into the lungs every 6 (six) hours as needed for wheezing or shortness of breath.  . Aspirin-Acetaminophen-Caffeine (GOODY HEADACHE PO) Take 1 packet by mouth as needed (for headaches or pain).   . fluticasone (FLOVENT HFA) 110 MCG/ACT inhaler INHALE 1 PUFF BY MOUTH INTO THE LUNGS TWICE DAILY  . mupirocin ointment (BACTROBAN) 2 % Apply 1 application topically daily.   . polyethylene glycol (MIRALAX / GLYCOLAX) packet Take 17 g by mouth See admin instructions. Mix 17 grams into 4-8 ounces of water and drink one to two times a week as needed for constipation  . triamcinolone cream (KENALOG) 0.1 % Apply 1 application topically See admin instructions. Apply to bilateral legs two to three times a day for Lichen Planus   No facility-administered encounter medications on file as of 09/12/2018.     Activities of Daily Living In your present state of health, do you have any difficulty performing the following activities: 09/12/2018  Hearing? Y  Comment Has hearing loss; hearing aids are too expensive  Vision? Y  Comment Has cataracts; next appt in january  Difficulty concentrating or making decisions? N  Walking or climbing stairs? Y  Comment some SOB when climbing stairs  Dressing or bathing? N  Doing errands, shopping? N  Preparing Food and eating ? N  Using the  Toilet? N  In the past six months, have you accidently leaked urine? N  Do you have problems with loss of bowel control? N  Managing your Medications? N  Managing your Finances? N  Housekeeping or managing your Housekeeping? N  Some recent data might be hidden    Patient Care Team: Leeanne Rio, MD as PCP - General (Pediatrics) Blanca Friend Royce Macadamia, [facility] as [place] [facility] (Pharmacist) Roseanne Kaufman, MD as Consulting Physician (Orthopedic Surgery) Carol Ada, MD as Consulting Physician (Gastroenterology) Calvert Cantor,  MD as Consulting Physician (Ophthalmology)    Assessment:   This is a routine wellness examination for Asher.  Exercise Activities and Dietary recommendations Current Exercise Habits: The patient does not participate in regular exercise at present, Exercise limited by: respiratory conditions(s)  Goals    . Patient Stated     Exercise more; be less sedentary. Go to gym more.        Fall Risk Fall Risk  09/12/2018 08/11/2018 06/20/2018 10/20/2017 08/08/2017  Falls in the past year? 0 No No No No   Is the patient's home free of loose throw rugs in walkways, pet beds, electrical cords, etc?   yes      Grab bars in the bathroom? no      Handrails on the stairs?   yes      Adequate lighting?   yes   Depression Screen PHQ 2/9 Scores 09/12/2018 08/11/2018 06/20/2018 10/20/2017  PHQ - 2 Score 0 0 0 0  PHQ- 9 Score - - - -     Cognitive Function MMSE - Mini Mental State Exam 09/12/2018  Orientation to time 5  Orientation to Place 5  Registration 3  Attention/ Calculation 5  Recall 3  Language- name 2 objects 2  Language- repeat 1  Language- follow 3 step command 3  Language- read & follow direction 1  Write a sentence 1  Copy design 1  Total score 30     6CIT Screen 09/12/2018  What Year? 0 points  What month? 0 points  What time? 0 points  Count back from 20 0 points  Months in reverse 0 points  Repeat phrase 0  points  Total Score 0    Immunization History  Administered Date(s) Administered  . Influenza Split 11/03/2012  . Influenza,inj,Quad PF,6+ Mos 09/24/2013, 09/04/2014, 08/11/2016, 07/29/2017, 09/12/2018  . Pneumococcal Conjugate-13 07/29/2017  . Pneumococcal Polysaccharide-23 09/12/2018  . Td 06/14/2008     Screening Tests Health Maintenance  Topic Date Due  . OPHTHALMOLOGY EXAM  01/24/2019 (Originally 11/15/2017)  . TETANUS/TDAP  09/13/2019 (Originally 06/14/2018)  . FOOT EXAM  10/20/2018  . HEMOGLOBIN A1C  02/10/2019  . URINE MICROALBUMIN  04/01/2019  . MAMMOGRAM  01/27/2020  . COLONOSCOPY  11/13/2023  . INFLUENZA VACCINE  Completed  . DEXA SCAN  Completed  . Hepatitis C Screening  Completed  . PNA vac Low Risk Adult  Completed    Cancer Screenings: Lung: Low Dose CT Chest recommended if Age 59-80 years, 30 pack-year currently smoking OR have quit w/in 15years. Patient does not qualify. Breast:  Up to date on Mammogram? Yes   Up to date of Bone Density/Dexa? Yes Colorectal: up to date  Additional Screenings: : Hepatitis C Screening: complete     Plan:  Gave flu vaccine and Pneumovax today. All health maintenance items up to date.    I have personally reviewed and noted the following in the patient's chart:   . Medical and social history . Use of alcohol, tobacco or illicit drugs  . Current medications and supplements . Functional ability and status . Nutritional status . Physical activity . Advanced directives . List of other physicians . Hospitalizations, surgeries, and ER visits in previous 12 months . Vitals . Screenings to include cognitive, depression, and falls . Referrals and appointments  In addition, I have reviewed and discussed with patient certain preventive protocols, quality metrics, and best practice recommendations. A written personalized care plan for preventive services as well as general preventive health recommendations were  provided to  patient.     Esau Grew, RN  09/12/2018

## 2018-09-13 NOTE — Progress Notes (Signed)
Agree with documentation. Diana Rio, MD

## 2018-10-11 DIAGNOSIS — M7542 Impingement syndrome of left shoulder: Secondary | ICD-10-CM | POA: Diagnosis not present

## 2018-10-11 DIAGNOSIS — M47812 Spondylosis without myelopathy or radiculopathy, cervical region: Secondary | ICD-10-CM | POA: Diagnosis not present

## 2018-10-11 DIAGNOSIS — G5601 Carpal tunnel syndrome, right upper limb: Secondary | ICD-10-CM | POA: Diagnosis not present

## 2018-10-25 HISTORY — PX: CATARACT EXTRACTION, BILATERAL: SHX1313

## 2018-10-26 ENCOUNTER — Ambulatory Visit: Payer: Self-pay | Admitting: Pharmacist

## 2018-10-30 ENCOUNTER — Ambulatory Visit: Payer: Self-pay | Admitting: Pharmacist

## 2018-10-31 ENCOUNTER — Ambulatory Visit: Payer: Self-pay | Admitting: Pharmacist

## 2018-10-31 ENCOUNTER — Other Ambulatory Visit: Payer: Self-pay | Admitting: Pharmacist

## 2018-10-31 NOTE — Patient Outreach (Signed)
[facility]) Care Management  [place]   11/03/2018  Diana Roach Aug 26, 1951 [id number]  Reason for referral: Medication Assistance with inhalers  Current insurance:[employer]  PMHx includes but not limited to:  Asthma, T2DM (A1c 6.4-not on medication)  Outreach:  Successful telephone call with Ms. Belford.  HIPAA identifiers verified.  Patient states she is doing well and her asthma continues to be controlled on maintenance Flovent inhaler with rescue Albuterol.  She continues to use inhalers as prescribed.  She states she has trouble affording medications (Flovent, Albuterol $45 copays each).  She is okay with switching to therapeutic alternatives via patient assistance programs.  She states she has received both the flu and pneumococcal vaccines this past year.  She has no other concerns about her other medications at this time.   Objective: Lab Results  Component Value Date   CREATININE 1.00 06/16/2018   CREATININE 0.85 10/20/2017   CREATININE 0.87 06/16/2017    Lab Results  Component Value Date   HGBA1C 6.4 08/11/2018    Lipid Panel     Component Value Date/Time   CHOL 188 10/20/2017 0953   TRIG 143 10/20/2017 0953   HDL 53 10/20/2017 0953   CHOLHDL 3.5 10/20/2017 0953   CHOLHDL 4.0 10/15/2014 0835   VLDL 26 10/15/2014 0835   LDLCALC 106 (H) 10/20/2017 0953    BP Readings from Last 3 Encounters:  09/12/18 140/84  08/11/18 126/72  06/16/18 122/78    Allergies  Allergen Reactions  . Codeine Itching and Hives  . Minocycline Other (See Comments)    Dizziness , "strange feeling"  . Contrast Media [Iodinated Diagnostic Agents] Other (See Comments)    BP dropped  . Levaquin [Levofloxacin] Other (See Comments)    Hallucinations     Medications Reviewed Today    Reviewed by Esau Grew, RN (Registered Nurse) on 09/12/18 at 42  Med List Status: <None>  Medication Order Taking? Sig Documenting Provider Last Dose  Status Informant  albuterol (VENTOLIN HFA) 108 (90 Base) MCG/ACT inhaler [id number] Yes Inhale 2 puffs into the lungs every 6 (six) hours as needed for wheezing or shortness of breath. Lind Covert, MD Taking Active   Aspirin-Acetaminophen-Caffeine (GOODY HEADACHE PO) [id number] Yes Take 1 packet by mouth as needed (for headaches or pain).  [provider] Taking Active Self  fluticasone (FLOVENT HFA) 110 MCG/ACT inhaler [id number] Yes INHALE 1 PUFF BY MOUTH INTO THE LUNGS TWICE DAILY Chambliss, Jeb Levering, MD Taking Active   mupirocin ointment (BACTROBAN) 2 % [id number] Yes Apply 1 application topically daily.  [provider] Taking Active   polyethylene glycol Merril Abbe / GLYCOLAX) packet [id number] Yes Take 17 g by mouth See admin instructions. Mix 17 grams into 4-8 ounces of water and drink one to two times a week as needed for constipation [provider] Taking Active Self  triamcinolone cream (KENALOG) 0.1 % [id number] Yes Apply 1 application topically See admin instructions. Apply to bilateral legs two to three times a day for Lichen Planus [provider] Taking Active Self  Med List Note Caryl Bis, Angela Adam, MD 02/17/15 1457):             Assessment:  Drugs sorted by system:  Pulmonary/Allergy: Flovent, Albuterol  Gastrointestinal: Miralax  Topical: Mupirocin  Medication Assistance Findings:  Medication assistance needs identified.   Extra Help:   _0  Already receiving Full Extra Help  _1  Already receiving Partial Extra Help  _2  Eligible based on reported  income and assets  _0  Not Eligible based on reported income and assets  Patient Assistance Programs: 1)  Asmanex made by [employer] o Income requirement met: _1  Yes _2  No _3  Unknown o Out-of-pocket prescription expenditure met:    _4  Yes _5  No  _6  Unknown  <GAYGEFUWTKTCCEQF>_3<\/VOUZHQUIQNVVYXAJ>_5  Not applicable - Patient has met application requirements to apply for this patient assistance program.          2)   Proventil made by [employer] o Income requirement met: _8  Yes _9  No  _10  Unknown o Out-of-pocket prescription expenditure met:   _11  Yes _12  No   _13  Unknown <UNGBMBOMQTTCNGFR>_4<\/VQWQVLDKCCQFJUVQ>_22  Not applicable - Patient has met application requirements to apply for this patient assistance program.    Plan: Assisted patient with applying for Extra Help. , I will route patient assistance letter to [place] technician who will coordinate patient assistance program application process for medications listed above.  [facility] pharmacy technician will assist with obtaining all required documents from both patient and provider(s) and submit application(s) once completed. Will message PCP for approval to switch to therapeutic alternative.   Regina Eck, PharmD, [place]  [phone]

## 2018-11-01 ENCOUNTER — Other Ambulatory Visit: Payer: Self-pay | Admitting: Pharmacy Technician

## 2018-11-01 NOTE — Patient Outreach (Signed)
[facility]) Care Management  11/01/2018  NIKKOL PAI 1951-04-18 [id number]                                                  Medication Assistance Referral  Referral From: [facility] RPh Jenne Pane.  Medication/Company: Asmanex and Proventil HFA / [employer] Patient application portion:  Education officer, museum portion: Interoffice Mailed to [name]  Follow up:  Will follow up with patient in 5-7 business days to confirm application(s) have been received.  Maud Deed Chana Bode [place] Certified Pharmacy Technician [place] Management Direct Dial:[phone]

## 2018-11-10 ENCOUNTER — Telehealth: Payer: Self-pay | Admitting: Family Medicine

## 2018-11-10 MED ORDER — MOMETASONE FUROATE 100 MCG/ACT IN AERO: 1.0000 | INHALATION_SPRAY | Freq: Two times a day (BID) | RESPIRATORY_TRACT | 11 refills | Status: DC

## 2018-11-10 MED ORDER — ALBUTEROL SULFATE HFA 108 (90 BASE) MCG/ACT IN AERS: 2.0000 | INHALATION_SPRAY | Freq: Four times a day (QID) | RESPIRATORY_TRACT | 11 refills | Status: DC | PRN

## 2018-11-10 NOTE — Telephone Encounter (Signed)
Received the following message. I will transition patient to asmanex HFA 172mcg twice daily and proventil.  Completed patient assistance forms & will send back to [facility] pharmacist.  Leeanne Rio, MD    ----------------------------------------------------------  Hi [name],   Ms. Goin is having trouble affording her inhalers. We have two programs that she would qualify for based on her financial status that would provide this patient with 1 year of inhalers.. [employer] manufacturers Proventil (albuterol) and Asmanex HFA (mometasone) which would be the drug class equivalents to Ms. Folkes's current asthma therapy. In-vitro studies show that mometasone is similar in potency to fluticasone. It appears based on her current dosing of Flovent HFA 168mcg twice daily she would transition to Asmanex HFA 131mcg twice daily. Proventil (albuterol) would be the exact same dosing. We have mailed PAP forms to your office if you deem this switch appropriate.    Thank you for your time and consideration,  Regina Eck, PharmD, [place]  [phone]

## 2018-11-14 ENCOUNTER — Other Ambulatory Visit: Payer: Self-pay | Admitting: Pharmacy Technician

## 2018-11-14 NOTE — Patient Outreach (Signed)
[place] [facility]) Care Management  11/14/2018  Diana Roach 06/16/1951 [id number]    Unsuccessful call #1 placed to patient regarding patient assistance application(s) for Asmanex and Proventil HFA , HIPAA compliant voicemail left.   Will make 2nd call attempt in 2-3 business days if call has not been returned.  Maud Deed Chana Bode [place] Certified Pharmacy Technician [place] Management Direct Dial:[phone]

## 2018-11-15 ENCOUNTER — Other Ambulatory Visit: Payer: Self-pay | Admitting: Pharmacy Technician

## 2018-11-15 NOTE — Patient Outreach (Signed)
[place] [facility]) Care Management  11/15/2018  Diana Roach Mar 17, 1951 [id number]   Incoming call from patient, HIPAA identifiers verified. Patient confirmed that she received [employer] application and mailed it back at the end of last week.  Will mail completed application to [employer] once document has been received.  Maud Deed Chana Bode [place] Certified Pharmacy Technician [place] Management Direct Dial:[phone]

## 2018-11-20 ENCOUNTER — Other Ambulatory Visit: Payer: Self-pay | Admitting: Pharmacy Technician

## 2018-11-20 ENCOUNTER — Other Ambulatory Visit: Payer: Self-pay | Admitting: Pharmacist

## 2018-11-20 NOTE — Patient Outreach (Signed)
[facility]) Care Management  11/20/2018  Diana Roach Dec 31, 1950 [id number]   Incoming call from patient stating she has been approved for Full Low Income Subsidy.   Contact [facility] on [address] and was informed that patient did not have updated scripts for her inhalers on file.  In-Basket sent to [name] requesting a 3 month script of patients Flovent and Proair inhalers be sent into [facility] on [address].  Informed patient that we would no longer be able to apply to [employer] patient assistance and she stated that was fine.  Will follow up with patient once I've received confirmation that scripts have been sent in.  Maud Deed Chana Bode [place] Certified Pharmacy Technician [place] Management Direct Dial:[phone]

## 2018-11-20 NOTE — Patient Outreach (Signed)
[place] [facility]) Care Management  11/20/2018  Diana Roach May 30, 1951 [id number]   Incoming call from Diana Roach stating that she had been approved for [facility] income subsidy where her prescriptions are now only $3 (generics) and $8 (brand). She is excited to get lower copays (this normally doesn't get approved!). The prescription assistance will no longer be necessary.  Message sent to MD to request new prescriptions for Flovent HFA inhalers 146mcg BID and Albuterol to [facility] on [address].  Patient has about 1.5 wks left of supply. [phone] (Phone) [phone] (Fax)  PLAN: -I will f/u by end of week & close case  Regina Eck, PharmD, [place]  [phone]

## 2018-11-22 DIAGNOSIS — M47812 Spondylosis without myelopathy or radiculopathy, cervical region: Secondary | ICD-10-CM | POA: Diagnosis not present

## 2018-11-22 DIAGNOSIS — M25512 Pain in left shoulder: Secondary | ICD-10-CM | POA: Diagnosis not present

## 2018-11-22 DIAGNOSIS — M7542 Impingement syndrome of left shoulder: Secondary | ICD-10-CM | POA: Diagnosis not present

## 2018-11-22 DIAGNOSIS — G5603 Carpal tunnel syndrome, bilateral upper limbs: Secondary | ICD-10-CM | POA: Diagnosis not present

## 2018-11-24 ENCOUNTER — Ambulatory Visit: Payer: PPO | Admitting: Pharmacist

## 2018-11-24 ENCOUNTER — Other Ambulatory Visit: Payer: Self-pay | Admitting: Family Medicine

## 2018-11-24 MED ORDER — ALBUTEROL SULFATE HFA 108 (90 BASE) MCG/ACT IN AERS: 2.0000 | INHALATION_SPRAY | Freq: Four times a day (QID) | RESPIRATORY_TRACT | 11 refills | Status: DC | PRN

## 2018-11-24 MED ORDER — FLUTICASONE PROPIONATE HFA 110 MCG/ACT IN AERO: 1.0000 | INHALATION_SPRAY | Freq: Two times a day (BID) | RESPIRATORY_TRACT | 11 refills | Status: DC

## 2018-11-27 ENCOUNTER — Ambulatory Visit: Payer: Self-pay | Admitting: Pharmacist

## 2018-11-27 ENCOUNTER — Other Ambulatory Visit: Payer: Self-pay | Admitting: Pharmacist

## 2018-11-27 NOTE — Patient Outreach (Signed)
[place] [facility]) Care Management [place]  11/27/2018  DOT SPLINTER 1951-05-29 [id number]  Reason for referral: medication assistance  [facility] pharmacy case is being closed due to the following reasons:  Goals have been met. Full low income subsidy approved.  Flovent HFA 185mg -60 day supply $8.95.  Patient is going to pick up today! She is grateful for [place].  [facility] has been provided [facility] contact information if assistance needed in the future.    Thank you for allowing [facility] to be involved in this patient's care.     JRegina Eck PharmD, [place] [phone]

## 2018-11-30 ENCOUNTER — Other Ambulatory Visit: Payer: Self-pay | Admitting: Family Medicine

## 2018-11-30 MED ORDER — ALBUTEROL SULFATE HFA 108 (90 BASE) MCG/ACT IN AERS: 2.0000 | INHALATION_SPRAY | Freq: Four times a day (QID) | RESPIRATORY_TRACT | 3 refills | Status: DC | PRN

## 2018-12-14 DIAGNOSIS — H04123 Dry eye syndrome of bilateral lacrimal glands: Secondary | ICD-10-CM | POA: Diagnosis not present

## 2018-12-14 DIAGNOSIS — H25813 Combined forms of age-related cataract, bilateral: Secondary | ICD-10-CM | POA: Diagnosis not present

## 2018-12-14 DIAGNOSIS — H40033 Anatomical narrow angle, bilateral: Secondary | ICD-10-CM | POA: Diagnosis not present

## 2018-12-22 ENCOUNTER — Other Ambulatory Visit: Payer: Self-pay | Admitting: Family Medicine

## 2018-12-22 DIAGNOSIS — Z1231 Encounter for screening mammogram for malignant neoplasm of breast: Secondary | ICD-10-CM

## 2019-01-29 ENCOUNTER — Ambulatory Visit: Payer: PPO

## 2019-02-21 ENCOUNTER — Telehealth (INDEPENDENT_AMBULATORY_CARE_PROVIDER_SITE_OTHER): Payer: PPO | Admitting: Family Medicine

## 2019-02-21 ENCOUNTER — Other Ambulatory Visit: Payer: Self-pay

## 2019-02-21 DIAGNOSIS — R002 Palpitations: Secondary | ICD-10-CM | POA: Diagnosis not present

## 2019-02-21 NOTE — Assessment & Plan Note (Signed)
Along with fatigue and lightheadness.  Large differential.  Needs office visit for exam vitals and likely labs.  No resp symptoms so less likely corona.  Asked her if develops chest pain or acute worsening of any of her symptoms to seek emergent attention.  If any worsening or changes before Friday to call us She agrees

## 2019-02-21 NOTE — Progress Notes (Signed)
[place] Telemedicine Visit  Patient consented to have virtual visit. Method of visit: Telephone  Encounter participants: Patient: Domingo Cocking - located at home Provider: Lind Covert - located at office Others (if applicable): no  Chief Complaint: Multiple  HPI: Lightheadedness - for last few months feels dizzy when bends over and stands up.  No falls or nausea and vomiting  Heart palpitations - for last few weeks feels her heart is beating heavy.  No chest pain or shortness of breath   Fatigue - for last 1-2 weeks.  Doesn't feel right.  Low energy sometimes feels hot.  No fever no cough no shortness of breath no edema No change in medications.  No increased urinations  Asthma - using her inhalers feels her breathing is normal for her  ROS: per HPI  Pertinent PMHx: diet controlled diabetes, asthma  Exam:  Respiratory: sound normal with normal speech Psych:  Cognition and judgment appear intact. Alert, communicative  and cooperative with normal attention span and concentration. No apparent delusions, illusions, hallucinations   Assessment/Plan:  Palpitations Along with fatigue and lightheadness.  Large differential.  Needs office visit for exam vitals and likely labs.  No resp symptoms so less likely corona.  Asked her if develops chest pain or acute worsening of any of her symptoms to seek emergent attention.  If any worsening or changes before Friday to call us She agrees     Time spent during visit with patient: 21 minutes

## 2019-02-23 ENCOUNTER — Ambulatory Visit (INDEPENDENT_AMBULATORY_CARE_PROVIDER_SITE_OTHER): Payer: PPO | Admitting: Family Medicine

## 2019-02-23 ENCOUNTER — Other Ambulatory Visit: Payer: Self-pay

## 2019-02-23 VITALS — BP 130/64 | HR 74 | Ht 61.0 in | Wt 147.4 lb

## 2019-02-23 DIAGNOSIS — E119 Type 2 diabetes mellitus without complications: Secondary | ICD-10-CM

## 2019-02-23 DIAGNOSIS — R002 Palpitations: Secondary | ICD-10-CM | POA: Diagnosis not present

## 2019-02-23 NOTE — Patient Instructions (Addendum)
We will check some labs today. If results require attention, either myself or my nurse will get in touch with you. If everything is normal, you will get a letter in the mail or a message in My Chart. Please give [place] a call if you do not hear from [place] after 2 weeks.

## 2019-02-23 NOTE — Assessment & Plan Note (Signed)
No red flag symptoms.  Check CBC to rule out anemia.  Check TSH for thyroid concerns.  Again reviewed red flag symptoms with patient for reasons to go the ER.  Patient voiced good understanding.

## 2019-02-23 NOTE — Progress Notes (Signed)
    Subjective:  Diana Roach is a 68 y.o. female who presents to the [facility] today with a chief complaint of palpitations.   HPI:  Patient states that she has had intermittent heart palpitations for the last few weeks.  It is not brought on by any exertion.  She has no associated chest pain or shortness of breath.  She also has had some lightheadedness/dizziness.  She has not had any loss of consciousness or vision changes.  She has felt fatigued for the last several weeks.  She is able to get up and back in the house but then is exhausted afterwards.  She has had no blood in her stool.  She has noticed some recent weight gain over the last month.  She has noticed some  nail changes they have been more brittle and some vertical grooves.  ROS: Per HPI   Objective:  Physical Exam: BP 130/64   Pulse 74   Ht 5\' 1"  (1.549 m)   Wt 147 lb 6 oz (66.8 kg)   SpO2 97%   BMI 27.85 kg/m   Gen: NAD, resting comfortably HEENT: no thyromegaly CV: RRR with no murmurs appreciated Pulm: NWOB, CTAB with no crackles, wheezes, or rhonchi GI: Normal bowel sounds present. Soft, Nontender, Nondistended. MSK: no edema, cyanosis, or clubbing noted Skin: warm, dry Neuro: grossly normal, moves all extremities Psych: Normal affect and thought content   Assessment/Plan:  Palpitations No red flag symptoms.  Check CBC to rule out anemia.  Check TSH for thyroid concerns.  Again reviewed red flag symptoms with patient for reasons to go the ER.  Patient voiced good understanding.  Type 2 diabetes mellitus without complication, without long-term current use of insulin ([place]) Patient states she is a diet-controlled diabetic.  Will recheck A1c and BMP today with blood work.    Orders Placed This Encounter  Procedures  . CBC  . Basic Metabolic Panel  . TSH  . Hemoglobin A1c     Bufford Lope, DO PGY-3, [place] [facility] 02/23/2019 2:00 PM

## 2019-02-23 NOTE — Assessment & Plan Note (Signed)
Patient states she is a diet-controlled diabetic.  Will recheck A1c and BMP today with blood work.

## 2019-02-24 ENCOUNTER — Encounter: Payer: Self-pay | Admitting: Family Medicine

## 2019-02-24 LAB — BASIC METABOLIC PANEL
BUN/Creatinine Ratio: 13 (ref 12–28)
BUN: 11 mg/dL (ref 8–27)
CO2: 23 mmol/L (ref 20–29)
Calcium: 10.2 mg/dL (ref 8.7–10.3)
Chloride: 101 mmol/L (ref 96–106)
Creatinine, Ser: 0.86 mg/dL (ref 0.57–1.00)
GFR calc Af Amer: 81 mL/min/{1.73_m2} (ref >59)
GFR calc non Af Amer: 70 mL/min/{1.73_m2} (ref >59)
Glucose: 121 mg/dL — ABNORMAL HIGH (ref 65–99)
Potassium: 4 mmol/L (ref 3.5–5.2)
Sodium: 139 mmol/L (ref 134–144)

## 2019-02-24 LAB — TSH: TSH: 1.77 u[IU]/mL (ref 0.450–4.500)

## 2019-02-24 LAB — CBC
Hematocrit: 38.8 % (ref 34.0–46.6)
Hemoglobin: 12.6 g/dL (ref 11.1–15.9)
MCH: 26.3 pg — ABNORMAL LOW (ref 26.6–33.0)
MCHC: 32.5 g/dL (ref 31.5–35.7)
MCV: 81 fL (ref 79–97)
Platelets: 402 10*3/uL (ref 150–450)
RBC: 4.79 x10E6/uL (ref 3.77–5.28)
RDW: 14.9 % (ref 11.7–15.4)
WBC: 12.2 10*3/uL — ABNORMAL HIGH (ref 3.4–10.8)

## 2019-02-24 LAB — HEMOGLOBIN A1C
Est. average glucose Bld gHb Est-mCnc: 146 mg/dL
Hgb A1c MFr Bld: 6.7 % — ABNORMAL HIGH (ref 4.8–5.6)

## 2019-03-12 ENCOUNTER — Ambulatory Visit: Payer: PPO

## 2019-03-16 ENCOUNTER — Ambulatory Visit (HOSPITAL_COMMUNITY)
Admission: RE | Admit: 2019-03-16 | Discharge: 2019-03-16 | Disposition: A | Discharge status: Home / Self Care | Payer: PPO | Source: Ambulatory Visit | Attending: Family Medicine | Admitting: Family Medicine

## 2019-03-16 ENCOUNTER — Other Ambulatory Visit: Payer: Self-pay

## 2019-03-16 ENCOUNTER — Ambulatory Visit (INDEPENDENT_AMBULATORY_CARE_PROVIDER_SITE_OTHER): Payer: PPO | Admitting: Family Medicine

## 2019-03-16 ENCOUNTER — Encounter: Payer: Self-pay | Admitting: Family Medicine

## 2019-03-16 VITALS — BP 138/78 | HR 78

## 2019-03-16 DIAGNOSIS — I498 Other specified cardiac arrhythmias: Secondary | ICD-10-CM | POA: Diagnosis not present

## 2019-03-16 DIAGNOSIS — R002 Palpitations: Secondary | ICD-10-CM

## 2019-03-16 DIAGNOSIS — E119 Type 2 diabetes mellitus without complications: Secondary | ICD-10-CM | POA: Diagnosis not present

## 2019-03-16 NOTE — Progress Notes (Signed)
Date of Visit: 03/16/2019   HPI:  Patient presents to follow up on palpitations.  Was having palpitations every day, now are occurring 1-2 times per week. Had labs checked that were unremarkable. Palpitations happen at rest and during exertion. No chest pain. Is more tired than usual, mildly more shortness of breath than usual as well (has history of asthma).  Hair falling out for the last month or so, has balding spot on anterior head. Does not wear hair pulled back very tight.  ROS: See HPI.  [place]: history of asthma, hyperlipidemia, type 2 diabetes, lichen planus  PHYSICAL EXAM: BP 138/78   Pulse 78   SpO2 97%  Gen: no acute distress, pleasant, cooperative HEENT: normocephalic, atraumatic, moist mucous membranes  Heart: regular rate and rhythm, no murmur Lungs: clear to auscultation bilaterally, normal work of breathing  Neuro: alert, grossly nonfocal,s peech normal Ext: No appreciable lower extremity edema bilaterally  Skin: mild balding of scalp on anterior midline of head. Longitudinal groove on one fingernail on R hand, normally pigmented  ASSESSMENT/PLAN:  Health maintenance:  -foot exam done today, decreased sensation bilaterally with monofilament testing, advised checking feet daily -will request records from [facility] care (last visit in Feb) -urine micro done today  Palpitations EKG today unremarkable. Schedule echo in light of recent fatigue and mild dyspnea. No infectious symptoms. Patient to call if they are worsening.  Hair falling out Recent normal TSH, advised she can see derm if she would like  Longitudinal groove in nail May be related to her known lichen planus Recommend seeing derm if she wants more clarification of dx   FOLLOW UP: Follow up in 6 mos for diabetes, sooner if needed  [name], [place]

## 2019-03-16 NOTE — Patient Instructions (Signed)
Getting ultrasound of heart Overall things look good - EKG looks good  Call if palpitations becoming more frequent

## 2019-03-16 NOTE — Assessment & Plan Note (Addendum)
EKG today unremarkable. Schedule echo in light of recent fatigue and mild dyspnea. No infectious symptoms. Patient to call if they are worsening.

## 2019-03-17 LAB — MICROALBUMIN / CREATININE URINE RATIO
Creatinine, Urine: 55.3 mg/dL
Microalb/Creat Ratio: <5 mg/g creat (ref 0–29)
Microalbumin, Urine: <3 ug/mL

## 2019-03-18 ENCOUNTER — Encounter: Payer: Self-pay | Admitting: Family Medicine

## 2019-03-22 ENCOUNTER — Other Ambulatory Visit: Payer: Self-pay

## 2019-03-22 ENCOUNTER — Ambulatory Visit (HOSPITAL_COMMUNITY)
Admission: RE | Admit: 2019-03-22 | Discharge: 2019-03-22 | Disposition: A | Discharge status: Home / Self Care | Payer: PPO | Source: Ambulatory Visit | Attending: Family Medicine | Admitting: Family Medicine

## 2019-03-22 DIAGNOSIS — R002 Palpitations: Secondary | ICD-10-CM | POA: Insufficient documentation

## 2019-03-22 DIAGNOSIS — I351 Nonrheumatic aortic (valve) insufficiency: Secondary | ICD-10-CM | POA: Diagnosis not present

## 2019-03-30 ENCOUNTER — Encounter: Payer: Self-pay | Admitting: Family Medicine

## 2019-04-06 ENCOUNTER — Other Ambulatory Visit: Payer: Self-pay | Admitting: Pharmacist

## 2019-04-09 NOTE — Patient Outreach (Signed)
[place] [facility]) Care Management [place]  04/09/2019  Diana Roach [birth year] [id number]  Incoming call from [name] regarding Medicaid approval.  Directed patient to DSS worker as listed on her Medicaid approval letter.  Also, reminded patient to bring Medicaid card to pharmacy and MD appointments as they may not have her new insurance information.  Patient appreciative of support.  Patient has been provided [facility] CM contact information if assistance needed in the future.    Thank you for allowing [facility] pharmacy to be involved in this patient's care.    Regina Eck, PharmD, [place]  [phone]

## 2019-04-11 DIAGNOSIS — L438 Other lichen planus: Secondary | ICD-10-CM | POA: Diagnosis not present

## 2019-04-11 DIAGNOSIS — L820 Inflamed seborrheic keratosis: Secondary | ICD-10-CM | POA: Diagnosis not present

## 2019-04-23 ENCOUNTER — Ambulatory Visit
Admission: RE | Admit: 2019-04-23 | Discharge: 2019-04-23 | Disposition: A | Discharge status: Home / Self Care | Payer: PPO | Source: Ambulatory Visit | Attending: Family Medicine | Admitting: Family Medicine

## 2019-04-23 ENCOUNTER — Other Ambulatory Visit: Payer: Self-pay

## 2019-04-23 DIAGNOSIS — Z1231 Encounter for screening mammogram for malignant neoplasm of breast: Secondary | ICD-10-CM | POA: Diagnosis not present

## 2019-05-14 ENCOUNTER — Other Ambulatory Visit: Payer: Self-pay

## 2019-05-14 ENCOUNTER — Telehealth (INDEPENDENT_AMBULATORY_CARE_PROVIDER_SITE_OTHER): Payer: PPO | Admitting: Family Medicine

## 2019-05-14 DIAGNOSIS — U071 COVID-19: Secondary | ICD-10-CM | POA: Diagnosis not present

## 2019-05-14 NOTE — Progress Notes (Signed)
[place] Telemedicine Visit  Patient consented to have virtual visit. Method of visit: Telephone  Encounter participants: Patient: Diana Roach - located at home Provider: [place] - located at office Others (if applicable): Daughter  Chief Complaint: possible exposure to COVID19  HPI:  Patient with symptoms of sore throat, chills, nasal congestion.  Grandson was recently tested for coronavirus due to fever.  His test results are pending.  She has taken her temperature but denies any fevers.  Patient is using OTC medications to treat symptoms.  Patient is not having any shortness of breath or chest pain.  ROS: per HPI  Pertinent PMHx: Asthma, type 2 diabetes  Exam:  Respiratory: Able to speak in full sentences without stopping or pausing for breath  Assessment/Plan: Given patient's symptoms and possible exposure to someone with coronavirus.  Recommend the patient get tested.  Will submit test.  Patient can go to [place] to get tested.  Recommend patient self quarantine while waiting otherwise.  Patient given strict return precautions.  Time spent during visit with patient: 5 minutes

## 2019-05-15 ENCOUNTER — Other Ambulatory Visit: Payer: Self-pay

## 2019-05-15 DIAGNOSIS — Z20822 Contact with and (suspected) exposure to covid-19: Secondary | ICD-10-CM

## 2019-05-15 DIAGNOSIS — R6889 Other general symptoms and signs: Secondary | ICD-10-CM | POA: Diagnosis not present

## 2019-05-17 LAB — NOVEL CORONAVIRUS, NAA: SARS-CoV-2, NAA: DETECTED — AB

## 2019-05-17 NOTE — Progress Notes (Signed)
Weird. I ordered the test... I will call her today.

## 2019-05-19 ENCOUNTER — Emergency Department (HOSPITAL_COMMUNITY): Payer: PPO

## 2019-05-19 ENCOUNTER — Emergency Department (HOSPITAL_COMMUNITY)
Admission: EM | Admit: 2019-05-19 | Discharge: 2019-05-19 | Disposition: A | Discharge status: Home / Self Care | Payer: PPO | Attending: Emergency Medicine | Admitting: Emergency Medicine

## 2019-05-19 ENCOUNTER — Encounter (HOSPITAL_COMMUNITY): Payer: Self-pay

## 2019-05-19 ENCOUNTER — Other Ambulatory Visit: Payer: Self-pay

## 2019-05-19 DIAGNOSIS — U071 COVID-19: Secondary | ICD-10-CM | POA: Insufficient documentation

## 2019-05-19 DIAGNOSIS — R0602 Shortness of breath: Secondary | ICD-10-CM | POA: Diagnosis not present

## 2019-05-19 DIAGNOSIS — Z87891 Personal history of nicotine dependence: Secondary | ICD-10-CM | POA: Insufficient documentation

## 2019-05-19 DIAGNOSIS — J45909 Unspecified asthma, uncomplicated: Secondary | ICD-10-CM | POA: Insufficient documentation

## 2019-05-19 DIAGNOSIS — E119 Type 2 diabetes mellitus without complications: Secondary | ICD-10-CM | POA: Diagnosis not present

## 2019-05-19 LAB — PROCALCITONIN: Procalcitonin: <0.1 ng/mL

## 2019-05-19 LAB — COMPREHENSIVE METABOLIC PANEL
ALT: 35 U/L (ref 0–44)
AST: 57 U/L — ABNORMAL HIGH (ref 15–41)
Albumin: 3.8 g/dL (ref 3.5–5.0)
Alkaline Phosphatase: 67 U/L (ref 38–126)
Anion gap: 10 (ref 5–15)
BUN: 8 mg/dL (ref 8–23)
CO2: 23 mmol/L (ref 22–32)
Calcium: 8.5 mg/dL — ABNORMAL LOW (ref 8.9–10.3)
Chloride: 102 mmol/L (ref 98–111)
Creatinine, Ser: 0.92 mg/dL (ref 0.44–1.00)
GFR calc Af Amer: >60 mL/min (ref >60)
GFR calc non Af Amer: >60 mL/min (ref >60)
Glucose, Bld: 142 mg/dL — ABNORMAL HIGH (ref 70–99)
Potassium: 4.2 mmol/L (ref 3.5–5.1)
Sodium: 135 mmol/L (ref 135–145)
Total Bilirubin: 0.8 mg/dL (ref 0.3–1.2)
Total Protein: 7.5 g/dL (ref 6.5–8.1)

## 2019-05-19 LAB — C-REACTIVE PROTEIN: CRP: <0.8 mg/dL (ref <1.0)

## 2019-05-19 LAB — CBC WITH DIFFERENTIAL/PLATELET
Abs Immature Granulocytes: 0.03 10*3/uL (ref 0.00–0.07)
Basophils Absolute: 0 10*3/uL (ref 0.0–0.1)
Basophils Relative: 0 %
Eosinophils Absolute: 0 10*3/uL (ref 0.0–0.5)
Eosinophils Relative: 0 %
HCT: 43.8 % (ref 36.0–46.0)
Hemoglobin: 13.7 g/dL (ref 12.0–15.0)
Immature Granulocytes: 0 %
Lymphocytes Relative: 22 %
Lymphs Abs: 1.7 10*3/uL (ref 0.7–4.0)
MCH: 25.5 pg — ABNORMAL LOW (ref 26.0–34.0)
MCHC: 31.3 g/dL (ref 30.0–36.0)
MCV: 81.6 fL (ref 80.0–100.0)
Monocytes Absolute: 0.4 10*3/uL (ref 0.1–1.0)
Monocytes Relative: 5 %
Neutro Abs: 5.7 10*3/uL (ref 1.7–7.7)
Neutrophils Relative %: 73 %
Platelets: 246 10*3/uL (ref 150–400)
RBC: 5.37 MIL/uL — ABNORMAL HIGH (ref 3.87–5.11)
RDW: 16.3 % — ABNORMAL HIGH (ref 11.5–15.5)
WBC: 7.9 10*3/uL (ref 4.0–10.5)
nRBC: 0 % (ref 0.0–0.2)

## 2019-05-19 MED ORDER — SODIUM CHLORIDE 0.9 % IV BOLUS
500.0000 mL | Freq: Once | INTRAVENOUS | Status: AC
  Administered 2019-05-19: 500 mL via INTRAVENOUS

## 2019-05-19 MED ORDER — METHYLPREDNISOLONE SODIUM SUCC 125 MG IJ SOLR
125.0000 mg | Freq: Once | INTRAMUSCULAR | Status: AC
  Administered 2019-05-19: 125 mg via INTRAVENOUS
  Filled 2019-05-19: qty 2

## 2019-05-19 MED ORDER — ALBUTEROL SULFATE HFA 108 (90 BASE) MCG/ACT IN AERS
4.0000 | INHALATION_SPRAY | RESPIRATORY_TRACT | Status: DC | PRN
  Administered 2019-05-19: 4 via RESPIRATORY_TRACT
  Filled 2019-05-19: qty 6.7

## 2019-05-19 MED ORDER — BENZONATATE 100 MG PO CAPS: 100.0000 mg | ORAL_CAPSULE | Freq: Three times a day (TID) | ORAL | 0 refills | Status: DC | PRN

## 2019-05-19 MED ORDER — PREDNISONE 20 MG PO TABS: ORAL_TABLET | ORAL | 0 refills | Status: DC

## 2019-05-19 MED ORDER — ONDANSETRON 4 MG PO TBDP: ORAL_TABLET | ORAL | 0 refills | Status: DC

## 2019-05-19 NOTE — ED Notes (Signed)
XR at bedside

## 2019-05-19 NOTE — Discharge Instructions (Addendum)
Continue to use your inhaler as needed for wheezing.  Make an online appointment with your primary physician.  If your shortness of breath worsens you need to be reevaluated.  You may take Tylenol as needed for fever and body aches.

## 2019-05-19 NOTE — ED Notes (Signed)
Pt verbalized discharge instructions and follow up care. Alert and ambulatory. No IV.  

## 2019-05-19 NOTE — ED Triage Notes (Addendum)
Pt coming from home c/o shortness of breath that started today and a cough. Pt tested positive for COVID-19 2 days ago. Fever of 101.1 at home with a most recent of 99.7 and states she didn't take tylenol yet. Hx of asthma. Talking in complete sentence. A&Ox4 and ambulatory. Grandson tested positive

## 2019-05-19 NOTE — ED Provider Notes (Signed)
[facility] DEPT Provider Note   CSN: [id number] Arrival date & time: 05/19/19  1928     History   Chief Complaint Chief Complaint  Patient presents with  . Shortness of Breath    HPI Diana Roach is a 68 y.o. female.     HPI Patient was diagnosed with COVID-19 on 7/21.  She has had worsening cough which is nonproductive and shortness of breath over the last 1 to 2 days...  Endorses fever, diffuse myalgias.    Has had nausea and decreased oral intake.  Patient does have history of asthma. Past Medical History:  Diagnosis Date  . Asthma   . Cataract   . Diabetes mellitus without complication ([place])   . Diverticulitis   . Environmental allergies   . Lichen planus     Patient Active Problem List   Diagnosis Date Noted  . Palpitations 02/21/2019  . Type 2 diabetes mellitus without complication, without long-term current use of insulin ([place]) 10/24/2017  . Varicose vein of leg 03/09/2016  . UPJ obstruction, congenital 01/28/2016  . Lumbosacral radiculopathy 01/27/2016  . Diverticulosis 10/01/2014  . Radiculopathy, cervical 09/04/2014  . Bilateral finger numbness 09/04/2014  . Abdominal pain 04/24/2014  . Asthma, moderate persistent 05/13/2011  . Lichen planus 73/71/0626  . 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 HYPERLIPIDEMIA 12/22/2006    Past Surgical History:  Procedure Laterality Date  . APPENDECTOMY  1969  . BACK SURGERY  1986   lumbar spine  . CARPAL TUNNEL RELEASE Bilateral   . COLONOSCOPY  2015     OB History   No obstetric history on file.      Home Medications    Prior to Admission medications   Medication Sig Start Date End Date Taking? Authorizing Provider  albuterol (PROVENTIL HFA) 108 (90 Base) MCG/ACT inhaler Inhale 2 puffs into the lungs every 6 (six) hours as needed for wheezing or shortness of breath. 11/30/18   Leeanne Rio, MD  Aspirin-Acetaminophen-Caffeine (GOODY HEADACHE PO) Take 1 packet by mouth  as needed (for headaches or pain).     [provider]  benzonatate (TESSALON) 100 MG capsule Take 1 capsule (100 mg total) by mouth 3 (three) times daily as needed for cough. 05/19/19   Julianne Rice, MD  fluticasone (FLOVENT HFA) 110 MCG/ACT inhaler Inhale 1 puff into the lungs 2 (two) times daily. 11/24/18   Leeanne Rio, MD  ondansetron (ZOFRAN ODT) 4 MG disintegrating tablet 4mg  ODT q4 hours prn nausea/vomit 05/19/19   Julianne Rice, MD  polyethylene glycol [facility] Floria Raveling) packet Take 17 g by mouth See admin instructions. Mix 17 grams into 4-8 ounces of water and drink one to two times a week as needed for constipation    [provider]  predniSONE (DELTASONE) 20 MG tablet 3 tabs po day one, then 2 po daily x 4 days 05/19/19   Julianne Rice, MD  triamcinolone cream (KENALOG) 0.1 % Apply 1 application topically See admin instructions. Apply to bilateral legs two to three times a day for Lichen Planus    [provider]    Family History Family History  Problem Relation Age of Onset  . Hypothyroidism Sister   . Cervical cancer Sister   . Fibromyalgia Sister   . Heart disease Mother   . Hypertension Brother   . Lichen planus Brother   . Cancer Brother        lung  . Heart attack Brother   . Diabetes Daughter   .  Cancer Daughter   . Colon cancer Neg Hx   . Pancreatic cancer Neg Hx   . Stomach cancer Neg Hx   . Esophageal cancer Neg Hx   . Breast cancer Neg Hx     Social History Social History   Tobacco Use  . Smoking status: Former Smoker    Quit date: 10/25/1994    Years since quitting: 24.5  . Smokeless tobacco: Never Used  . Tobacco comment: no plans to start again  Substance Use Topics  . Alcohol use: No  . Drug use: No     Allergies   Codeine, Minocycline, Contrast media [iodinated diagnostic agents], and Levaquin [levofloxacin]   Review of Systems Review of Systems  Constitutional: Positive for appetite change,  chills, fatigue and fever.  HENT: Negative for sore throat and trouble swallowing.   Eyes: Negative for visual disturbance.  Respiratory: Positive for cough, shortness of breath and wheezing.   Cardiovascular: Negative for chest pain.  Gastrointestinal: Positive for nausea. Negative for abdominal pain, constipation, diarrhea and vomiting.  Musculoskeletal: Positive for myalgias. Negative for neck pain and neck stiffness.  Skin: Negative for rash and wound.  Neurological: Negative for dizziness, weakness, light-headedness, numbness and headaches.  All other systems reviewed and are negative.    Physical Exam Updated Vital Signs BP (!) 141/81   Pulse 71   Temp 99.8 F (37.7 C) (Oral)   Resp 18   Ht 5\' 1"  (1.549 m)   Wt 66.7 kg   SpO2 97%   BMI 27.78 kg/m   Physical Exam Vitals signs and nursing note reviewed.  Constitutional:      Appearance: She is well-developed.  HENT:     Head: Normocephalic and atraumatic.     Nose: Nose normal.     Mouth/Throat:     Mouth: Mucous membranes are moist.  Eyes:     Extraocular Movements: Extraocular movements intact.     Pupils: Pupils are equal, round, and reactive to light.  Neck:     Musculoskeletal: Normal range of motion and neck supple.  Cardiovascular:     Rate and Rhythm: Normal rate and regular rhythm.  Pulmonary:     Effort: Pulmonary effort is normal.     Breath sounds: Normal breath sounds.     Comments: Few scattered rhonchi and expiratory wheezing.  No respiratory distress. Abdominal:     General: Bowel sounds are normal.     Palpations: Abdomen is soft.     Tenderness: There is no abdominal tenderness. There is no guarding or rebound.  Musculoskeletal: Normal range of motion.        General: No swelling, tenderness, deformity or signs of injury.     Right lower leg: No edema.     Left lower leg: No edema.  Skin:    General: Skin is warm and dry.     Capillary Refill: Capillary refill takes less than 2 seconds.      Findings: No erythema or rash.  Neurological:     General: No focal deficit present.     Mental Status: She is alert and oriented to person, place, and time.  Psychiatric:        Mood and Affect: Mood normal.        Behavior: Behavior normal.      ED Treatments / Results  Labs (all labs ordered are listed, but only abnormal results are displayed) Labs Reviewed  CBC WITH DIFFERENTIAL/PLATELET - Abnormal; Notable for the following components:  Result Value   RBC 5.37 (*)    MCH 25.5 (*)    RDW 16.3 (*)    All other components within normal limits  COMPREHENSIVE METABOLIC PANEL - Abnormal; Notable for the following components:   Glucose, Bld 142 (*)    Calcium 8.5 (*)    AST 57 (*)    All other components within normal limits  PROCALCITONIN  C-REACTIVE PROTEIN    EKG None  Radiology Dg Chest Port 1 View  Result Date: 05/19/2019 CLINICAL DATA:  Shortness of breath EXAM: PORTABLE CHEST 1 VIEW COMPARISON:  June 16, 2018 FINDINGS: The heart size is stable. There are new subtle bilateral hazy airspace opacities. There is no pneumothorax. No large pleural effusion. No acute osseous abnormality. IMPRESSION: New subtle bilateral hazy airspace opacities which can be seen in patients with an atypical pneumonia. Electronically Signed   By: Constance Holster M.D.   On: 05/19/2019 20:22    Procedures Procedures (including critical care time)  Medications Ordered in ED Medications  albuterol (VENTOLIN HFA) 108 (90 Base) MCG/ACT inhaler 4 puff (4 puffs Inhalation Given 05/19/19 2031)  methylPREDNISolone sodium succinate (SOLU-MEDROL) 125 mg/2 mL injection 125 mg (125 mg Intravenous Given 05/19/19 2029)  sodium chloride 0.9 % bolus 500 mL (0 mLs Intravenous Stopped 05/19/19 2145)     Initial Impression / Assessment and Plan / ED Course  I have reviewed the triage vital signs and the nursing notes.  Pertinent labs & imaging results that were available during my care of the  patient were reviewed by me and considered in my medical decision making (see chart for details).        Patient with recently diagnosed COVID-19.  Appears to have worsening pulmonary symptoms.  Her underlying asthma may be contributing to this.  Given IV fluids, Solu-Medrol and inhaler while in the emergency department.  Coughing and wheezing have improved.  Chest x-ray does have atypical infiltrates.  Patient is maintaining saturations in the mid to high 90s on room air.  Vital signs are stable.  Tolerating oral intake.  Strict return precautions have been given and advised to follow-up via telemedicine with her primary provider.  Final Clinical Impressions(s) / ED Diagnoses   Final diagnoses:  [id number]    ED Discharge Orders         Ordered    ondansetron (ZOFRAN ODT) 4 MG disintegrating tablet     05/19/19 2148    benzonatate (TESSALON) 100 MG capsule  3 times daily PRN     05/19/19 2148    predniSONE (DELTASONE) 20 MG tablet     05/19/19 2148           Julianne Rice, MD 05/19/19 2154

## 2019-05-21 ENCOUNTER — Other Ambulatory Visit: Payer: Self-pay

## 2019-05-21 ENCOUNTER — Telehealth (INDEPENDENT_AMBULATORY_CARE_PROVIDER_SITE_OTHER): Payer: PPO | Admitting: Family Medicine

## 2019-05-21 DIAGNOSIS — U071 COVID-19: Secondary | ICD-10-CM

## 2019-05-21 DIAGNOSIS — J069 Acute upper respiratory infection, unspecified: Secondary | ICD-10-CM | POA: Diagnosis not present

## 2019-05-21 NOTE — Progress Notes (Signed)
[facility] Telemedicine Visit  Patient consented to have virtual visit. Method of visit: Telephone  Encounter participants: Patient: Diana Roach - located at home Provider: Bonnita Hollow - located at office Others (if applicable): none  Chief Complaint: ED follow up  HPI:  Patient recently diagnosed with coronavirus there is to the emergency department for worsening symptoms.  Also has a history of asthma.  Was evaluated in the ED and found to have atypical air infiltrates on chest x-ray.  Oxygen saturation was good and did not require any supplemental O2.  She was discharged home with prednisone and an albuterol inhaler for the asthma with close follow-up with PCP.  Today's visit is that follow-up.  Patient reports that she still somewhat dyspneic but has improvement with the inhalers.  She has better than when she went to the emergency department.  She still has cough and sweats and body aches.  She is taking her prednisone and using her inhaler for dyspnea.  She is not taking Tylenol.  Patient also reported some diarrhea but this is slow down and resolved.  She is tolerating p.o. drink well and some p.o. food.  She is self quarantining at home.  ROS: per HPI  Pertinent PMHx: Asthma  Exam:  Respiratory: Able speak in full sentences without any positive breath  Assessment/Plan: COVID-19 Patient recently diagnosed with COVID-19.  Had worsening symptoms and went to the emergency department.  Was started on steroids and albuterol inhaler.  Appears to be improving.  Recommend supportive care for other symptoms including Tylenol, Tessalon Perles.  Strict return precautions given to patient.  Patient to follow-up PRN if not improving.  Recommended patient continue to self quarantine for total of 14 days..  Time spent during visit with patient: 6 minutes

## 2019-05-22 ENCOUNTER — Telehealth: Payer: Self-pay | Admitting: *Deleted

## 2019-05-22 NOTE — Telephone Encounter (Signed)
Daughter states that her mom seems to be having a harder time breathing than in the past few days.    Advised to take to ED given that it is 5pm. Christen Bame, CMA

## 2019-05-23 ENCOUNTER — Emergency Department (HOSPITAL_COMMUNITY): Payer: PPO

## 2019-05-23 ENCOUNTER — Other Ambulatory Visit: Payer: Self-pay

## 2019-05-23 ENCOUNTER — Encounter (HOSPITAL_COMMUNITY): Payer: Self-pay

## 2019-05-23 ENCOUNTER — Inpatient Hospital Stay (HOSPITAL_COMMUNITY)
Admission: EM | Admit: 2019-05-23 | Discharge: 2019-06-04 | DRG: 177 | Disposition: A | Discharge status: Home Health | Payer: PPO | Attending: Internal Medicine | Admitting: Internal Medicine

## 2019-05-23 DIAGNOSIS — T380X5A Adverse effect of glucocorticoids and synthetic analogues, initial encounter: Secondary | ICD-10-CM | POA: Diagnosis not present

## 2019-05-23 DIAGNOSIS — E871 Hypo-osmolality and hyponatremia: Secondary | ICD-10-CM | POA: Diagnosis not present

## 2019-05-23 DIAGNOSIS — Z881 Allergy status to other antibiotic agents status: Secondary | ICD-10-CM | POA: Diagnosis not present

## 2019-05-23 DIAGNOSIS — J4540 Moderate persistent asthma, uncomplicated: Secondary | ICD-10-CM | POA: Diagnosis present

## 2019-05-23 DIAGNOSIS — Z7951 Long term (current) use of inhaled steroids: Secondary | ICD-10-CM

## 2019-05-23 DIAGNOSIS — J1289 Other viral pneumonia: Secondary | ICD-10-CM | POA: Diagnosis present

## 2019-05-23 DIAGNOSIS — Z91041 Radiographic dye allergy status: Secondary | ICD-10-CM

## 2019-05-23 DIAGNOSIS — E119 Type 2 diabetes mellitus without complications: Secondary | ICD-10-CM

## 2019-05-23 DIAGNOSIS — R918 Other nonspecific abnormal finding of lung field: Secondary | ICD-10-CM | POA: Diagnosis not present

## 2019-05-23 DIAGNOSIS — I498 Other specified cardiac arrhythmias: Secondary | ICD-10-CM | POA: Diagnosis not present

## 2019-05-23 DIAGNOSIS — J9601 Acute respiratory failure with hypoxia: Secondary | ICD-10-CM | POA: Diagnosis present

## 2019-05-23 DIAGNOSIS — Z79899 Other long term (current) drug therapy: Secondary | ICD-10-CM

## 2019-05-23 DIAGNOSIS — R000 Tachycardia, unspecified: Secondary | ICD-10-CM | POA: Diagnosis not present

## 2019-05-23 DIAGNOSIS — I951 Orthostatic hypotension: Secondary | ICD-10-CM | POA: Diagnosis not present

## 2019-05-23 DIAGNOSIS — E1165 Type 2 diabetes mellitus with hyperglycemia: Secondary | ICD-10-CM | POA: Diagnosis not present

## 2019-05-23 DIAGNOSIS — U071 COVID-19: Secondary | ICD-10-CM | POA: Diagnosis not present

## 2019-05-23 DIAGNOSIS — Z87891 Personal history of nicotine dependence: Secondary | ICD-10-CM | POA: Diagnosis not present

## 2019-05-23 DIAGNOSIS — T502X5A Adverse effect of carbonic-anhydrase inhibitors, benzothiadiazides and other diuretics, initial encounter: Secondary | ICD-10-CM | POA: Diagnosis not present

## 2019-05-23 DIAGNOSIS — K5909 Other constipation: Secondary | ICD-10-CM | POA: Diagnosis present

## 2019-05-23 DIAGNOSIS — J9600 Acute respiratory failure, unspecified whether with hypoxia or hypercapnia: Secondary | ICD-10-CM

## 2019-05-23 DIAGNOSIS — Z885 Allergy status to narcotic agent status: Secondary | ICD-10-CM

## 2019-05-23 DIAGNOSIS — E1140 Type 2 diabetes mellitus with diabetic neuropathy, unspecified: Secondary | ICD-10-CM

## 2019-05-23 DIAGNOSIS — J129 Viral pneumonia, unspecified: Secondary | ICD-10-CM | POA: Diagnosis not present

## 2019-05-23 DIAGNOSIS — J988 Other specified respiratory disorders: Secondary | ICD-10-CM | POA: Diagnosis not present

## 2019-05-23 DIAGNOSIS — J1282 Pneumonia due to coronavirus disease 2019: Secondary | ICD-10-CM

## 2019-05-23 DIAGNOSIS — R0602 Shortness of breath: Secondary | ICD-10-CM | POA: Diagnosis not present

## 2019-05-23 HISTORY — DX: COVID-19: U07.1

## 2019-05-23 LAB — COMPREHENSIVE METABOLIC PANEL
ALT: 43 U/L (ref 0–44)
AST: 49 U/L — ABNORMAL HIGH (ref 15–41)
Albumin: 3.5 g/dL (ref 3.5–5.0)
Alkaline Phosphatase: 58 U/L (ref 38–126)
Anion gap: 12 (ref 5–15)
BUN: 14 mg/dL (ref 8–23)
CO2: 21 mmol/L — ABNORMAL LOW (ref 22–32)
Calcium: 8.5 mg/dL — ABNORMAL LOW (ref 8.9–10.3)
Chloride: 102 mmol/L (ref 98–111)
Creatinine, Ser: 0.9 mg/dL (ref 0.44–1.00)
GFR calc Af Amer: >60 mL/min (ref >60)
GFR calc non Af Amer: >60 mL/min (ref >60)
Glucose, Bld: 146 mg/dL — ABNORMAL HIGH (ref 70–99)
Potassium: 3.5 mmol/L (ref 3.5–5.1)
Sodium: 135 mmol/L (ref 135–145)
Total Bilirubin: 1 mg/dL (ref 0.3–1.2)
Total Protein: 7.7 g/dL (ref 6.5–8.1)

## 2019-05-23 LAB — D-DIMER, QUANTITATIVE: D-Dimer, Quant: 3.53 ug/mL-FEU — ABNORMAL HIGH (ref 0.00–0.50)

## 2019-05-23 LAB — CBC WITH DIFFERENTIAL/PLATELET
Abs Immature Granulocytes: 0.21 10*3/uL — ABNORMAL HIGH (ref 0.00–0.07)
Basophils Absolute: 0 10*3/uL (ref 0.0–0.1)
Basophils Relative: 0 %
Eosinophils Absolute: 0 10*3/uL (ref 0.0–0.5)
Eosinophils Relative: 0 %
HCT: 38.5 % (ref 36.0–46.0)
Hemoglobin: 12.5 g/dL (ref 12.0–15.0)
Immature Granulocytes: 1 %
Lymphocytes Relative: 5 %
Lymphs Abs: 0.9 10*3/uL (ref 0.7–4.0)
MCH: 26 pg (ref 26.0–34.0)
MCHC: 32.5 g/dL (ref 30.0–36.0)
MCV: 80 fL (ref 80.0–100.0)
Monocytes Absolute: 0.4 10*3/uL (ref 0.1–1.0)
Monocytes Relative: 3 %
Neutro Abs: 14.5 10*3/uL — ABNORMAL HIGH (ref 1.7–7.7)
Neutrophils Relative %: 91 %
Platelets: 317 10*3/uL (ref 150–400)
RBC: 4.81 MIL/uL (ref 3.87–5.11)
RDW: 16.7 % — ABNORMAL HIGH (ref 11.5–15.5)
WBC: 16 10*3/uL — ABNORMAL HIGH (ref 4.0–10.5)
nRBC: 0 % (ref 0.0–0.2)

## 2019-05-23 LAB — C-REACTIVE PROTEIN: CRP: 15.5 mg/dL — ABNORMAL HIGH (ref <1.0)

## 2019-05-23 LAB — TRIGLYCERIDES: Triglycerides: 90 mg/dL (ref <150)

## 2019-05-23 LAB — LACTIC ACID, PLASMA
Lactic Acid, Venous: 1.5 mmol/L (ref 0.5–1.9)
Lactic Acid, Venous: 1.1 mmol/L (ref 0.5–1.9)

## 2019-05-23 LAB — GLUCOSE, CAPILLARY: Glucose-Capillary: 192 mg/dL — ABNORMAL HIGH (ref 70–99)

## 2019-05-23 LAB — TROPONIN I (HIGH SENSITIVITY)
Troponin I (High Sensitivity): 9 ng/L (ref <18)
Troponin I (High Sensitivity): 8 ng/L (ref <18)

## 2019-05-23 LAB — PROCALCITONIN: Procalcitonin: 0.1 ng/mL

## 2019-05-23 LAB — FIBRINOGEN: Fibrinogen: 732 mg/dL — ABNORMAL HIGH (ref 210–475)

## 2019-05-23 LAB — MRSA PCR SCREENING: MRSA by PCR: NEGATIVE

## 2019-05-23 LAB — LACTATE DEHYDROGENASE: LDH: 414 U/L — ABNORMAL HIGH (ref 98–192)

## 2019-05-23 LAB — FERRITIN: Ferritin: 247 ng/mL (ref 11–307)

## 2019-05-23 MED ORDER — ACETAMINOPHEN 650 MG RE SUPP: 650.0000 mg | Freq: Four times a day (QID) | RECTAL | Status: DC | PRN

## 2019-05-23 MED ORDER — SODIUM CHLORIDE 0.9% FLUSH: 3.0000 mL | INTRAVENOUS | Status: DC | PRN

## 2019-05-23 MED ORDER — SODIUM CHLORIDE 0.9% FLUSH
3.0000 mL | Freq: Two times a day (BID) | INTRAVENOUS | Status: DC
  Administered 2019-05-23 – 2019-06-04 (×24): 3 mL via INTRAVENOUS

## 2019-05-23 MED ORDER — FLUTICASONE PROPIONATE HFA 110 MCG/ACT IN AERO
1.0000 | INHALATION_SPRAY | Freq: Two times a day (BID) | RESPIRATORY_TRACT | Status: DC
  Administered 2019-05-23 – 2019-06-04 (×24): 1 via RESPIRATORY_TRACT
  Filled 2019-05-23 (×2): qty 12

## 2019-05-23 MED ORDER — ONDANSETRON HCL 4 MG PO TABS
4.0000 mg | ORAL_TABLET | Freq: Four times a day (QID) | ORAL | Status: DC | PRN
  Administered 2019-05-23: 4 mg via ORAL
  Filled 2019-05-23: qty 1

## 2019-05-23 MED ORDER — SODIUM CHLORIDE 0.9 % IV SOLN
500.0000 mg | INTRAVENOUS | Status: DC
  Administered 2019-05-23 – 2019-05-24 (×2): 500 mg via INTRAVENOUS
  Filled 2019-05-23 (×2): qty 500

## 2019-05-23 MED ORDER — PANTOPRAZOLE SODIUM 40 MG PO TBEC
40.0000 mg | DELAYED_RELEASE_TABLET | Freq: Every day | ORAL | Status: DC
  Administered 2019-05-23 – 2019-06-04 (×13): 40 mg via ORAL
  Filled 2019-05-23 (×13): qty 1

## 2019-05-23 MED ORDER — ACETAMINOPHEN 325 MG PO TABS
650.0000 mg | ORAL_TABLET | Freq: Four times a day (QID) | ORAL | Status: DC | PRN
  Administered 2019-05-23 – 2019-05-24 (×2): 650 mg via ORAL
  Filled 2019-05-23 (×2): qty 2

## 2019-05-23 MED ORDER — CHLORHEXIDINE GLUCONATE CLOTH 2 % EX PADS
6.0000 | MEDICATED_PAD | Freq: Every day | CUTANEOUS | Status: DC
  Administered 2019-05-24 – 2019-05-30 (×5): 6 via TOPICAL

## 2019-05-23 MED ORDER — BISACODYL 5 MG PO TBEC
5.0000 mg | DELAYED_RELEASE_TABLET | Freq: Every day | ORAL | Status: DC | PRN
  Administered 2019-05-23: 5 mg via ORAL
  Filled 2019-05-23: qty 1

## 2019-05-23 MED ORDER — ONDANSETRON HCL 4 MG/2ML IJ SOLN: 4.0000 mg | Freq: Four times a day (QID) | INTRAMUSCULAR | Status: DC | PRN

## 2019-05-23 MED ORDER — DEXAMETHASONE SODIUM PHOSPHATE 10 MG/ML IJ SOLN
10.0000 mg | Freq: Once | INTRAMUSCULAR | Status: AC
  Administered 2019-05-23: 10 mg via INTRAVENOUS
  Filled 2019-05-23: qty 1

## 2019-05-23 MED ORDER — TOCILIZUMAB 400 MG/20ML IV SOLN
8.0000 mg/kg | Freq: Once | INTRAVENOUS | Status: AC
  Administered 2019-05-23: 534 mg via INTRAVENOUS
  Filled 2019-05-23: qty 10

## 2019-05-23 MED ORDER — SODIUM CHLORIDE 0.9 % IV SOLN
250.0000 mL | INTRAVENOUS | Status: DC | PRN
  Administered 2019-05-27: 18:00:00 250 mL via INTRAVENOUS

## 2019-05-23 MED ORDER — ENOXAPARIN SODIUM 40 MG/0.4ML ~~LOC~~ SOLN
40.0000 mg | SUBCUTANEOUS | Status: DC
  Administered 2019-05-23: 40 mg via SUBCUTANEOUS
  Filled 2019-05-23: qty 0.4

## 2019-05-23 MED ORDER — SODIUM CHLORIDE 0.9 % IV SOLN
200.0000 mg | Freq: Once | INTRAVENOUS | Status: AC
  Administered 2019-05-23: 200 mg via INTRAVENOUS
  Filled 2019-05-23: qty 40

## 2019-05-23 MED ORDER — HYDRALAZINE HCL 20 MG/ML IJ SOLN
10.0000 mg | Freq: Four times a day (QID) | INTRAMUSCULAR | Status: DC | PRN
  Administered 2019-05-23: 10 mg via INTRAVENOUS
  Filled 2019-05-23: qty 1

## 2019-05-23 MED ORDER — ACETAMINOPHEN 500 MG PO TABS
1000.0000 mg | ORAL_TABLET | Freq: Once | ORAL | Status: AC
  Administered 2019-05-23: 1000 mg via ORAL
  Filled 2019-05-23: qty 2

## 2019-05-23 MED ORDER — DEXAMETHASONE 6 MG PO TABS
6.0000 mg | ORAL_TABLET | Freq: Every day | ORAL | Status: DC
  Administered 2019-05-23 – 2019-05-30 (×8): 6 mg via ORAL
  Filled 2019-05-23: qty 1
  Filled 2019-05-23: qty 3
  Filled 2019-05-23 (×8): qty 1
  Filled 2019-05-23: qty 3

## 2019-05-23 MED ORDER — INSULIN ASPART 100 UNIT/ML ~~LOC~~ SOLN: 0.0000 [IU] | Freq: Three times a day (TID) | SUBCUTANEOUS | Status: DC

## 2019-05-23 MED ORDER — ORAL CARE MOUTH RINSE
15.0000 mL | Freq: Two times a day (BID) | OROMUCOSAL | Status: DC
  Administered 2019-05-23 – 2019-06-04 (×24): 15 mL via OROMUCOSAL

## 2019-05-23 MED ORDER — SODIUM CHLORIDE 0.9 % IV SOLN
1.0000 g | INTRAVENOUS | Status: DC
  Administered 2019-05-23: 1 g via INTRAVENOUS
  Filled 2019-05-23 (×2): qty 10

## 2019-05-23 MED ORDER — ALBUTEROL SULFATE HFA 108 (90 BASE) MCG/ACT IN AERS
2.0000 | INHALATION_SPRAY | Freq: Four times a day (QID) | RESPIRATORY_TRACT | Status: DC | PRN
  Administered 2019-05-23 (×2): 2 via RESPIRATORY_TRACT
  Filled 2019-05-23: qty 6.7

## 2019-05-23 MED ORDER — SODIUM CHLORIDE 0.9 % IV SOLN
100.0000 mg | INTRAVENOUS | Status: AC
  Administered 2019-05-24 – 2019-05-26 (×3): 100 mg via INTRAVENOUS
  Filled 2019-05-23 (×6): qty 20

## 2019-05-23 MED ORDER — GUAIFENESIN-DM 100-10 MG/5ML PO SYRP
5.0000 mL | ORAL_SOLUTION | ORAL | Status: DC | PRN
  Administered 2019-05-23 – 2019-06-02 (×17): 5 mL via ORAL
  Filled 2019-05-23 (×7): qty 10
  Filled 2019-05-23: qty 5
  Filled 2019-05-23 (×3): qty 10
  Filled 2019-05-23 (×3): qty 5
  Filled 2019-05-23 (×3): qty 10

## 2019-05-23 NOTE — ED Notes (Signed)
ED TO INPATIENT HANDOFF REPORT  Name/Age/Gender Diana Roach 68 y.o. female  Code Status   Home/SNF/Other Home  Chief Complaint covid+ shorntess of breath gi issues  Level of Care/Admitting Diagnosis ED Disposition    ED Disposition Condition [facility] Area: [place] [[id number]]  Level of Care: Stepdown [14]  Admit to SDU based on following criteria: Respiratory Distress:  Frequent assessment and/or intervention to maintain adequate ventilation/respiration, pulmonary toilet, and respiratory treatment.  Covid Evaluation: Confirmed COVID Positive  Diagnosis: COVID-19 [[id number]]  Admitting Physician: Elmarie Shiley [phone]  Attending Physician: Elmarie Shiley [phone]  Estimated length of stay: 3 - 4 days  Certification:: I certify this patient will need inpatient services for at least 2 midnights  PT Class (Do Not Modify): Inpatient [101]  PT Acc Code (Do Not Modify): Private [1]       Medical History Past Medical History:  Diagnosis Date  . Asthma   . Cataract   . Diabetes mellitus without complication ([place])   . Diverticulitis   . Environmental allergies   . Lichen planus     Allergies Allergies  Allergen Reactions  . Codeine Itching and Hives  . Minocycline Other (See Comments)    Dizziness , "strange feeling"  . Contrast Media [Iodinated Diagnostic Agents] Other (See Comments)    BP dropped  . Levaquin [Levofloxacin] Other (See Comments)    Hallucinations     IV Location/Drains/Wounds Patient Lines/Drains/Airways Status   Active Line/Drains/Airways    Name:   Placement date:   Placement time:   Site:   Days:   Peripheral IV 05/23/19 Right Forearm   05/23/19    -    Forearm   less than 1          Labs/Imaging Results for orders placed or performed during the hospital encounter of 05/23/19 (from the past 48 hour(s))  Blood Culture (routine x 2)     Status: None (Preliminary result)   Collection Time:  05/23/19  8:51 AM   Specimen: BLOOD  Result Value Ref Range   Specimen Description      BLOOD LEFT ANTECUBITAL Performed at [facility], [place] [address] [place] [address], [place], [place] [address]    Special Requests      BOTTLES DRAWN AEROBIC AND ANAEROBIC Blood Culture adequate volume Performed at [facility] [address], [place], [place] [address]    Culture PENDING    Report Status PENDING   CBC WITH DIFFERENTIAL     Status: Abnormal   Collection Time: 05/23/19  8:51 AM  Result Value Ref Range   WBC 16.0 (H) 4.0 - 10.5 K/uL   RBC 4.81 3.87 - 5.11 MIL/uL   Hemoglobin 12.5 12.0 - 15.0 g/dL   HCT 38.5 36.0 - 46.0 %   MCV 80.0 80.0 - 100.0 fL   MCH 26.0 26.0 - 34.0 pg   MCHC 32.5 30.0 - 36.0 g/dL   RDW 16.7 (H) 11.5 - 15.5 %   Platelets 317 150 - 400 K/uL   nRBC 0.0 0.0 - 0.2 %   Neutrophils Relative % 91 %   Neutro Abs 14.5 (H) 1.7 - 7.7 K/uL   Lymphocytes Relative 5 %   Lymphs Abs 0.9 0.7 - 4.0 K/uL   Monocytes Relative 3 %   Monocytes Absolute 0.4 0.1 - 1.0 K/uL   Eosinophils Relative 0 %   Eosinophils Absolute 0.0 0.0 - 0.5 K/uL   Basophils Relative 0 %  Basophils Absolute 0.0 0.0 - 0.1 K/uL   Immature Granulocytes 1 %   Abs Immature Granulocytes 0.21 (H) 0.00 - 0.07 K/uL    Comment: Performed at [facility], [place] [address], [place], [place] [address]  Comprehensive metabolic panel     Status: Abnormal   Collection Time: 05/23/19  8:51 AM  Result Value Ref Range   Sodium 135 135 - 145 mmol/L   Potassium 3.5 3.5 - 5.1 mmol/L   Chloride 102 98 - 111 mmol/L   CO2 21 (L) 22 - 32 mmol/L   Glucose, Bld 146 (H) 70 - 99 mg/dL   BUN 14 8 - 23 mg/dL   Creatinine, Ser 0.90 0.44 - 1.00 mg/dL   Calcium 8.5 (L) 8.9 - 10.3 mg/dL   Total Protein 7.7 6.5 - 8.1 g/dL   Albumin 3.5 3.5 - 5.0 g/dL   AST 49 (H) 15 - 41 U/L   ALT 43 0 - 44 U/L   Alkaline Phosphatase 58 38 - 126 U/L   Total Bilirubin 1.0 0.3 - 1.2 mg/dL   GFR calc non Af Amer >60  >60 mL/min   GFR calc Af Amer >60 >60 mL/min   Anion gap 12 5 - 15    Comment: Performed at [facility], [place] [address], [place], [place] [address]  D-dimer, quantitative     Status: Abnormal   Collection Time: 05/23/19  8:51 AM  Result Value Ref Range   D-Dimer, Quant 3.53 (H) 0.00 - 0.50 ug/mL-FEU    Comment: (NOTE) At the manufacturer cut-off of 0.50 ug/mL FEU, this assay has been documented to exclude PE with a sensitivity and negative predictive value of 97 to 99%.  At this time, this assay has not been approved by the FDA to exclude DVT/VTE. Results should be correlated with clinical presentation. Performed at [facility], [place] [address] [place], [place] [address]   Procalcitonin     Status: None   Collection Time: 05/23/19  8:51 AM  Result Value Ref Range   Procalcitonin 0.10 ng/mL    Comment:        Interpretation: PCT (Procalcitonin) <= 0.5 ng/mL: Systemic infection (sepsis) is not likely. Local bacterial infection is possible. (NOTE)       Sepsis PCT Algorithm           Lower Respiratory Tract                                      Infection PCT Algorithm    ----------------------------     ----------------------------         PCT < 0.25 ng/mL                PCT < 0.10 ng/mL         Strongly encourage             Strongly discourage   discontinuation of antibiotics    initiation of antibiotics    ----------------------------     -----------------------------       PCT 0.25 - 0.50 ng/mL            PCT 0.10 - 0.25 ng/mL               OR       >80% decrease in PCT            Discourage initiation of  antibiotics      Encourage discontinuation           of antibiotics    ----------------------------     -----------------------------         PCT >= 0.50 ng/mL              PCT 0.26 - 0.50 ng/mL               AND        <80% decrease in PCT             Encourage initiation of                                              antibiotics       Encourage continuation           of antibiotics    ----------------------------     -----------------------------        PCT >= 0.50 ng/mL                  PCT > 0.50 ng/mL               AND         increase in PCT                  Strongly encourage                                      initiation of antibiotics    Strongly encourage escalation           of antibiotics                                     -----------------------------                                           PCT <= 0.25 ng/mL                                                 OR                                        > 80% decrease in PCT                                     Discontinue / Do not initiate                                             antibiotics Performed at [place] [address] [place], [place] [address]   Lactate dehydrogenase     Status: Abnormal   Collection Time: 05/23/19  8:51 AM  Result Value Ref Range   LDH 414 (H) 98 - 192 U/L    Comment: Performed at [facility], [place] [address], [place], [place] [address]  Ferritin     Status: None   Collection Time: 05/23/19  8:51 AM  Result Value Ref Range   Ferritin 247 11 - 307 ng/mL    Comment: Performed at [facility], [place] [address], [place], [place] [address]  Triglycerides     Status: None   Collection Time: 05/23/19  8:51 AM  Result Value Ref Range   Triglycerides 90 <150 mg/dL    Comment: Performed at [facility], [place] [address], [place], [place] [address]  Fibrinogen     Status: Abnormal   Collection Time: 05/23/19  8:51 AM  Result Value Ref Range   Fibrinogen 732 (H) 210 - 475 mg/dL    Comment: Performed at [facility], [place] [address], [place], [place] [address]  C-reactive protein     Status: Abnormal   Collection Time: 05/23/19  8:51 AM  Result Value Ref Range   CRP 15.5 (H) <1.0 mg/dL     Comment: Performed at [facility], [place] [address], [place], [place] [address]  Lactic acid, plasma     Status: None   Collection Time: 05/23/19  9:17 AM  Result Value Ref Range   Lactic Acid, Venous 1.5 0.5 - 1.9 mmol/L    Comment: Performed at [facility], [place] [address] [place], [place] [address]   Dg Chest Port 1 View  Result Date: 05/23/2019 CLINICAL DATA:  COVID, shortness of breath. Additional history: Patient complains of shortness of breath, cough, central chest pain, mid back pain and nausea since July 18th. EXAM: PORTABLE CHEST 1 VIEW COMPARISON:  Chest radiograph 05/19/2019. FINDINGS: The heart is normal in size. Hazy opacities at the bilateral lung bases, as well as a new ill-defined opacity within the left lower lung. No evidence of pleural effusion or pneumothorax. No acute bony abnormality IMPRESSION: Hazy bibasilar opacities as well as new ill-defined opacity in the left lower lung. Given provided history, findings are suspicious for developing pneumonia. Electronically Signed   By: Kellie Simmering   On: 05/23/2019 10:41    Pending Labs Unresulted Labs (From admission, onward)    Start     Ordered   05/24/19 0500  C-reactive protein  Daily,   R     05/23/19 1209   05/24/19 0500  D-dimer, quantitative (not at [facility])  Daily,   R     05/23/19 1209   05/24/19 0500  Ferritin  Daily,   R     05/23/19 1209   05/23/19 1152  Hepatitis B surface antigen  Once,   STAT     05/23/19 1151   05/23/19 0851  Lactic acid, plasma  Now then every 2 hours,   STAT     05/23/19 0850   05/23/19 0851  Blood Culture (routine x 2)  BLOOD CULTURE X 2,   STAT     05/23/19 0850   Signed and Held  HIV antibody (Routine Testing)  Once,   R     Signed and Held   Signed and Held  Creatinine, serum  (enoxaparin (LOVENOX)    CrCl >/= 30 ml/min)  Weekly,   R    Comments: while on enoxaparin therapy    Signed and Held   Signed and Held  Comprehensive metabolic  panel  Tomorrow morning,   R     Signed and Held          Vitals/Pain Today's Vitals   05/23/19 1130 05/23/19 1145 05/23/19 1200 05/23/19 1315  BP: 138/77  (!) 148/83 (!) 155/95  Pulse: 69 80 79 73  Resp: (!) 21 19 (!) 22 (!) 21  Temp:      TempSrc:      SpO2: 93% 92% 91% 91%  PainSc:        Isolation Precautions Airborne and Contact precautions  Medications Medications  tocilizumab (ACTEMRA) 8 mg/kg = 534 mg in sodium chloride 0.9 % 100 mL infusion (has no administration in time range)  dexamethasone (DECADRON) tablet 6 mg (has no administration in time range)  pantoprazole (PROTONIX) EC tablet 40 mg (40 mg Oral Given 05/23/19 1358)  cefTRIAXone (ROCEPHIN) 1 g in sodium chloride 0.9 % 100 mL IVPB (0 g Intravenous Stopped 05/23/19 1358)  azithromycin (ZITHROMAX) 500 mg in sodium chloride 0.9 % 250 mL IVPB (500 mg Intravenous New Bag/Given 05/23/19 1403)  remdesivir 200 mg in sodium chloride 0.9 % 250 mL IVPB (has no administration in time range)    Followed by  remdesivir 100 mg in sodium chloride 0.9 % 250 mL IVPB (has no administration in time range)  acetaminophen (TYLENOL) tablet 1,000 mg (1,000 mg Oral Given 05/23/19 0914)  dexamethasone (DECADRON) injection 10 mg (10 mg Intravenous Given 05/23/19 0938)    Mobility walks

## 2019-05-23 NOTE — ED Notes (Signed)
Pt's O2 sats decreased to 89-90%.  Pt's O2 increased from 3 to 4 L [place].  Pt now at 93%

## 2019-05-23 NOTE — H&P (Signed)
History and Physical  Diana Roach TSV:[id number] DOB: 05-25-1951 DOA: 05/23/2019  PCP: Leeanne Rio, MD Patient coming from: Home   I have personally briefly reviewed patient's old medical records in [place]   Chief Complaint: Shortness of breath  HPI: Diana Roach is a 68 y.o. female with past medical history significant for asthma, diabetes, recent diagnosis of COVID-19 on July 23 presents complaining of worsening shortness of breath.  Patient had a telehealth medicine visit with her primary care physician, teaching service for possible exposure to COVID-19.  She was sent to have a test the [facility] and she tested positive.  On July 25 she presented to the ED complaining of shortness of breath, she was felt to be stable and was discharged home on albuterol and prednisone. Patient presents  today July 29 with worsening shortness of breath, worsening cough, she was found to be febrile with temperature 102, hypoxic oxygen sat 80 on room air subsequently improved to 94 on 3 4 L of oxygen.  See report chest pain after she cough.  She reported mild abdominal soreness after she coughed.  She denies diarrhea.  Evaluation in the ED: Sodium 135, potassium 3.5, CO2 21, BUN 14, creatinine 0.9, calcium 8.5, AST 49, LDH 414, ferritin 247 CRP 15 procalcitonin 0.1 white blood cell 16 lactic acid 1.1.  D-dimer 3.5, fibrinogen 732.  Chest x-ray: Hazy bibasilar opacities as well as new ill-defined opacity in the left lower lung. Given provided history, findings are suspicious for developing pneumonia  Review of Systems: All systems reviewed and apart from history of presenting illness, are negative.  Past Medical History:  Diagnosis Date  . Asthma   . Cataract   . Diabetes mellitus without complication ([place])   . Diverticulitis   . Environmental allergies   . Lichen planus    Past Surgical History:  Procedure Laterality Date  . APPENDECTOMY  1969  . BACK SURGERY   1986   lumbar spine  . CARPAL TUNNEL RELEASE Bilateral   . COLONOSCOPY  2015   Social History:  reports that she quit smoking about 24 years ago. She has never used smokeless tobacco. She reports that she does not drink alcohol or use drugs.   Allergies  Allergen Reactions  . Codeine Itching and Hives  . Minocycline Other (See Comments)    Dizziness , "strange feeling"  . Contrast Media [Iodinated Diagnostic Agents] Other (See Comments)    BP dropped  . Levaquin [Levofloxacin] Other (See Comments)    Hallucinations     Family History  Problem Relation Age of Onset  . Hypothyroidism Sister   . Cervical cancer Sister   . Fibromyalgia Sister   . Heart disease Mother   . Hypertension Brother   . Lichen planus Brother   . Cancer Brother        lung  . Heart attack Brother   . Diabetes Daughter   . Cancer Daughter   . Colon cancer Neg Hx   . Pancreatic cancer Neg Hx   . Stomach cancer Neg Hx   . Esophageal cancer Neg Hx   . Breast cancer Neg Hx     Prior to Admission medications   Medication Sig Start Date End Date Taking? Authorizing Provider  acetaminophen (TYLENOL) 325 MG tablet Take 650 mg by mouth every 6 (six) hours as needed for mild pain or headache.   Yes [provider]  albuterol (PROVENTIL HFA) 108 (90 Base) MCG/ACT  inhaler Inhale 2 puffs into the lungs every 6 (six) hours as needed for wheezing or shortness of breath. 11/30/18  Yes Leeanne Rio, MD  fluticasone (FLOVENT HFA) 110 MCG/ACT inhaler Inhale 1 puff into the lungs 2 (two) times daily. 11/24/18  Yes Leeanne Rio, MD  predniSONE (DELTASONE) 20 MG tablet 3 tabs po day one, then 2 po daily x 4 days Patient taking differently: Take 40-60 mg by mouth as directed. 60mg  po day one, then 40mg  po daily x 4 days 05/19/19  Yes Julianne Rice, MD  benzonatate (TESSALON) 100 MG capsule Take 1 capsule (100 mg total) by mouth 3 (three) times daily as needed for cough. Patient not taking: Reported  on 05/23/2019 05/19/19   Julianne Rice, MD  ondansetron [facility] ODT) 4 MG disintegrating tablet 4mg  ODT q4 hours prn nausea/vomit Patient not taking: Reported on 05/23/2019 05/19/19   Julianne Rice, MD   Physical Exam: Vitals:   05/23/19 0840 05/23/19 0841 05/23/19 0900 05/23/19 1000  BP:   140/85 133/89  Pulse:   80 87  Resp:   (!) 22 (!) 26  Temp:      TempSrc:      SpO2: (!) 88% 92% 93% 92%     General exam: Moderately built and nourished patient, lying comfortably supine on the gurney in mild  distress.  Head, eyes and ENT: Nontraumatic and normocephalic. Pupils equally reacting to light and accommodation. Oral mucosa moist.  Neck: Supple. No JVD, carotid bruit or thyromegaly.  Lymphatics: No lymphadenopathy.  Respiratory system: Mild tachypnea, bilateral rhonchorous  Cardiovascular system: S1 and S2 heard, RRR. No JVD, murmurs, gallops, clicks or pedal edema.  Gastrointestinal system: Abdomen is nondistended, soft and nontender. Normal bowel sounds heard. No organomegaly or masses appreciated.  Central nervous system: Alert and oriented. No focal neurological deficits.  Extremities: Symmetric 5 x 5 power. Peripheral pulses symmetrically felt.   Skin: No rashes or acute findings.  Musculoskeletal system: Negative exam.  Psychiatry: Pleasant and cooperative.   Labs on Admission:  Basic Metabolic Panel: Recent Labs  Lab 05/19/19 2023 05/23/19 0851  NA 135 135  K 4.2 3.5  CL 102 102  CO2 23 21*  GLUCOSE 142* 146*  BUN 8 14  CREATININE 0.92 0.90  CALCIUM 8.5* 8.5*   Liver Function Tests: Recent Labs  Lab 05/19/19 2023 05/23/19 0851  AST 57* 49*  ALT 35 43  ALKPHOS 67 58  BILITOT 0.8 1.0  PROT 7.5 7.7  ALBUMIN 3.8 3.5   No results for input(s): LIPASE, AMYLASE in the last 168 hours. No results for input(s): AMMONIA in the last 168 hours. CBC: Recent Labs  Lab 05/19/19 2023 05/23/19 0851  WBC 7.9 16.0*  NEUTROABS 5.7 14.5*  HGB 13.7 12.5   HCT 43.8 38.5  MCV 81.6 80.0  PLT 246 317   Cardiac Enzymes: No results for input(s): CKTOTAL, CKMB, CKMBINDEX, TROPONINI in the last 168 hours.  BNP (last 3 results) No results for input(s): PROBNP in the last 8760 hours. CBG: No results for input(s): GLUCAP in the last 168 hours.  Radiological Exams on Admission: Dg Chest Port 1 View  Result Date: 05/23/2019 CLINICAL DATA:  COVID, shortness of breath. Additional history: Patient complains of shortness of breath, cough, central chest pain, mid back pain and nausea since July 18th. EXAM: PORTABLE CHEST 1 VIEW COMPARISON:  Chest radiograph 05/19/2019. FINDINGS: The heart is normal in size. Hazy opacities at the bilateral lung bases, as well as a new ill-defined  opacity within the left lower lung. No evidence of pleural effusion or pneumothorax. No acute bony abnormality IMPRESSION: Hazy bibasilar opacities as well as new ill-defined opacity in the left lower lung. Given provided history, findings are suspicious for developing pneumonia. Electronically Signed   By: Kellie Simmering   On: 05/23/2019 10:41    EKG: Independently reviewed.  It is tachycardia  Assessment/Plan Active Problems:   Asthma, moderate persistent   Type 2 diabetes mellitus without complication, without long-term current use of insulin (HCC)   Pneumonia due to COVID-19 virus   Respiratory failure, acute (HCC)   1-Acute hypoxic respiratory failure secondary to COVID-19 pneumonia: Admit to the stepdown unit.  -Patient presented with hypoxemia requiring 3- 4 L of oxygen to keep sat above 94, chest x-ray with bilateral infiltrates.  I have discussed with patient and she agree with starting Remdesivir.  -Patient also  qualify for Actemra, CRP more than 7 currently at 15, hypoxemia.  I have started Actemra, (one dose)  discussed with patient.  Hepatitis B antigen ordered. -Dexamethasone 6 mg daily orally. -Prone position. -Albuterol inhale -Due  leukocytosis will  cover  with ceftriaxone and azithromycin.  2-Diabetes type II: Sliding scale insulin.  3-Asthma; on a steroid, albuterol.   DVT Prophylaxis: Lovenox Code Status: Full code Family Communication: Daughter over the phone Disposition Plan: Admit to the Step Down  [facility], no bed available at [facility].  Consider to transfer to the [facility] when bed becomes available  Time spent: 75 minutes.  Given the aforementioned, the predictability of an adverse outcome is felt to be significant. I expect that the patient will require at least 2 midnights in the hospital to treat this condition.  Elmarie Shiley MD [facility]   05/23/2019, 11:28 AM

## 2019-05-23 NOTE — ED Triage Notes (Addendum)
Pt c/o SOB, cough, central chest pain, mid back pain, and nausea since 7/18.  Covid + on 7/21.  Started on prednisone on 7/25.  Sts she is unable to tolerate medication.  Pain score 10/10.  Hasn't taken any medication today.     Pt sts new headache started this morning.

## 2019-05-23 NOTE — ED Notes (Signed)
Pt significantly more comfortable after placing O2.

## 2019-05-23 NOTE — ED Provider Notes (Signed)
[facility] DEPT Provider Note   CSN: [id number] Arrival date & time: 05/23/19  0830    History   Chief Complaint Chief Complaint  Patient presents with  . Covid +  . Shortness of Breath  . Nausea    HPI Diana Roach is a 68 y.o. female.     The history is provided by the patient.  Shortness of Breath Severity:  Moderate Onset quality:  Gradual Timing:  Constant Progression:  Worsening Chronicity:  New Context comment:  COVID positive with asthma hx Relieved by:  Inhaler Worsened by:  Activity and exertion Associated symptoms: cough and fever   Associated symptoms: no abdominal pain, no chest pain, no ear pain, no rash, no sore throat and no vomiting     Past Medical History:  Diagnosis Date  . Asthma   . Cataract   . Diabetes mellitus without complication ([place])   . Diverticulitis   . Environmental allergies   . Lichen planus     Patient Active Problem List   Diagnosis Date Noted  . Pneumonia due to COVID-19 virus 05/23/2019  . Respiratory failure, acute ([place]) 05/23/2019  . COVID-19 05/23/2019  . Palpitations 02/21/2019  . Type 2 diabetes mellitus without complication, without long-term current use of insulin ([place]) 10/24/2017  . Varicose vein of leg 03/09/2016  . UPJ obstruction, congenital 01/28/2016  . Lumbosacral radiculopathy 01/27/2016  . Diverticulosis 10/01/2014  . Radiculopathy, cervical 09/04/2014  . Bilateral finger numbness 09/04/2014  . Abdominal pain 04/24/2014  . Asthma, moderate persistent 05/13/2011  . Lichen planus 52/84/1324  . 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 HYPERLIPIDEMIA 12/22/2006    Past Surgical History:  Procedure Laterality Date  . APPENDECTOMY  1969  . BACK SURGERY  1986   lumbar spine  . CARPAL TUNNEL RELEASE Bilateral   . COLONOSCOPY  2015     OB History   No obstetric history on file.      Home Medications    Prior to Admission medications   Medication Sig Start Date  End Date Taking? Authorizing Provider  acetaminophen (TYLENOL) 325 MG tablet Take 650 mg by mouth every 6 (six) hours as needed for mild pain or headache.   Yes [provider]  albuterol (PROVENTIL HFA) 108 (90 Base) MCG/ACT inhaler Inhale 2 puffs into the lungs every 6 (six) hours as needed for wheezing or shortness of breath. 11/30/18  Yes Leeanne Rio, MD  fluticasone (FLOVENT HFA) 110 MCG/ACT inhaler Inhale 1 puff into the lungs 2 (two) times daily. 11/24/18  Yes Leeanne Rio, MD  predniSONE (DELTASONE) 20 MG tablet 3 tabs po day one, then 2 po daily x 4 days Patient taking differently: Take 40-60 mg by mouth as directed. 60mg  po day one, then 40mg  po daily x 4 days 05/19/19  Yes Julianne Rice, MD  benzonatate (TESSALON) 100 MG capsule Take 1 capsule (100 mg total) by mouth 3 (three) times daily as needed for cough. Patient not taking: Reported on 05/23/2019 05/19/19   Julianne Rice, MD  ondansetron (ZOFRAN ODT) 4 MG disintegrating tablet 4mg  ODT q4 hours prn nausea/vomit Patient not taking: Reported on 05/23/2019 05/19/19   Julianne Rice, MD    Family History Family History  Problem Relation Age of Onset  . Hypothyroidism Sister   . Cervical cancer Sister   . Fibromyalgia Sister   . Heart disease Mother   . Hypertension Brother   . Lichen planus Brother   . Cancer Brother  lung  . Heart attack Brother   . Diabetes Daughter   . Cancer Daughter   . Colon cancer Neg Hx   . Pancreatic cancer Neg Hx   . Stomach cancer Neg Hx   . Esophageal cancer Neg Hx   . Breast cancer Neg Hx     Social History Social History   Tobacco Use  . Smoking status: Former Smoker    Quit date: 10/25/1994    Years since quitting: 24.5  . Smokeless tobacco: Never Used  . Tobacco comment: no plans to start again  Substance Use Topics  . Alcohol use: No  . Drug use: No     Allergies   Codeine, Minocycline, Contrast media [iodinated diagnostic agents], and Levaquin  [levofloxacin]   Review of Systems Review of Systems  Constitutional: Positive for fever. Negative for chills.  HENT: Negative for ear pain and sore throat.   Eyes: Negative for pain and visual disturbance.  Respiratory: Positive for cough and shortness of breath.   Cardiovascular: Negative for chest pain and palpitations.  Gastrointestinal: Negative for abdominal pain and vomiting.  Genitourinary: Negative for dysuria and hematuria.  Musculoskeletal: Negative for arthralgias and back pain.  Skin: Negative for color change and rash.  Neurological: Negative for seizures and syncope.  All other systems reviewed and are negative.    Physical Exam Updated Vital Signs BP 133/89   Pulse 87   Temp (!) 102.8 F (39.3 C) (Rectal)   Resp (!) 26   SpO2 92%   Physical Exam Vitals signs and nursing note reviewed.  Constitutional:      General: She is not in acute distress.    Appearance: She is well-developed. She is ill-appearing.  HENT:     Head: Normocephalic and atraumatic.  Eyes:     Extraocular Movements: Extraocular movements intact.     Conjunctiva/sclera: Conjunctivae normal.  Neck:     Musculoskeletal: Normal range of motion and neck supple.  Cardiovascular:     Rate and Rhythm: Normal rate and regular rhythm.     Pulses: Normal pulses.     Heart sounds: Normal heart sounds. No murmur.  Pulmonary:     Effort: Pulmonary effort is normal. Tachypnea present. No respiratory distress.     Breath sounds: Decreased breath sounds and wheezing (mild) present. No rhonchi.  Abdominal:     Palpations: Abdomen is soft.     Tenderness: There is no abdominal tenderness.  Musculoskeletal: Normal range of motion.     Right lower leg: No edema.     Left lower leg: No edema.  Skin:    General: Skin is warm and dry.     Capillary Refill: Capillary refill takes less than 2 seconds.  Neurological:     General: No focal deficit present.     Mental Status: She is alert.  Psychiatric:         Mood and Affect: Mood normal.      ED Treatments / Results  Labs (all labs ordered are listed, but only abnormal results are displayed) Labs Reviewed  CBC WITH DIFFERENTIAL/PLATELET - Abnormal; Notable for the following components:      Result Value   WBC 16.0 (*)    RDW 16.7 (*)    Neutro Abs 14.5 (*)    Abs Immature Granulocytes 0.21 (*)    All other components within normal limits  COMPREHENSIVE METABOLIC PANEL - Abnormal; Notable for the following components:   CO2 21 (*)    Glucose, Bld 146 (*)  Calcium 8.5 (*)    AST 49 (*)    All other components within normal limits  D-DIMER, QUANTITATIVE (NOT AT [facility]) - Abnormal; Notable for the following components:   D-Dimer, Quant 3.53 (*)    All other components within normal limits  LACTATE DEHYDROGENASE - Abnormal; Notable for the following components:   LDH 414 (*)    All other components within normal limits  FIBRINOGEN - Abnormal; Notable for the following components:   Fibrinogen 732 (*)    All other components within normal limits  C-REACTIVE PROTEIN - Abnormal; Notable for the following components:   CRP 15.5 (*)    All other components within normal limits  CULTURE, BLOOD (ROUTINE X 2)  CULTURE, BLOOD (ROUTINE X 2)  LACTIC ACID, PLASMA  PROCALCITONIN  FERRITIN  TRIGLYCERIDES  LACTIC ACID, PLASMA    EKG EKG Interpretation  Date/Time:  Wednesday May 23 2019 08:37:55 EDT Ventricular Rate:  100 PR Interval:    QRS Duration: 78 QT Interval:  355 QTC Calculation: 458 R Axis:   42 Text Interpretation:  Sinus tachycardia Confirmed by Lennice Sites [phone]) on 05/23/2019 8:44:03 AM   Radiology Dg Chest Port 1 View  Result Date: 05/23/2019 CLINICAL DATA:  COVID, shortness of breath. Additional history: Patient complains of shortness of breath, cough, central chest pain, mid back pain and nausea since July 18th. EXAM: PORTABLE CHEST 1 VIEW COMPARISON:  Chest radiograph 05/19/2019. FINDINGS: The heart is  normal in size. Hazy opacities at the bilateral lung bases, as well as a new ill-defined opacity within the left lower lung. No evidence of pleural effusion or pneumothorax. No acute bony abnormality IMPRESSION: Hazy bibasilar opacities as well as new ill-defined opacity in the left lower lung. Given provided history, findings are suspicious for developing pneumonia. Electronically Signed   By: Kellie Simmering   On: 05/23/2019 10:41    Procedures .Critical Care Performed by: Lennice Sites, DO Authorized by: Lennice Sites, DO   Critical care provider statement:    Critical care time (minutes):  40   Critical care was necessary to treat or prevent imminent or life-threatening deterioration of the following conditions:  Respiratory failure   Critical care was time spent personally by me on the following activities:  Blood draw for specimens, development of treatment plan with patient or surrogate, discussions with primary provider, evaluation of patient's response to treatment, examination of patient, ordering and performing treatments and interventions, ordering and review of laboratory studies, ordering and review of radiographic studies, pulse oximetry, review of old charts and re-evaluation of patient's condition   I assumed direction of critical care for this patient from another provider in my specialty: no     (including critical care time)  Medications Ordered in ED Medications  acetaminophen (TYLENOL) tablet 1,000 mg (1,000 mg Oral Given 05/23/19 0914)  dexamethasone (DECADRON) injection 10 mg (10 mg Intravenous Given 05/23/19 0938)     Initial Impression / Assessment and Plan / ED Course  I have reviewed the triage vital signs and the nursing notes.  Pertinent labs & imaging results that were available during my care of the patient were reviewed by me and considered in my medical decision making (see chart for details).        Diana Roach is a 68 year old female with history  of asthma, diabetes who presents the ED with shortness of breath, hypoxia.  Recently tested positive for coronavirus about a week ago.  Coarse breath sounds on exam.  Patient is  febrile, tachypneic, hypoxic.  Likely progression of coronavirus.  Will give IV Decadron.  Coronavirus labs were ordered.  Chest x-ray consistent with likely viral process.  No major effusion or pneumothorax.  Lactic acid is normal.  White count 16.  Blood cultures have been collected.  However will hold antibiotics as likely this is all from coronavirus.  Patient with increased inflammatory markers including LDH, d-dimer, fibrinogen, CRP.  Patient however stable on 3 L of oxygen.  To be admitted to hospital service for further coronavirus care.  This chart was dictated using voice recognition software.  Despite best efforts to proofread,  errors can occur which can change the documentation meaning.    Final Clinical Impressions(s) / ED Diagnoses   Final diagnoses:  Acute respiratory failure with hypoxia ([place])  COVID-19 virus detected    ED Discharge Orders    None       Lennice Sites, DO 05/23/19 1153

## 2019-05-23 NOTE — ED Notes (Signed)
Pure wick has been placed. Suction set to 45mmHg.  

## 2019-05-24 LAB — COMPREHENSIVE METABOLIC PANEL
ALT: 36 U/L (ref 0–44)
AST: 33 U/L (ref 15–41)
Albumin: 2.9 g/dL — ABNORMAL LOW (ref 3.5–5.0)
Alkaline Phosphatase: 54 U/L (ref 38–126)
Anion gap: 11 (ref 5–15)
BUN: 16 mg/dL (ref 8–23)
CO2: 19 mmol/L — ABNORMAL LOW (ref 22–32)
Calcium: 8 mg/dL — ABNORMAL LOW (ref 8.9–10.3)
Chloride: 109 mmol/L (ref 98–111)
Creatinine, Ser: 0.83 mg/dL (ref 0.44–1.00)
GFR calc Af Amer: >60 mL/min (ref >60)
GFR calc non Af Amer: >60 mL/min (ref >60)
Glucose, Bld: 204 mg/dL — ABNORMAL HIGH (ref 70–99)
Potassium: 3.7 mmol/L (ref 3.5–5.1)
Sodium: 139 mmol/L (ref 135–145)
Total Bilirubin: 0.4 mg/dL (ref 0.3–1.2)
Total Protein: 6.8 g/dL (ref 6.5–8.1)

## 2019-05-24 LAB — HIV ANTIBODY (ROUTINE TESTING W REFLEX): HIV Screen 4th Generation wRfx: NONREACTIVE

## 2019-05-24 LAB — GLUCOSE, CAPILLARY
Glucose-Capillary: 147 mg/dL — ABNORMAL HIGH (ref 70–99)
Glucose-Capillary: 120 mg/dL — ABNORMAL HIGH (ref 70–99)
Glucose-Capillary: 165 mg/dL — ABNORMAL HIGH (ref 70–99)
Glucose-Capillary: 163 mg/dL — ABNORMAL HIGH (ref 70–99)

## 2019-05-24 LAB — D-DIMER, QUANTITATIVE: D-Dimer, Quant: 7.4 ug/mL-FEU — ABNORMAL HIGH (ref 0.00–0.50)

## 2019-05-24 LAB — FERRITIN: Ferritin: 242 ng/mL (ref 11–307)

## 2019-05-24 LAB — C-REACTIVE PROTEIN: CRP: 17.1 mg/dL — ABNORMAL HIGH (ref <1.0)

## 2019-05-24 LAB — HEMOGLOBIN A1C
Hgb A1c MFr Bld: 7.4 % — ABNORMAL HIGH (ref 4.8–5.6)
Mean Plasma Glucose: 165.68 mg/dL

## 2019-05-24 LAB — HEPATITIS B SURFACE ANTIGEN: Hepatitis B Surface Ag: NEGATIVE

## 2019-05-24 MED ORDER — ACETAMINOPHEN 325 MG PO TABS
650.0000 mg | ORAL_TABLET | Freq: Four times a day (QID) | ORAL | Status: DC | PRN
  Administered 2019-05-24 – 2019-05-30 (×7): 650 mg via ORAL
  Filled 2019-05-24 (×8): qty 2

## 2019-05-24 MED ORDER — INSULIN ASPART 100 UNIT/ML ~~LOC~~ SOLN
0.0000 [IU] | Freq: Three times a day (TID) | SUBCUTANEOUS | Status: DC
  Administered 2019-05-24 (×2): 3 [IU] via SUBCUTANEOUS
  Administered 2019-05-25: 5 [IU] via SUBCUTANEOUS
  Administered 2019-05-25: 3 [IU] via SUBCUTANEOUS
  Administered 2019-05-26: 17:00:00 2 [IU] via SUBCUTANEOUS
  Administered 2019-05-27 (×2): 3 [IU] via SUBCUTANEOUS
  Administered 2019-05-27: 18:00:00 8 [IU] via SUBCUTANEOUS
  Administered 2019-05-28: 12:00:00 3 [IU] via SUBCUTANEOUS
  Administered 2019-05-28: 09:00:00 2 [IU] via SUBCUTANEOUS
  Administered 2019-05-28: 17:00:00 5 [IU] via SUBCUTANEOUS
  Administered 2019-05-29: 13:00:00 3 [IU] via SUBCUTANEOUS
  Administered 2019-05-29 – 2019-05-30 (×3): 5 [IU] via SUBCUTANEOUS
  Administered 2019-05-31: 08:00:00 3 [IU] via SUBCUTANEOUS
  Administered 2019-05-31 – 2019-06-01 (×3): 5 [IU] via SUBCUTANEOUS
  Administered 2019-06-01 (×2): 2 [IU] via SUBCUTANEOUS
  Administered 2019-06-02 (×2): 5 [IU] via SUBCUTANEOUS
  Administered 2019-06-02: 09:00:00 2 [IU] via SUBCUTANEOUS
  Administered 2019-06-03 – 2019-06-04 (×3): 3 [IU] via SUBCUTANEOUS

## 2019-05-24 MED ORDER — ALBUTEROL SULFATE HFA 108 (90 BASE) MCG/ACT IN AERS
2.0000 | INHALATION_SPRAY | RESPIRATORY_TRACT | Status: DC | PRN
  Administered 2019-05-27 – 2019-06-01 (×7): 2 via RESPIRATORY_TRACT

## 2019-05-24 MED ORDER — ENOXAPARIN SODIUM 40 MG/0.4ML ~~LOC~~ SOLN
40.0000 mg | Freq: Two times a day (BID) | SUBCUTANEOUS | Status: DC
  Administered 2019-05-24 – 2019-05-25 (×4): 40 mg via SUBCUTANEOUS
  Filled 2019-05-24 (×5): qty 0.4

## 2019-05-24 MED ORDER — AZITHROMYCIN 250 MG PO TABS: 500.0000 mg | ORAL_TABLET | Freq: Every day | ORAL | Status: DC

## 2019-05-24 MED ORDER — SODIUM CHLORIDE 0.9 % IV SOLN
1.0000 g | INTRAVENOUS | Status: DC
  Filled 2019-05-24: qty 10

## 2019-05-24 NOTE — Progress Notes (Signed)
Patient arrived to [facility] on nonrebreather mask.  Changed to HFNC at 15L pt saturating 92%.  A/O, VSS.  Physician paged for patient's arrival. Will continue to monitor closely.

## 2019-05-24 NOTE — Progress Notes (Addendum)
PROGRESS NOTE    Diana Roach  YKZ:[id number] DOB: 05-Jun-1951 DOA: 05/23/2019 PCP: Leeanne Rio, MD   Brief Narrative:  Diana Roach is a 68 y.o. female with past medical history significant for asthma, diabetes, recent diagnosis of COVID-19 on July 23 presents complaining of worsening shortness of breath.  Patient had a telehealth medicine visit with her primary care physician, teaching service for possible exposure to COVID-19.  She was sent to have a test the [facility] and she tested positive.  On July 25 she presented to the ED complaining of shortness of breath, she was felt to be stable and was discharged home on albuterol and prednisone. Patient presents  today July 29 with worsening shortness of breath, worsening cough, she was found to be febrile with temperature 102, hypoxic oxygen sat 80 on room air subsequently improved to 94 on 3 4 L of oxygen.  See report chest pain after she cough.  She reported mild abdominal soreness after she coughed.  She denies diarrhea.  Evaluation in the ED: Sodium 135, potassium 3.5, CO2 21, BUN 14, creatinine 0.9, calcium 8.5, AST 49, LDH 414, ferritin 247 CRP 15 procalcitonin 0.1 white blood cell 16 lactic acid 1.1.  D-dimer 3.5, fibrinogen 732.  Chest x-ray: Hazy bibasilar opacities as well as new ill-defined opacity in the left lower lung. Given provided history, findings are suspicious for developing pneumonia  Assessment & Plan:   Principal Problem:   Pneumonia due to COVID-19 virus Active Problems:   Asthma, moderate persistent   Type 2 diabetes mellitus without complication, without long-term current use of insulin (HCC)   Respiratory failure, acute ([place])   COVID-19  1-Acute hypoxic respiratory failure secondary to COVID-19 pneumonia: - progressive hypoxia, initially requiring 3-4 L O2 to keep sats above 94.  Today, pt requiring high flow at 12 L.  - discussed case informally with PCCM who reviewed case and noted as  pt appears comfortable, ok to continue high flow, proning - CXR 7/29 with hazy bibasilar opacities with ill defined opacity in LLL - s/p actemra - continue daily dexamethasone 6 mg - continue remdesivir, dosing per pharmacy - will d/c ceftriaxone/azithromycin PCT is 0.10 and pt leukocytosis at presentation likely related to steroids she was taking prior to presentation - Follow inflammatory markers, worsening today with worsening d dimer -> increase to BID lovenox dosing for DVT ppx - prone as tolerated - prn inhalers - I/O, daily weights, goal euvolemic to dry  2-Diabetes type II: Sliding scale insulin. A1c is 7.4.    3-Asthma; on a steroid, albuterol.   DVT prophylaxis: lovenox BID Code Status: full Family Communication: called daughter Disposition Plan: transfer to [facility] when bed available  Consultants:   PCCM informally   Procedures:   none  Antimicrobials:  Anti-infectives (From admission, onward)   Start     Dose/Rate Route Frequency Ordered Stop   05/25/19 1000  azithromycin (ZITHROMAX) tablet 500 mg  Status:  Discontinued     500 mg Oral Daily 05/24/19 0955 05/24/19 1218   05/24/19 1400  remdesivir 100 mg in sodium chloride 0.9 % 250 mL IVPB     100 mg 500 mL/hr over 30 Minutes Intravenous Every 24 hours 05/23/19 1224 05/28/19 1359   05/24/19 1300  cefTRIAXone (ROCEPHIN) 1 g in sodium chloride 0.9 % 100 mL IVPB  Status:  Discontinued     1 g 200 mL/hr over 30 Minutes Intravenous Every 24 hours 05/24/19 0955 05/24/19 1218   05/23/19  1400  remdesivir 200 mg in sodium chloride 0.9 % 250 mL IVPB     200 mg 500 mL/hr over 30 Minutes Intravenous Once 05/23/19 1224 05/23/19 1658   05/23/19 1230  cefTRIAXone (ROCEPHIN) 1 g in sodium chloride 0.9 % 100 mL IVPB  Status:  Discontinued     1 g 200 mL/hr over 30 Minutes Intravenous Every 24 hours 05/23/19 1207 05/24/19 0953   05/23/19 1230  azithromycin (ZITHROMAX) 500 mg in sodium chloride 0.9 % 250 mL IVPB  Status:   Discontinued     500 mg 250 mL/hr over 60 Minutes Intravenous Every 24 hours 05/23/19 1207 05/24/19 0953      Subjective: C/o cough, shortness of breath  Objective: Vitals:   05/24/19 0600 05/24/19 0800 05/24/19 1000 05/24/19 1100  BP: 129/69 135/68 127/78   Pulse:   76 (!) 56  Resp: 14 19 20 19   Temp:  98 F (36.7 C)    TempSrc:  Oral    SpO2:   92% 94%  Weight:      Height:        Intake/Output Summary (Last 24 hours) at 05/24/2019 1205 Last data filed at 05/24/2019 1100 Gross per 24 hour  Intake 1569.65 ml  Output 1400 ml  Net 169.65 ml   Filed Weights   05/23/19 1442  Weight: 64.9 kg    Examination:  General exam: Appears calm and comfortable  Respiratory system: increased wob, frequent cough  Gastrointestinal system: Abdomen is nondistended, soft and nontender.  Central nervous system: Alert and oriented. No focal neurological deficits. Extremities: no LEE Skin: No rashes, lesions or ulcers Psychiatry: Judgement and insight appear normal. Mood & affect appropriate.     Data Reviewed: I have personally reviewed following labs and imaging studies  CBC: Recent Labs  Lab 05/19/19 2023 05/23/19 0851  WBC 7.9 16.0*  NEUTROABS 5.7 14.5*  HGB 13.7 12.5  HCT 43.8 38.5  MCV 81.6 80.0  PLT 246 476   Basic Metabolic Panel: Recent Labs  Lab 05/19/19 2023 05/23/19 0851 05/24/19 0250  NA 135 135 139  K 4.2 3.5 3.7  CL 102 102 109  CO2 23 21* 19*  GLUCOSE 142* 146* 204*  BUN 8 14 16   CREATININE 0.92 0.90 0.83  CALCIUM 8.5* 8.5* 8.0*   GFR: Estimated Creatinine Clearance: 55.9 mL/min (by C-G formula based on SCr of 0.83 mg/dL). Liver Function Tests: Recent Labs  Lab 05/19/19 2023 05/23/19 0851 05/24/19 0250  AST 57* 49* 33  ALT 35 43 36  ALKPHOS 67 58 54  BILITOT 0.8 1.0 0.4  PROT 7.5 7.7 6.8  ALBUMIN 3.8 3.5 2.9*   No results for input(s): LIPASE, AMYLASE in the last 168 hours. No results for input(s): AMMONIA in the last 168 hours.  Coagulation Profile: No results for input(s): INR, PROTIME in the last 168 hours. Cardiac Enzymes: No results for input(s): CKTOTAL, CKMB, CKMBINDEX, TROPONINI in the last 168 hours. BNP (last 3 results) No results for input(s): PROBNP in the last 8760 hours. HbA1C: Recent Labs    05/24/19 0250  HGBA1C 7.4*   CBG: Recent Labs  Lab 05/23/19 2141 05/24/19 0847  GLUCAP 192* 147*   Lipid Profile: Recent Labs    05/23/19 0851  TRIG 90   Thyroid Function Tests: No results for input(s): TSH, T4TOTAL, FREET4, T3FREE, THYROIDAB in the last 72 hours. Anemia Panel: Recent Labs    05/23/19 0851 05/24/19 0250  FERRITIN 247 242   Sepsis Labs: Recent Labs  Lab 05/19/19 2023 05/23/19 0851 05/23/19 0917 05/23/19 1420  PROCALCITON <0.10 0.10  --   --   LATICACIDVEN  --   --  1.5 1.1    Recent Results (from the past 240 hour(s))  Novel Coronavirus, NAA ([facility])     Status: Abnormal   Collection Time: 05/15/19 12:00 AM  Result Value Ref Range Status   SARS-CoV-2, NAA Detected (A) Not Detected Final    Comment: Testing was performed using the cobas(R) SARS-CoV-2 test. This test was developed and its performance characteristics determined by [employer]. This test has not been FDA cleared or approved. This test has been authorized by FDA under an Emergency Use Authorization (EUA). This test is only authorized for the duration of time the declaration that circumstances exist justifying the authorization of the emergency use of in vitro diagnostic tests for detection of SARS-CoV-2 virus and/or diagnosis of COVID-19 infection under section 564(b)(1) of the Act, 21 U.S.C. 240XBD-5(H)(2), unless the authorization is terminated or revoked sooner. When diagnostic testing is negative, the possibility of a false negative result should be considered in the context of a patient's recent exposures and the presence of clinical signs and symptoms consistent with COVID-19. An  individual without symptoms of COVID-19 and who is not shedding SARS-CoV-2 virus would expect to have a negati ve (not detected) result in this assay.   Blood Culture (routine x 2)     Status: None (Preliminary result)   Collection Time: 05/23/19  8:51 AM   Specimen: BLOOD  Result Value Ref Range Status   Specimen Description   Final    BLOOD LEFT ANTECUBITAL Performed at [facility], [place] [address] [place], [place] [address]    Special Requests   Final    BOTTLES DRAWN AEROBIC AND ANAEROBIC Blood Culture adequate volume Performed at [facility] [address], [place], [place] [address]    Culture   Final    NO GROWTH < 24 HOURS Performed at [facility] [address], [place], [place] [address]    Report Status PENDING  Incomplete  Blood Culture (routine x 2)     Status: None (Preliminary result)   Collection Time: 05/23/19  8:56 AM   Specimen: BLOOD RIGHT FOREARM  Result Value Ref Range Status   Specimen Description   Final    BLOOD RIGHT FOREARM Performed at [facility] [place] [address], [place], [place] [address]    Special Requests   Final    BOTTLES DRAWN AEROBIC AND ANAEROBIC Blood Culture adequate volume Performed at [facility] [address], [place], [place] [address]    Culture   Final    NO GROWTH < 24 HOURS Performed at [facility] [address] [place], [place] [address]    Report Status PENDING  Incomplete  MRSA PCR Screening     Status: None   Collection Time: 05/23/19  2:55 PM   Specimen: Nasal Mucosa; Nasopharyngeal  Result Value Ref Range Status   MRSA by PCR NEGATIVE NEGATIVE Final    Comment:        The GeneXpert MRSA Assay (FDA approved for NASAL specimens only), is one component of a comprehensive MRSA colonization surveillance program. It is not intended to diagnose MRSA infection nor to guide or monitor treatment for MRSA infections. Performed at  [facility], [place] [address] [place], [place] [address]          Radiology  Studies: Dg Chest Port 1 View  Result Date: 05/23/2019 CLINICAL DATA:  COVID, shortness of breath. Additional history: Patient complains of shortness of breath, cough, central chest pain, mid back pain and nausea since July 18th. EXAM: PORTABLE CHEST 1 VIEW COMPARISON:  Chest radiograph 05/19/2019. FINDINGS: The heart is normal in size. Hazy opacities at the bilateral lung bases, as well as a new ill-defined opacity within the left lower lung. No evidence of pleural effusion or pneumothorax. No acute bony abnormality IMPRESSION: Hazy bibasilar opacities as well as new ill-defined opacity in the left lower lung. Given provided history, findings are suspicious for developing pneumonia. Electronically Signed   By: Kellie Simmering   On: 05/23/2019 10:41        Scheduled Meds: . Derrill Memo ON 05/25/2019] azithromycin  500 mg Oral Daily  . Chlorhexidine Gluconate Cloth  6 each Topical Q0600  . dexamethasone  6 mg Oral Daily  . enoxaparin (LOVENOX) injection  40 mg Subcutaneous Q12H  . fluticasone  1 puff Inhalation BID  . insulin aspart  0-15 Units Subcutaneous TID WC  . mouth rinse  15 mL Mouth Rinse BID  . pantoprazole  40 mg Oral Daily  . sodium chloride flush  3 mL Intravenous Q12H   Continuous Infusions: . sodium chloride    . cefTRIAXone (ROCEPHIN)  IV    . remdesivir 100 mg in NS 250 mL       LOS: 1 day    Time spent: over 30 min 40 min critical care time with worsening ahrf 2/2 COVID 19 pneumonia   Fayrene Helper, MD [facility] Pager AMION  If 7PM-7AM, please contact night-coverage [email or web address] Password [email or web address] 05/24/2019, 12:05 PM

## 2019-05-24 NOTE — Progress Notes (Signed)
Spoke with patients daughter Olin Hauser.  Answered all questions and concerns.

## 2019-05-25 ENCOUNTER — Inpatient Hospital Stay (HOSPITAL_COMMUNITY): Payer: PPO

## 2019-05-25 LAB — GLUCOSE, CAPILLARY
Glucose-Capillary: 168 mg/dL — ABNORMAL HIGH (ref 70–99)
Glucose-Capillary: 118 mg/dL — ABNORMAL HIGH (ref 70–99)
Glucose-Capillary: 204 mg/dL — ABNORMAL HIGH (ref 70–99)
Glucose-Capillary: 144 mg/dL — ABNORMAL HIGH (ref 70–99)

## 2019-05-25 LAB — CBC WITH DIFFERENTIAL/PLATELET
Abs Immature Granulocytes: 0.07 10*3/uL (ref 0.00–0.07)
Basophils Absolute: 0 10*3/uL (ref 0.0–0.1)
Basophils Relative: 0 %
Eosinophils Absolute: 0 10*3/uL (ref 0.0–0.5)
Eosinophils Relative: 0 %
HCT: 36.4 % (ref 36.0–46.0)
Hemoglobin: 11.4 g/dL — ABNORMAL LOW (ref 12.0–15.0)
Immature Granulocytes: 1 %
Lymphocytes Relative: 8 %
Lymphs Abs: 0.9 10*3/uL (ref 0.7–4.0)
MCH: 25.2 pg — ABNORMAL LOW (ref 26.0–34.0)
MCHC: 31.3 g/dL (ref 30.0–36.0)
MCV: 80.5 fL (ref 80.0–100.0)
Monocytes Absolute: 0.4 10*3/uL (ref 0.1–1.0)
Monocytes Relative: 4 %
Neutro Abs: 10.7 10*3/uL — ABNORMAL HIGH (ref 1.7–7.7)
Neutrophils Relative %: 87 %
Platelets: 437 10*3/uL — ABNORMAL HIGH (ref 150–400)
RBC: 4.52 MIL/uL (ref 3.87–5.11)
RDW: 16.9 % — ABNORMAL HIGH (ref 11.5–15.5)
WBC: 12.1 10*3/uL — ABNORMAL HIGH (ref 4.0–10.5)
nRBC: 0 % (ref 0.0–0.2)

## 2019-05-25 LAB — PROCALCITONIN: Procalcitonin: 0.11 ng/mL

## 2019-05-25 LAB — C-REACTIVE PROTEIN: CRP: 8.3 mg/dL — ABNORMAL HIGH (ref <1.0)

## 2019-05-25 LAB — FERRITIN: Ferritin: 280 ng/mL (ref 11–307)

## 2019-05-25 MED ORDER — FUROSEMIDE 10 MG/ML IJ SOLN
40.0000 mg | Freq: Once | INTRAMUSCULAR | Status: AC
  Administered 2019-05-25: 16:00:00 40 mg via INTRAVENOUS
  Filled 2019-05-25: qty 4

## 2019-05-25 MED ORDER — LORATADINE 10 MG PO TABS
10.0000 mg | ORAL_TABLET | Freq: Every day | ORAL | Status: DC
  Administered 2019-05-26 – 2019-06-04 (×10): 10 mg via ORAL
  Filled 2019-05-25 (×13): qty 1

## 2019-05-25 MED ORDER — FLUTICASONE PROPIONATE 50 MCG/ACT NA SUSP
2.0000 | Freq: Every day | NASAL | Status: DC
  Administered 2019-05-26 – 2019-05-29 (×4): 2 via NASAL
  Filled 2019-05-25: qty 16

## 2019-05-25 NOTE — Progress Notes (Addendum)
[place] TEAM 1 - Stepdown/ICU TEAM  KATERYN MARASIGAN  HYI:[id number] DOB: February 17, 1951 DOA: 05/23/2019 PCP: Leeanne Rio, MD    Brief Narrative:  68 year old with a history of asthma and DM who was diagnosed with COVID-19 on July 23 and presented to the ED 7/29 with complaints of worsening shortness of breath.  She was found to have a temperature of 102 and was saturating 80% on room air.  A chest x-ray noted hazy bibasilar opacities.  She originally presented to the ED on July 25th with shortness of breath but at that time was stable enough to be discharged home on prednisone.  Her testing was accomplished via the [facility] testing center as arranged by her PCP on July 23.   Significant Events: 7/18 developed for symptoms after exposure to 32 year old grandson who lives with her 7/23 outpatient COVID test positive 7/25 evaluated in ED and sent home 7/29 admit to [facility] via ED 7/30 transfer to [facility]  COVID-19 specific Treatment: Remdesivir 7/29 > Actemra 7/29 Steroids 7/29 >  Subjective: I was contacted by the patient's nurse first thing this morning who reported that she was maxed out on high flow nasal cannula via the Salter device and still in some respiratory distress.  I reported to the room and found the patient on 15 L via salter with some clear increased work of breathing and tachypnea.  She was able to complete full sentences.  Her sats were in the low 90s to upper 80s.  She denied chest pain nausea vomiting or abdominal pain.  I discussed the nature of her current pneumonitis with her at length as well as our treatment strategy.  I discussed the importance of trying prone positioning and explained the physiologic benefit of doing so.  She agreed to trying this.  The patient's morning chest x-ray was reviewed by this physician and suggest worsening pulmonary infiltrates compared to her most recent prior chest x-ray.  I spent 35 minutes in the care of this patient to  include reviewing labs, reviewing x-rays, reviewing medications and making changes as appropriate, interviewing the patient, and examining the patient.  Assessment & Plan:  COVID Pneumonia - acute hypoxic respiratory failure The patient is marginally holding on with Salter high flow nasal cannula at this time -she is being watched very closely by her staff RN as well as the ICU charge nurse -if she does not improve with prone positioning we will need to consider transferring her to the ICU to begin heated high flow nasal cannula -continue Remdesivir and steroids -she has already been dosed with Actemra  Recent Labs    05/23/19 0851 05/24/19 0250 05/25/19 0102  DDIMER 3.53* 7.40*  --   FERRITIN 247 242 280  LDH 414*  --   --   CRP 15.5* 17.1* 8.3*    DM 2 A1c is 7.4 -continue sliding scale insulin while patient is being dosed with steroids  Asthma Quiescent at this time  DVT prophylaxis: lovenox intermediate dose  Code Status: FULL CODE Family Communication:  Disposition Plan:   Consultants:  none  Antimicrobials:  Azithromycin 7/29 >7/30 Ceftriaxone 7/29  Objective: Blood pressure 127/75, pulse (!) 59, temperature 97.6 F (36.4 C), temperature source Oral, resp. rate 17, height 5\' 1"  (1.549 m), weight 64.9 kg, SpO2 94 %.  Intake/Output Summary (Last 24 hours) at 05/25/2019 0723 Last data filed at 05/24/2019 1739 Gross per 24 hour  Intake 994.79 ml  Output 400 ml  Net 594.79 ml  Filed Weights   05/23/19 1442  Weight: 64.9 kg    Examination: General: Clear respiratory distress with tachypnea and oxygen desaturations Lungs: Coarse crackles throughout all fields bilaterally Cardiovascular: Mild tachycardia but regular without murmur or rub Abdomen: Nontender, nondistended, soft, bowel sounds positive, no rebound, no ascites, no appreciable mass Extremities: No significant cyanosis, clubbing, or edema bilateral lower extremities  CBC: Recent Labs  Lab 05/19/19  2023 05/23/19 0851 05/25/19 0102  WBC 7.9 16.0* 12.1*  NEUTROABS 5.7 14.5* 10.7*  HGB 13.7 12.5 11.4*  HCT 43.8 38.5 36.4  MCV 81.6 80.0 80.5  PLT 246 317 347*   Basic Metabolic Panel: Recent Labs  Lab 05/19/19 2023 05/23/19 0851 05/24/19 0250  NA 135 135 139  K 4.2 3.5 3.7  CL 102 102 109  CO2 23 21* 19*  GLUCOSE 142* 146* 204*  BUN 8 14 16   CREATININE 0.92 0.90 0.83  CALCIUM 8.5* 8.5* 8.0*   GFR: Estimated Creatinine Clearance: 55.9 mL/min (by C-G formula based on SCr of 0.83 mg/dL).  Liver Function Tests: Recent Labs  Lab 05/19/19 2023 05/23/19 0851 05/24/19 0250  AST 57* 49* 33  ALT 35 43 36  ALKPHOS 67 58 54  BILITOT 0.8 1.0 0.4  PROT 7.5 7.7 6.8  ALBUMIN 3.8 3.5 2.9*    HbA1C: HbA1c, POC (controlled diabetic range)  Date/Time Value Ref Range Status  08/11/2018 04:03 PM 6.4 0.0 - 7.0 % Final  03/31/2018 10:54 AM 6.4 0.0 - 7.0 % Final   Hgb A1c MFr Bld  Date/Time Value Ref Range Status  05/24/2019 02:50 AM 7.4 (H) 4.8 - 5.6 % Final    Comment:    (NOTE) Pre diabetes:          5.7%-6.4% Diabetes:              >6.4% Glycemic control for   <7.0% adults with diabetes   02/23/2019 02:18 PM 6.7 (H) 4.8 - 5.6 % Final    Comment:             Prediabetes: 5.7 - 6.4          Diabetes: >6.4          Glycemic control for adults with diabetes: <7.0     CBG: Recent Labs  Lab 05/23/19 2141 05/24/19 0847 05/24/19 1246 05/24/19 1636 05/24/19 2113  GLUCAP 192* 147* 120* 165* 163*    Recent Results (from the past 240 hour(s))  Blood Culture (routine x 2)     Status: None (Preliminary result)   Collection Time: 05/23/19  8:51 AM   Specimen: BLOOD  Result Value Ref Range Status   Specimen Description   Final    BLOOD LEFT ANTECUBITAL Performed at [facility], [address], [place], [place] [address]    Special Requests   Final    BOTTLES DRAWN AEROBIC AND ANAEROBIC Blood Culture adequate volume Performed at [facility] [address], [place], [place] [address]    Culture   Final    NO GROWTH < 24 HOURS Performed at [facility] [address] [place], [place] [address]    Report Status PENDING  Incomplete  Blood Culture (routine x 2)     Status: None (Preliminary result)   Collection Time: 05/23/19  8:56 AM   Specimen: BLOOD RIGHT FOREARM  Result Value Ref Range Status   Specimen Description   Final    BLOOD RIGHT FOREARM Performed at [facility],  [place] [address], [place], [place] [address]    Special Requests   Final    BOTTLES DRAWN AEROBIC AND ANAEROBIC Blood Culture adequate volume Performed at [facility] [address] [place], [place] [address]    Culture   Final    NO GROWTH < 24 HOURS Performed at [facility] [address], [place], [place] [address]    Report Status PENDING  Incomplete  MRSA PCR Screening     Status: None   Collection Time: 05/23/19  2:55 PM   Specimen: Nasal Mucosa; Nasopharyngeal  Result Value Ref Range Status   MRSA by PCR NEGATIVE NEGATIVE Final    Comment:        The GeneXpert MRSA Assay (FDA approved for NASAL specimens only), is one component of a comprehensive MRSA colonization surveillance program. It is not intended to diagnose MRSA infection nor to guide or monitor treatment for MRSA infections. Performed at [facility], [place] [address] [place], [place] [address]      Scheduled Meds: . Chlorhexidine Gluconate Cloth  6 each Topical Q0600  . dexamethasone  6 mg Oral Daily  . enoxaparin (LOVENOX) injection  40 mg Subcutaneous Q12H  . fluticasone  1 puff Inhalation BID  . insulin aspart  0-15 Units Subcutaneous TID WC  . mouth rinse  15 mL Mouth Rinse BID  . pantoprazole  40 mg Oral Daily  . sodium chloride flush  3 mL Intravenous Q12H   Continuous Infusions: . sodium chloride    . remdesivir 100 mg in NS 250 mL 100 mg (05/24/19 1739)     LOS: 2 days    Cherene Altes, MD [facility] Office  [phone] Pager - Text Page per Amion  If 7PM-7AM, please contact night-coverage per Amion 05/25/2019, 7:23 AM

## 2019-05-25 NOTE — Progress Notes (Signed)
Called Diana Roach, patients daughter.  No answer, left voicemail to call back for updates.

## 2019-05-26 ENCOUNTER — Other Ambulatory Visit: Payer: Self-pay

## 2019-05-26 DIAGNOSIS — J988 Other specified respiratory disorders: Secondary | ICD-10-CM

## 2019-05-26 DIAGNOSIS — E119 Type 2 diabetes mellitus without complications: Secondary | ICD-10-CM

## 2019-05-26 DIAGNOSIS — J9601 Acute respiratory failure with hypoxia: Secondary | ICD-10-CM

## 2019-05-26 DIAGNOSIS — U071 COVID-19: Principal | ICD-10-CM

## 2019-05-26 DIAGNOSIS — J1289 Other viral pneumonia: Secondary | ICD-10-CM

## 2019-05-26 LAB — CBC
HCT: 40.1 % (ref 36.0–46.0)
Hemoglobin: 12.6 g/dL (ref 12.0–15.0)
MCH: 25.1 pg — ABNORMAL LOW (ref 26.0–34.0)
MCHC: 31.4 g/dL (ref 30.0–36.0)
MCV: 79.9 fL — ABNORMAL LOW (ref 80.0–100.0)
Platelets: 501 10*3/uL — ABNORMAL HIGH (ref 150–400)
RBC: 5.02 MIL/uL (ref 3.87–5.11)
RDW: 16.6 % — ABNORMAL HIGH (ref 11.5–15.5)
WBC: 8.8 10*3/uL (ref 4.0–10.5)
nRBC: 0 % (ref 0.0–0.2)

## 2019-05-26 LAB — COMPREHENSIVE METABOLIC PANEL
ALT: 30 U/L (ref 0–44)
AST: 31 U/L (ref 15–41)
Albumin: 3.1 g/dL — ABNORMAL LOW (ref 3.5–5.0)
Alkaline Phosphatase: 70 U/L (ref 38–126)
Anion gap: 12 (ref 5–15)
BUN: 29 mg/dL — ABNORMAL HIGH (ref 8–23)
CO2: 25 mmol/L (ref 22–32)
Calcium: 8.4 mg/dL — ABNORMAL LOW (ref 8.9–10.3)
Chloride: 103 mmol/L (ref 98–111)
Creatinine, Ser: 0.97 mg/dL (ref 0.44–1.00)
GFR calc Af Amer: >60 mL/min (ref >60)
GFR calc non Af Amer: >60 mL/min (ref >60)
Glucose, Bld: 133 mg/dL — ABNORMAL HIGH (ref 70–99)
Potassium: 3.7 mmol/L (ref 3.5–5.1)
Sodium: 140 mmol/L (ref 135–145)
Total Bilirubin: 0.6 mg/dL (ref 0.3–1.2)
Total Protein: 6.8 g/dL (ref 6.5–8.1)

## 2019-05-26 LAB — D-DIMER, QUANTITATIVE: D-Dimer, Quant: 6.83 ug/mL-FEU — ABNORMAL HIGH (ref 0.00–0.50)

## 2019-05-26 LAB — FERRITIN: Ferritin: 274 ng/mL (ref 11–307)

## 2019-05-26 LAB — MAGNESIUM: Magnesium: 2.1 mg/dL (ref 1.7–2.4)

## 2019-05-26 LAB — GLUCOSE, CAPILLARY
Glucose-Capillary: 117 mg/dL — ABNORMAL HIGH (ref 70–99)
Glucose-Capillary: 104 mg/dL — ABNORMAL HIGH (ref 70–99)
Glucose-Capillary: 144 mg/dL — ABNORMAL HIGH (ref 70–99)

## 2019-05-26 LAB — C-REACTIVE PROTEIN: CRP: 4.1 mg/dL — ABNORMAL HIGH (ref <1.0)

## 2019-05-26 LAB — TROPONIN I (HIGH SENSITIVITY)
Troponin I (High Sensitivity): 6 ng/L (ref <18)
Troponin I (High Sensitivity): 6 ng/L (ref <18)

## 2019-05-26 LAB — TSH: TSH: 1.563 u[IU]/mL (ref 0.350–4.500)

## 2019-05-26 MED ORDER — ENSURE ENLIVE PO LIQD
237.0000 mL | Freq: Two times a day (BID) | ORAL | Status: DC
  Administered 2019-05-26 – 2019-06-04 (×19): 237 mL via ORAL
  Filled 2019-05-26 (×5): qty 237

## 2019-05-26 MED ORDER — ENOXAPARIN SODIUM 80 MG/0.8ML ~~LOC~~ SOLN
65.0000 mg | Freq: Two times a day (BID) | SUBCUTANEOUS | Status: DC
  Administered 2019-05-26 – 2019-05-27 (×3): 65 mg via SUBCUTANEOUS
  Filled 2019-05-26 (×3): qty 0.8

## 2019-05-26 MED ORDER — BENZONATATE 100 MG PO CAPS
200.0000 mg | ORAL_CAPSULE | Freq: Three times a day (TID) | ORAL | Status: DC | PRN
  Administered 2019-05-27 – 2019-06-01 (×4): 200 mg via ORAL
  Filled 2019-05-26 (×4): qty 2

## 2019-05-26 MED ORDER — ASPIRIN 81 MG PO CHEW
81.0000 mg | CHEWABLE_TABLET | Freq: Every day | ORAL | Status: DC
  Administered 2019-05-26 – 2019-05-29 (×4): 81 mg
  Filled 2019-05-26 (×4): qty 1

## 2019-05-26 MED ORDER — SODIUM CHLORIDE 0.9 % IV SOLN
100.0000 mg | INTRAVENOUS | Status: AC
  Administered 2019-05-27: 100 mg via INTRAVENOUS
  Filled 2019-05-26: qty 20

## 2019-05-26 NOTE — Consult Note (Signed)
NAME:  Diana Roach, MRN:  [id number], DOB:  [birth year], LOS: 3 ADMISSION DATE:  05/23/2019, CONSULTATION DATE:  8/1 REFERRING MD:  Thereasa Solo, CHIEF COMPLAINT:  Response to code   Brief History   68 y/o female for severe acute respiratory failure with hypoemia on 7/29 developed a bradycardic event and syncope on 8/1 requiring atropine.  History of present illness   68 y/o female with no known cardiac history admitted on 7/28 with severe acute respiratory failure with hypoxemia.  She was treated with actemra and has been treated with decadron and remdesivir. Her oxygenation had been stable to improving, improved while in the prone position.  Overnight her oxygen needs increased from 12 to 15 liters, but otherwise her respiratory status was unchanged. When she was transitioning from chair to bed she had a severe bradycardia episode and lost consciousness.  Atropine was administered due to bradycardia (4.5 second pause) at which time she regained a normal heart rate and regained consciousness. She did not lose a pulse and did not require CPR.  Past Medical History  Asthma DM2 Diverticulitis cataracts  Significant Hospital Events   7/29 admission  Consults:  PCCM  Procedures:  none  Significant Diagnostic Tests:  Mar 22, 2019 echocardiogram LVEF 60 to 65%, right ventricle normal in size and function, no aortic stenosis  Micro Data:  7/29 blood >   Antimicrobials:  July 29 ceftriaxone x1 July 29 azithromycin through July 30 July 29 remdesivir >  July 29 tocilizumab >  July 29 decadron >   Interim history/subjective:  As above  Objective   Blood pressure 123/65, pulse (!) 52, temperature 97.7 F (36.5 C), temperature source Oral, resp. rate 15, height 5\' 1"  (1.549 m), weight 64.9 kg, SpO2 92 %.        Intake/Output Summary (Last 24 hours) at 05/26/2019 0748 Last data filed at 05/26/2019 0500 Gross per 24 hour  Intake -  Output 1300 ml  Net -1300 ml   Filed Weights   05/23/19 1442  Weight: 64.9 kg    Examination:  General:  Resting comfortably in bed HENT: NCAT OP clear PULM: CTA B, normal effort CV: RRR, no mgr GI: BS+, soft, nontender MSK: normal bulk and tone Neuro: awake, alert, no distress, MAEW  Twelve-lead EKG May 26, 2019 reviewed showing sinus rhythm, no ischemic changes no interval prolongation   Resolved Hospital Problem list     Assessment & Plan:  Sinus Bradycardia in setting of severe hypoxemia: currently sinus rhythm, normal mental status Suspect stress of hypoxemia, vagal event occurred when she moved from bed to chair -move to ICU for closer monitoring -Tele monitoring -check TSH -review medications for bradycardia effect with pharmacy -consider cardiology consult -Continue treatment for COVID  COVID 19 pneumonia causing severe hypoxemia -continue remdesivir, decadron per treatment protocol -continue oxygen via high flow nasal cannula -Tolerate periods of hypoxemia, goal at rest is greater than 85% SaO2, with movement ideally above 75% -Decision for intubation should be based on a change in mental status or physical evidence of ventilatory failure such as nasal flaring, accessory muscle use, paradoxical breathing -Out of bed to chair as able -Incentive spirometry is important, use every hour -Prone positioning while in bed   Best practice:  Diet: regular diet Pain/Anxiety/Delirium protocol (if indicated): n/a VAP protocol (if indicated): n/a DVT prophylaxis: continue lovenox per protocol GI prophylaxis: n/a Glucose control: per TRH Mobility: bed rest Code Status: full Family Communication: per [facility] Disposition: to ICU today  Labs  CBC: Recent Labs  Lab 05/19/19 2023 05/23/19 0851 05/25/19 0102 05/26/19 0050  WBC 7.9 16.0* 12.1* 8.8  NEUTROABS 5.7 14.5* 10.7*  --   HGB 13.7 12.5 11.4* 12.6  HCT 43.8 38.5 36.4 40.1  MCV 81.6 80.0 80.5 79.9*  PLT 246 317 437* 501*    Basic Metabolic Panel:  Recent Labs  Lab 05/19/19 2023 05/23/19 0851 05/24/19 0250 05/26/19 0050  NA 135 135 139 140  K 4.2 3.5 3.7 3.7  CL 102 102 109 103  CO2 23 21* 19* 25  GLUCOSE 142* 146* 204* 133*  BUN 8 14 16  29*  CREATININE 0.92 0.90 0.83 0.97  CALCIUM 8.5* 8.5* 8.0* 8.4*   GFR: Estimated Creatinine Clearance: 47.8 mL/min (by C-G formula based on SCr of 0.97 mg/dL). Recent Labs  Lab 05/19/19 2023 05/23/19 0851 05/23/19 0917 05/23/19 1420 05/25/19 0102 05/26/19 0050  PROCALCITON <0.10 0.10  --   --  0.11  --   WBC 7.9 16.0*  --   --  12.1* 8.8  LATICACIDVEN  --   --  1.5 1.1  --   --     Liver Function Tests: Recent Labs  Lab 05/19/19 2023 05/23/19 0851 05/24/19 0250 05/26/19 0050  AST 57* 49* 33 31  ALT 35 43 36 30  ALKPHOS 67 58 54 70  BILITOT 0.8 1.0 0.4 0.6  PROT 7.5 7.7 6.8 6.8  ALBUMIN 3.8 3.5 2.9* 3.1*   No results for input(s): LIPASE, AMYLASE in the last 168 hours. No results for input(s): AMMONIA in the last 168 hours.  ABG No results found for: PHART, PCO2ART, PO2ART, HCO3, TCO2, ACIDBASEDEF, O2SAT   Coagulation Profile: No results for input(s): INR, PROTIME in the last 168 hours.  Cardiac Enzymes: No results for input(s): CKTOTAL, CKMB, CKMBINDEX, TROPONINI in the last 168 hours.  HbA1C: HbA1c, POC (controlled diabetic range)  Date/Time Value Ref Range Status  08/11/2018 04:03 PM 6.4 0.0 - 7.0 % Final  03/31/2018 10:54 AM 6.4 0.0 - 7.0 % Final   Hgb A1c MFr Bld  Date/Time Value Ref Range Status  05/24/2019 02:50 AM 7.4 (H) 4.8 - 5.6 % Final    Comment:    (NOTE) Pre diabetes:          5.7%-6.4% Diabetes:              >6.4% Glycemic control for   <7.0% adults with diabetes   02/23/2019 02:18 PM 6.7 (H) 4.8 - 5.6 % Final    Comment:             Prediabetes: 5.7 - 6.4          Diabetes: >6.4          Glycemic control for adults with diabetes: <7.0     CBG: Recent Labs  Lab 05/24/19 2113 05/25/19 0751 05/25/19 1132 05/25/19 1631 05/25/19  2150  GLUCAP 163* 168* 118* 204* 144*       Critical care time: 31 minutes     Roselie Awkward, MD [place] PCCM Pager: [phone] Cell: [phone] If no response, call [phone]

## 2019-05-26 NOTE — Progress Notes (Signed)
Pt in proned position, tolerating well.  Pt saturating 92% on 5L .  Will continue to titrate O2 as able.  Sinus brady on monitor with HR at 56.  Physician aware.  Pt alert and responsive, oriented x 4.  Will continue to monitor closely.

## 2019-05-26 NOTE — Progress Notes (Signed)
ANTICOAGULATION CONSULT NOTE - Initial Consult  Pharmacy Consult for Lovenox Indication: r/o PE, ACS  Allergies  Allergen Reactions  . Codeine Itching and Hives  . Minocycline Other (See Comments)    Dizziness , "strange feeling"  . Contrast Media [Iodinated Diagnostic Agents] Other (See Comments)    BP dropped  . Levaquin [Levofloxacin] Other (See Comments)    Hallucinations     Patient Measurements: Height: 5\' 1"  (154.9 cm) Weight: 143 lb 1.3 oz (64.9 kg) IBW/kg (Calculated) : 47.8  Vital Signs: Temp: 97.7 F (36.5 C) (08/01 0500) Temp Source: Oral (08/01 0500) BP: 108/76 (08/01 0805) Pulse Rate: 79 (08/01 0805)  Labs: Recent Labs    05/23/19 0851 05/23/19 1652 05/23/19 1935 05/24/19 0250 05/25/19 0102 05/26/19 0050  HGB 12.5  --   --   --  11.4* 12.6  HCT 38.5  --   --   --  36.4 40.1  PLT 317  --   --   --  437* 501*  CREATININE 0.90  --   --  0.83  --  0.97  TROPONINIHS  --  9 8  --   --   --     Estimated Creatinine Clearance: 47.8 mL/min (by C-G formula based on SCr of 0.97 mg/dL).   Medical History: Past Medical History:  Diagnosis Date  . Asthma   . Cataract   . Diabetes mellitus without complication ([place])   . Diverticulitis   . Environmental allergies   . Lichen planus     Assessment: 32 yoF admitted on 7/29 with COVID-19 pneumonia.  Today she had Code Blue w/ bradycardia requiring atropine administration.  Pharmacy is consulted to dose Lovenox for r/o PE, ACS. She has been on prophylactic Lovenox 40 mg SQ q12h.  Last dose on 7/31 at 2100.    Weight 65 kg SCr 0.97 CBC Hgb 12.6, Plt 501  Goal of Therapy:  Anti-Xa level 0.6-1 units/ml 4hrs after LMWH dose given Monitor platelets by anticoagulation protocol: Yes   Plan:  Lovenox 1 mg/kg (65mg ) SQ Q12h. Follow up renal function, CBC.  Gretta Arab PharmD, BCPS Clinical pharmacist phone 7am- 5pm: [phone] 05/26/2019 8:49 AM

## 2019-05-26 NOTE — Progress Notes (Signed)
Upon AM rounds was in room with patient on 12L HFNC with nurse tech assisting pt to chair for breakfast.  Pt was very weak and slow moving to chair, required two person assist.  Once in recliner with legs up pt repeatedly stated "I'm passing out".  Sinus brady rate of 40 noted on monitor.  HR kept dropping to lowest rate in low 20s with a 4 second pause.  This RN and nurse tech moved patient back to bed.  Staff assist activated.  Pt transitioned to nonrebreather mask at 15L.  Pt eyes open and minimally responsive with eyes rolling back in head.  1 mg of Atropine given by this RN.  HR increased to 80s - 90s.  Pt alert and responsive.  Physician notified and at bedside.  Pt transported to ICU.  Patient's daughter, Olin Hauser, was called and updated.  All questions welcomed and answered.  Will continue to monitor closely.

## 2019-05-26 NOTE — Progress Notes (Signed)
Patients daughter, Olin Hauser, was updated on plan of care and current condition. All questions welcomed and answered. Education performed on oxygen usage and proning. Olin Hauser asked questions about care for her mother after this hospitalization and requested a case manager contact her before discharge. Olin Hauser would not like to video chat at this time.

## 2019-05-26 NOTE — Progress Notes (Signed)
[place] TEAM 1 - Stepdown/ICU TEAM  Diana Roach  HYW:[id number] DOB: Jun 07, 1951 DOA: 05/23/2019 PCP: Leeanne Rio, MD    Brief Narrative:  68 year old with a history of asthma and DM who was diagnosed with COVID-19 on July 23 and presented to the ED 7/29 with complaints of worsening shortness of breath.  She was found to have a temperature of 102 and was saturating 80% on room air.  A chest x-ray noted hazy bibasilar opacities.  She originally presented to the ED on July 25th with shortness of breath but at that time was stable enough to be discharged home on prednisone.  Her testing was accomplished via the [facility] testing center as arranged by her PCP on July 23.   Significant Events: 7/18 developed symptoms after exposure to + 31 year old grandson who lives with her 7/23 outpatient COVID test positive 7/25 evaluated in ED and sent home 7/29 admit to [facility] via ED 7/30 transfer to [facility]  COVID-19 specific Treatment: Remdesivir 7/29 > Actemra 7/29 Steroids 7/29 >  Subjective: I came to the bedside emergently in response to a CODE BLUE.  The patient's nurse was at bedside and explained that she and another staff member were getting the patient up to the bedside chair when she desaturated into the 18s.  She then developed bradycardia and became unresponsive.  The nurse noted a heart rate in the 20s on the bedside monitor and review of telemetry strips confirms a 4-1/2 to 5-second pause.  The patient was dosed with atropine per CODE BLUE protocol by the attending nurse.  It does not appear that she ever completely lost a pulse and she did not require CPR.  At the time of my arrival, along with Dr. Lake Bells with PCCM, the patient is back in bed and beginning to respond.  The full code team was at bedside and the code cart is in the room.  The patient is on nonrebreather oxygen with saturations in the upper 90s.  There is no evidence of severe respiratory distress.  The  patient was able to speak to me and provide a history.  She reported feeling fullness in her chest.  She also reported some shortness of breath.  She denied focal neurologic deficits, headache, nausea or vomiting.  She denied diarrhea.  After a bedside exam and observation in the room the decision was made to transfer her to the ICU for closer monitoring.  An EKG has been ordered as have troponins.  Assessment & Plan:  COVID Pneumonia - acute hypoxic respiratory failure continue Remdesivir and steroids - she has already been dosed with Actemra -despite her troubles this morning her respiratory status has not significantly changed -for now we will attempt to continue the [place] high flow nasal cannula with close observation for potential need to transition to heated high flow should her respiratory status decline.  Recent Labs    05/23/19 0851 05/24/19 0250 05/25/19 0102 05/26/19 0050  DDIMER 3.53* 7.40*  --  6.83*  FERRITIN 247 242 280 274  LDH 414*  --   --   --   CRP 15.5* 17.1* 8.3* 4.1*    Cardiac Pause - Bradycardic arrest The events of this morning are detailed above -in short the patient had a 4-1/2 to 5-second pause and became unconscious/arrested -she rapidly improved with atropine and did not require CPR - an EKG is pending and troponins will be cycled -she is being moved to the ICU for closer monitoring -telemetry will  of course continue - I will check a magnesium -she is not on any negative chronotropic medications -increase Lovenox to full treatment dose and add aspirin -avoiding beta-blocker for obvious reasons -given significantly elevated d-dimer will consider evaluating for pulmonary embolism but presently do not feel it is safe to send the patient down for a CT angio  DM 2 A1c is 7.4 -continue sliding scale insulin while patient is being dosed with steroids -CBG is reasonably controlled at present  Asthma Quiescent at this time  DVT prophylaxis: lovenox full dose while  PE / ACS being ruled out  Code Status: FULL CODE Family Communication:  Disposition Plan:   Consultants:  none  Antimicrobials:  Azithromycin 7/29 >7/30 Ceftriaxone 7/29  Objective: Blood pressure 123/65, pulse (!) 52, temperature 97.7 F (36.5 C), temperature source Oral, resp. rate 15, height 5\' 1"  (1.549 m), weight 64.9 kg, SpO2 92 %.  Intake/Output Summary (Last 24 hours) at 05/26/2019 0725 Last data filed at 05/26/2019 0500 Gross per 24 hour  Intake --  Output 1300 ml  Net -1300 ml   Filed Weights   05/23/19 1442  Weight: 64.9 kg    Examination: General: Groggy but awake and able to answer questions -no acute respiratory distress on nonrebreather mask Neuro: Alert and oriented x4, no cranial nerve deficits, 4+/5 strength upper and lower extremities, no focal neurologic deficits Lungs: Coarse crackles throughout B - no wheezing  Cardiovascular: RRR - no M or rub  Abdomen: NT/ND, soft, bs+, no mass Extremities: No significant cyanosis, clubbing, or edema B LE   CBC: Recent Labs  Lab 05/19/19 2023 05/23/19 0851 05/25/19 0102 05/26/19 0050  WBC 7.9 16.0* 12.1* 8.8  NEUTROABS 5.7 14.5* 10.7*  --   HGB 13.7 12.5 11.4* 12.6  HCT 43.8 38.5 36.4 40.1  MCV 81.6 80.0 80.5 79.9*  PLT 246 317 437* 245*   Basic Metabolic Panel: Recent Labs  Lab 05/23/19 0851 05/24/19 0250 05/26/19 0050  NA 135 139 140  K 3.5 3.7 3.7  CL 102 109 103  CO2 21* 19* 25  GLUCOSE 146* 204* 133*  BUN 14 16 29*  CREATININE 0.90 0.83 0.97  CALCIUM 8.5* 8.0* 8.4*   GFR: Estimated Creatinine Clearance: 47.8 mL/min (by C-G formula based on SCr of 0.97 mg/dL).  Liver Function Tests: Recent Labs  Lab 05/19/19 2023 05/23/19 0851 05/24/19 0250 05/26/19 0050  AST 57* 49* 33 31  ALT 35 43 36 30  ALKPHOS 67 58 54 70  BILITOT 0.8 1.0 0.4 0.6  PROT 7.5 7.7 6.8 6.8  ALBUMIN 3.8 3.5 2.9* 3.1*    HbA1C: HbA1c, POC (controlled diabetic range)  Date/Time Value Ref Range Status   08/11/2018 04:03 PM 6.4 0.0 - 7.0 % Final  03/31/2018 10:54 AM 6.4 0.0 - 7.0 % Final   Hgb A1c MFr Bld  Date/Time Value Ref Range Status  05/24/2019 02:50 AM 7.4 (H) 4.8 - 5.6 % Final    Comment:    (NOTE) Pre diabetes:          5.7%-6.4% Diabetes:              >6.4% Glycemic control for   <7.0% adults with diabetes   02/23/2019 02:18 PM 6.7 (H) 4.8 - 5.6 % Final    Comment:             Prediabetes: 5.7 - 6.4          Diabetes: >6.4          Glycemic  control for adults with diabetes: <7.0     CBG: Recent Labs  Lab 05/24/19 2113 05/25/19 0751 05/25/19 1132 05/25/19 1631 05/25/19 2150  GLUCAP 163* 168* 118* 204* 144*    Recent Results (from the past 240 hour(s))  Blood Culture (routine x 2)     Status: None (Preliminary result)   Collection Time: 05/23/19  8:51 AM   Specimen: BLOOD  Result Value Ref Range Status   Specimen Description   Final    BLOOD LEFT ANTECUBITAL Performed at [facility], [place] [address], [place], [place] [address]    Special Requests   Final    BOTTLES DRAWN AEROBIC AND ANAEROBIC Blood Culture adequate volume Performed at [facility] [address] [place], [place] [address]    Culture   Final    NO GROWTH 3 DAYS Performed at [facility], [place] [address] [place], [place] [address]    Report Status PENDING  Incomplete  Blood Culture (routine x 2)     Status: None (Preliminary result)   Collection Time: 05/23/19  8:56 AM   Specimen: BLOOD RIGHT FOREARM  Result Value Ref Range Status   Specimen Description   Final    BLOOD RIGHT FOREARM Performed at [facility] [address], [place], [place] [address]    Special Requests   Final    BOTTLES DRAWN AEROBIC AND ANAEROBIC Blood Culture adequate volume Performed at [facility] [address] [place], [place] [address]    Culture   Final    NO GROWTH 3 DAYS Performed at [facility], [place] [address],  [place], [place] [address]    Report Status PENDING  Incomplete  MRSA PCR Screening     Status: None   Collection Time: 05/23/19  2:55 PM   Specimen: Nasal Mucosa; Nasopharyngeal  Result Value Ref Range Status   MRSA by PCR NEGATIVE NEGATIVE Final    Comment:        The GeneXpert MRSA Assay (FDA approved for NASAL specimens only), is one component of a comprehensive MRSA colonization surveillance program. It is not intended to diagnose MRSA infection nor to guide or monitor treatment for MRSA infections. Performed at [facility], [place] [address] [place], [place] [address]      Scheduled Meds:  Chlorhexidine Gluconate Cloth  6 each Topical Q0600   dexamethasone  6 mg Oral Daily   enoxaparin (LOVENOX) injection  40 mg Subcutaneous Q12H   fluticasone  2 spray Each Nare Daily   fluticasone  1 puff Inhalation BID   insulin aspart  0-15 Units Subcutaneous TID WC   loratadine  10 mg Oral Daily   mouth rinse  15 mL Mouth Rinse BID   pantoprazole  40 mg Oral Daily   sodium chloride flush  3 mL Intravenous Q12H   Continuous Infusions:  sodium chloride     remdesivir 100 mg in NS 250 mL 100 mg (05/25/19 1716)     LOS: 3 days   Cherene Altes, MD [facility] Office  [phone] Pager - Text Page per Shea Evans  If 7PM-7AM, please contact night-coverage per Amion 05/26/2019, 7:25 AM

## 2019-05-27 ENCOUNTER — Inpatient Hospital Stay (HOSPITAL_COMMUNITY): Payer: PPO

## 2019-05-27 LAB — COMPREHENSIVE METABOLIC PANEL
ALT: 33 U/L (ref 0–44)
AST: 38 U/L (ref 15–41)
Albumin: 3 g/dL — ABNORMAL LOW (ref 3.5–5.0)
Alkaline Phosphatase: 66 U/L (ref 38–126)
Anion gap: 9 (ref 5–15)
BUN: 31 mg/dL — ABNORMAL HIGH (ref 8–23)
CO2: 24 mmol/L (ref 22–32)
Calcium: 8.5 mg/dL — ABNORMAL LOW (ref 8.9–10.3)
Chloride: 104 mmol/L (ref 98–111)
Creatinine, Ser: 0.91 mg/dL (ref 0.44–1.00)
GFR calc Af Amer: >60 mL/min (ref >60)
GFR calc non Af Amer: >60 mL/min (ref >60)
Glucose, Bld: 128 mg/dL — ABNORMAL HIGH (ref 70–99)
Potassium: 3.6 mmol/L (ref 3.5–5.1)
Sodium: 137 mmol/L (ref 135–145)
Total Bilirubin: 1 mg/dL (ref 0.3–1.2)
Total Protein: 6.6 g/dL (ref 6.5–8.1)

## 2019-05-27 LAB — C-REACTIVE PROTEIN: CRP: 2 mg/dL — ABNORMAL HIGH (ref <1.0)

## 2019-05-27 LAB — CBC
HCT: 39.6 % (ref 36.0–46.0)
Hemoglobin: 12.6 g/dL (ref 12.0–15.0)
MCH: 25.1 pg — ABNORMAL LOW (ref 26.0–34.0)
MCHC: 31.8 g/dL (ref 30.0–36.0)
MCV: 78.9 fL — ABNORMAL LOW (ref 80.0–100.0)
Platelets: 506 10*3/uL — ABNORMAL HIGH (ref 150–400)
RBC: 5.02 MIL/uL (ref 3.87–5.11)
RDW: 15.9 % — ABNORMAL HIGH (ref 11.5–15.5)
WBC: 7.9 10*3/uL (ref 4.0–10.5)
nRBC: 0 % (ref 0.0–0.2)

## 2019-05-27 LAB — D-DIMER, QUANTITATIVE: D-Dimer, Quant: 5.43 ug/mL-FEU — ABNORMAL HIGH (ref 0.00–0.50)

## 2019-05-27 LAB — FERRITIN: Ferritin: 294 ng/mL (ref 11–307)

## 2019-05-27 LAB — GLUCOSE, CAPILLARY
Glucose-Capillary: 162 mg/dL — ABNORMAL HIGH (ref 70–99)
Glucose-Capillary: 152 mg/dL — ABNORMAL HIGH (ref 70–99)

## 2019-05-27 MED ORDER — DIPHENHYDRAMINE HCL 50 MG/ML IJ SOLN
50.0000 mg | Freq: Once | INTRAMUSCULAR | Status: AC
  Administered 2019-05-27: 17:00:00 50 mg via INTRAVENOUS
  Filled 2019-05-27: qty 1

## 2019-05-27 MED ORDER — HYDROCORTISONE NA SUCCINATE PF 250 MG IJ SOLR
200.0000 mg | Freq: Once | INTRAMUSCULAR | Status: AC
  Administered 2019-05-27: 12:00:00 200 mg via INTRAVENOUS
  Filled 2019-05-27: qty 200

## 2019-05-27 MED ORDER — DIPHENHYDRAMINE HCL 25 MG PO CAPS: 50.0000 mg | ORAL_CAPSULE | Freq: Once | ORAL | Status: AC

## 2019-05-27 MED ORDER — ENOXAPARIN SODIUM 40 MG/0.4ML ~~LOC~~ SOLN
40.0000 mg | Freq: Two times a day (BID) | SUBCUTANEOUS | Status: DC
  Administered 2019-05-27 – 2019-05-29 (×5): 40 mg via SUBCUTANEOUS
  Filled 2019-05-27 (×5): qty 0.4

## 2019-05-27 MED ORDER — IOHEXOL 350 MG/ML SOLN
100.0000 mL | Freq: Once | INTRAVENOUS | Status: AC | PRN
  Administered 2019-05-27: 100 mL via INTRAVENOUS

## 2019-05-27 NOTE — Progress Notes (Signed)
Patient daughter called, notified of plan of care and answered all questions.

## 2019-05-27 NOTE — Progress Notes (Signed)
[place] [facility] for Lovenox Indication: VTE prophylaxis  Allergies  Allergen Reactions  . Codeine Itching and Hives  . Minocycline Other (See Comments)    Dizziness , "strange feeling"  . Contrast Media [Iodinated Diagnostic Agents] Other (See Comments)    BP dropped  . Levaquin [Levofloxacin] Other (See Comments)    Hallucinations     Patient Measurements: Height: 5\' 1"  (154.9 cm) Weight: 143 lb 1.3 oz (64.9 kg) IBW/kg (Calculated) : 47.8  Vital Signs: Temp: 98.2 F (36.8 C) (08/02 1715) Temp Source: Oral (08/02 1715) BP: 112/62 (08/02 1800) Pulse Rate: 92 (08/02 1800)  Labs: Recent Labs    05/25/19 0102 05/26/19 0050 05/26/19 0744 05/26/19 1000 05/27/19 0510  HGB 11.4* 12.6  --   --  12.6  HCT 36.4 40.1  --   --  39.6  PLT 437* 501*  --   --  506*  CREATININE  --  0.97  --   --  0.91  TROPONINIHS  --   --  6 6  --     Estimated Creatinine Clearance: 51 mL/min (by C-G formula based on SCr of 0.91 mg/dL).   Medical History: Past Medical History:  Diagnosis Date  . Asthma   . Cataract   . Diabetes mellitus without complication ([place])   . Diverticulitis   . Environmental allergies   . Lichen planus     Assessment: 15 yoF admitted on 7/29 with COVID-19 pneumonia.  Today she had Code Blue w/ bradycardia requiring atropine administration.  Pharmacy is consulted to dose Lovenox for r/o PE, ACS.  Today, 05/27/2019 CT neg for PE, MD consulted Rx to adjust lovenox to intermediate dose for elevated d-dimer, no PE on CTa  Weight 65 kg, BMI 27  D-dimer 5.43  SCr 0.91, CrCl > 30 ml/min  CBC Hgb 12.6, Plt 506  No bleeding reported  Goal of Therapy:  Monitor platelets by anticoagulation protocol: Yes   Plan:  Lovenox 40mg  SQ Q12h. Follow up renal function, CBC.  Peggyann Juba, PharmD, BCPS Pharmacy: [phone] Clinical pharmacist phone 7am- 5pm: [phone] 05/27/2019 7:15 PM

## 2019-05-27 NOTE — Progress Notes (Signed)
[facility] PCCM  Records reviewed Quiet night Remains somewhat bradycardic, hemodynamically stable, minimal oxygen needs PCCM available prn  Roselie Awkward, MD [place] PCCM Pager: [phone] Cell: [phone] If no response, call [phone]

## 2019-05-27 NOTE — Plan of Care (Signed)
Pt is resting comfortably moving independently from side lying to prone. Tylenol was effective in treating the pt's discomfort in her back.

## 2019-05-27 NOTE — Progress Notes (Signed)
[facility] TEAM 1 - Stepdown/ICU TEAM  Diana Roach  MEQ:[id number] DOB: 02/23/1951 DOA: 05/23/2019 PCP: Leeanne Rio, MD    Brief Narrative:  68 year old with a history of asthma and DM who was diagnosed with COVID-19 on July 23 and presented to the ED 7/29 with complaints of worsening shortness of breath.  She was found to have a temperature of 102 and was saturating 80% on room air.  A chest x-ray noted hazy bibasilar opacities.  She originally presented to the ED on July 25th with shortness of breath but at that time was stable enough to be discharged home on prednisone.  Her testing was accomplished via the [facility] testing center as arranged by her PCP on July 23.   Significant Events: 7/18 developed symptoms after exposure to + 34 year old grandson who lives with her 7/23 outpatient COVID test positive 7/25 evaluated in ED and sent home 7/29 admit to [facility] via ED 7/30 transfer to [facility] 8/1 Code Blue - 4.5 sec pause - atropine - no CPR - transfer to ICU   COVID-19 specific Treatment: Remdesivir 7/29 > Actemra 7/29 Steroids 7/29 >  Subjective: The patient is resting comfortably in the ICU.  She is experiencing no significant respiratory distress.  She presently denies chest pain nausea vomiting or abdominal pain.  She reports that she is feeling better today.  Assessment & Plan:  COVID Pneumonia - acute hypoxic respiratory failure continue Remdesivir and steroids - she has already been dosed with Actemra -appears to be stabilizing somewhat from a respiratory standpoint -continue to wean oxygen as able -stable for transfer to progressive care bed  Recent Labs    05/25/19 0102 05/26/19 0050 05/27/19 0510  DDIMER  --  6.83* 5.43*  FERRITIN 280 274 294  CRP 8.3* 4.1* 2.0*    Cardiac Pause - Bradycardia Bradycardia persists but the patient is tolerating it well with a preserved blood pressure -electrolytes and TSH normal -EKG reveals sinus bradycardia with  no other worrisome findings -troponin unremarkable - continue to follow on telemetry  Rule out pulmonary embolism Given the patient's elevated d-dimer and the fact that she suffered a syncopal spell shortly after standing I feel it is prudent to rule out pulmonary embolism -she is currently on full dose Lovenox -she will undergo CTangio of the chest today  DM 2 A1c is 7.4 -continue sliding scale insulin while patient is being dosed with steroids -no change in treatment today  Asthma Quiescent at this time  DVT prophylaxis: lovenox full dose while PE / ACS being ruled out  Code Status: FULL CODE Family Communication:  Disposition Plan: Transfer to progressive care unit -follow-up CT angiogram of chest  Consultants:  none  Antimicrobials:  Azithromycin 7/29 >7/30 Ceftriaxone 7/29  Objective: Blood pressure 103/66, pulse (!) 59, temperature 98.6 F (37 C), temperature source Oral, resp. rate (!) 23, height 5\' 1"  (1.549 m), weight 64.9 kg, SpO2 (!) 87 %. No intake or output data in the 24 hours ending 05/27/19 0847 Filed Weights   05/23/19 1442  Weight: 64.9 kg    Examination: General: Alert and conversant in no respiratory distress Lungs: Fine crackles diffusely bilaterally with no wheezing Cardiovascular: Bradycardic but regular with no rub or murmur Abdomen: NT/ND, soft, bs+, no mass Extremities: Trace bilateral lower extremity edema without calf tenderness  CBC: Recent Labs  Lab 05/23/19 0851 05/25/19 0102 05/26/19 0050 05/27/19 0510  WBC 16.0* 12.1* 8.8 7.9  NEUTROABS 14.5* 10.7*  --   --  HGB 12.5 11.4* 12.6 12.6  HCT 38.5 36.4 40.1 39.6  MCV 80.0 80.5 79.9* 78.9*  PLT 317 437* 501* 096*   Basic Metabolic Panel: Recent Labs  Lab 05/24/19 0250 05/26/19 0050 05/27/19 0510  NA 139 140 137  K 3.7 3.7 3.6  CL 109 103 104  CO2 19* 25 24  GLUCOSE 204* 133* 128*  BUN 16 29* 31*  CREATININE 0.83 0.97 0.91  CALCIUM 8.0* 8.4* 8.5*  MG  --  2.1  --     GFR: Estimated Creatinine Clearance: 51 mL/min (by C-G formula based on SCr of 0.91 mg/dL).  Liver Function Tests: Recent Labs  Lab 05/23/19 0851 05/24/19 0250 05/26/19 0050 05/27/19 0510  AST 49* 33 31 38  ALT 43 36 30 33  ALKPHOS 58 54 70 66  BILITOT 1.0 0.4 0.6 1.0  PROT 7.7 6.8 6.8 6.6  ALBUMIN 3.5 2.9* 3.1* 3.0*    HbA1C: HbA1c, POC (controlled diabetic range)  Date/Time Value Ref Range Status  08/11/2018 04:03 PM 6.4 0.0 - 7.0 % Final  03/31/2018 10:54 AM 6.4 0.0 - 7.0 % Final   Hgb A1c MFr Bld  Date/Time Value Ref Range Status  05/24/2019 02:50 AM 7.4 (H) 4.8 - 5.6 % Final    Comment:    (NOTE) Pre diabetes:          5.7%-6.4% Diabetes:              >6.4% Glycemic control for   <7.0% adults with diabetes   02/23/2019 02:18 PM 6.7 (H) 4.8 - 5.6 % Final    Comment:             Prediabetes: 5.7 - 6.4          Diabetes: >6.4          Glycemic control for adults with diabetes: <7.0     CBG: Recent Labs  Lab 05/25/19 1631 05/25/19 2150 05/26/19 0723 05/26/19 1315 05/26/19 1651  GLUCAP 204* 144* 117* 104* 144*    Recent Results (from the past 240 hour(s))  Blood Culture (routine x 2)     Status: None (Preliminary result)   Collection Time: 05/23/19  8:51 AM   Specimen: BLOOD  Result Value Ref Range Status   Specimen Description   Final    BLOOD LEFT ANTECUBITAL Performed at [facility], [place] [address] [place], [place] [address]    Special Requests   Final    BOTTLES DRAWN AEROBIC AND ANAEROBIC Blood Culture adequate volume Performed at [place] [address], [place], [place] [address]    Culture   Final    NO GROWTH 4 DAYS Performed at [facility], [place] [address] [place], [place] [address]    Report Status PENDING  Incomplete  Blood Culture (routine x 2)     Status: None (Preliminary result)   Collection Time: 05/23/19  8:56 AM   Specimen: BLOOD RIGHT FOREARM  Result Value Ref Range Status    Specimen Description   Final    BLOOD RIGHT FOREARM Performed at [place] [address], [place], [place] [address]    Special Requests   Final    BOTTLES DRAWN AEROBIC AND ANAEROBIC Blood Culture adequate volume Performed at [place] [address], [place], [place] [address]    Culture   Final    NO GROWTH 4 DAYS Performed at [facility], [place] [address], [place], [place]  [address]    Report Status PENDING  Incomplete  MRSA PCR Screening     Status: None   Collection Time: 05/23/19  2:55 PM   Specimen: Nasal Mucosa; Nasopharyngeal  Result Value Ref Range Status   MRSA by PCR NEGATIVE NEGATIVE Final    Comment:        The GeneXpert MRSA Assay (FDA approved for NASAL specimens only), is one component of a comprehensive MRSA colonization surveillance program. It is not intended to diagnose MRSA infection nor to guide or monitor treatment for MRSA infections. Performed at [facility], [place] [address], [place], [place] [address]      Scheduled Meds:  aspirin  81 mg Per Tube Daily   Chlorhexidine Gluconate Cloth  6 each Topical Q0600   dexamethasone  6 mg Oral Daily   enoxaparin (LOVENOX) injection  65 mg Subcutaneous Q12H   feeding supplement (ENSURE ENLIVE)  237 mL Oral BID BM   fluticasone  2 spray Each Nare Daily   fluticasone  1 puff Inhalation BID   insulin aspart  0-15 Units Subcutaneous TID WC   loratadine  10 mg Oral Daily   mouth rinse  15 mL Mouth Rinse BID   pantoprazole  40 mg Oral Daily   sodium chloride flush  3 mL Intravenous Q12H   Continuous Infusions:  sodium chloride     remdesivir 100 mg in NS 250 mL       LOS: 4 days   Cherene Altes, MD [facility] Office  [phone] Pager - Text Page per Shea Evans  If 7PM-7AM, please contact night-coverage per Amion 05/27/2019, 8:47 AM

## 2019-05-27 NOTE — Progress Notes (Signed)
Patient transferred from ICU, oriented to unit and plan for shift.  Call bell within reach.  Dietary notified patient will need tray.

## 2019-05-28 LAB — CULTURE, BLOOD (ROUTINE X 2)
Culture: NO GROWTH
Culture: NO GROWTH
Special Requests: ADEQUATE
Special Requests: ADEQUATE

## 2019-05-28 LAB — COMPREHENSIVE METABOLIC PANEL
ALT: 29 U/L (ref 0–44)
AST: 28 U/L (ref 15–41)
Albumin: 2.9 g/dL — ABNORMAL LOW (ref 3.5–5.0)
Alkaline Phosphatase: 59 U/L (ref 38–126)
Anion gap: 11 (ref 5–15)
BUN: 28 mg/dL — ABNORMAL HIGH (ref 8–23)
CO2: 22 mmol/L (ref 22–32)
Calcium: 8.4 mg/dL — ABNORMAL LOW (ref 8.9–10.3)
Chloride: 105 mmol/L (ref 98–111)
Creatinine, Ser: 0.86 mg/dL (ref 0.44–1.00)
GFR calc Af Amer: >60 mL/min (ref >60)
GFR calc non Af Amer: >60 mL/min (ref >60)
Glucose, Bld: 166 mg/dL — ABNORMAL HIGH (ref 70–99)
Potassium: 4 mmol/L (ref 3.5–5.1)
Sodium: 138 mmol/L (ref 135–145)
Total Bilirubin: 0.5 mg/dL (ref 0.3–1.2)
Total Protein: 5.8 g/dL — ABNORMAL LOW (ref 6.5–8.1)

## 2019-05-28 LAB — D-DIMER, QUANTITATIVE: D-Dimer, Quant: 3.35 ug/mL-FEU — ABNORMAL HIGH (ref 0.00–0.50)

## 2019-05-28 LAB — CBC
HCT: 37 % (ref 36.0–46.0)
Hemoglobin: 11.7 g/dL — ABNORMAL LOW (ref 12.0–15.0)
MCH: 25.1 pg — ABNORMAL LOW (ref 26.0–34.0)
MCHC: 31.6 g/dL (ref 30.0–36.0)
MCV: 79.4 fL — ABNORMAL LOW (ref 80.0–100.0)
Platelets: 459 10*3/uL — ABNORMAL HIGH (ref 150–400)
RBC: 4.66 MIL/uL (ref 3.87–5.11)
RDW: 15.8 % — ABNORMAL HIGH (ref 11.5–15.5)
WBC: 8.7 10*3/uL (ref 4.0–10.5)
nRBC: 0 % (ref 0.0–0.2)

## 2019-05-28 LAB — FERRITIN: Ferritin: 265 ng/mL (ref 11–307)

## 2019-05-28 LAB — GLUCOSE, CAPILLARY
Glucose-Capillary: 258 mg/dL — ABNORMAL HIGH (ref 70–99)
Glucose-Capillary: 234 mg/dL — ABNORMAL HIGH (ref 70–99)
Glucose-Capillary: 125 mg/dL — ABNORMAL HIGH (ref 70–99)
Glucose-Capillary: 182 mg/dL — ABNORMAL HIGH (ref 70–99)
Glucose-Capillary: 240 mg/dL — ABNORMAL HIGH (ref 70–99)
Glucose-Capillary: 240 mg/dL — ABNORMAL HIGH (ref 70–99)

## 2019-05-28 LAB — C-REACTIVE PROTEIN: CRP: 1.1 mg/dL — ABNORMAL HIGH (ref <1.0)

## 2019-05-28 NOTE — Progress Notes (Signed)
[place] TEAM 1 - Stepdown/ICU TEAM  Diana Roach  VHQ:[id number] DOB: 19-Jul-1951 DOA: 05/23/2019 PCP: Leeanne Rio, MD    Brief Narrative:  68 year old with a history of asthma and DM who was diagnosed with COVID-19 on July 23 and presented to the ED 7/29 with complaints of worsening shortness of breath.  She was found to have a temperature of 102 and was saturating 80% on room air.  A chest x-ray noted hazy bibasilar opacities.  She originally presented to the ED on July 25th with shortness of breath but at that time was stable enough to be discharged home on prednisone.  Her testing was accomplished via the [facility] testing center as arranged by her PCP on July 23.   Significant Events: 7/18 developed symptoms after exposure to + 1 year old grandson who lives with her 7/23 outpatient COVID test positive 7/25 evaluated in ED and sent home 7/29 admit to [facility] via ED 7/30 transfer to [facility] 8/1 Code Blue - 4.5 sec pause - atropine - no CPR - transfer to ICU  8/2 transfer to PCU - CTa negative for PE   COVID-19 specific Treatment: Remdesivir 7/29 > 8/2 Actemra 7/29 Steroids 7/29 >  Subjective: Is requiring high flow nasal cannula at 8 L but maintaining sats well with the support.  Is alert and conversant and in good spirits though she states she is very tired.  She denies chest pain nausea vomiting or abdominal pain.  She tells me she is a little bit afraid that she will "pass out again" when she gets up to move.  Assessment & Plan:  COVID Pneumonia - acute hypoxic respiratory failure the patient has completed a course of Remdesivir -steroids continue - she has already been dosed with Actemra - appears to be stabilizing somewhat from a respiratory standpoint -continue to wean oxygen as able  Recent Labs    05/26/19 0050 05/27/19 0510 05/28/19 0125  DDIMER 6.83* 5.43* 3.35*  FERRITIN 274 294 265  CRP 4.1* 2.0* 1.1*   Recent Labs  Lab 05/23/19 0851  05/25/19 0102  PROCALCITON 0.10 0.11    Cardiac Pause - Bradycardia Bradycardia persists but the patient is tolerating it well with a preserved blood pressure - electrolytes and TSH normal -EKG reveals sinus bradycardia with no other worrisome findings - troponin unremarkable - continue to follow on telemetry  Rule out pulmonary embolism Fortunately there was no evidence of a pulmonary embolism on the patient's CT angiogram -Lovenox dose has been reduced to prophylactic dose  DM 2 A1c is 7.4 -continue sliding scale insulin while patient is being dosed with steroids -no change in treatment today  Asthma Quiescent at this time  DVT prophylaxis: lovenox intermediate dose  Code Status: FULL CODE Family Communication:  Disposition Plan: Progressive care unit   Consultants:  none  Antimicrobials:  Azithromycin 7/29 >7/30 Ceftriaxone 7/29  Objective: Blood pressure 132/80, pulse 60, temperature 98.2 F (36.8 C), temperature source Oral, resp. rate 16, height 5\' 1"  (1.549 m), weight 64.9 kg, SpO2 95 %.  Intake/Output Summary (Last 24 hours) at 05/28/2019 0905 Last data filed at 05/28/2019 0800 Gross per 24 hour  Intake 1343.93 ml  Output 575 ml  Net 768.93 ml   Filed Weights   05/23/19 1442  Weight: 64.9 kg    Examination: General: Alert and conversant in no respiratory distress Lungs: Fine crackles diffusely th/o - no wheezing  Cardiovascular: RRR w/ HR 70 Abdomen: NT/ND, soft, bs+, no mass Extremities: Trace bilateral  lower extremity edema w/o change   CBC: Recent Labs  Lab 05/23/19 0851 05/25/19 0102 05/26/19 0050 05/27/19 0510 05/28/19 0125  WBC 16.0* 12.1* 8.8 7.9 8.7  NEUTROABS 14.5* 10.7*  --   --   --   HGB 12.5 11.4* 12.6 12.6 11.7*  HCT 38.5 36.4 40.1 39.6 37.0  MCV 80.0 80.5 79.9* 78.9* 79.4*  PLT 317 437* 501* 506* 161*   Basic Metabolic Panel: Recent Labs  Lab 05/26/19 0050 05/27/19 0510 05/28/19 0125  NA 140 137 138  K 3.7 3.6 4.0  CL 103  104 105  CO2 25 24 22   GLUCOSE 133* 128* 166*  BUN 29* 31* 28*  CREATININE 0.97 0.91 0.86  CALCIUM 8.4* 8.5* 8.4*  MG 2.1  --   --    GFR: Estimated Creatinine Clearance: 54 mL/min (by C-G formula based on SCr of 0.86 mg/dL).  Liver Function Tests: Recent Labs  Lab 05/24/19 0250 05/26/19 0050 05/27/19 0510 05/28/19 0125  AST 33 31 38 28  ALT 36 30 33 29  ALKPHOS 54 70 66 59  BILITOT 0.4 0.6 1.0 0.5  PROT 6.8 6.8 6.6 5.8*  ALBUMIN 2.9* 3.1* 3.0* 2.9*    HbA1C: HbA1c, POC (controlled diabetic range)  Date/Time Value Ref Range Status  08/11/2018 04:03 PM 6.4 0.0 - 7.0 % Final  03/31/2018 10:54 AM 6.4 0.0 - 7.0 % Final   Hgb A1c MFr Bld  Date/Time Value Ref Range Status  05/24/2019 02:50 AM 7.4 (H) 4.8 - 5.6 % Final    Comment:    (NOTE) Pre diabetes:          5.7%-6.4% Diabetes:              >6.4% Glycemic control for   <7.0% adults with diabetes   02/23/2019 02:18 PM 6.7 (H) 4.8 - 5.6 % Final    Comment:             Prediabetes: 5.7 - 6.4          Diabetes: >6.4          Glycemic control for adults with diabetes: <7.0     CBG: Recent Labs  Lab 05/27/19 1048 05/27/19 1430 05/27/19 1721 05/27/19 2145 05/28/19 0807  GLUCAP 162* 152* 258* 234* 125*    Recent Results (from the past 240 hour(s))  Blood Culture (routine x 2)     Status: None (Preliminary result)   Collection Time: 05/23/19  8:51 AM   Specimen: BLOOD  Result Value Ref Range Status   Specimen Description   Final    BLOOD LEFT ANTECUBITAL Performed at [facility], [place] [address] [place], [place] [address]    Special Requests   Final    BOTTLES DRAWN AEROBIC AND ANAEROBIC Blood Culture adequate volume Performed at [place] [address], [place], [place] [address]    Culture   Final    NO GROWTH 4 DAYS Performed at [facility], [place] [address], [place], [place] [address]    Report Status PENDING  Incomplete  Blood Culture (routine x 2)      Status: None (Preliminary result)   Collection Time: 05/23/19  8:56 AM   Specimen: BLOOD RIGHT FOREARM  Result Value Ref Range Status   Specimen Description   Final    BLOOD RIGHT FOREARM Performed at [place] [address] [place], [place] [address]    Special Requests   Final    BOTTLES  DRAWN AEROBIC AND ANAEROBIC Blood Culture adequate volume Performed at [place] [address], [place], [place] [address]    Culture   Final    NO GROWTH 4 DAYS Performed at [facility], [place] [address] [place], [place] [address]    Report Status PENDING  Incomplete  MRSA PCR Screening     Status: None   Collection Time: 05/23/19  2:55 PM   Specimen: Nasal Mucosa; Nasopharyngeal  Result Value Ref Range Status   MRSA by PCR NEGATIVE NEGATIVE Final    Comment:        The GeneXpert MRSA Assay (FDA approved for NASAL specimens only), is one component of a comprehensive MRSA colonization surveillance program. It is not intended to diagnose MRSA infection nor to guide or monitor treatment for MRSA infections. Performed at [facility], [place] [address] [place], [place] [address]      Scheduled Meds:  aspirin  81 mg Per Tube Daily   Chlorhexidine Gluconate Cloth  6 each Topical Q0600   dexamethasone  6 mg Oral Daily   enoxaparin (LOVENOX) injection  40 mg Subcutaneous Q12H   feeding supplement (ENSURE ENLIVE)  237 mL Oral BID BM   fluticasone  2 spray Each Nare Daily   fluticasone  1 puff Inhalation BID   insulin aspart  0-15 Units Subcutaneous TID WC   loratadine  10 mg Oral Daily   mouth rinse  15 mL Mouth Rinse BID   pantoprazole  40 mg Oral Daily   sodium chloride flush  3 mL Intravenous Q12H     LOS: 5 days   Cherene Altes, MD [facility] Office  [phone] Pager - Text Page per Shea Evans  If 7PM-7AM, please contact night-coverage per Amion 05/28/2019, 9:05 AM

## 2019-05-28 NOTE — Progress Notes (Signed)
Called and updated patient's daughter, Olin Hauser, on patient's condition during the night.  All questions answered.  Earleen Reaper RN

## 2019-05-28 NOTE — Progress Notes (Signed)
Telephone call to patient's contact, Huston Foley, updated plan of care and answered all questions.

## 2019-05-29 LAB — COMPREHENSIVE METABOLIC PANEL
ALT: 29 U/L (ref 0–44)
AST: 28 U/L (ref 15–41)
Albumin: 3 g/dL — ABNORMAL LOW (ref 3.5–5.0)
Alkaline Phosphatase: 60 U/L (ref 38–126)
Anion gap: 11 (ref 5–15)
BUN: 23 mg/dL (ref 8–23)
CO2: 24 mmol/L (ref 22–32)
Calcium: 8.8 mg/dL — ABNORMAL LOW (ref 8.9–10.3)
Chloride: 102 mmol/L (ref 98–111)
Creatinine, Ser: 0.86 mg/dL (ref 0.44–1.00)
GFR calc Af Amer: >60 mL/min (ref >60)
GFR calc non Af Amer: >60 mL/min (ref >60)
Glucose, Bld: 177 mg/dL — ABNORMAL HIGH (ref 70–99)
Potassium: 4.1 mmol/L (ref 3.5–5.1)
Sodium: 137 mmol/L (ref 135–145)
Total Bilirubin: 0.4 mg/dL (ref 0.3–1.2)
Total Protein: 5.9 g/dL — ABNORMAL LOW (ref 6.5–8.1)

## 2019-05-29 LAB — CBC
HCT: 37.6 % (ref 36.0–46.0)
Hemoglobin: 11.7 g/dL — ABNORMAL LOW (ref 12.0–15.0)
MCH: 25.1 pg — ABNORMAL LOW (ref 26.0–34.0)
MCHC: 31.1 g/dL (ref 30.0–36.0)
MCV: 80.7 fL (ref 80.0–100.0)
Platelets: 498 10*3/uL — ABNORMAL HIGH (ref 150–400)
RBC: 4.66 MIL/uL (ref 3.87–5.11)
RDW: 15.8 % — ABNORMAL HIGH (ref 11.5–15.5)
WBC: 10 10*3/uL (ref 4.0–10.5)
nRBC: 0 % (ref 0.0–0.2)

## 2019-05-29 LAB — D-DIMER, QUANTITATIVE: D-Dimer, Quant: 2.82 ug/mL-FEU — ABNORMAL HIGH (ref 0.00–0.50)

## 2019-05-29 LAB — C-REACTIVE PROTEIN: CRP: 1.1 mg/dL — ABNORMAL HIGH (ref <1.0)

## 2019-05-29 LAB — GLUCOSE, CAPILLARY
Glucose-Capillary: 119 mg/dL — ABNORMAL HIGH (ref 70–99)
Glucose-Capillary: 188 mg/dL — ABNORMAL HIGH (ref 70–99)
Glucose-Capillary: 231 mg/dL — ABNORMAL HIGH (ref 70–99)
Glucose-Capillary: 219 mg/dL — ABNORMAL HIGH (ref 70–99)

## 2019-05-29 LAB — FERRITIN: Ferritin: 262 ng/mL (ref 11–307)

## 2019-05-29 MED ORDER — ASPIRIN 81 MG PO CHEW: 81.0000 mg | CHEWABLE_TABLET | Freq: Every day | ORAL | Status: DC

## 2019-05-29 MED ORDER — POLYETHYLENE GLYCOL 3350 17 G PO PACK
17.0000 g | PACK | Freq: Two times a day (BID) | ORAL | Status: DC
  Administered 2019-05-29 – 2019-05-31 (×5): 17 g via ORAL
  Filled 2019-05-29 (×5): qty 1

## 2019-05-29 MED ORDER — SENNOSIDES-DOCUSATE SODIUM 8.6-50 MG PO TABS
1.0000 | ORAL_TABLET | Freq: Two times a day (BID) | ORAL | Status: DC
  Administered 2019-05-29 – 2019-05-31 (×5): 1 via ORAL
  Filled 2019-05-29 (×5): qty 1

## 2019-05-29 MED ORDER — FLUTICASONE PROPIONATE 50 MCG/ACT NA SUSP
2.0000 | Freq: Every day | NASAL | Status: DC
  Administered 2019-05-30 – 2019-06-04 (×6): 2 via NASAL

## 2019-05-29 NOTE — Care Management Important Message (Signed)
Important Message  Patient Details  Name: Diana Roach MRN: [id number] Date of Birth: 06-02-1951   Medicare Important Message Given:  Yes - Important Message mailed due to current [facility]  tocim    Orbie Pyo 05/29/2019, 4:24 PM

## 2019-05-29 NOTE — Progress Notes (Signed)
[place] TEAM 1 - Stepdown/ICU TEAM  GELSEY AMYX  WFU:[id number] DOB: [birth year] DOA: 05/23/2019 PCP: Leeanne Rio, MD    Brief Narrative:  68 year old with a history of asthma and DM who was diagnosed with COVID-19 on July 23 and presented to the ED 7/29 with complaints of worsening shortness of breath.  She was found to have a temperature of 102 and was saturating 80% on room air.  A chest x-ray noted hazy bibasilar opacities.  She originally presented to the ED on July 25th with shortness of breath but at that time was stable enough to be discharged home on prednisone.  Her testing was accomplished via the [facility] testing center as arranged by her PCP on July 23.   Significant Events: 7/18 developed symptoms after exposure to + 6 year old grandson who lives with her 7/23 outpatient COVID test positive 7/25 evaluated in ED and sent home 7/29 admit to [facility] via ED 7/30 [facility] 8/1 Code Blue - 4.5 sec pause - atropine - no CPR - transfer to ICU  8/2 transfer to PCU - CTa negative for PE   COVID-19 specific Treatment: Remdesivir 7/29 > 8/2 Actemra 7/29 Steroids 7/29 >  Subjective: The patient appears to be resting more comfortably today.  She states she is still short of breath.  She denies chest pain nausea or vomiting.  She does report some discomfort related to constipation, which she states is a chronic problem for her.  Assessment & Plan:  COVID Pneumonia - acute hypoxic respiratory failure the patient has completed a course of Remdesivir -steroids continue - has been dosed with Actemra - appears to be stabilizing somewhat from a respiratory standpoint -continue to wean oxygen as able  Recent Labs    05/27/19 0510 05/28/19 0125 05/29/19 0140  DDIMER 5.43* 3.35* 2.82*  FERRITIN 294 265 262  CRP 2.0* 1.1* 1.1*   Recent Labs  Lab 05/23/19 0851 05/25/19 0102  PROCALCITON 0.10 0.11    Cardiac Pause - Bradycardia Bradycardia persists but  the patient is tolerating it well with a preserved blood pressure - electrolytes and TSH normal - EKG revealed sinus bradycardia with no other worrisome findings - troponin unremarkable - continue to follow on telemetry  Rule out pulmonary embolism no evidence of a pulmonary embolism on the patient's CT angiogram - Lovenox dose has been reduced to prophylactic dose  DM 2 A1c is 7.4 - continue sliding scale insulin while patient is being dosed with steroids - no change in treatment today  Asthma Quiescent at this time  DVT prophylaxis: lovenox intermediate dose  Code Status: FULL CODE Family Communication:  Disposition Plan: transfer to tele status  Consultants:  none  Antimicrobials:  Azithromycin 7/29 >7/30 Ceftriaxone 7/29  Objective: Blood pressure 103/73, pulse 61, temperature 98.5 F (36.9 C), temperature source Oral, resp. rate 20, height 5\' 1"  (1.549 m), weight 64.9 kg, SpO2 93 %.  Intake/Output Summary (Last 24 hours) at 05/29/2019 0916 Last data filed at 05/29/2019 0600 Gross per 24 hour  Intake 963 ml  Output 1375 ml  Net -412 ml   Filed Weights   05/23/19 1442  Weight: 64.9 kg    Examination: General: Alert and conversant -no acute distress Lungs: Fine crackles diffusely without wheezing Cardiovascular: Bradycardic at 56 but without murmur or rub Abdomen: NT/ND, soft, bs+, no mass Extremities: No significant edema bilateral lower extremities  CBC: Recent Labs  Lab 05/23/19 0851 05/25/19 0102  05/27/19 0510 05/28/19 0125 05/29/19 0140  WBC 16.0* 12.1*   < > 7.9 8.7 10.0  NEUTROABS 14.5* 10.7*  --   --   --   --   HGB 12.5 11.4*   < > 12.6 11.7* 11.7*  HCT 38.5 36.4   < > 39.6 37.0 37.6  MCV 80.0 80.5   < > 78.9* 79.4* 80.7  PLT 317 437*   < > 506* 459* 498*   < > = values in this interval not displayed.   Basic Metabolic Panel: Recent Labs  Lab 05/26/19 0050 05/27/19 0510 05/28/19 0125 05/29/19 0140  NA 140 137 138 137  K 3.7 3.6 4.0 4.1  CL  103 104 105 102  CO2 25 24 22 24   GLUCOSE 133* 128* 166* 177*  BUN 29* 31* 28* 23  CREATININE 0.97 0.91 0.86 0.86  CALCIUM 8.4* 8.5* 8.4* 8.8*  MG 2.1  --   --   --    GFR: Estimated Creatinine Clearance: 54 mL/min (by C-G formula based on SCr of 0.86 mg/dL).  Liver Function Tests: Recent Labs  Lab 05/26/19 0050 05/27/19 0510 05/28/19 0125 05/29/19 0140  AST 31 38 28 28  ALT 30 33 29 29  ALKPHOS 70 66 59 60  BILITOT 0.6 1.0 0.5 0.4  PROT 6.8 6.6 5.8* 5.9*  ALBUMIN 3.1* 3.0* 2.9* 3.0*    HbA1C: HbA1c, POC (controlled diabetic range)  Date/Time Value Ref Range Status  08/11/2018 04:03 PM 6.4 0.0 - 7.0 % Final  03/31/2018 10:54 AM 6.4 0.0 - 7.0 % Final   Hgb A1c MFr Bld  Date/Time Value Ref Range Status  05/24/2019 02:50 AM 7.4 (H) 4.8 - 5.6 % Final    Comment:    (NOTE) Pre diabetes:          5.7%-6.4% Diabetes:              >6.4% Glycemic control for   <7.0% adults with diabetes   02/23/2019 02:18 PM 6.7 (H) 4.8 - 5.6 % Final    Comment:             Prediabetes: 5.7 - 6.4          Diabetes: >6.4          Glycemic control for adults with diabetes: <7.0     CBG: Recent Labs  Lab 05/28/19 0807 05/28/19 1158 05/28/19 1636 05/28/19 2037 05/29/19 0816  GLUCAP 125* 182* 240* 240* 119*    Recent Results (from the past 240 hour(s))  Blood Culture (routine x 2)     Status: None   Collection Time: 05/23/19  8:51 AM   Specimen: BLOOD  Result Value Ref Range Status   Specimen Description   Final    BLOOD LEFT ANTECUBITAL Performed at [facility], [place] [address], [place], [place] [address]    Special Requests   Final    BOTTLES DRAWN AEROBIC AND ANAEROBIC Blood Culture adequate volume Performed at [place] [address], [place], [place] [address]    Culture   Final    NO GROWTH 5 DAYS Performed at [facility], [place] [address] [place], [place] [address]    Report Status 05/28/2019 FINAL  Final  Blood Culture  (routine x 2)     Status: None   Collection Time: 05/23/19  8:56 AM   Specimen: BLOOD RIGHT FOREARM  Result Value Ref Range Status   Specimen Description   Final    BLOOD RIGHT FOREARM Performed at [facility], [place] [address], [place], [place] [address]    Special Requests   Final    BOTTLES DRAWN AEROBIC AND ANAEROBIC Blood Culture adequate volume Performed at [facility] [address] [place], [place] [address]    Culture   Final    NO GROWTH 5 DAYS Performed at [facility], [place] [address], [place], [place] [address]    Report Status 05/28/2019 FINAL  Final  MRSA PCR Screening     Status: None   Collection Time: 05/23/19  2:55 PM   Specimen: Nasal Mucosa; Nasopharyngeal  Result Value Ref Range Status   MRSA by PCR NEGATIVE NEGATIVE Final    Comment:        The GeneXpert MRSA Assay (FDA approved for NASAL specimens only), is one component of a comprehensive MRSA colonization surveillance program. It is not intended to diagnose MRSA infection nor to guide or monitor treatment for MRSA infections. Performed at [facility], [place] [address], [place],  [address]      Scheduled Meds:  aspirin  81 mg Per Tube Daily   Chlorhexidine Gluconate Cloth  6 each Topical Q0600   dexamethasone  6 mg Oral Daily   enoxaparin (LOVENOX) injection  40 mg Subcutaneous Q12H   feeding supplement (ENSURE ENLIVE)  237 mL Oral BID BM   fluticasone  2 spray Each Nare Daily   fluticasone  1 puff Inhalation BID   insulin aspart  0-15 Units Subcutaneous TID WC   loratadine  10 mg Oral Daily   mouth rinse  15 mL Mouth Rinse BID   pantoprazole  40 mg Oral Daily   sodium chloride flush  3 mL Intravenous Q12H     LOS: 6 days   Cherene Altes, MD [facility] Office  [phone] Pager - Text Page per Shea Evans  If 7PM-7AM, please contact night-coverage per Amion 05/29/2019, 9:16 AM

## 2019-05-30 LAB — GLUCOSE, CAPILLARY
Glucose-Capillary: 116 mg/dL — ABNORMAL HIGH (ref 70–99)
Glucose-Capillary: 218 mg/dL — ABNORMAL HIGH (ref 70–99)
Glucose-Capillary: 236 mg/dL — ABNORMAL HIGH (ref 70–99)
Glucose-Capillary: 210 mg/dL — ABNORMAL HIGH (ref 70–99)

## 2019-05-30 MED ORDER — BISACODYL 10 MG RE SUPP: 10.0000 mg | Freq: Every day | RECTAL | Status: DC | PRN

## 2019-05-30 MED ORDER — FUROSEMIDE 10 MG/ML IJ SOLN
40.0000 mg | Freq: Once | INTRAMUSCULAR | Status: AC
  Administered 2019-05-30: 40 mg via INTRAVENOUS
  Filled 2019-05-30: qty 4

## 2019-05-30 MED ORDER — ENOXAPARIN SODIUM 40 MG/0.4ML ~~LOC~~ SOLN
40.0000 mg | SUBCUTANEOUS | Status: DC
  Administered 2019-05-30 – 2019-06-03 (×5): 40 mg via SUBCUTANEOUS
  Filled 2019-05-30 (×5): qty 0.4

## 2019-05-30 MED ORDER — FLEET ENEMA 7-19 GM/118ML RE ENEM: 1.0000 | ENEMA | Freq: Every day | RECTAL | Status: DC | PRN

## 2019-05-30 NOTE — Progress Notes (Signed)
PROGRESS NOTE  Diana Roach MWN:[id number] DOB: 03/08/1951 DOA: 05/23/2019  PCP: Leeanne Rio, MD  Brief History/Interval Summary: 68 year old with a history of asthma and DM who was diagnosed with COVID-19 on July 23 and presented to the ED 7/29 with complaints of worsening shortness of breath.  She was found to have a temperature of 102 and was saturating 80% on room air.  A chest x-ray noted hazy bibasilar opacities.  She originally presented to the ED on July 25th with shortness of breath but at that time was stable enough to be discharged home on prednisone.  Presented back on 7/29 and was hospitalized.  Had a CODE BLUE arrest on 8/1 with a four-point second pause.  Was given atropine and underwent CPR was transferred to the ICU.  Subsequently transferred back to PCU.  CT angiogram was negative for PE.  Reason for Visit: Acute respiratory disease due to COVID-19  Consultants: None  Procedures: None  Antibiotics: Anti-infectives (From admission, onward)   Start     Dose/Rate Route Frequency Ordered Stop   05/27/19 1800  remdesivir 100 mg in sodium chloride 0.9 % 250 mL IVPB     100 mg 500 mL/hr over 30 Minutes Intravenous Every 24 hours 05/26/19 1826 05/27/19 1821   05/25/19 1000  azithromycin (ZITHROMAX) tablet 500 mg  Status:  Discontinued     500 mg Oral Daily 05/24/19 0955 05/24/19 1218   05/24/19 1400  remdesivir 100 mg in sodium chloride 0.9 % 250 mL IVPB     100 mg 500 mL/hr over 30 Minutes Intravenous Every 24 hours 05/23/19 1224 05/26/19 1900   05/24/19 1300  cefTRIAXone (ROCEPHIN) 1 g in sodium chloride 0.9 % 100 mL IVPB  Status:  Discontinued     1 g 200 mL/hr over 30 Minutes Intravenous Every 24 hours 05/24/19 0955 05/24/19 1218   05/23/19 1400  remdesivir 200 mg in sodium chloride 0.9 % 250 mL IVPB     200 mg 500 mL/hr over 30 Minutes Intravenous Once 05/23/19 1224 05/23/19 1658   05/23/19 1230  cefTRIAXone (ROCEPHIN) 1 g in sodium chloride 0.9 % 100 mL  IVPB  Status:  Discontinued     1 g 200 mL/hr over 30 Minutes Intravenous Every 24 hours 05/23/19 1207 05/24/19 0953   05/23/19 1230  azithromycin (ZITHROMAX) 500 mg in sodium chloride 0.9 % 250 mL IVPB  Status:  Discontinued     500 mg 250 mL/hr over 60 Minutes Intravenous Every 24 hours 05/23/19 1207 05/24/19 0953       Subjective/Interval History: Patient states that she still feels fatigued.  Denies any chest pain.  Still short of breath with cough.  Had some nasal bleeding overnight.  Sputum is blood-tinged.    Assessment/Plan:  Acute Hypoxic Resp. Failure due to Acute Covid 19 Viral Illness  COVID-19 Labs  Recent Labs    05/28/19 0125 05/29/19 0140  DDIMER 3.35* 2.82*  FERRITIN 265 262  CRP 1.1* 1.1*    Lab Results  Component Value Date   SARSCOV2NAA Detected (A) 05/15/2019     Fever: Afebrile Oxygen requirements: On HF [place] at 12 L/min Antibacterials: No longer on antibiotics Remdesivir: Completed course Steroids: Remains on dexamethasone Diuretics: Not on scheduled diuretics Actemra: 1 dose given on 7/29 DVT Prophylaxis:  Lovenox 40 mg once a day  Research Studies: Not enrolled into research studies  Patient remains tenuous from a respiratory standpoint.  She is still requiring high doses of oxygen.  Patient has  completed course of Remdesivir.  She remains on steroids.  She was also given Actemra.  Her inflammatory markers have improved.  D-dimer is mildly elevated.  CT angiogram done recently did not show any PE.  Sitter dose of furosemide.  We will check x-ray tomorrow morning.  Awake prone positioning as much as possible.  Incentive spirometry.  Mobilization.  Cardiac pause/bradycardia Patient apparently had cardiac arrest.  She had a greater than 4-second pause.  She underwent CPR and was given atropine.  Transferred to the unit.  Stabilized.  Did not have any recurrence.  She was transferred back to the floor.  Remains on telemetry.  TSH normal.   Echocardiogram done recently in May showed normal systolic function.  Continue to monitor closely.  Diabetes mellitus type 2 HbA1c 7.4.  Continue sliding scale insulin coverage.  History of asthma Stable.  DVT Prophylaxis: Lovenox PUD Prophylaxis: On Protonix Code Status: Full code Family Communication: Discussed with the patient Disposition Plan: Management as outlined above.  Not ready for discharge yet.   Medications:  Scheduled:  Chlorhexidine Gluconate Cloth  6 each Topical Q0600   dexamethasone  6 mg Oral Daily   enoxaparin (LOVENOX) injection  40 mg Subcutaneous Q24H   feeding supplement (ENSURE ENLIVE)  237 mL Oral BID BM   fluticasone  2 spray Each Nare Daily   fluticasone  1 puff Inhalation BID   insulin aspart  0-15 Units Subcutaneous TID WC   loratadine  10 mg Oral Daily   mouth rinse  15 mL Mouth Rinse BID   pantoprazole  40 mg Oral Daily   polyethylene glycol  17 g Oral BID   senna-docusate  1 tablet Oral BID   sodium chloride flush  3 mL Intravenous Q12H   Continuous:  sodium chloride Stopped (05/27/19 1932)   OQH:UTMLYY chloride, acetaminophen, albuterol, benzonatate, bisacodyl, guaiFENesin-dextromethorphan, hydrALAZINE, ondansetron **OR** ondansetron (ZOFRAN) IV, sodium chloride flush, sodium phosphate   Objective:  Vital Signs  Vitals:   05/29/19 1959 05/29/19 2349 05/30/19 0336 05/30/19 0757  BP:  116/72 97/60 119/73  Pulse: 73 66 73 69  Resp: 20 17 (!) 28 20  Temp:  98.6 F (37 C) 97.9 F (36.6 C) 97.9 F (36.6 C)  TempSrc:  Oral  Oral  SpO2: 92% 92% 90% (!) 88%  Weight:      Height:        Intake/Output Summary (Last 24 hours) at 05/30/2019 1152 Last data filed at 05/30/2019 0500 Gross per 24 hour  Intake 720 ml  Output 1750 ml  Net -1030 ml   Filed Weights   05/23/19 1442  Weight: 64.9 kg    General appearance: Awake alert.  In no distress Resp: Tachypneic.  Coarse breath sounds bilaterally with crackles bilaterally.   No wheezing or rhonchi. Cardio: S1-S2 is normal regular.  No S3-S4.  No rubs murmurs or bruit GI: Abdomen is soft.  Nontender nondistended.  Bowel sounds are present normal.  No masses organomegaly Extremities: No edema.  Full range of motion of lower extremities. Neurologic: Alert and oriented x3.  No focal neurological deficits.    Lab Results:  Data Reviewed: I have personally reviewed following labs and imaging studies  CBC: Recent Labs  Lab 05/25/19 0102 05/26/19 0050 05/27/19 0510 05/28/19 0125 05/29/19 0140  WBC 12.1* 8.8 7.9 8.7 10.0  NEUTROABS 10.7*  --   --   --   --   HGB 11.4* 12.6 12.6 11.7* 11.7*  HCT 36.4 40.1 39.6 37.0 37.6  MCV 80.5 79.9* 78.9* 79.4* 80.7  PLT 437* 501* 506* 459* 498*    Basic Metabolic Panel: Recent Labs  Lab 05/24/19 0250 05/26/19 0050 05/27/19 0510 05/28/19 0125 05/29/19 0140  NA 139 140 137 138 137  K 3.7 3.7 3.6 4.0 4.1  CL 109 103 104 105 102  CO2 19* 25 24 22 24   GLUCOSE 204* 133* 128* 166* 177*  BUN 16 29* 31* 28* 23  CREATININE 0.83 0.97 0.91 0.86 0.86  CALCIUM 8.0* 8.4* 8.5* 8.4* 8.8*  MG  --  2.1  --   --   --     GFR: Estimated Creatinine Clearance: 54 mL/min (by C-G formula based on SCr of 0.86 mg/dL).  Liver Function Tests: Recent Labs  Lab 05/24/19 0250 05/26/19 0050 05/27/19 0510 05/28/19 0125 05/29/19 0140  AST 33 31 38 28 28  ALT 36 30 33 29 29  ALKPHOS 54 70 66 59 60  BILITOT 0.4 0.6 1.0 0.5 0.4  PROT 6.8 6.8 6.6 5.8* 5.9*  ALBUMIN 2.9* 3.1* 3.0* 2.9* 3.0*     CBG: Recent Labs  Lab 05/29/19 0816 05/29/19 1231 05/29/19 1654 05/29/19 2030 05/30/19 0818  GLUCAP 119* 188* 231* 219* 116*     Anemia Panel: Recent Labs    05/28/19 0125 05/29/19 0140  FERRITIN 265 262    Recent Results (from the past 240 hour(s))  Blood Culture (routine x 2)     Status: None   Collection Time: 05/23/19  8:51 AM   Specimen: BLOOD  Result Value Ref Range Status   Specimen Description   Final     BLOOD LEFT ANTECUBITAL Performed at [facility], [place] [address] [place], [place] [address]    Special Requests   Final    BOTTLES DRAWN AEROBIC AND ANAEROBIC Blood Culture adequate volume Performed at [facility] [address] [place], [place] [address]    Culture   Final    NO GROWTH 5 DAYS Performed at [facility], [place] [address], [place], [place] [address]    Report Status 05/28/2019 FINAL  Final  Blood Culture (routine x 2)     Status: None   Collection Time: 05/23/19  8:56 AM   Specimen: BLOOD RIGHT FOREARM  Result Value Ref Range Status   Specimen Description   Final    BLOOD RIGHT FOREARM Performed at [facility] [address] [place], [place] [address]    Special Requests   Final    BOTTLES DRAWN AEROBIC AND ANAEROBIC Blood Culture adequate volume Performed at [facility] [address], [place], [place] [address]    Culture   Final    NO GROWTH 5 DAYS Performed at [facility], [place] [address], [place], [place] [address]    Report Status 05/28/2019 FINAL  Final  MRSA PCR Screening     Status: None   Collection Time: 05/23/19  2:55 PM   Specimen: Nasal Mucosa; Nasopharyngeal  Result Value Ref Range Status   MRSA by PCR NEGATIVE NEGATIVE Final    Comment:        The GeneXpert MRSA Assay (FDA approved for NASAL specimens only), is one component of a comprehensive MRSA colonization surveillance program. It is not intended to diagnose MRSA infection nor to guide or monitor treatment for MRSA infections. Performed at [facility], [place] [address] [place], [place] [address]       Radiology Studies: No results found.  LOS: 7 days   Mayan Kloepfer [employer] on [email or web address]  05/30/2019, 11:52 AM

## 2019-05-30 NOTE — Progress Notes (Signed)
Called and spoke with patient's daughter, Olin Hauser.  Updated on mother condition.  All questions addressed.  Earleen Reaper RN

## 2019-05-30 NOTE — Progress Notes (Signed)
Occupational Therapy Treatment Patient Details Name: Diana Roach MRN: [id number] DOB: [birth year] Today's Date: 05/30/2019    History of present illness 68 yo female with a history of asthma and MD who was diagnosed with COVID-19 on July 23 and presented to the ED 7/29 with complaints of worsening shortness of breath.8/1 Code Blue - 4.5 sec pause - atropine - no CPR - transfer to ICU   OT comments  Returned for second visit to assist pt back to bed as she was in recliner and wet (reporting increase in urination due to lasix). Pt performing stand pivot to [facility] with Min A and then performing peri care with Min A. Pt donning new underwear with Min A and then transferring to EOB with Min A. SpO2 >90% on 12L (O2 monitor on finger). Continue to recommend dc to home with HHOT and will continue to follow acutely as admitted.    Follow Up Recommendations  Home health OT;Supervision/Assistance - 24 hour    Equipment Recommendations  Tub/shower seat    Recommendations for Other Services PT consult    Precautions / Restrictions Precautions Precautions: Fall Precaution Comments: monitor sats on HFNC Restrictions Weight Bearing Restrictions: No       Mobility Bed Mobility Overal bed mobility: Needs Assistance Bed Mobility: Sit to Supine Rolling: Min guard Sidelying to sit: Min guard   Sit to supine: Min guard   General bed mobility comments: Increased time  Transfers Overall transfer level: Needs assistance Equipment used: Rolling walker (2 wheeled);None Transfers: Sit to/from [employer] to Stand: Min assist Stand pivot transfers: Min assist       General transfer comment: Min A to power up into standing and then maintain balance to pivot to BSC and then EOB    Balance Overall balance assessment: Needs assistance Sitting-balance support: Bilateral upper extremity supported Sitting balance-Leahy Scale: Fair     Standing balance support: During  functional activity;Bilateral upper extremity supported Standing balance-Leahy Scale: Fair Standing balance comment: relies on UE                           ADL either performed or assessed with clinical judgement   ADL Overall ADL's : Needs assistance/impaired             Upper Body Dressing: Minimal assistance;sitting Donned new gown with Min A.   Lower Body Dressing: Minimal assistance;Sit to/from stand Lower Body Dressing Details (indicate cue type and reason): Min A to assist in donning new underwear. Pt able to then stand and pull over hips with Min A for balance Toilet Transfer: Minimal assistance;Stand-pivot;BSC;RW Toilet Transfer Details (indicate cue type and reason): Pt performing stand pivot to [facility] for urination with Min A and RW.  Toileting- Clothing Manipulation and Hygiene: Minimal assistance;Sit to/from stand;Sitting/lateral lean Toileting - Clothing Manipulation Details (indicate cue type and reason): Min A for cleaning posterior peri area. Pt able to laterally lean and clean front     Functional mobility during ADLs: +2 for safety/equipment;Rolling walker;Minimal assistance(stand pivot only) General ADL Comments: Pt requiring Mod A for standing balance due to fatigue and poor Spo2     Vision Baseline Vision/History: Wears glasses;Cataracts Wears Glasses: At all times Patient Visual Report: No change from baseline     Perception     Praxis      Cognition Arousal/Alertness: Awake/alert Behavior During Therapy: WFL for tasks assessed/performed Overall Cognitive Status: Within Functional Limits for tasks assessed  Exercises     Shoulder Instructions       General Comments SpO2 >90% on 12L    Pertinent Vitals/ Pain       Pain Assessment: Faces Faces Pain Scale: Hurts a little bit Pain Location: Generalized Pain Descriptors / Indicators: Discomfort Pain Intervention(s):  Monitored during session;Limited activity within patient's tolerance;Repositioned  Home Living Family/patient expects to be discharged to:: Private residence Living Arrangements: Children Available Help at Discharge: Family Type of Home: House Home Access: Stairs to enter Technical brewer of Steps: 1   Home Layout: One level     Bathroom Shower/Tub: Corporate investment banker: Standard     Home Equipment: None          Prior Functioning/Environment Level of Independence: Independent        Comments: Pt reporting she is very active   Frequency  Min 3X/week        Progress Toward Goals  OT Goals(current goals can now be found in the care plan section)  Progress towards OT goals: Progressing toward goals  Acute Rehab OT Goals Patient Stated Goal: to go home, do things again OT Goal Formulation: With patient Time For Goal Achievement: 06/13/19 Potential to Achieve Goals: Good ADL Goals Pt Will Perform Grooming: with min guard assist;standing Pt Will Perform Lower Body Dressing: with min guard assist;sit to/from stand;sitting/lateral leans Pt Will Transfer to Toilet: with min guard assist;ambulating;regular height toilet Pt Will Perform Toileting - Clothing Manipulation and hygiene: with supervision;sitting/lateral leans;sit to/from stand Pt/caregiver will Perform Home Exercise Program: Increased strength;Both right and left upper extremity;With theraband;With written HEP provided;Independently Additional ADL Goal #1: Pt will independently verablize three energy conservation strategies for ADLs/IADLs  Plan Discharge plan remains appropriate    Co-evaluation    PT/OT/SLP Co-Evaluation/Treatment: Yes Reason for Co-Treatment: For patient/therapist safety;To address functional/ADL transfers PT goals addressed during session: Mobility/safety with mobility OT goals addressed during session: ADL's and self-care      AM-PAC OT "6 Clicks" Daily  Activity     Outcome Measure   Help from another person eating meals?: None Help from another person taking care of personal grooming?: A Little Help from another person toileting, which includes using toliet, bedpan, or urinal?: A Lot Help from another person bathing (including washing, rinsing, drying)?: A Lot Help from another person to put on and taking off regular upper body clothing?: A Little Help from another person to put on and taking off regular lower body clothing?: A Lot 6 Click Score: 16    End of Session Equipment Utilized During Treatment: Rolling walker;Oxygen(12L)  OT Visit Diagnosis: Unsteadiness on feet (R26.81);Other abnormalities of gait and mobility (R26.89);Muscle weakness (generalized) (M62.81)   Activity Tolerance Patient limited by fatigue   Patient Left with call bell/phone within reach;in bed   Nurse Communication Mobility status        Time: 2671-2458 OT Time Calculation (min): 22 min  Charges: OT General Charges $OT Visit: 1 Visit OT Evaluation $OT Eval Moderate Complexity: 1 Mod OT Treatments $Self Care/Home Management : 8-22 mins  Milee Qualls MSOT, OTR/L Acute Rehab Pager: [phone] Office: [place] 05/30/2019, 4:05 PM

## 2019-05-30 NOTE — Evaluation (Signed)
Physical Therapy Evaluation Patient Details Name: Diana Roach MRN: [id number] DOB: [birth year] Today's Date: 05/30/2019   History of Present Illness  68 yo female with a history of asthma and MD who was diagnosed with COVID-19 on July 23 and presented to the ED 7/29 with complaints of worsening shortness of breath.8/1 Code Blue - 4.5 sec pause - atropine - no CPR - transfer to ICU  Clinical Impression  The patient is on 12 L HFNC, probe on finger, lowest SaO2 87%. Placed probe on ear during mobility with saO2 95%. Placed on 10 L HFNC with SaO2 95% . Patient reports feeling of impaction. Patient assisted to [facility] for a small BM. Pt admitted with above diagnosis. Pt currently with functional limitations due to the deficits listed below (see PT Problem List).  Pt will benefit from skilled PT to increase their independence and safety with mobility to allow discharge to the venue listed below.       Follow Up Recommendations No PT follow up    Equipment Recommendations  None recommended by PT    Recommendations for Other Services       Precautions / Restrictions Precautions Precautions: Fall Precaution Comments: monitor sats on HFNC      Mobility  Bed Mobility Overal bed mobility: Needs Assistance Bed Mobility: Rolling;Sidelying to Sit Rolling: Min guard Sidelying to sit: Min guard       General bed mobility comments: extra time  Transfers Overall transfer level: Needs assistance Equipment used: 2 person hand held assist;Rolling walker (2 wheeled) Transfers: Sit to/from [facility] Sit to Stand: Mod assist Stand pivot transfers: Mod assist       General transfer comment: Steady assist to rise from bed with HHA, pivot steps to BSV, Stood at Rw and took steps to recliner using RW, od assist, + 1 for lines  Ambulation/Gait                Stairs            Wheelchair Mobility    Modified Rankin (Stroke Patients Only)       Balance  Overall balance assessment: Needs assistance Sitting-balance support: Bilateral upper extremity supported Sitting balance-Leahy Scale: Fair     Standing balance support: During functional activity;Bilateral upper extremity supported Standing balance-Leahy Scale: Fair Standing balance comment: relies on UE                             Pertinent Vitals/Pain Pain Assessment: Faces Faces Pain Scale: Hurts little more Pain Location: constipation Pain Descriptors / Indicators: Discomfort Pain Intervention(s): Monitored during session    Home Living Family/patient expects to be discharged to:: Private residence Living Arrangements: Children Available Help at Discharge: Family Type of Home: House Home Access: Stairs to enter   Technical brewer of Steps: 1 Home Layout: One level Home Equipment: None      Prior Function Level of Independence: Independent               Hand Dominance        Extremity/Trunk Assessment        Lower Extremity Assessment Lower Extremity Assessment: Generalized weakness;RLE deficits/detail;LLE deficits/detail RLE Sensation: history of peripheral neuropathy LLE Sensation: history of peripheral neuropathy    Cervical / Trunk Assessment Cervical / Trunk Assessment: Normal  Communication   Communication: No difficulties  Cognition Arousal/Alertness: Awake/alert Behavior During Therapy: WFL for tasks assessed/performed Overall Cognitive Status: Within Functional Limits for  tasks assessed                                        General Comments      Exercises     Assessment/Plan    PT Assessment Patient needs continued PT services  PT Problem List Decreased strength;Decreased mobility;Decreased safety awareness;Decreased knowledge of precautions;Decreased activity tolerance;Cardiopulmonary status limiting activity;Decreased balance;Decreased knowledge of use of DME;Pain       PT Treatment  Interventions DME instruction;Gait training;Functional mobility training;Therapeutic activities;Therapeutic exercise;Patient/family education    PT Goals (Current goals can be found in the Care Plan section)  Acute Rehab PT Goals Patient Stated Goal: to go home, do things again PT Goal Formulation: With patient Time For Goal Achievement: 06/13/19 Potential to Achieve Goals: Good    Frequency Min 3X/week   Barriers to discharge        Co-evaluation PT/OT/SLP Co-Evaluation/Treatment: Yes Reason for Co-Treatment: For patient/therapist safety;Complexity of the patient's impairments (multi-system involvement) PT goals addressed during session: Mobility/safety with mobility OT goals addressed during session: ADL's and self-care       AM-PAC PT "6 Clicks" Mobility  Outcome Measure Help needed turning from your back to your side while in a flat bed without using bedrails?: A Little Help needed moving from lying on your back to sitting on the side of a flat bed without using bedrails?: A Little Help needed moving to and from a bed to a chair (including a wheelchair)?: A Lot Help needed standing up from a chair using your arms (e.g., wheelchair or bedside chair)?: A Lot Help needed to walk in hospital room?: A Lot Help needed climbing 3-5 steps with a railing? : A Lot 6 Click Score: 14    End of Session Equipment Utilized During Treatment: Oxygen Activity Tolerance: Patient tolerated treatment well Patient left: in chair;with call bell/phone within reach Nurse Communication: Mobility status PT Visit Diagnosis: Difficulty in walking, not elsewhere classified (R26.2);Other symptoms and signs involving the nervous system (R29.898);Dizziness and giddiness (R42)    Time: 9528-4132 PT Time Calculation (min) (ACUTE ONLY): 40 min   Charges:   PT Evaluation $PT Eval Moderate Complexity: [place]  Office [phone]   Claretha Cooper 05/30/2019, 12:58 PM

## 2019-05-30 NOTE — Evaluation (Addendum)
Occupational Therapy Evaluation Patient Details Name: Diana Roach MRN: [id number] DOB: 25-Jul-1951 Today's Date: 05/30/2019    History of Present Illness 68 yo female with a history of asthma and MD who was diagnosed with COVID-19 on July 23 and presented to the ED 7/29 with complaints of worsening shortness of breath.8/1 Code Blue - 4.5 sec pause - atropine - no CPR - transfer to ICU   Clinical Impression   PTA, pt was living with her daughter (and son-in-law and grandson) and was independent. Pt currently requiring Min A for UB ADLs, Mod A for LB ADLs, and Mod A +2 for functional transfers. Pt presenting with decreased strength, balance, and activity tolerance as seen by fatigue and poor SpO2. Pt SpO2 dropping to 87% on 12L O2 (via finger probe); switching probe to earlobe and SpO2 >90% on 12L and then 10 L. Pt performing toileting with Min-Mod A having small BM. Pt would benefit from further acute OT to facilitate safe dc. Recommend dc to home with HHOT for further OT to optimize safety, independence with ADLs, and return to PLOF.      Follow Up Recommendations  Home health OT;Supervision/Assistance - 24 hour    Equipment Recommendations  Tub/shower seat    Recommendations for Other Services PT consult     Precautions / Restrictions Precautions Precautions: Fall Precaution Comments: monitor sats on HFNC Restrictions Weight Bearing Restrictions: No      Mobility Bed Mobility Overal bed mobility: Needs Assistance Bed Mobility: Rolling;Sidelying to Sit Rolling: Min guard Sidelying to sit: Min guard       General bed mobility comments: extra time  Transfers Overall transfer level: Needs assistance Equipment used: 2 person hand held assist;Rolling walker (2 wheeled) Transfers: Sit to/from [facility] Sit to Stand: Mod assist Stand pivot transfers: Mod assist       General transfer comment: Steady assist to rise from bed with HHA, pivot steps to BSV,  Stood at Rw and took steps to recliner using RW, od assist, + 1 for lines    Balance Overall balance assessment: Needs assistance Sitting-balance support: Bilateral upper extremity supported Sitting balance-Leahy Scale: Fair     Standing balance support: During functional activity;Bilateral upper extremity supported Standing balance-Leahy Scale: Fair Standing balance comment: relies on UE                           ADL either performed or assessed with clinical judgement   ADL Overall ADL's : Needs assistance/impaired Eating/Feeding: Set up;Supervision/ safety;Sitting   Grooming: Wash/dry hands;Set up;Supervision/safety;Sitting   Upper Body Bathing: Minimal assistance;Sitting   Lower Body Bathing: Moderate assistance;Sit to/from stand   Upper Body Dressing : Minimal assistance;Sitting   Lower Body Dressing: Moderate assistance;Sit to/from stand   Toilet Transfer: Moderate assistance;Stand-pivot;+2 for safety/equipment;RW;BSC   Toileting- Clothing Manipulation and Hygiene: Minimal assistance;Sit to/from stand;+2 for safety/equipment;+2 for physical assistance       Functional mobility during ADLs: Moderate assistance;+2 for safety/equipment;Rolling walker General ADL Comments: Pt requiring Mod A for standing balance due to fatigue and poor Spo2     Vision Baseline Vision/History: Wears glasses;Cataracts Wears Glasses: At all times Patient Visual Report: No change from baseline       Perception     Praxis      Pertinent Vitals/Pain Pain Assessment: Faces Faces Pain Scale: Hurts little more Pain Location: constipation Pain Descriptors / Indicators: Discomfort Pain Intervention(s): Monitored during session;Limited activity within patient's tolerance;Repositioned  Hand Dominance Right   Extremity/Trunk Assessment Upper Extremity Assessment Upper Extremity Assessment: Generalized weakness   Lower Extremity Assessment Lower Extremity Assessment:  Defer to PT evaluation RLE Sensation: history of peripheral neuropathy LLE Sensation: history of peripheral neuropathy   Cervical / Trunk Assessment Cervical / Trunk Assessment: Normal   Communication Communication Communication: No difficulties   Cognition Arousal/Alertness: Awake/alert Behavior During Therapy: WFL for tasks assessed/performed Overall Cognitive Status: Within Functional Limits for tasks assessed                                     General Comments  SpO2 dropping to 85% on 12L while standing. Switched O2 monitor to earlobe and reading 96% on 12L. BP after toileting 97/62    Exercises     Shoulder Instructions      Home Living Family/patient expects to be discharged to:: Private residence Living Arrangements: Children Available Help at Discharge: Family Type of Home: House Home Access: Stairs to enter Technical brewer of Steps: 1   Home Layout: One level     Bathroom Shower/Tub: Corporate investment banker: Standard     Home Equipment: None          Prior Functioning/Environment Level of Independence: Independent        Comments: Pt reporting she is very active        OT Problem List: Decreased strength;Decreased range of motion;Decreased activity tolerance;Impaired balance (sitting and/or standing);Decreased knowledge of use of DME or AE;Decreased knowledge of precautions;Cardiopulmonary status limiting activity      OT Treatment/Interventions: Self-care/ADL training;Therapeutic exercise;Energy conservation;DME and/or AE instruction;Therapeutic activities;Patient/family education    OT Goals(Current goals can be found in the care plan section) Acute Rehab OT Goals Patient Stated Goal: to go home, do things again OT Goal Formulation: With patient Time For Goal Achievement: 06/13/19 Potential to Achieve Goals: Good  OT Frequency: Min 3X/week   Barriers to D/C:            Co-evaluation PT/OT/SLP  Co-Evaluation/Treatment: Yes Reason for Co-Treatment: For patient/therapist safety;To address functional/ADL transfers PT goals addressed during session: Mobility/safety with mobility OT goals addressed during session: ADL's and self-care      AM-PAC OT "6 Clicks" Daily Activity     Outcome Measure Help from another person eating meals?: None Help from another person taking care of personal grooming?: A Little Help from another person toileting, which includes using toliet, bedpan, or urinal?: A Lot Help from another person bathing (including washing, rinsing, drying)?: A Lot Help from another person to put on and taking off regular upper body clothing?: A Little Help from another person to put on and taking off regular lower body clothing?: A Lot 6 Click Score: 16   End of Session Equipment Utilized During Treatment: Rolling walker;Oxygen(10-12L) Nurse Communication: Mobility status  Activity Tolerance: Patient limited by fatigue Patient left: in chair;with call bell/phone within reach  OT Visit Diagnosis: Unsteadiness on feet (R26.81);Other abnormalities of gait and mobility (R26.89);Muscle weakness (generalized) (M62.81)                Time: 6387-5643 OT Time Calculation (min): 40 min Charges:  OT General Charges $OT Visit: 1 Visit OT Evaluation $OT Eval Moderate Complexity: 1 Mod OT Treatments $Self Care/Home Management : 8-22 mins  Franny Selvage MSOT, OTR/L Acute Rehab Pager: [phone] Office: [place] 05/30/2019, 3:53 PM

## 2019-05-30 NOTE — Plan of Care (Signed)
Will continue with plan of care. 

## 2019-05-31 ENCOUNTER — Inpatient Hospital Stay (HOSPITAL_COMMUNITY): Payer: PPO

## 2019-05-31 LAB — COMPREHENSIVE METABOLIC PANEL
ALT: 32 U/L (ref 0–44)
AST: 24 U/L (ref 15–41)
Albumin: 3.3 g/dL — ABNORMAL LOW (ref 3.5–5.0)
Alkaline Phosphatase: 60 U/L (ref 38–126)
Anion gap: 10 (ref 5–15)
BUN: 28 mg/dL — ABNORMAL HIGH (ref 8–23)
CO2: 28 mmol/L (ref 22–32)
Calcium: 9.1 mg/dL (ref 8.9–10.3)
Chloride: 95 mmol/L — ABNORMAL LOW (ref 98–111)
Creatinine, Ser: 0.9 mg/dL (ref 0.44–1.00)
GFR calc Af Amer: >60 mL/min (ref >60)
GFR calc non Af Amer: >60 mL/min (ref >60)
Glucose, Bld: 175 mg/dL — ABNORMAL HIGH (ref 70–99)
Potassium: 4.5 mmol/L (ref 3.5–5.1)
Sodium: 133 mmol/L — ABNORMAL LOW (ref 135–145)
Total Bilirubin: 1 mg/dL (ref 0.3–1.2)
Total Protein: 6.8 g/dL (ref 6.5–8.1)

## 2019-05-31 LAB — CBC
HCT: 40 % (ref 36.0–46.0)
Hemoglobin: 12.5 g/dL (ref 12.0–15.0)
MCH: 24.9 pg — ABNORMAL LOW (ref 26.0–34.0)
MCHC: 31.3 g/dL (ref 30.0–36.0)
MCV: 79.7 fL — ABNORMAL LOW (ref 80.0–100.0)
Platelets: 537 10*3/uL — ABNORMAL HIGH (ref 150–400)
RBC: 5.02 MIL/uL (ref 3.87–5.11)
RDW: 16.2 % — ABNORMAL HIGH (ref 11.5–15.5)
WBC: 15.6 10*3/uL — ABNORMAL HIGH (ref 4.0–10.5)
nRBC: 0 % (ref 0.0–0.2)

## 2019-05-31 LAB — GLUCOSE, CAPILLARY
Glucose-Capillary: 166 mg/dL — ABNORMAL HIGH (ref 70–99)
Glucose-Capillary: 213 mg/dL — ABNORMAL HIGH (ref 70–99)
Glucose-Capillary: 217 mg/dL — ABNORMAL HIGH (ref 70–99)
Glucose-Capillary: 233 mg/dL — ABNORMAL HIGH (ref 70–99)

## 2019-05-31 MED ORDER — DEXAMETHASONE 4 MG PO TABS
4.0000 mg | ORAL_TABLET | Freq: Every day | ORAL | Status: DC
  Administered 2019-06-01 – 2019-06-03 (×3): 4 mg via ORAL
  Filled 2019-05-31 (×3): qty 1

## 2019-05-31 MED ORDER — FUROSEMIDE 10 MG/ML IJ SOLN
40.0000 mg | Freq: Once | INTRAMUSCULAR | Status: AC
  Administered 2019-05-31: 12:00:00 40 mg via INTRAVENOUS
  Filled 2019-05-31: qty 4

## 2019-05-31 MED ORDER — DEXAMETHASONE 6 MG PO TABS
6.0000 mg | ORAL_TABLET | Freq: Every day | ORAL | Status: DC
  Administered 2019-05-31: 6 mg via ORAL
  Filled 2019-05-31: qty 1

## 2019-05-31 NOTE — Progress Notes (Signed)
Pt did well with bath this morning other than an episode of coughing. Pt is now resting comfortably drinking a chocolate ensure.

## 2019-05-31 NOTE — Progress Notes (Signed)
PROGRESS NOTE  IMO CUMBIE FKC:[id number] DOB: [birth year] DOA: 05/23/2019  PCP: Leeanne Rio, MD  Brief History/Interval Summary: 68 year old with a history of asthma and DM who was diagnosed with COVID-19 on July 23 and presented to the ED 7/29 with complaints of worsening shortness of breath.  She was found to have a temperature of 102 and was saturating 80% on room air.  A chest x-ray noted hazy bibasilar opacities.  She originally presented to the ED on July 25th with shortness of breath but at that time was stable enough to be discharged home on prednisone.  Presented back on 7/29 and was hospitalized.  Had a CODE BLUE arrest on 8/1 with a four-point second pause.  Was given atropine and underwent CPR was transferred to the ICU.  Subsequently transferred back to PCU.  CT angiogram was negative for PE.  Reason for Visit: Acute respiratory disease due to COVID-19  Consultants: None  Procedures: None  Antibiotics: Anti-infectives (From admission, onward)   Start     Dose/Rate Route Frequency Ordered Stop   05/27/19 1800  remdesivir 100 mg in sodium chloride 0.9 % 250 mL IVPB     100 mg 500 mL/hr over 30 Minutes Intravenous Every 24 hours 05/26/19 1826 05/27/19 1821   05/25/19 1000  azithromycin (ZITHROMAX) tablet 500 mg  Status:  Discontinued     500 mg Oral Daily 05/24/19 0955 05/24/19 1218   05/24/19 1400  remdesivir 100 mg in sodium chloride 0.9 % 250 mL IVPB     100 mg 500 mL/hr over 30 Minutes Intravenous Every 24 hours 05/23/19 1224 05/26/19 1900   05/24/19 1300  cefTRIAXone (ROCEPHIN) 1 g in sodium chloride 0.9 % 100 mL IVPB  Status:  Discontinued     1 g 200 mL/hr over 30 Minutes Intravenous Every 24 hours 05/24/19 0955 05/24/19 1218   05/23/19 1400  remdesivir 200 mg in sodium chloride 0.9 % 250 mL IVPB     200 mg 500 mL/hr over 30 Minutes Intravenous Once 05/23/19 1224 05/23/19 1658   05/23/19 1230  cefTRIAXone (ROCEPHIN) 1 g in sodium chloride 0.9 % 100 mL  IVPB  Status:  Discontinued     1 g 200 mL/hr over 30 Minutes Intravenous Every 24 hours 05/23/19 1207 05/24/19 0953   05/23/19 1230  azithromycin (ZITHROMAX) 500 mg in sodium chloride 0.9 % 250 mL IVPB  Status:  Discontinued     500 mg 250 mL/hr over 60 Minutes Intravenous Every 24 hours 05/23/19 1207 05/24/19 0953       Subjective/Interval History: Patient states that she is feeling better.  She slept well overnight.  Denies any nausea vomiting.  Still has some shortness of breath with exertion.  Occasional cough.  No further nasal bleeding.      Assessment/Plan:  Acute Hypoxic Resp. Failure due to Acute Covid 19 Viral Illness  COVID-19 Labs  Recent Labs    05/29/19 0140  DDIMER 2.82*  FERRITIN 262  CRP 1.1*    Lab Results  Component Value Date   SARSCOV2NAA Detected (A) 05/15/2019     Fever: Remains afebrile Oxygen requirements: HF [place].  Down to 5 L.  Saturating in the 90s. Antibacterials: No longer on antibiotics Remdesivir: Completed course Steroids: Remains on dexamethasone Diuretics: Given a dose of Lasix yesterday.  Will give additional dose today. Actemra: 1 dose given on 7/29 DVT Prophylaxis:  Lovenox 40 mg once a day  Research Studies: Not enrolled into research studies  Patient's respiratory  status has improved over the last 24 hours.  Diuretics might have helped.  Give additional dose today.  Patient has completed course of Remdesivir.  She remains on steroids which will be tapered down gradually.  CT angiogram done recently did not show any PE.  Chest x-ray shows improvement in bilateral opacities.  Awake prone positioning as much as possible.  Incentive spirometry.  Mobilization.  Inflammatory markers have improved.  Leukocytosis is most likely due to steroids.  Cardiac pause/bradycardia Patient apparently had cardiac arrest.  She had a greater than 4-second pause.  She underwent CPR and was given atropine.  Transferred to the ICU.  Stabilized.  Did not  have any recurrence.  She was transferred back to the floor.  Remains on telemetry.  TSH normal.  Echocardiogram done recently in May showed normal systolic function.  Continue to monitor closely.  Remains stable.  Hyponatremia Sodium level noted to be low this morning.  Possibly due to diuretics.  Monitor closely.  Diabetes mellitus type 2 HbA1c 7.4.  CBGs not optimal but reasonably controlled.  Should improve as steroid is tapered down.  History of asthma Stable.  DVT Prophylaxis: Lovenox PUD Prophylaxis: On Protonix Code Status: Full code Family Communication: Discussed with the patient.  Discussed with the daughter yesterday.  We will call again today. Disposition Plan: Management as outlined above.  Patient remains on high doses of oxygen.  Not quite ready for discharge.   Medications:  Scheduled: . dexamethasone  6 mg Oral Daily  . enoxaparin (LOVENOX) injection  40 mg Subcutaneous Q24H  . feeding supplement (ENSURE ENLIVE)  237 mL Oral BID BM  . fluticasone  2 spray Each Nare Daily  . fluticasone  1 puff Inhalation BID  . insulin aspart  0-15 Units Subcutaneous TID WC  . loratadine  10 mg Oral Daily  . mouth rinse  15 mL Mouth Rinse BID  . pantoprazole  40 mg Oral Daily  . polyethylene glycol  17 g Oral BID  . senna-docusate  1 tablet Oral BID  . sodium chloride flush  3 mL Intravenous Q12H   Continuous: . sodium chloride Stopped (05/27/19 1932)   FXT:KWIOXB chloride, acetaminophen, albuterol, benzonatate, bisacodyl, guaiFENesin-dextromethorphan, hydrALAZINE, ondansetron **OR** ondansetron (ZOFRAN) IV, sodium chloride flush, sodium phosphate   Objective:  Vital Signs  Vitals:   05/31/19 0541 05/31/19 0744 05/31/19 0800 05/31/19 0808  BP: 108/77     Pulse: (!) 58  (!) 59 60  Resp: 16  12 13   Temp: 97.7 F (36.5 C) 98.2 F (36.8 C)    TempSrc: Oral Oral    SpO2: 99%  99% 96%  Weight:      Height:        Intake/Output Summary (Last 24 hours) at 05/31/2019  1010 Last data filed at 05/31/2019 0600 Gross per 24 hour  Intake 960 ml  Output 1651 ml  Net -691 ml   Filed Weights   05/23/19 1442  Weight: 64.9 kg    General appearance: Awake alert.  In no distress Resp: Not as tachypneic as yesterday.  Coarse breath sounds bilaterally.  Improved crackles but still present at the bases.  No wheezing or rhonchi.   Cardio: S1-S2 is normal regular.  No S3-S4.  No rubs murmurs or bruit GI: Abdomen is soft.  Nontender nondistended.  Bowel sounds are present normal.  No masses organomegaly Extremities: Minimal edema.  Full range of motion of lower extremities. Neurologic: Alert and oriented x3.  No focal neurological deficits.  Lab Results:  Data Reviewed: I have personally reviewed following labs and imaging studies  CBC: Recent Labs  Lab 05/25/19 0102 05/26/19 0050 05/27/19 0510 05/28/19 0125 05/29/19 0140 05/31/19 0345  WBC 12.1* 8.8 7.9 8.7 10.0 15.6*  NEUTROABS 10.7*  --   --   --   --   --   HGB 11.4* 12.6 12.6 11.7* 11.7* 12.5  HCT 36.4 40.1 39.6 37.0 37.6 40.0  MCV 80.5 79.9* 78.9* 79.4* 80.7 79.7*  PLT 437* 501* 506* 459* 498* 537*    Basic Metabolic Panel: Recent Labs  Lab 05/26/19 0050 05/27/19 0510 05/28/19 0125 05/29/19 0140 05/31/19 0345  NA 140 137 138 137 133*  K 3.7 3.6 4.0 4.1 4.5  CL 103 104 105 102 95*  CO2 25 24 22 24 28   GLUCOSE 133* 128* 166* 177* 175*  BUN 29* 31* 28* 23 28*  CREATININE 0.97 0.91 0.86 0.86 0.90  CALCIUM 8.4* 8.5* 8.4* 8.8* 9.1  MG 2.1  --   --   --   --     GFR: Estimated Creatinine Clearance: 51.6 mL/min (by C-G formula based on SCr of 0.9 mg/dL).  Liver Function Tests: Recent Labs  Lab 05/26/19 0050 05/27/19 0510 05/28/19 0125 05/29/19 0140 05/31/19 0345  AST 31 38 28 28 24   ALT 30 33 29 29 32  ALKPHOS 70 66 59 60 60  BILITOT 0.6 1.0 0.5 0.4 1.0  PROT 6.8 6.6 5.8* 5.9* 6.8  ALBUMIN 3.1* 3.0* 2.9* 3.0* 3.3*     CBG: Recent Labs  Lab 05/30/19 0818 05/30/19  1200 05/30/19 1719 05/30/19 2049 05/31/19 0738  GLUCAP 116* 218* 236* 210* 166*     Anemia Panel: Recent Labs    05/29/19 0140  FERRITIN 262    Recent Results (from the past 240 hour(s))  Blood Culture (routine x 2)     Status: None   Collection Time: 05/23/19  8:51 AM   Specimen: BLOOD  Result Value Ref Range Status   Specimen Description   Final    BLOOD LEFT ANTECUBITAL Performed at [facility], [place] [address], [place], [place] [address]    Special Requests   Final    BOTTLES DRAWN AEROBIC AND ANAEROBIC Blood Culture adequate volume Performed at [facility] [address], [place], [place] [address]    Culture   Final    NO GROWTH 5 DAYS Performed at [facility], [place] [address] [place], [place] [address]    Report Status 05/28/2019 FINAL  Final  Blood Culture (routine x 2)     Status: None   Collection Time: 05/23/19  8:56 AM   Specimen: BLOOD RIGHT FOREARM  Result Value Ref Range Status   Specimen Description   Final    BLOOD RIGHT FOREARM Performed at [facility] [address], [place], [place] [address]    Special Requests   Final    BOTTLES DRAWN AEROBIC AND ANAEROBIC Blood Culture adequate volume Performed at [facility] [address] [place], [place] [address]    Culture   Final    NO GROWTH 5 DAYS Performed at [facility], [place] [address] [place], [place] [address]    Report Status 05/28/2019 FINAL  Final  MRSA PCR Screening     Status: None   Collection Time: 05/23/19  2:55 PM   Specimen: Nasal Mucosa; Nasopharyngeal  Result Value Ref Range Status   MRSA by PCR NEGATIVE  NEGATIVE Final    Comment:        The GeneXpert MRSA Assay (FDA approved for NASAL specimens only), is one component of a comprehensive MRSA colonization surveillance program. It is not intended to diagnose MRSA infection nor to guide or monitor treatment for MRSA infections. Performed at  [facility], [place] [address], [place], [place] [address]       Radiology Studies: Dg Chest Port 1 View  Result Date: 05/31/2019 CLINICAL DATA:  COVID-19 positive.  Shortness of breath. EXAM: PORTABLE CHEST 1 VIEW COMPARISON:  May 25, 2019 FINDINGS: The heart, hila, and mediastinum are unremarkable. No pneumothorax. Decreased pulmonary opacities. Minimal opacity remains in the lateral right upper lobe and the right base. More prominent opacity in the left base is similar with a somewhat streaky appearance. No other acute abnormalities. IMPRESSION: 1. Improving pulmonary infiltrates. Streaky opacity remains in the left base consistent with persistent infiltrate as it was not present on the May 19, 2019 study. No other acute abnormalities. Electronically Signed   By: Dorise Bullion III M.D   On: 05/31/2019 08:55       LOS: 8 days   [facility] Pager on [email or web address]  05/31/2019, 10:10 AM

## 2019-06-01 DIAGNOSIS — E871 Hypo-osmolality and hyponatremia: Secondary | ICD-10-CM

## 2019-06-01 LAB — COMPREHENSIVE METABOLIC PANEL
ALT: 30 U/L (ref 0–44)
AST: 20 U/L (ref 15–41)
Albumin: 3.5 g/dL (ref 3.5–5.0)
Alkaline Phosphatase: 59 U/L (ref 38–126)
Anion gap: 12 (ref 5–15)
BUN: 32 mg/dL — ABNORMAL HIGH (ref 8–23)
CO2: 29 mmol/L (ref 22–32)
Calcium: 8.9 mg/dL (ref 8.9–10.3)
Chloride: 92 mmol/L — ABNORMAL LOW (ref 98–111)
Creatinine, Ser: 0.95 mg/dL (ref 0.44–1.00)
GFR calc Af Amer: >60 mL/min (ref >60)
GFR calc non Af Amer: >60 mL/min (ref >60)
Glucose, Bld: 166 mg/dL — ABNORMAL HIGH (ref 70–99)
Potassium: 3.8 mmol/L (ref 3.5–5.1)
Sodium: 133 mmol/L — ABNORMAL LOW (ref 135–145)
Total Bilirubin: 1 mg/dL (ref 0.3–1.2)
Total Protein: 6.8 g/dL (ref 6.5–8.1)

## 2019-06-01 LAB — CBC
HCT: 39.4 % (ref 36.0–46.0)
Hemoglobin: 12.5 g/dL (ref 12.0–15.0)
MCH: 25.1 pg — ABNORMAL LOW (ref 26.0–34.0)
MCHC: 31.7 g/dL (ref 30.0–36.0)
MCV: 79 fL — ABNORMAL LOW (ref 80.0–100.0)
Platelets: 529 10*3/uL — ABNORMAL HIGH (ref 150–400)
RBC: 4.99 MIL/uL (ref 3.87–5.11)
RDW: 16.2 % — ABNORMAL HIGH (ref 11.5–15.5)
WBC: 16.9 10*3/uL — ABNORMAL HIGH (ref 4.0–10.5)
nRBC: 0 % (ref 0.0–0.2)

## 2019-06-01 LAB — GLUCOSE, CAPILLARY
Glucose-Capillary: 123 mg/dL — ABNORMAL HIGH (ref 70–99)
Glucose-Capillary: 144 mg/dL — ABNORMAL HIGH (ref 70–99)
Glucose-Capillary: 235 mg/dL — ABNORMAL HIGH (ref 70–99)
Glucose-Capillary: 204 mg/dL — ABNORMAL HIGH (ref 70–99)

## 2019-06-01 MED ORDER — GUAIFENESIN ER 600 MG PO TB12
600.0000 mg | ORAL_TABLET | Freq: Two times a day (BID) | ORAL | Status: DC
  Administered 2019-06-01 – 2019-06-04 (×7): 600 mg via ORAL
  Filled 2019-06-01 (×7): qty 1

## 2019-06-01 NOTE — Progress Notes (Signed)
Physical Therapy Treatment Patient Details Name: Diana Roach MRN: [id number] DOB: 02-Dec-1950 Today's Date: 06/01/2019    History of Present Illness 68 yo female with a history of asthma and MD who was diagnosed with COVID-19 on July 23 and presented to the ED 7/29 with complaints of worsening shortness of breath.8/1 Code Blue - 4.5 sec pause - atropine - no CPR - transfer to ICU    PT Comments    The patient is motivated to begin ambulation. Patient complained of dizziness after 10'. Sat down with BP 82/61. Able to ambulate another 81' with less dizziness. BP after ambulation and in recliner w/ feet up 101/71. Conmtinue PT mobility.  Follow Up Recommendations  No PT follow up     Equipment Recommendations  Rolling walker with 5" wheels;3in1 (PT)    Recommendations for Other Services       Precautions / Restrictions Precautions Precautions: Fall Precaution Comments: monitor sats on HFNC, hypotensive- check orthostatics Restrictions Weight Bearing Restrictions: No    Mobility  Bed Mobility Overal bed mobility: Needs Assistance Bed Mobility: Supine to Sit Rolling: Min guard   Supine to sit: Min guard     General bed mobility comments: for safety, lines  Transfers Overall transfer level: Needs assistance Equipment used: Rolling walker (2 wheeled);None Transfers: Sit to/from Stand Sit to Stand: Min assist;+2 safety/equipment         General transfer comment: Min A to power up into standing and then maintain balance   Ambulation/Gait Ambulation/Gait assistance: Min assist;+2 safety/equipment Gait Distance (Feet): 15 Feet(then 20) Assistive device: Rolling walker (2 wheeled) Gait Pattern/deviations: Step-through pattern Gait velocity: decr   General Gait Details: gait slow, pt reports dizziness. BP 82/61 after sitting on BSC as pt needed to stop due to dizziness. Able to ambulate again x 20'   Stairs             Wheelchair Mobility    Modified  Rank[place] (Stroke Patients Only)       Balance Overall balance assessment: Needs assistance Sitting-balance support: Bilateral upper extremity supported Sitting balance-Leahy Scale: Fair     Standing balance support: During functional activity;Bilateral upper extremity supported Standing balance-Leahy Scale: Fair Standing balance comment: relies on UE                            Cognition Arousal/Alertness: Awake/alert Behavior During Therapy: WFL for tasks assessed/performed Overall Cognitive Status: Within Functional Limits for tasks assessed                                        Exercises      General Comments        Pertinent Vitals/Pain Pain Assessment: Faces Faces Pain Scale: Hurts a little bit Pain Location: Generalized Pain Descriptors / Indicators: Discomfort Pain Intervention(s): Monitored during session    Home Living                      Prior Function            PT Goals (current goals can now be found in the care plan section) Acute Rehab PT Goals Patient Stated Goal: to go home, do things again Progress towards PT goals: Progressing toward goals    Frequency    Min 3X/week      PT Plan Current plan remains appropriate  Co-evaluation PT/OT/SLP Co-Evaluation/Treatment: Yes Reason for Co-Treatment: For patient/therapist safety PT goals addressed during session: Mobility/safety with mobility OT goals addressed during session: ADL's and self-care      AM-PAC PT "6 Clicks" Mobility   Outcome Measure  Help needed turning from your back to your side while in a flat bed without using bedrails?: A Little Help needed moving from lying on your back to sitting on the side of a flat bed without using bedrails?: A Little Help needed moving to and from a bed to a chair (including a wheelchair)?: A Lot Help needed standing up from a chair using your arms (e.g., wheelchair or bedside chair)?: A Lot Help needed to  walk in hospital room?: A Lot Help needed climbing 3-5 steps with a railing? : A Lot 6 Click Score: 14    End of Session Equipment Utilized During Treatment: Oxygen Activity Tolerance: Treatment limited secondary to medical complications (Comment)(hypotn) Patient left: in chair;with call bell/phone within reach Nurse Communication: Mobility status PT Visit Diagnosis: Difficulty in walking, not elsewhere classified (R26.2);Other symptoms and signs involving the nervous system (R29.898);Dizziness and giddiness (R42)     Time: 9643-8381 PT Time Calculation (min) (ACUTE ONLY): 23 min  Charges:  $Gait Training: 8-22 mins                     [place]  Office [phone]    Claretha Cooper 06/01/2019, 2:06 PM

## 2019-06-01 NOTE — Progress Notes (Signed)
Called and updated patient's daughter, Olin Hauser, on patient's progress.  All questions addressed.  Earleen Reaper RN

## 2019-06-01 NOTE — Progress Notes (Signed)
PROGRESS NOTE  Diana Roach:[id number] DOB: 1951-04-22 DOA: 05/23/2019  PCP: Leeanne Rio, MD  Brief History/Interval Summary: 68 year old with a history of asthma and DM who was diagnosed with COVID-19 on July 23 and presented to the ED 7/29 with complaints of worsening shortness of breath.  She was found to have a temperature of 102 and was saturating 80% on room air.  A chest x-ray noted hazy bibasilar opacities.  She originally presented to the ED on July 25th with shortness of breath but at that time was stable enough to be discharged home on prednisone.  Presented back on 7/29 and was hospitalized.  Had a CODE BLUE arrest on 8/1 with a four-point second pause.  Was given atropine and underwent CPR was transferred to the ICU.  Subsequently transferred back to PCU.  CT angiogram was negative for PE.  Reason for Visit: Acute respiratory disease due to COVID-19  Consultants: None  Procedures: None  Antibiotics: Anti-infectives (From admission, onward)   Start     Dose/Rate Route Frequency Ordered Stop   05/27/19 1800  remdesivir 100 mg in sodium chloride 0.9 % 250 mL IVPB     100 mg 500 mL/hr over 30 Minutes Intravenous Every 24 hours 05/26/19 1826 05/27/19 1821   05/25/19 1000  azithromycin (ZITHROMAX) tablet 500 mg  Status:  Discontinued     500 mg Oral Daily 05/24/19 0955 05/24/19 1218   05/24/19 1400  remdesivir 100 mg in sodium chloride 0.9 % 250 mL IVPB     100 mg 500 mL/hr over 30 Minutes Intravenous Every 24 hours 05/23/19 1224 05/26/19 1900   05/24/19 1300  cefTRIAXone (ROCEPHIN) 1 g in sodium chloride 0.9 % 100 mL IVPB  Status:  Discontinued     1 g 200 mL/hr over 30 Minutes Intravenous Every 24 hours 05/24/19 0955 05/24/19 1218   05/23/19 1400  remdesivir 200 mg in sodium chloride 0.9 % 250 mL IVPB     200 mg 500 mL/hr over 30 Minutes Intravenous Once 05/23/19 1224 05/23/19 1658   05/23/19 1230  cefTRIAXone (ROCEPHIN) 1 g in sodium chloride 0.9 % 100 mL  IVPB  Status:  Discontinued     1 g 200 mL/hr over 30 Minutes Intravenous Every 24 hours 05/23/19 1207 05/24/19 0953   05/23/19 1230  azithromycin (ZITHROMAX) 500 mg in sodium chloride 0.9 % 250 mL IVPB  Status:  Discontinued     500 mg 250 mL/hr over 60 Minutes Intravenous Every 24 hours 05/23/19 1207 05/24/19 0953       Subjective/Interval History: Patient states that she is continuing to feel better.  He is having a cough with whitish expectoration.  Denies any blood in the sputum.  No nausea vomiting.  Shortness of breath has improved.       Assessment/Plan:  Acute Hypoxic Resp. Failure due to Acute Covid 19 Viral Illness  Fever: Remains afebrile Oxygen requirements: Now on nasal cannula.  3 L/min.  Saturating in the 90s.   Antibacterials: No longer on antibiotics Remdesivir: Completed course Steroids: Remains on dexamethasone.  Will start tapering. Diuretics: Given Lasix the last 2 days.  We will hold off today. Actemra: 1 dose given on 7/29 DVT Prophylaxis:  Lovenox 40 mg once a day  Research Studies: Not enrolled into research studies  Patient's respiratory status has been improving over the last 48 hours.  Diuretics are probably help.  We will hold off on diuretics today.  She is now on nasal cannula at  3 L/min.  Continue to monitor closely.  Awake pruning as much as possible.  Incentive spirometry and mobilization.  Inflammatory markers had improved.  Leukocytosis is due to steroids.  PT and OT to continue to evaluate.  Cardiac pause/bradycardia Patient apparently had cardiac arrest.  She had a greater than 4-second pause.  She underwent CPR and was given atropine.  Transferred to the ICU.  Stabilized.  Did not have any recurrence.  She was transferred back to the floor.  Remains on telemetry.  TSH normal.  Echocardiogram done recently in May showed normal systolic function.  She has been stable from a cardiac standpoint.  She is noted to have sinus bradycardia when she is  asleep.  Hyponatremia Most likely due to diuretics.  Stable this morning.  Diabetes mellitus type 2 HbA1c 7.4.  CBGs not optimal but reasonably controlled.  Should improve as steroid is tapered down.  History of asthma Stable.  DVT Prophylaxis: Lovenox PUD Prophylaxis: On Protonix Code Status: Full code Family Communication: Discussed with patient.  Daughter being updated on a daily basis.   Disposition Plan: Assess for home oxygen.  Possible discharge in 2 to 3 days.     Medications:  Scheduled: . dexamethasone  4 mg Oral Daily  . enoxaparin (LOVENOX) injection  40 mg Subcutaneous Q24H  . feeding supplement (ENSURE ENLIVE)  237 mL Oral BID BM  . fluticasone  2 spray Each Nare Daily  . fluticasone  1 puff Inhalation BID  . guaiFENesin  600 mg Oral BID  . insulin aspart  0-15 Units Subcutaneous TID WC  . loratadine  10 mg Oral Daily  . mouth rinse  15 mL Mouth Rinse BID  . pantoprazole  40 mg Oral Daily  . polyethylene glycol  17 g Oral BID  . senna-docusate  1 tablet Oral BID  . sodium chloride flush  3 mL Intravenous Q12H   Continuous: . sodium chloride Stopped (05/27/19 1932)   SKA:JGOTLX chloride, acetaminophen, albuterol, benzonatate, bisacodyl, guaiFENesin-dextromethorphan, hydrALAZINE, ondansetron **OR** ondansetron (ZOFRAN) IV, sodium chloride flush, sodium phosphate   Objective:  Vital Signs  Vitals:   05/31/19 2300 06/01/19 0457 06/01/19 0500 06/01/19 0734  BP:  104/70 104/70 105/65  Pulse: 65  63 (!) 59  Resp: 14  11 10   Temp:  98 F (36.7 C)  97.7 F (36.5 C)  TempSrc:  Oral  Oral  SpO2: 99%  94% 96%  Weight:      Height:        Intake/Output Summary (Last 24 hours) at 06/01/2019 1014 Last data filed at 05/31/2019 1618 Gross per 24 hour  Intake 600 ml  Output -  Net 600 ml   Filed Weights   05/23/19 1442  Weight: 64.9 kg     General appearance: Awake alert.  In no distress Resp: Mildly tachypneic at rest but not as much as before.  Coarse  breath sounds bilaterally with few crackles at the bases.  No wheezing or rhonchi.   Cardio: S1-S2 is normal regular.  No S3-S4.  No rubs murmurs or bruit GI: Abdomen is soft.  Nontender nondistended.  Bowel sounds are present normal.  No masses organomegaly Extremities: No edema.  Full range of motion of lower extremities. Neurologic: Alert and oriented x3.  No focal neurological deficits.     Lab Results:  Data Reviewed: I have personally reviewed following labs and imaging studies  CBC: Recent Labs  Lab 05/27/19 0510 05/28/19 0125 05/29/19 0140 05/31/19 0345 06/01/19 0130  WBC 7.9 8.7 10.0 15.6* 16.9*  HGB 12.6 11.7* 11.7* 12.5 12.5  HCT 39.6 37.0 37.6 40.0 39.4  MCV 78.9* 79.4* 80.7 79.7* 79.0*  PLT 506* 459* 498* 537* 529*    Basic Metabolic Panel: Recent Labs  Lab 05/26/19 0050 05/27/19 0510 05/28/19 0125 05/29/19 0140 05/31/19 0345 06/01/19 0130  NA 140 137 138 137 133* 133*  K 3.7 3.6 4.0 4.1 4.5 3.8  CL 103 104 105 102 95* 92*  CO2 25 24 22 24 28 29   GLUCOSE 133* 128* 166* 177* 175* 166*  BUN 29* 31* 28* 23 28* 32*  CREATININE 0.97 0.91 0.86 0.86 0.90 0.95  CALCIUM 8.4* 8.5* 8.4* 8.8* 9.1 8.9  MG 2.1  --   --   --   --   --     GFR: Estimated Creatinine Clearance: 48.9 mL/min (by C-G formula based on SCr of 0.95 mg/dL).  Liver Function Tests: Recent Labs  Lab 05/27/19 0510 05/28/19 0125 05/29/19 0140 05/31/19 0345 06/01/19 0130  AST 38 28 28 24 20   ALT 33 29 29 32 30  ALKPHOS 66 59 60 60 59  BILITOT 1.0 0.5 0.4 1.0 1.0  PROT 6.6 5.8* 5.9* 6.8 6.8  ALBUMIN 3.0* 2.9* 3.0* 3.3* 3.5     CBG: Recent Labs  Lab 05/31/19 0738 05/31/19 1134 05/31/19 1633 05/31/19 2032 06/01/19 0732  GLUCAP 166* 213* 217* 233* 123*      Recent Results (from the past 240 hour(s))  Blood Culture (routine x 2)     Status: None   Collection Time: 05/23/19  8:51 AM   Specimen: BLOOD  Result Value Ref Range Status   Specimen Description   Final    BLOOD  LEFT ANTECUBITAL Performed at [facility], [place] [address] [place], [place] [address]    Special Requests   Final    BOTTLES DRAWN AEROBIC AND ANAEROBIC Blood Culture adequate volume Performed at [facility] [place] [address], [place], [place] [address]    Culture   Final    NO GROWTH 5 DAYS Performed at [facility], [place] [address] [place], [place] [address]    Report Status 05/28/2019 FINAL  Final  Blood Culture (routine x 2)     Status: None   Collection Time: 05/23/19  8:56 AM   Specimen: BLOOD RIGHT FOREARM  Result Value Ref Range Status   Specimen Description   Final    BLOOD RIGHT FOREARM Performed at [facility] [address], [place], [place] [address]    Special Requests   Final    BOTTLES DRAWN AEROBIC AND ANAEROBIC Blood Culture adequate volume Performed at [facility] [address] [place], [place] [address]    Culture   Final    NO GROWTH 5 DAYS Performed at [facility], [place] [address] [place], [place] [address]    Report Status 05/28/2019 FINAL  Final  MRSA PCR Screening     Status: None   Collection Time: 05/23/19  2:55 PM   Specimen: Nasal Mucosa; Nasopharyngeal  Result Value Ref Range Status   MRSA by PCR NEGATIVE NEGATIVE Final    Comment:        The GeneXpert MRSA Assay (FDA approved for NASAL specimens only), is one component of a comprehensive MRSA colonization surveillance program. It is not intended to diagnose MRSA infection nor to guide or monitor treatment for MRSA infections. Performed at [facility], [address] [place], [place] [address]       Radiology Studies: Dg Chest Port 1 View  Result Date: 05/31/2019 CLINICAL DATA:  COVID-19 positive.  Shortness of breath. EXAM: PORTABLE CHEST 1 VIEW COMPARISON:  May 25, 2019 FINDINGS: The heart, hila, and mediastinum are unremarkable. No pneumothorax. Decreased pulmonary opacities.  Minimal opacity remains in the lateral right upper lobe and the right base. More prominent opacity in the left base is similar with a somewhat streaky appearance. No other acute abnormalities. IMPRESSION: 1. Improving pulmonary infiltrates. Streaky opacity remains in the left base consistent with persistent infiltrate as it was not present on the May 19, 2019 study. No other acute abnormalities. Electronically Signed   By: Dorise Bullion III M.D   On: 05/31/2019 08:55       LOS: 9 days   [facility] Pager on [email or web address]  06/01/2019, 10:14 AM

## 2019-06-01 NOTE — Progress Notes (Addendum)
0816  Resting in bed with eyes closed.  Awakens easily.  O2 sat 95% on 3L.  HR 50's while sleeping, 78-80 while awake   1215  Per PT/OT, patient O2 sat dropped to 87% on 3L/[place] with minimal activity.  C/o lightheadedness/dizziness.  BP dropped 82/61.  After a brief rest, she was able to continue walking and sat in chair.  BP returned to 101/71  1500  Lying prone position.  O2 sats 97% on 3l/[place]

## 2019-06-01 NOTE — Plan of Care (Signed)
  Problem: Education: Goal: Knowledge of General Education information will improve Description: Including pain rating scale, medication(s)/side effects and non-pharmacologic comfort measures Outcome: Progressing   Problem: Health Behavior/Discharge Planning: Goal: Ability to manage health-related needs will improve Outcome: Progressing   Problem: Clinical Measurements: Goal: Ability to maintain clinical measurements within normal limits will improve Outcome: Progressing Goal: Diagnostic test results will improve Outcome: Progressing Goal: Respiratory complications will improve Outcome: Progressing   Problem: Activity: Goal: Risk for activity intolerance will decrease Outcome: Progressing   Problem: Nutrition: Goal: Adequate nutrition will be maintained Outcome: Progressing   Problem: Safety: Goal: Ability to remain free from injury will improve Outcome: Progressing   Problem: Skin Integrity: Goal: Risk for impaired skin integrity will decrease Outcome: Progressing

## 2019-06-01 NOTE — Progress Notes (Deleted)
Called and spoke with patient's son on phone.  He was able to interpret for father.  Patient complaining of "bitter" taste in mouth.  He is agreeable to brushing his teeth.  He wants one of our [employer] meals since dinner is cold.  Updated son of patient's progress to date.  All questions addressed.  Earleen Reaper RN

## 2019-06-01 NOTE — Progress Notes (Signed)
Occupational Therapy Treatment Patient Details Name: Diana Roach MRN: [id number] DOB: 28-Oct-1950 Today's Date: 06/01/2019    History of present illness 68 yo female with a history of asthma and MD who was diagnosed with COVID-19 on July 23 and presented to the ED 7/29 with complaints of worsening shortness of breath.8/1 Code Blue - 4.5 sec pause - atropine - no CPR - transfer to ICU   OT comments  Pt progressing towards OT goals and motivated to work with therapies. Pt tolerated functional mobility within room using RW and overall minA (+2 safety/equipment). Pt initially only tolerating short distance prior to requiring seated rest break due to dizziness/lightheadedness; initiated sitting rest break with BP 82/61. After seated rest break pt able to tolerate further distance and reports symptoms subsiding (BP after return to sitting in recliner end of session: 101/71). Pt completing toileting and standing grooming ADL with min-modA throughout. Use of 3L O2 with SpO2 >=87%. Feel POC remains appropriate at this time. Will continue to follow.   Follow Up Recommendations  Home health OT;Supervision/Assistance - 24 hour    Equipment Recommendations  Tub/shower seat;3 in 1 bedside commode          Precautions / Restrictions Precautions Precautions: Fall Precaution Comments: monitor sats on HFNC Restrictions Weight Bearing Restrictions: No       Mobility Bed Mobility Overal bed mobility: Needs Assistance Bed Mobility: Supine to Sit Rolling: Min guard   Supine to sit: Min guard     General bed mobility comments: for safety, lines  Transfers Overall transfer level: Needs assistance Equipment used: Rolling walker (2 wheeled);None Transfers: Sit to/from Stand Sit to Stand: Min assist;+2 safety/equipment         General transfer comment: Min A to power up into standing and then maintain balance     Balance Overall balance assessment: Needs assistance Sitting-balance  support: Bilateral upper extremity supported Sitting balance-Leahy Scale: Fair     Standing balance support: During functional activity;Bilateral upper extremity supported Standing balance-Leahy Scale: Fair Standing balance comment: relies on UE                           ADL either performed or assessed with clinical judgement   ADL Overall ADL's : Needs assistance/impaired     Grooming: Wash/dry hands;Minimal assistance;Standing Grooming Details (indicate cue type and reason): pt using hand sanitizer in standing with minA for balance                 Toilet Transfer: Minimal assistance;+2 for safety/equipment;BSC;Ambulation;RW Toilet Transfer Details (indicate cue type and reason): attempted ambulation to bathroom; pt made it approx 50% of distance to bathroom (starting from far side of bed, ambulating to just past foot of bed), prior to needing seated rest break, largely due to dizziness. post toileting pt was then able to ambulate to door prior to return back to [place] and Hygiene: Moderate assistance;Sit to/from stand;Sitting/lateral lean Toileting - Clothing Manipulation Details (indicate cue type and reason): assist for clothing management (gown and mesh underwear); pt performing peri-care after voiding bladder and BM via lateral leans, with additional assist provided in standing to ensure thoroughness      Functional mobility during ADLs: Minimal assistance;+2 for safety/equipment;Rolling walker                         Cognition Arousal/Alertness: Awake/alert Behavior During Therapy: [facility] for tasks assessed/performed Overall Cognitive  Status: Within Functional Limits for tasks assessed                                          Exercises     Shoulder Instructions       General Comments      Pertinent Vitals/ Pain       Pain Assessment: Faces Faces Pain Scale: Hurts a little bit Pain  Location: Generalized Pain Descriptors / Indicators: Discomfort Pain Intervention(s): Monitored during session  Home Living                                          Prior Functioning/Environment              Frequency  Min 3X/week        Progress Toward Goals  OT Goals(current goals can now be found in the care plan section)  Progress towards OT goals: Progressing toward goals  Acute Rehab OT Goals Patient Stated Goal: to go home, do things again OT Goal Formulation: With patient Time For Goal Achievement: 06/13/19 Potential to Achieve Goals: Good ADL Goals Pt Will Perform Grooming: with min guard assist;standing Pt Will Perform Lower Body Dressing: with min guard assist;sit to/from stand;sitting/lateral leans Pt Will Transfer to Toilet: with min guard assist;ambulating;regular height toilet Pt Will Perform Toileting - Clothing Manipulation and hygiene: with supervision;sitting/lateral leans;sit to/from stand Pt/caregiver will Perform Home Exercise Program: Increased strength;Both right and left upper extremity;With theraband;With written HEP provided;Independently Additional ADL Goal #1: Pt will independently verablize three energy conservation strategies for ADLs/IADLs  Plan Discharge plan remains appropriate    Co-evaluation    PT/OT/SLP Co-Evaluation/Treatment: Yes Reason for Co-Treatment: For patient/therapist safety;Complexity of the patient's impairments (multi-system involvement);To address functional/ADL transfers   OT goals addressed during session: ADL's and self-care      AM-PAC OT "6 Clicks" Daily Activity     Outcome Measure   Help from another person eating meals?: None Help from another person taking care of personal grooming?: A Little Help from another person toileting, which includes using toliet, bedpan, or urinal?: A Lot Help from another person bathing (including washing, rinsing, drying)?: A Lot Help from another person  to put on and taking off regular upper body clothing?: A Little Help from another person to put on and taking off regular lower body clothing?: A Lot 6 Click Score: 16    End of Session Equipment Utilized During Treatment: Rolling walker;Oxygen(3L)  OT Visit Diagnosis: Unsteadiness on feet (R26.81);Other abnormalities of gait and mobility (R26.89);Muscle weakness (generalized) (M62.81)   Activity Tolerance Patient tolerated treatment well   Patient Left with call bell/phone within reach;in chair   Nurse Communication Mobility status        Time: 0388-8280 OT Time Calculation (min): 23 min  Charges: OT General Charges $OT Visit: 1 Visit OT Treatments $Self Care/Home Management : 8-22 mins  Lou Cal, OT [facility] Pager [phone] Office [place] 06/01/2019, 1:45 PM

## 2019-06-02 LAB — COMPREHENSIVE METABOLIC PANEL
ALT: 29 U/L (ref 0–44)
AST: 20 U/L (ref 15–41)
Albumin: 3.1 g/dL — ABNORMAL LOW (ref 3.5–5.0)
Alkaline Phosphatase: 52 U/L (ref 38–126)
Anion gap: 12 (ref 5–15)
BUN: 32 mg/dL — ABNORMAL HIGH (ref 8–23)
CO2: 28 mmol/L (ref 22–32)
Calcium: 8.9 mg/dL (ref 8.9–10.3)
Chloride: 91 mmol/L — ABNORMAL LOW (ref 98–111)
Creatinine, Ser: 0.86 mg/dL (ref 0.44–1.00)
GFR calc Af Amer: >60 mL/min (ref >60)
GFR calc non Af Amer: >60 mL/min (ref >60)
Glucose, Bld: 197 mg/dL — ABNORMAL HIGH (ref 70–99)
Potassium: 4.2 mmol/L (ref 3.5–5.1)
Sodium: 131 mmol/L — ABNORMAL LOW (ref 135–145)
Total Bilirubin: 0.6 mg/dL (ref 0.3–1.2)
Total Protein: 6 g/dL — ABNORMAL LOW (ref 6.5–8.1)

## 2019-06-02 LAB — CBC
HCT: 37.4 % (ref 36.0–46.0)
Hemoglobin: 11.9 g/dL — ABNORMAL LOW (ref 12.0–15.0)
MCH: 25.3 pg — ABNORMAL LOW (ref 26.0–34.0)
MCHC: 31.8 g/dL (ref 30.0–36.0)
MCV: 79.6 fL — ABNORMAL LOW (ref 80.0–100.0)
Platelets: 486 10*3/uL — ABNORMAL HIGH (ref 150–400)
RBC: 4.7 MIL/uL (ref 3.87–5.11)
RDW: 15.9 % — ABNORMAL HIGH (ref 11.5–15.5)
WBC: 13.4 10*3/uL — ABNORMAL HIGH (ref 4.0–10.5)
nRBC: 0 % (ref 0.0–0.2)

## 2019-06-02 LAB — GLUCOSE, CAPILLARY
Glucose-Capillary: 144 mg/dL — ABNORMAL HIGH (ref 70–99)
Glucose-Capillary: 208 mg/dL — ABNORMAL HIGH (ref 70–99)
Glucose-Capillary: 225 mg/dL — ABNORMAL HIGH (ref 70–99)
Glucose-Capillary: 212 mg/dL — ABNORMAL HIGH (ref 70–99)
Glucose-Capillary: 217 mg/dL — ABNORMAL HIGH (ref 70–99)

## 2019-06-02 NOTE — Progress Notes (Signed)
Called and updated patient's daughter, Olin Hauser.  All concerns addressed.  Earleen Reaper RN

## 2019-06-02 NOTE — Progress Notes (Signed)
PROGRESS NOTE  Diana Roach ZJI:[id number] DOB: April 22, 1951 DOA: 05/23/2019  PCP: Leeanne Rio, MD  Brief History/Interval Summary: 68 year old with a history of asthma and DM who was diagnosed with COVID-19 on July 23 and presented to the ED 7/29 with complaints of worsening shortness of breath.  She was found to have a temperature of 102 and was saturating 80% on room air.  A chest x-ray noted hazy bibasilar opacities.  She originally presented to the ED on July 25th with shortness of breath but at that time was stable enough to be discharged home on prednisone.  Presented back on 7/29 and was hospitalized.  Had a CODE BLUE arrest on 8/1 with a four-point second pause.  Was given atropine and underwent CPR was transferred to the ICU.  Subsequently transferred back to PCU.  CT angiogram was negative for PE.  Reason for Visit: Acute respiratory disease due to COVID-19  Consultants: None  Procedures: None  Antibiotics: Anti-infectives (From admission, onward)   Start     Dose/Rate Route Frequency Ordered Stop   05/27/19 1800  remdesivir 100 mg in sodium chloride 0.9 % 250 mL IVPB     100 mg 500 mL/hr over 30 Minutes Intravenous Every 24 hours 05/26/19 1826 05/27/19 1821   05/25/19 1000  azithromycin (ZITHROMAX) tablet 500 mg  Status:  Discontinued     500 mg Oral Daily 05/24/19 0955 05/24/19 1218   05/24/19 1400  remdesivir 100 mg in sodium chloride 0.9 % 250 mL IVPB     100 mg 500 mL/hr over 30 Minutes Intravenous Every 24 hours 05/23/19 1224 05/26/19 1900   05/24/19 1300  cefTRIAXone (ROCEPHIN) 1 g in sodium chloride 0.9 % 100 mL IVPB  Status:  Discontinued     1 g 200 mL/hr over 30 Minutes Intravenous Every 24 hours 05/24/19 0955 05/24/19 1218   05/23/19 1400  remdesivir 200 mg in sodium chloride 0.9 % 250 mL IVPB     200 mg 500 mL/hr over 30 Minutes Intravenous Once 05/23/19 1224 05/23/19 1658   05/23/19 1230  cefTRIAXone (ROCEPHIN) 1 g in sodium chloride 0.9 % 100 mL  IVPB  Status:  Discontinued     1 g 200 mL/hr over 30 Minutes Intravenous Every 24 hours 05/23/19 1207 05/24/19 0953   05/23/19 1230  azithromycin (ZITHROMAX) 500 mg in sodium chloride 0.9 % 250 mL IVPB  Status:  Discontinued     500 mg 250 mL/hr over 60 Minutes Intravenous Every 24 hours 05/23/19 1207 05/24/19 0953       Subjective/Interval History: Patient continues to feel better.  Did get a little dizzy when she ambulated with physical therapy yesterday.  Shortness of breath is improved.  No chest pain.  Occasional blood when she blows her nose.      Assessment/Plan:  Acute Hypoxic Resp. Failure due to Acute Covid 19 Viral Illness  Fever: Remains afebrile Oxygen requirements: Nasal cannula.  2 L/min.  Saturating in the 90s.   Antibacterials: No longer on antibiotics Remdesivir: Completed course Steroids: Dexamethasone being tapered. Diuretics: She was given Lasix for 2 days.  Not given the last 24 hours. Actemra: 1 dose given on 7/29 DVT Prophylaxis:  Lovenox 40 mg once a day  Research Studies: Not enrolled into research studies  Patient's respiratory status has been improving.  Diuretics did help but we are holding further doses due to orthostatic hypotension.  Patient is now down to 2 L of oxygen by nasal cannula.  Awake prone  position as much as possible.  Incentive spirometry and mobilization.  Patient's inflammatory markers had improved.  Leukocytosis was due to steroids.  PT and OT evaluating.  Cardiac pause/bradycardia Patient apparently had cardiac arrest.  She had a greater than 4-second pause.  She underwent CPR and was given atropine.  Transferred to the ICU.  Stabilized.  Did not have any recurrence.  She was transferred back to the floor.  Remains on telemetry.  TSH normal.  Echocardiogram done recently in May showed normal systolic function.  She has been stable from a cardiac standpoint.  She is noted to have sinus bradycardia when she is asleep.   Hyponatremia  Possibly due to diuretics.  Sodium is 131 today.  Slightly lower than yesterday.  Continue to monitor.    Diabetes mellitus type 2 HbA1c 7.4.  CBG should improve as steroid is tapered down.  Continue to monitor.   History of asthma Stable.  DVT Prophylaxis: Lovenox PUD Prophylaxis: On Protonix Code Status: Full code Family Communication: Discussed with patient.  Daughter being updated on a daily basis.   Disposition Plan: Will need home oxygen assessment.  Hopefully discharge to home on Monday.   Medications:  Scheduled: . dexamethasone  4 mg Oral Daily  . enoxaparin (LOVENOX) injection  40 mg Subcutaneous Q24H  . feeding supplement (ENSURE ENLIVE)  237 mL Oral BID BM  . fluticasone  2 spray Each Nare Daily  . fluticasone  1 puff Inhalation BID  . guaiFENesin  600 mg Oral BID  . insulin aspart  0-15 Units Subcutaneous TID WC  . loratadine  10 mg Oral Daily  . mouth rinse  15 mL Mouth Rinse BID  . pantoprazole  40 mg Oral Daily  . polyethylene glycol  17 g Oral BID  . senna-docusate  1 tablet Oral BID  . sodium chloride flush  3 mL Intravenous Q12H   Continuous: . sodium chloride Stopped (05/27/19 1932)   HBZ:JIRCVE chloride, acetaminophen, albuterol, benzonatate, bisacodyl, guaiFENesin-dextromethorphan, hydrALAZINE, ondansetron **OR** ondansetron (ZOFRAN) IV, sodium chloride flush, sodium phosphate   Objective:  Vital Signs  Vitals:   06/02/19 0600 06/02/19 0745 06/02/19 0748 06/02/19 0749  BP:   107/71   Pulse: 63 (!) 57    Resp: 14 14 16    Temp:   97.6 F (36.4 C)   TempSrc:      SpO2: 96% 99%  96%  Weight:      Height:        Intake/Output Summary (Last 24 hours) at 06/02/2019 1002 Last data filed at 06/02/2019 0600 Gross per 24 hour  Intake 1470 ml  Output 250 ml  Net 1220 ml   Filed Weights   05/23/19 1442  Weight: 64.9 kg    General appearance: Awake alert.  In no distress Resp: Normal effort at rest.  Coarse breath sounds bilaterally with few  crackles at the bases.  No wheezing or rhonchi.   Cardio: S1-S2 is normal regular.  No S3-S4.  No rubs murmurs or bruit GI: Abdomen is soft.  Nontender nondistended.  Bowel sounds are present normal.  No masses organomegaly Extremities: No edema.  Full range of motion of lower extremities. Neurologic: Alert and oriented x3.  No focal neurological deficits.      Lab Results:  Data Reviewed: I have personally reviewed following labs and imaging studies  CBC: Recent Labs  Lab 05/28/19 0125 05/29/19 0140 05/31/19 0345 06/01/19 0130 06/02/19 0141  WBC 8.7 10.0 15.6* 16.9* 13.4*  HGB 11.7* 11.7*  12.5 12.5 11.9*  HCT 37.0 37.6 40.0 39.4 37.4  MCV 79.4* 80.7 79.7* 79.0* 79.6*  PLT 459* 498* 537* 529* 486*    Basic Metabolic Panel: Recent Labs  Lab 05/28/19 0125 05/29/19 0140 05/31/19 0345 06/01/19 0130 06/02/19 0141  NA 138 137 133* 133* 131*  K 4.0 4.1 4.5 3.8 4.2  CL 105 102 95* 92* 91*  CO2 22 24 28 29 28   GLUCOSE 166* 177* 175* 166* 197*  BUN 28* 23 28* 32* 32*  CREATININE 0.86 0.86 0.90 0.95 0.86  CALCIUM 8.4* 8.8* 9.1 8.9 8.9    GFR: Estimated Creatinine Clearance: 54 mL/min (by C-G formula based on SCr of 0.86 mg/dL).  Liver Function Tests: Recent Labs  Lab 05/28/19 0125 05/29/19 0140 05/31/19 0345 06/01/19 0130 06/02/19 0141  AST 28 28 24 20 20   ALT 29 29 32 30 29  ALKPHOS 59 60 60 59 52  BILITOT 0.5 0.4 1.0 1.0 0.6  PROT 5.8* 5.9* 6.8 6.8 6.0*  ALBUMIN 2.9* 3.0* 3.3* 3.5 3.1*     CBG: Recent Labs  Lab 06/01/19 0732 06/01/19 1148 06/01/19 1649 06/01/19 2004 06/02/19 0808  GLUCAP 123* 144* 235* 204* 144*      Recent Results (from the past 240 hour(s))  MRSA PCR Screening     Status: None   Collection Time: 05/23/19  2:55 PM   Specimen: Nasal Mucosa; Nasopharyngeal  Result Value Ref Range Status   MRSA by PCR NEGATIVE NEGATIVE Final    Comment:        The GeneXpert MRSA Assay (FDA approved for NASAL specimens only), is one  component of a comprehensive MRSA colonization surveillance program. It is not intended to diagnose MRSA infection nor to guide or monitor treatment for MRSA infections. Performed at [facility], [place] [address], [place], [place] [address]       Radiology Studies: No results found.     LOS: 10 days   Dandrea Medders [employer] on [email or web address]  06/02/2019, 10:02 AM

## 2019-06-02 NOTE — Plan of Care (Signed)
  Problem: Education: Goal: Knowledge of General Education information will improve Description: Including pain rating scale, medication(s)/side effects and non-pharmacologic comfort measures Outcome: Progressing   Problem: Health Behavior/Discharge Planning: Goal: Ability to manage health-related needs will improve Outcome: Progressing   Problem: Clinical Measurements: Goal: Ability to maintain clinical measurements within normal limits will improve Outcome: Progressing Goal: Diagnostic test results will improve Outcome: Progressing Goal: Respiratory complications will improve Outcome: Progressing Goal: Cardiovascular complication will be avoided Outcome: Progressing   Problem: Activity: Goal: Risk for activity intolerance will decrease Outcome: Progressing   Problem: Nutrition: Goal: Adequate nutrition will be maintained Outcome: Progressing   Problem: Elimination: Goal: Will not experience complications related to bowel motility Outcome: Progressing Goal: Will not experience complications related to urinary retention Outcome: Progressing   Problem: Safety: Goal: Ability to remain free from injury will improve Outcome: Progressing   Problem: Skin Integrity: Goal: Risk for impaired skin integrity will decrease Outcome: Progressing

## 2019-06-02 NOTE — Progress Notes (Addendum)
0750  Resting in bed in prone position.  Rotated supine without difficulty. O2 sat 96% on 2l.    1000  Assisted to [facility].  Urine and stool x 1.  Tolerated activity well.  Minimal short of breath.  O2 sats mid to upper 90's throughout.  Returned to sitting at edge of bed.

## 2019-06-03 LAB — BASIC METABOLIC PANEL
Anion gap: 12 (ref 5–15)
BUN: 26 mg/dL — ABNORMAL HIGH (ref 8–23)
CO2: 26 mmol/L (ref 22–32)
Calcium: 8.8 mg/dL — ABNORMAL LOW (ref 8.9–10.3)
Chloride: 97 mmol/L — ABNORMAL LOW (ref 98–111)
Creatinine, Ser: 0.75 mg/dL (ref 0.44–1.00)
GFR calc Af Amer: >60 mL/min (ref >60)
GFR calc non Af Amer: >60 mL/min (ref >60)
Glucose, Bld: 193 mg/dL — ABNORMAL HIGH (ref 70–99)
Potassium: 4 mmol/L (ref 3.5–5.1)
Sodium: 135 mmol/L (ref 135–145)

## 2019-06-03 LAB — GLUCOSE, CAPILLARY
Glucose-Capillary: 102 mg/dL — ABNORMAL HIGH (ref 70–99)
Glucose-Capillary: 157 mg/dL — ABNORMAL HIGH (ref 70–99)
Glucose-Capillary: 153 mg/dL — ABNORMAL HIGH (ref 70–99)
Glucose-Capillary: 191 mg/dL — ABNORMAL HIGH (ref 70–99)

## 2019-06-03 MED ORDER — DEXAMETHASONE 4 MG PO TABS
2.0000 mg | ORAL_TABLET | Freq: Every day | ORAL | Status: DC
  Administered 2019-06-04: 10:00:00 2 mg via ORAL
  Filled 2019-06-03: qty 1

## 2019-06-03 MED ORDER — SENNOSIDES-DOCUSATE SODIUM 8.6-50 MG PO TABS
1.0000 | ORAL_TABLET | Freq: Every day | ORAL | Status: DC
  Administered 2019-06-03: 22:00:00 1 via ORAL
  Filled 2019-06-03: qty 1

## 2019-06-03 MED ORDER — POLYETHYLENE GLYCOL 3350 17 G PO PACK
17.0000 g | PACK | Freq: Every day | ORAL | Status: DC
  Filled 2019-06-03: qty 1

## 2019-06-03 NOTE — Progress Notes (Addendum)
Occupational Therapy Treatment Patient Details Name: Diana Roach MRN: [id number] DOB: 1951-04-05 Today's Date: 06/03/2019    History of present illness 68 yo female with a history of asthma and MD who was diagnosed with COVID-19 on July 23 and presented to the ED 7/29 with complaints of worsening shortness of breath.8/1 Code Blue - 4.5 sec pause - atropine - no CPR - transfer to ICU   OT comments  Pt continues to progress towards OT Goals, presents seated EOB, pleasant and willing to participate in therapy session. Pt tolerating session on RA, with lowest O2 sat noted 88% with short distance mobility in room, overall maintaining >91% during session. Pt requiring minA for functional mobility (use of HHA) (approx 5', x2), minguard assist for toileting task. Issued level 1 theraband during session and instructed pt in bil UE and LE HEP; pt return demonstrating good understanding. She continues to fatigue easily and requires rest breaks throughout. Pt reports having ambulated with RN staff just recently and plans to do so again this evening. Will continue per POC.   Follow Up Recommendations  Home health OT;Supervision/Assistance - 24 hour    Equipment Recommendations  3 in 1 bedside commode;Tub/shower seat          Precautions / Restrictions Precautions Precautions: Fall Precaution Comments: monitor sats Restrictions Weight Bearing Restrictions: No       Mobility Bed Mobility Overal bed mobility: Needs Assistance             General bed mobility comments: pt seated EOB upon arrival  Transfers Overall transfer level: Needs assistance Equipment used: None Transfers: Sit to/from Stand Sit to Stand: Min guard         General transfer comment: for lines, safety and balance; pt standing statically prior to mobilizing to ensure no significant dizziness     Balance Overall balance assessment: Needs assistance   Sitting balance-Leahy Scale: Good     Standing balance  support: Single extremity supported;During functional activity Standing balance-Leahy Scale: Fair Standing balance comment: able to static stand with minguard for safety; reliant on at least single UE support with dynamic tasks                           ADL either performed or assessed with clinical judgement   ADL Overall ADL's : Needs assistance/impaired     Grooming: Wash/dry hands;Set up;Sitting                   Toilet Transfer: Minimal assistance;Ambulation;BSC Toilet Transfer Details (indicate cue type and reason): min HHA to ambulate approx 5' to [facility] in room  Toileting- Clothing Manipulation and Hygiene: Sitting/lateral lean;Sit to/from stand;Min guard Toileting - Clothing Manipulation Details (indicate cue type and reason): minguard for safety and balance; pt performing pericare via lateral leans and clothing management (gown and underwear) in standing     Functional mobility during ADLs: Minimal assistance(HHA) General ADL Comments: issued pt level 1 theraband and instructed pt in UB/LB exercise during session; pt continues to fatigue easily with activity but is very motivated     Technical brewer Arousal/Alertness: Awake/alert Behavior During Therapy: WFL for tasks assessed/performed Overall Cognitive Status: Within Functional Limits for tasks assessed  Exercises Exercises: General Upper Extremity;General Lower Extremity General Exercises - Upper Extremity Shoulder Horizontal ABduction: AROM;Both;Strengthening;10 reps;Theraband Theraband Level (Shoulder Horizontal Abduction): Level 1 (Yellow) Shoulder Horizontal ADduction: AROM;Both;Strengthening;10 reps;Theraband Theraband Level (Shoulder Horizontal Adduction): Level 1 (Yellow) Elbow Flexion: AROM;Both;5 reps;Seated;Theraband Theraband Level (Elbow Flexion): Level 1 (Yellow) Elbow Extension: AROM;Both;5  reps;Seated;Theraband Theraband Level (Elbow Extension): Level 1 (Yellow) General Exercises - Lower Extremity Long Arc Quad: AROM;Both;10 reps Hip Flexion/Marching: AROM;Both;10 reps   Shoulder Instructions       General Comments      Pertinent Vitals/ Pain       Pain Assessment: No/denies pain  Home Living                                          Prior Functioning/Environment              Frequency  Min 3X/week        Progress Toward Goals  OT Goals(current goals can now be found in the care plan section)  Progress towards OT goals: Progressing toward goals  Acute Rehab OT Goals Patient Stated Goal: to go home, do things again OT Goal Formulation: With patient Time For Goal Achievement: 06/13/19 Potential to Achieve Goals: Good ADL Goals Pt Will Perform Grooming: with min guard assist;standing Pt Will Perform Lower Body Dressing: with min guard assist;sit to/from stand;sitting/lateral leans Pt Will Transfer to Toilet: with min guard assist;ambulating;regular height toilet Pt Will Perform Toileting - Clothing Manipulation and hygiene: with supervision;sitting/lateral leans;sit to/from stand Pt/caregiver will Perform Home Exercise Program: Increased strength;Both right and left upper extremity;With theraband;With written HEP provided;Independently Additional ADL Goal #1: Pt will independently verablize three energy conservation strategies for ADLs/IADLs  Plan Discharge plan remains appropriate    Co-evaluation                 AM-PAC OT "6 Clicks" Daily Activity     Outcome Measure   Help from another person eating meals?: None Help from another person taking care of personal grooming?: A Little Help from another person toileting, which includes using toliet, bedpan, or urinal?: A Little Help from another person bathing (including washing, rinsing, drying)?: A Little Help from another person to put on and taking off regular upper body  clothing?: None Help from another person to put on and taking off regular lower body clothing?: A Little 6 Click Score: 20    End of Session    OT Visit Diagnosis: Unsteadiness on feet (R26.81);Other abnormalities of gait and mobility (R26.89);Muscle weakness (generalized) (M62.81)   Activity Tolerance Patient tolerated treatment well   Patient Left in bed;with call bell/phone within reach   Nurse Communication Mobility status        Time: 3435-6861 OT Time Calculation (min): 27 min  Charges: OT General Charges $OT Visit: 1 Visit OT Treatments $Self Care/Home Management : 8-22 mins $Therapeutic Exercise: 8-22 mins  Lou Cal, OT [facility] Pager [phone] Office [phone]    Raymondo Band 06/03/2019, 4:48 PM

## 2019-06-03 NOTE — Progress Notes (Signed)
Called and updated patient's daughter, Olin Hauser, on patient's progress during the day.  All concerns addressed.  Earleen Reaper RN

## 2019-06-03 NOTE — Progress Notes (Signed)
Patient ambulated in hallway on room air about 100 feet.  Tolerance is low and she did require one sitting rest break.  She does require contact guard assistance.  Oxygen levels stayed greater than 92% on room air.  Earleen Reaper RN

## 2019-06-03 NOTE — Progress Notes (Addendum)
0750  Sitting up in bed.  Using incentive spirometer.  O2 sat 95% on 2L.  Assisted to [facility].  Stool and urine x 1.  Transferred to chair.  Tolerated well  0909  Transferred to [facility].  Stool x 1.  Back to chair safely.  Call light in reach.  1206  Transferred from chair to [facility] without assistance, only observation.  Stool and urine x 1.  Back to chair safely.  Call light in reach.  1430  Resting O2 sat on room air is 95%.  Ambulated approximately 19ft in hallway.  O2 sat dropped to 90%.  C/o "a little" dizziness and some shortness of breath noted.  Back to the room she was able to stand at the sink and brush her teeth and wash her hands.  Maintaining O2 sat 92-95%.  Ambulated to the [facility] and then to bed.  Breathing normally after about 5 min rest period.  O2 sat 95% on room air.    1540  Resting in bed.  O2 sat 92% on room air.  1745  Sitting up in bed eating dinner.  O2 sat 95% on room air

## 2019-06-03 NOTE — Progress Notes (Signed)
Patient ambulated in hallway with contact guard assist x 1.  She tired very easily.  Oxygen levels stayed between 90% - 92% on 2 LPM .  Earleen Reaper RN

## 2019-06-03 NOTE — Plan of Care (Signed)
  Problem: Education: Goal: Knowledge of General Education information will improve Description: Including pain rating scale, medication(s)/side effects and non-pharmacologic comfort measures 06/03/2019 1107 by Phyllis Ginger, RN Outcome: Progressing 06/03/2019 1106 by Phyllis Ginger, RN Outcome: Progressing   Problem: Health Behavior/Discharge Planning: Goal: Ability to manage health-related needs will improve 06/03/2019 1107 by Phyllis Ginger, RN Outcome: Progressing 06/03/2019 1106 by Phyllis Ginger, RN Outcome: Progressing   Problem: Clinical Measurements: Goal: Ability to maintain clinical measurements within normal limits will improve 06/03/2019 1107 by Phyllis Ginger, RN Outcome: Progressing 06/03/2019 1106 by Phyllis Ginger, RN Outcome: Progressing Goal: Diagnostic test results will improve 06/03/2019 1107 by Phyllis Ginger, RN Outcome: Progressing 06/03/2019 1106 by Phyllis Ginger, RN Outcome: Progressing Goal: Respiratory complications will improve 06/03/2019 1107 by Phyllis Ginger, RN Outcome: Progressing 06/03/2019 1106 by Phyllis Ginger, RN Outcome: Progressing Goal: Cardiovascular complication will be avoided 06/03/2019 1107 by Phyllis Ginger, RN Outcome: Progressing 06/03/2019 1106 by Phyllis Ginger, RN Outcome: Progressing   Problem: Activity: Goal: Risk for activity intolerance will decrease 06/03/2019 1107 by Phyllis Ginger, RN Outcome: Progressing 06/03/2019 1106 by Phyllis Ginger, RN Outcome: Progressing   Problem: Nutrition: Goal: Adequate nutrition will be maintained 06/03/2019 1107 by Phyllis Ginger, RN Outcome: Progressing 06/03/2019 1106 by Phyllis Ginger, RN Outcome: Progressing   Problem: Elimination: Goal: Will not experience complications related to bowel motility 06/03/2019 1107 by Phyllis Ginger, RN Outcome: Progressing 06/03/2019 1106 by Phyllis Ginger, RN Outcome: Progressing Goal: Will not experience complications related to urinary  retention 06/03/2019 1107 by Phyllis Ginger, RN Outcome: Progressing 06/03/2019 1106 by Phyllis Ginger, RN Outcome: Progressing   Problem: Safety: Goal: Ability to remain free from injury will improve 06/03/2019 1107 by Phyllis Ginger, RN Outcome: Progressing 06/03/2019 1106 by Phyllis Ginger, RN Outcome: Progressing   Problem: Skin Integrity: Goal: Risk for impaired skin integrity will decrease 06/03/2019 1107 by Phyllis Ginger, RN Outcome: Progressing 06/03/2019 1106 by Phyllis Ginger, RN Outcome: Progressing   Problem: Education: Goal: Knowledge of risk factors and measures for prevention of condition will improve Outcome: Progressing   Problem: Respiratory: Goal: Will maintain a patent airway Outcome: Progressing Goal: Complications related to the disease process, condition or treatment will be avoided or minimized Outcome: Progressing

## 2019-06-03 NOTE — Progress Notes (Signed)
PROGRESS NOTE  Diana Roach PIR:[id number] DOB: September 27, 1951 DOA: 05/23/2019  PCP: Leeanne Rio, MD  Brief History/Interval Summary: 68 year old with a history of asthma and DM who was diagnosed with COVID-19 on July 23 and presented to the ED 7/29 with complaints of worsening shortness of breath.  She was found to have a temperature of 102 and was saturating 80% on room air.  A chest x-ray noted hazy bibasilar opacities.  She originally presented to the ED on July 25th with shortness of breath but at that time was stable enough to be discharged home on prednisone.  Presented back on 7/29 and was hospitalized.  Had a CODE BLUE arrest on 8/1 with a four-point second pause.  Was given atropine and underwent CPR was transferred to the ICU.  Subsequently transferred back to PCU.  CT angiogram was negative for PE.  Reason for Visit: Acute respiratory disease due to COVID-19  Consultants: None  Procedures: None  Antibiotics: Anti-infectives (From admission, onward)   Start     Dose/Rate Route Frequency Ordered Stop   05/27/19 1800  remdesivir 100 mg in sodium chloride 0.9 % 250 mL IVPB     100 mg 500 mL/hr over 30 Minutes Intravenous Every 24 hours 05/26/19 1826 05/27/19 1821   05/25/19 1000  azithromycin (ZITHROMAX) tablet 500 mg  Status:  Discontinued     500 mg Oral Daily 05/24/19 0955 05/24/19 1218   05/24/19 1400  remdesivir 100 mg in sodium chloride 0.9 % 250 mL IVPB     100 mg 500 mL/hr over 30 Minutes Intravenous Every 24 hours 05/23/19 1224 05/26/19 1900   05/24/19 1300  cefTRIAXone (ROCEPHIN) 1 g in sodium chloride 0.9 % 100 mL IVPB  Status:  Discontinued     1 g 200 mL/hr over 30 Minutes Intravenous Every 24 hours 05/24/19 0955 05/24/19 1218   05/23/19 1400  remdesivir 200 mg in sodium chloride 0.9 % 250 mL IVPB     200 mg 500 mL/hr over 30 Minutes Intravenous Once 05/23/19 1224 05/23/19 1658   05/23/19 1230  cefTRIAXone (ROCEPHIN) 1 g in sodium chloride 0.9 % 100 mL  IVPB  Status:  Discontinued     1 g 200 mL/hr over 30 Minutes Intravenous Every 24 hours 05/23/19 1207 05/24/19 0953   05/23/19 1230  azithromycin (ZITHROMAX) 500 mg in sodium chloride 0.9 % 250 mL IVPB  Status:  Discontinued     500 mg 250 mL/hr over 60 Minutes Intravenous Every 24 hours 05/23/19 1207 05/24/19 0953       Subjective/Interval History: Patient denies any new complaints at this morning.  Slept well overnight.  Shortness of breath continues to improve.  Occasional cough.  No chest pain.  Having loose stools now.      Assessment/Plan:  Acute Hypoxic Resp. Failure due to Acute Covid 19 Viral Illness  Fever: Remains afebrile. Oxygen requirements: Nasal cannula.  2 L/min.  Saturating in the 90s.Marland Kitchen   Antibacterials: No longer on antibiotics Remdesivir: Completed course Steroids: Dexamethasone being tapered. Diuretics: She was given Lasix for 2 days.  None in the last couple of days. Actemra: 1 dose given on 7/29 DVT Prophylaxis:  Lovenox 40 mg once a day  Research Studies: Not enrolled into research studies  Patient's respiratory status has been improving.  Diuretics did help.  Did not continue daily doses due to orthostatic hypotension.  Patient did diurese well.  She is down to 2 L of oxygen by nasal cannula.  She is  completed the course of Remdesivir.  Steroids being tapered down.  Inflammatory markers have improved.  Continue to mobilize.  Incentive spirometry.  PT and OT evaluation.  Hopefully discharge in 24 to 48 hours.  Cardiac pause/bradycardia Patient developed significant bradycardia with a brief cardiac arrest on 8/1. She had a greater than 4-second pause. CODE BLUE was called.    She underwent CPR and was given atropine.  Transferred to the ICU.  Stabilized.  Did not have any recurrence.  She was transferred back to the floor.  Remains on telemetry.  TSH normal.  Echocardiogram done recently in May showed normal systolic function.  Patient remained stable from a  cardiac standpoint.  Occasionally bradycardic.  The episode thought to be triggered by significant hypoxemia.  Hyponatremia Possibly due to diuretics.  Sodium level has improved to 135 today.  Continue to monitor every so often.    Diabetes mellitus type 2 HbA1c 7.4.  CBGs are reasonably well controlled.  Should improve as steroid is tapered down.  Continue to monitor.   History of asthma Stable.  DVT Prophylaxis: Lovenox PUD Prophylaxis: On Protonix Code Status: Full code Family Communication: Discussed with patient.  Daughter being updated on a daily basis.   Disposition Plan: Continue to mobilize.  She will most likely need home oxygen.  PT and OT following.  Anticipate discharge in 24 to 48 hours.   Medications:  Scheduled: . dexamethasone  4 mg Oral Daily  . enoxaparin (LOVENOX) injection  40 mg Subcutaneous Q24H  . feeding supplement (ENSURE ENLIVE)  237 mL Oral BID BM  . fluticasone  2 spray Each Nare Daily  . fluticasone  1 puff Inhalation BID  . guaiFENesin  600 mg Oral BID  . insulin aspart  0-15 Units Subcutaneous TID WC  . loratadine  10 mg Oral Daily  . mouth rinse  15 mL Mouth Rinse BID  . pantoprazole  40 mg Oral Daily  . [START ON 06/04/2019] polyethylene glycol  17 g Oral Daily  . senna-docusate  1 tablet Oral QHS  . sodium chloride flush  3 mL Intravenous Q12H   Continuous: . sodium chloride Stopped (05/27/19 1932)   SWH:QPRFFM chloride, acetaminophen, albuterol, benzonatate, bisacodyl, guaiFENesin-dextromethorphan, hydrALAZINE, ondansetron **OR** ondansetron (ZOFRAN) IV, sodium chloride flush, sodium phosphate   Objective:  Vital Signs  Vitals:   06/03/19 0600 06/03/19 0700 06/03/19 0744 06/03/19 0745  BP:   115/67   Pulse: (!) 55 78 62   Resp: 12 19 (!) 23   Temp:    97.8 F (36.6 C)  TempSrc:    Oral  SpO2: 97% 93% 95%   Weight:      Height:        Intake/Output Summary (Last 24 hours) at 06/03/2019 0951 Last data filed at 06/03/2019 0909  Gross per 24 hour  Intake 1320 ml  Output 605 ml  Net 715 ml   Filed Weights   05/23/19 1442  Weight: 64.9 kg    General appearance: Awake alert.  In no distress Resp: Normal effort at rest.  Coarse breath sounds bilaterally with few crackles at the bases.  No wheezing or rhonchi.  Cardio: S1-S2 is normal regular.  No S3-S4.  No rubs murmurs or bruit GI: Abdomen is soft.  Nontender nondistended.  Bowel sounds are present normal.  No masses organomegaly Extremities: No edema.  Full range of motion of lower extremities. Neurologic: Alert and oriented x3.  No focal neurological deficits.      Lab Results:  Data Reviewed: I have personally reviewed following labs and imaging studies  CBC: Recent Labs  Lab 05/28/19 0125 05/29/19 0140 05/31/19 0345 06/01/19 0130 06/02/19 0141  WBC 8.7 10.0 15.6* 16.9* 13.4*  HGB 11.7* 11.7* 12.5 12.5 11.9*  HCT 37.0 37.6 40.0 39.4 37.4  MCV 79.4* 80.7 79.7* 79.0* 79.6*  PLT 459* 498* 537* 529* 486*    Basic Metabolic Panel: Recent Labs  Lab 05/29/19 0140 05/31/19 0345 06/01/19 0130 06/02/19 0141 06/03/19 0114  NA 137 133* 133* 131* 135  K 4.1 4.5 3.8 4.2 4.0  CL 102 95* 92* 91* 97*  CO2 24 28 29 28 26   GLUCOSE 177* 175* 166* 197* 193*  BUN 23 28* 32* 32* 26*  CREATININE 0.86 0.90 0.95 0.86 0.75  CALCIUM 8.8* 9.1 8.9 8.9 8.8*    GFR: Estimated Creatinine Clearance: 58 mL/min (by C-G formula based on SCr of 0.75 mg/dL).  Liver Function Tests: Recent Labs  Lab 05/28/19 0125 05/29/19 0140 05/31/19 0345 06/01/19 0130 06/02/19 0141  AST 28 28 24 20 20   ALT 29 29 32 30 29  ALKPHOS 59 60 60 59 52  BILITOT 0.5 0.4 1.0 1.0 0.6  PROT 5.8* 5.9* 6.8 6.8 6.0*  ALBUMIN 2.9* 3.0* 3.3* 3.5 3.1*     CBG: Recent Labs  Lab 06/02/19 1151 06/02/19 1719 06/02/19 2102 06/02/19 2311 06/03/19 0851  GLUCAP 208* 225* 212* 217* 102*      No results found for this or any previous visit (from the past 240 hour(s)).    Radiology  Studies: No results found.     LOS: 11 days   Sharma Lawrance [employer] on [email or web address]  06/03/2019, 9:51 AM

## 2019-06-04 LAB — GLUCOSE, CAPILLARY
Glucose-Capillary: 117 mg/dL — ABNORMAL HIGH (ref 70–99)
Glucose-Capillary: 190 mg/dL — ABNORMAL HIGH (ref 70–99)

## 2019-06-04 MED ORDER — FLUTICASONE PROPIONATE 50 MCG/ACT NA SUSP: 2.0000 | Freq: Every day | NASAL | 0 refills | Status: DC

## 2019-06-04 MED ORDER — GUAIFENESIN-DM 100-10 MG/5ML PO SYRP: 5.0000 mL | ORAL_SOLUTION | ORAL | 0 refills | Status: DC | PRN

## 2019-06-04 MED ORDER — POLYETHYLENE GLYCOL 3350 17 G PO PACK: 17.0000 g | PACK | Freq: Every day | ORAL | 0 refills | Status: DC

## 2019-06-04 MED ORDER — PANTOPRAZOLE SODIUM 40 MG PO TBEC: 40.0000 mg | DELAYED_RELEASE_TABLET | Freq: Every day | ORAL | 0 refills | Status: DC

## 2019-06-04 MED ORDER — LORATADINE 10 MG PO TABS: 10.0000 mg | ORAL_TABLET | Freq: Every day | ORAL | 0 refills | Status: DC

## 2019-06-04 MED ORDER — DEXAMETHASONE 2 MG PO TABS: 2.0000 mg | ORAL_TABLET | Freq: Every day | ORAL | 0 refills | Status: AC

## 2019-06-04 NOTE — Progress Notes (Signed)
Patient d/ced with portable oxygen, walker and commode.  Daughter reports home oxygen delivered.

## 2019-06-04 NOTE — Progress Notes (Signed)
Occupational Therapy Treatment Patient Details Name: DELORA GRAVATT MRN: [id number] DOB: 1950-12-02 Today's Date: 06/04/2019    History of present illness 68 yo female with a history of asthma and MD who was diagnosed with COVID-19 on July 23 and presented to the ED 7/29 with complaints of worsening shortness of breath.8/1 Code Blue - 4.5 sec pause - atropine - no CPR - transfer to ICU   OT comments  Pt progressing towards established OT goals and very excited to dc home later today. Providing pt with education on safe tub transfer and use of 3N1 in the shower. Pt demonstrating understanding and performed simulated tub transfer with Supervision. Pt performing functional mobility in hallway with Min A for single hand held A for fatigue and balance; SpO2 93% on RA. Providing pt with handout and education on energy conservation for ADLs/IADLs and pt verbalized ways she will implement into daily routine. Continue to recommend dc to home with HHOT and will continue to follow acutely as admitted.    Follow Up Recommendations  Home health OT;Supervision/Assistance - 24 hour    Equipment Recommendations  3 in 1 bedside commode;Tub/shower seat    Recommendations for Other Services      Precautions / Restrictions Precautions Precautions: Fall Precaution Comments: monitor sats Restrictions Weight Bearing Restrictions: No       Mobility Bed Mobility               General bed mobility comments: pt seated EOB upon arrival  Transfers Overall transfer level: Needs assistance Equipment used: None Transfers: Sit to/from Stand Sit to Stand: Supervision         General transfer comment: Supervision for safety    Balance Overall balance assessment: Needs assistance Sitting-balance support: Bilateral upper extremity supported Sitting balance-Leahy Scale: Good     Standing balance support: Single extremity supported;During functional activity Standing balance-Leahy Scale: Fair                              ADL either performed or assessed with clinical judgement   ADL Overall ADL's : Needs assistance/impaired                                 Tub/ Shower Transfer: Tub transfer;Supervision/safety;3 in 1;Ambulation Tub/Shower Transfer Details (indicate cue type and reason): Educating pt on safe tub transfer techniques and demonstrates understanding wiht Supervision.  Functional mobility during ADLs: Minimal assistance(HHA) General ADL Comments: Educating pt on safe tub transfer, review theraband exericses, and provided handout and education on EC. Pt performing functional mobility in hallway with Min A and SpO2 93% on RA.      Vision       Perception     Praxis      Cognition Arousal/Alertness: Awake/alert Behavior During Therapy: WFL for tasks assessed/performed Overall Cognitive Status: Within Functional Limits for tasks assessed                                 General Comments: Very excited about dc later today        Exercises Exercises: Other exercises Other Exercises Other Exercises: Review exercises Other Exercises: Provided handout and education on EC and review in full. Pt very thanksful   Shoulder Instructions       General Comments SpO2 >93% on RA  Pertinent Vitals/ Pain       Pain Assessment: No/denies pain  Home Living                                          Prior Functioning/Environment              Frequency  Min 3X/week        Progress Toward Goals  OT Goals(current goals can now be found in the care plan section)  Progress towards OT goals: Progressing toward goals  Acute Rehab OT Goals Patient Stated Goal: to go home, do things again OT Goal Formulation: With patient Time For Goal Achievement: 06/13/19 Potential to Achieve Goals: Good ADL Goals Pt Will Perform Grooming: with min guard assist;standing Pt Will Perform Lower Body Dressing: with min  guard assist;sit to/from stand;sitting/lateral leans Pt Will Transfer to Toilet: with min guard assist;ambulating;regular height toilet Pt Will Perform Toileting - Clothing Manipulation and hygiene: with supervision;sitting/lateral leans;sit to/from stand Pt/caregiver will Perform Home Exercise Program: Increased strength;Both right and left upper extremity;With theraband;With written HEP provided;Independently Additional ADL Goal #1: Pt will independently verablize three energy conservation strategies for ADLs/IADLs  Plan Discharge plan remains appropriate    Co-evaluation                 AM-PAC OT "6 Clicks" Daily Activity     Outcome Measure   Help from another person eating meals?: None Help from another person taking care of personal grooming?: A Little Help from another person toileting, which includes using toliet, bedpan, or urinal?: A Little Help from another person bathing (including washing, rinsing, drying)?: A Little Help from another person to put on and taking off regular upper body clothing?: None Help from another person to put on and taking off regular lower body clothing?: A Little 6 Click Score: 20    End of Session    OT Visit Diagnosis: Unsteadiness on feet (R26.81);Other abnormalities of gait and mobility (R26.89);Muscle weakness (generalized) (M62.81)   Activity Tolerance Patient tolerated treatment well   Patient Left in chair;with call bell/phone within reach   Nurse Communication Mobility status        Time: 1351-1425 OT Time Calculation (min): 34 min  Charges: OT General Charges $OT Visit: 1 Visit OT Treatments $Self Care/Home Management : 8-22 mins $Therapeutic Activity: 8-22 mins  Danila Eddie MSOT, OTR/L Acute Rehab Pager: [phone] Office: [place] 06/04/2019, 5:08 PM

## 2019-06-04 NOTE — Discharge Summary (Signed)
[facility]  Physician Discharge Summary   Patient ID: Diana Roach MRN: [id number] DOB/AGE: 26-Mar-1951 68 y.o.  Admit date: 05/23/2019 Discharge date: 06/04/2019  PCP: Leeanne Rio, MD  DISCHARGE DIAGNOSES:  Pneumonia secondary to COVID-19 Acute respiratory failure with hypoxia due to COVID-19 Cardiac pause caused by hypoxemia Diabetes mellitus type 2    RECOMMENDATIONS FOR OUTPATIENT FOLLOW UP: 1. Defer management of diabetes in the outpatient setting once she is off of steroids. 2. Patient being discharged on oxygen 3. Home health   Home Health: PT and OT Equipment/Devices: Oxygen, walker  CODE STATUS: Full code  DISCHARGE CONDITION: fair  Diet recommendation: Modified carbohydrate  INITIAL HISTORY: 68 year old with a history of asthma and DM who was diagnosed with COVID-19 on July 23 and presented to the ED 7/29 with complaints of worsening shortness of breath. She was found to have a temperature of 102 and was saturating 80% on room air. A chest x-ray noted hazy bibasilar opacities. She originally presented to the ED on July 25th with shortness of breath but at that time was stable enough to be discharged home on prednisone.  Presented back on 7/29 and was hospitalized.  Had a CODE BLUE arrest on 8/1 with a four-point second pause.  Was given atropine and underwent CPR was transferred to the ICU.  Subsequently transferred back to PCU.  CT angiogram was negative for PE.   HOSPITAL COURSE:   Acute Hypoxic Resp. Failure due to Acute Covid 19 Viral Illness Patient was hospitalized.  Initially she had very high oxygen requirements.  She was on high flow nasal cannula at 12 L/min.  Patient was given Remdesivir and steroids.  She was given 1 dose of Actemra.  She slowly started improving.  She was also diuresed.  Her oxygen requirements have been improving on a daily basis.  She is now down to 2 L of oxygen by nasal cannula saturating in the 90s.   Inflammatory markers were also trended and have been improving.  Feels much better.  Seen by PT and OT.  Home health has been arranged.  She will also need home oxygen which is also been ordered.  Cardiac pause/bradycardia Patient developed significant bradycardia with a brief cardiac arrest on 8/1. She had a greater than 4-second pause. CODE BLUE was called.    She underwent CPR and was given atropine.  Transferred to the ICU.  Stabilized.  Did not have any recurrence.  She was transferred back to the floor. TSH normal.  Echocardiogram done recently in May showed normal systolic function.  Patient remained stable from a cardiac standpoint.  Occasionally bradycardic.  The episode thought to be triggered by significant hypoxemia.  Hyponatremia Possibly due to diuretics.    Resolved.  Diabetes mellitus type 2 HbA1c 7.4.    Elevated CBGs due to steroids.  As steroid is tapered down her CBG showed improved.  Will defer definitive management of this issue to outpatient providers.    History of asthma Stable.  Overall stable.  Okay for discharge home today.    PERTINENT LABS:  The results of significant diagnostics from this hospitalization (including imaging, microbiology, ancillary and laboratory) are listed below for reference.     Labs: Basic Metabolic Panel: Recent Labs  Lab 05/29/19 0140 05/31/19 0345 06/01/19 0130 06/02/19 0141 06/03/19 0114  NA 137 133* 133* 131* 135  K 4.1 4.5 3.8 4.2 4.0  CL 102 95* 92* 91* 97*  CO2 24 28 29 28 26   GLUCOSE 177*  175* 166* 197* 193*  BUN 23 28* 32* 32* 26*  CREATININE 0.86 0.90 0.95 0.86 0.75  CALCIUM 8.8* 9.1 8.9 8.9 8.8*   Liver Function Tests: Recent Labs  Lab 05/29/19 0140 05/31/19 0345 06/01/19 0130 06/02/19 0141  AST 28 24 20 20   ALT 29 32 30 29  ALKPHOS 60 60 59 52  BILITOT 0.4 1.0 1.0 0.6  PROT 5.9* 6.8 6.8 6.0*  ALBUMIN 3.0* 3.3* 3.5 3.1*   CBC: Recent Labs  Lab 05/29/19 0140 05/31/19 0345 06/01/19 0130  06/02/19 0141  WBC 10.0 15.6* 16.9* 13.4*  HGB 11.7* 12.5 12.5 11.9*  HCT 37.6 40.0 39.4 37.4  MCV 80.7 79.7* 79.0* 79.6*  PLT 498* 537* 529* 486*    CBG: Recent Labs  Lab 06/03/19 1202 06/03/19 1726 06/03/19 2058 06/04/19 0818 06/04/19 1106  GLUCAP 157* 153* 191* 117* 190*     IMAGING STUDIES Ct Angio Chest Pe W Or Wo Contrast  Result Date: 05/27/2019 CLINICAL DATA:  Shortness of breath EXAM: CT ANGIOGRAPHY CHEST WITH CONTRAST TECHNIQUE: Multidetector CT imaging of the chest was performed using the standard protocol during bolus administration of intravenous contrast. Multiplanar CT image reconstructions and MIPs were obtained to evaluate the vascular anatomy. CONTRAST:  118mL OMNIPAQUE IOHEXOL 350 MG/ML SOLN. Patient received premedication prior to this study. No adverse sequela occurred from contrast administration. COMPARISON:  Chest radiograph May 25, 2019 FINDINGS: Cardiovascular: There is no demonstrable pulmonary embolus. There is no thoracic aortic aneurysm or dissection. Visualized great vessels appear unremarkable. Note that the right innominate and left common carotid arteries arise as a common trunk, an anatomic variant. There is no pericardial effusion or pericardial thickening. There are foci of coronary artery calcification. Mediastinum/Nodes: Thyroid appears unremarkable. No evident thoracic adenopathy. No esophageal lesions are appreciable. Lungs/Pleura: There is widespread ground-glass type opacity throughout the lungs bilaterally. There is mild consolidation in the posterior segment right lower lobe. Minimal consolidationw is noted in the posterior left basew. No pleural effusions are evident. Upper Abdomen: There is hepatic steatosis. Visualized upper abdominal structures otherwise appear unremarkable. Musculoskeletal: There are no blastic or lytic bone lesions. No chest wall lesions evident. Review of the MIP images confirms the above findings. IMPRESSION: 1. No  demonstrable pulmonary embolus. No thoracic aortic aneurysm or dissection. There are foci of coronary artery calcification. 2. Multiple foci of ground-glass opacity throughout the lungs bilaterally, likely due to multifocal pneumonia, potentially from atypical organism source. Note mild consolidation in the lung bases posteriorly, somewhat more on the right than on the left. Superimposed bacterial pneumonia possible. Note that there may be a degree of underlying small airways obstructive disease given the somewhat mosaic appearance. 3.  No evident thoracic adenopathy. 4.  Hepatic steatosis. Electronically Signed   By: Lowella Grip III M.D.   On: 05/27/2019 17:06   Dg Chest Port 1 View  Result Date: 05/31/2019 CLINICAL DATA:  COVID-19 positive.  Shortness of breath. EXAM: PORTABLE CHEST 1 VIEW COMPARISON:  May 25, 2019 FINDINGS: The heart, hila, and mediastinum are unremarkable. No pneumothorax. Decreased pulmonary opacities. Minimal opacity remains in the lateral right upper lobe and the right base. More prominent opacity in the left base is similar with a somewhat streaky appearance. No other acute abnormalities. IMPRESSION: 1. Improving pulmonary infiltrates. Streaky opacity remains in the left base consistent with persistent infiltrate as it was not present on the May 19, 2019 study. No other acute abnormalities. Electronically Signed   By: Dorise Bullion III M.D  On: 05/31/2019 08:55   Dg Chest Port 1 View  Result Date: 05/25/2019 CLINICAL DATA:  COVID-19.  Pneumonia. EXAM: PORTABLE CHEST 1 VIEW COMPARISON:  05/23/2019. FINDINGS: Mediastinal prominence noted, this may represent prominent great vessels, follow-up PA lateral chest x-ray suggested. Borderline cardiomegaly. Progressive bilateral pulmonary interstitial prominence noted. Progressive interstitial edema and/or pneumonitis could present in this fashion left mid lung field subsegmental atelectasis. Follow-up to demonstrate clearing  suggested. No pleural effusion or pneumothorax. Degenerative change thoracic spine. IMPRESSION: 1. Mediastinal prominence noted, this may represent prominent great vessels, follow-up PA lateral chest x-ray suggested. 2.  Borderline cardiomegaly.  No pulmonary venous congestion. 3. Progressive bilateral pulmonary interstitial prominence noted most consistent with progressive pneumonitis given the patient's history of being COVID-19 positive. Interstitial edema could also present in this fashion. Left mid lung density again noted consistent with atelectasis, follow-up exam suggested to demonstrate resolution. Electronically Signed   By: Marcello Moores  Register   On: 05/25/2019 08:46   Dg Chest Port 1 View  Result Date: 05/23/2019 CLINICAL DATA:  COVID, shortness of breath. Additional history: Patient complains of shortness of breath, cough, central chest pain, mid back pain and nausea since July 18th. EXAM: PORTABLE CHEST 1 VIEW COMPARISON:  Chest radiograph 05/19/2019. FINDINGS: The heart is normal in size. Hazy opacities at the bilateral lung bases, as well as a new ill-defined opacity within the left lower lung. No evidence of pleural effusion or pneumothorax. No acute bony abnormality IMPRESSION: Hazy bibasilar opacities as well as new ill-defined opacity in the left lower lung. Given provided history, findings are suspicious for developing pneumonia. Electronically Signed   By: Kellie Simmering   On: 05/23/2019 10:41   Dg Chest Port 1 View  Result Date: 05/19/2019 CLINICAL DATA:  Shortness of breath EXAM: PORTABLE CHEST 1 VIEW COMPARISON:  June 16, 2018 FINDINGS: The heart size is stable. There are new subtle bilateral hazy airspace opacities. There is no pneumothorax. No large pleural effusion. No acute osseous abnormality. IMPRESSION: New subtle bilateral hazy airspace opacities which can be seen in patients with an atypical pneumonia. Electronically Signed   By: Constance Holster M.D.   On: 05/19/2019 20:22     DISCHARGE EXAMINATION: Vitals:   06/04/19 0100 06/04/19 0445 06/04/19 0446 06/04/19 0800  BP:   112/60 (!) 107/95  Pulse: (!) 54  60 (!) 56  Resp: 14 16    Temp:  98.4 F (36.9 C)  97.9 F (36.6 C)  TempSrc:  Oral  Oral  SpO2: 96%  94% 97%  Weight:      Height:       General appearance: Awake alert.  In no distress Resp: Clear to auscultation bilaterally.  Normal effort Cardio: S1-S2 is normal regular.  No S3-S4.  No rubs murmurs or bruit GI: Abdomen is soft.  Nontender nondistended.  Bowel sounds are present normal.  No masses organomegaly Extremities: No edema.  Full range of motion of lower extremities. Neurologic: Alert and oriented x3.  No focal neurological deficits.    DISPOSITION: Home  Discharge Instructions    Call MD for:  difficulty breathing, headache or visual disturbances   Complete by: As directed    Call MD for:  extreme fatigue   Complete by: As directed    Call MD for:  persistant dizziness or light-headedness   Complete by: As directed    Call MD for:  persistant nausea and vomiting   Complete by: As directed    Call MD for:  temperature >100.4  Complete by: As directed    Diet general   Complete by: As directed    Discharge instructions   Complete by: As directed    Please be sure to schedule a tele-visit with your primary care provider in 1 to 2 weeks.    COVID 19 INSTRUCTIONS  - You are felt to be stable enough to no longer require inpatient monitoring, testing, and treatment, though you will need to follow the recommendations below: - Based on the CDC's non-test criteria for ending self-isolation: You may not return to work/leave the home until at least 21 days since symptom onset AND 3 days without a fever (without taking tylenol, ibuprofen, etc.) AND have improvement in respiratory symptoms. - Do not take NSAID medications (including, but not limited to, ibuprofen, advil, motrin, naproxen, aleve, goody's powder, etc.) - Follow up with  your doctor in the next week via telehealth or seek medical attention right away if your symptoms get WORSE.  - Consider donating plasma after you have recovered (either 14 days after a negative test or 28 days after symptoms have completely resolved) because your antibodies to this virus may be helpful to give to others with life-threatening infections. Please go to the website www.[email or web address] if you would like to consider volunteering for plasma donation.    Directions for you at home:  Wear a facemask You should wear a facemask that covers your nose and mouth when you are in the same room with other people and when you visit a healthcare provider. People who live with or visit you should also wear a facemask while they are in the same room with you.  Separate yourself from other people in your home As much as possible, you should stay in a different room from other people in your home. Also, you should use a separate bathroom, if available.  Avoid sharing household items You should not share dishes, drinking glasses, cups, eating utensils, towels, bedding, or other items with other people in your home. After using these items, you should wash them thoroughly with soap and water.  Cover your coughs and sneezes Cover your mouth and nose with a tissue when you cough or sneeze, or you can cough or sneeze into your sleeve. Throw used tissues in a lined trash can, and immediately wash your hands with soap and water for at least 20 seconds or use an alcohol-based hand rub.  Wash your [facility] your hands often and thoroughly with soap and water for at least 20 seconds. You can use an alcohol-based hand sanitizer if soap and water are not available and if your hands are not visibly dirty. Avoid touching your eyes, nose, and mouth with unwashed hands.  Directions for those who live with, or provide care at home for you:  Limit the number of people who have contact with the patient If  possible, have only one caregiver for the patient. Other household members should stay in another home or place of residence. If this is not possible, they should stay in another room, or be separated from the patient as much as possible. Use a separate bathroom, if available. Restrict visitors who do not have an essential need to be in the home.  Ensure good ventilation Make sure that shared spaces in the home have good air flow, such as from an air conditioner or an opened window, weather permitting.  Wash your hands often Wash your hands often and thoroughly with soap and water for at least 20 seconds.  You can use an alcohol based hand sanitizer if soap and water are not available and if your hands are not visibly dirty. Avoid touching your eyes, nose, and mouth with unwashed hands. Use disposable paper towels to dry your hands. If not available, use dedicated cloth towels and replace them when they become wet.  Wear a facemask and gloves Wear a disposable facemask at all times in the room and gloves when you touch or have contact with the patients blood, body fluids, and/or secretions or excretions, such as sweat, saliva, sputum, nasal mucus, vomit, urine, or feces.  Ensure the mask fits over your nose and mouth tightly, and do not touch it during use. Throw out disposable facemasks and gloves after using them. Do not reuse. Wash your hands immediately after removing your facemask and gloves. If your personal clothing becomes contaminated, carefully remove clothing and launder. Wash your hands after handling contaminated clothing. Place all used disposable facemasks, gloves, and other waste in a lined container before disposing them with other household waste. Remove gloves and wash your hands immediately after handling these items.  Do not share dishes, glasses, or other household items with the patient Avoid sharing household items. You should not share dishes, drinking glasses, cups,  eating utensils, towels, bedding, or other items with a patient who is confirmed to have, or being evaluated for, COVID-19 infection. After the person uses these items, you should wash them thoroughly with soap and water.  Wash laundry thoroughly Immediately remove and wash clothes or bedding that have blood, body fluids, and/or secretions or excretions, such as sweat, saliva, sputum, nasal mucus, vomit, urine, or feces, on them. Wear gloves when handling laundry from the patient. Read and follow directions on labels of laundry or clothing items and detergent. In general, wash and dry with the warmest temperatures recommended on the label.  Clean all areas the individual has used often Clean all touchable surfaces, such as counters, tabletops, doorknobs, bathroom fixtures, toilets, phones, keyboards, tablets, and bedside tables, every day. Also, clean any surfaces that may have blood, body fluids, and/or secretions or excretions on them. Wear gloves when cleaning surfaces the patient has come in contact with. Use a diluted bleach solution (e.g., dilute bleach with 1 part bleach and 10 parts water) or a household disinfectant with a label that says EPA-registered for coronaviruses. To make a bleach solution at home, add 1 tablespoon of bleach to 1 quart (4 cups) of water. For a larger supply, add  cup of bleach to 1 gallon (16 cups) of water. Read labels of cleaning products and follow recommendations provided on product labels. Labels contain instructions for safe and effective use of the cleaning product including precautions you should take when applying the product, such as wearing gloves or eye protection and making sure you have good ventilation during use of the product. Remove gloves and wash hands immediately after cleaning.  Monitor yourself for signs and symptoms of illness Caregivers and household members are considered close contacts, should monitor their health, and will be asked to  limit movement outside of the home to the extent possible. Follow the monitoring steps for close contacts listed on the symptom monitoring form.   If you have additional questions, contact your local health department or call the epidemiologist on call at [phone] (available 24/7). This guidance is subject to change. For the most up-to-date guidance from [facility], please refer to their website: YouBlogs.pl   You were cared for by a hospitalist during your  hospital stay. If you have any questions about your discharge medications or the care you received while you were in the hospital after you are discharged, you can call the unit and asked to speak with the hospitalist on call if the hospitalist that took care of you is not available. Once you are discharged, your primary care physician will handle any further medical issues. Please note that NO REFILLS for any discharge medications will be authorized once you are discharged, as it is imperative that you return to your primary care physician (or establish a relationship with a primary care physician if you do not have one) for your aftercare needs so that they can reassess your need for medications and monitor your lab values. If you do not have a primary care physician, you can call [phone] for a physician referral.   Increase activity slowly   Complete by: As directed          Allergies as of 06/04/2019      Reactions   Codeine Itching, Hives   Minocycline Other (See Comments)   Dizziness , "strange feeling"   Contrast Media [iodinated Diagnostic Agents] Other (See Comments)   BP dropped   Levaquin [levofloxacin] Other (See Comments)   Hallucinations      Medication List    STOP taking these medications   ondansetron 4 MG disintegrating tablet Commonly known as: Zofran ODT   predniSONE 20 MG tablet Commonly known as: DELTASONE     TAKE these medications    acetaminophen 325 MG tablet Commonly known as: TYLENOL Take 650 mg by mouth every 6 (six) hours as needed for mild pain or headache.   albuterol 108 (90 Base) MCG/ACT inhaler Commonly known as: Proventil HFA Inhale 2 puffs into the lungs every 6 (six) hours as needed for wheezing or shortness of breath.   benzonatate 100 MG capsule Commonly known as: TESSALON Take 1 capsule (100 mg total) by mouth 3 (three) times daily as needed for cough.   dexamethasone 2 MG tablet Commonly known as: DECADRON Take 1 tablet (2 mg total) by mouth daily for 4 days.   fluticasone 110 MCG/ACT inhaler Commonly known as: FLOVENT HFA Inhale 1 puff into the lungs 2 (two) times daily.   fluticasone 50 MCG/ACT nasal spray Commonly known as: FLONASE Place 2 sprays into both nostrils daily.   guaiFENesin-dextromethorphan 100-10 MG/5ML syrup Commonly known as: ROBITUSSIN DM Take 5 mLs by mouth every 4 (four) hours as needed for cough (chest congestion).   loratadine 10 MG tablet Commonly known as: CLARITIN Take 1 tablet (10 mg total) by mouth daily for 10 days.   pantoprazole 40 MG tablet Commonly known as: PROTONIX Take 1 tablet (40 mg total) by mouth daily for 14 days.   polyethylene glycol 17 g packet Commonly known as: MIRALAX / GLYCOLAX Take 17 g by mouth daily.            Durable Medical Equipment  (From admission, onward)         Start     Ordered   06/04/19 0855  For home use only DME Walker rolling  Once    Question:  Patient needs a walker to treat with the following condition  Answer:  Physical deconditioning   06/04/19 0855   06/04/19 0855  For home use only DME 3 n 1  Once     06/04/19 0855   06/04/19 0854  For home use only DME oxygen  Once    Question Answer  Comment  Length of Need 6 Months   Mode or (Route) Nasal cannula   Liters per Minute 2   Frequency Continuous (stationary and portable oxygen unit needed)   Oxygen conserving device Yes   Oxygen delivery system  Gas      06/04/19 0855           Follow-up Information    Leeanne Rio, MD. Schedule an appointment as soon as possible for a visit in 1 week(s).   Specialty: Family Medicine Contact information: [place] [employer] [place] [address] [place], [facility] Follow up.   Specialty: Home Health Services Why: Physical therapy/ Occaptional therapy arranged through [facility]- please follow up with any questions/ if you do not hear from [place] information: [place] [address] [phone]           TOTAL DISCHARGE TIME: 35 minutes  [place]  [facility] Pager on [email or web address]  06/04/2019, 2:11 PM

## 2019-06-04 NOTE — Progress Notes (Signed)
Reviewed all d/c instructions and patient verbalized understanding.  Copy given.  Reviewed [place] Covid form with patient and she signed.  Awaiting home oxygen set up and family family for d/c.

## 2019-06-04 NOTE — Discharge Instructions (Signed)
COVID-19 °COVID-19 is a respiratory infection that is caused by a virus called severe acute respiratory syndrome coronavirus 2 (SARS-CoV-2). The disease is also known as coronavirus disease or novel coronavirus. In some people, the virus may not cause any symptoms. In others, it may cause a serious infection. The infection can get worse quickly and can lead to complications, such as: °· Pneumonia, or infection of the lungs. °· Acute respiratory distress syndrome or ARDS. This is fluid build-up in the lungs. °· Acute respiratory failure. This is a condition in which there is not enough oxygen passing from the lungs to the body. °· Sepsis or septic shock. This is a serious bodily reaction to an infection. °· Blood clotting problems. °· Secondary infections due to bacteria or fungus. °The virus that causes COVID-19 is contagious. This means that it can spread from person to person through droplets from coughs and sneezes (respiratory secretions). °What are the causes? °This illness is caused by a virus. You may catch the virus by: °· Breathing in droplets from an infected person's cough or sneeze. °· Touching something, like a table or a doorknob, that was exposed to the virus (contaminated) and then touching your mouth, nose, or eyes. °What increases the risk? °Risk for infection °You are more likely to be infected with this virus if you: °· Live in or travel to an area with a COVID-19 outbreak. °· Come in contact with a sick person who recently traveled to an area with a COVID-19 outbreak. °· Provide care for or live with a person who is infected with COVID-19. °Risk for serious illness °You are more likely to become seriously ill from the virus if you: °· Are 65 years of age or older. °· Have a long-term disease that lowers your body's ability to fight infection (immunocompromised). °· Live in a nursing home or long-term care facility. °· Have a long-term (chronic) disease such as: °? Chronic lung disease, including  chronic obstructive pulmonary disease or asthma °? Heart disease. °? Diabetes. °? Chronic kidney disease. °? Liver disease. °· Are obese. °What are the signs or symptoms? °Symptoms of this condition can range from mild to severe. Symptoms may appear any time from 2 to 14 days after being exposed to the virus. They include: °· A fever. °· A cough. °· Difficulty breathing. °· Chills. °· Muscle pains. °· A sore throat. °· Loss of taste or smell. °Some people may also have stomach problems, such as nausea, vomiting, or diarrhea. °Other people may not have any symptoms of COVID-19. °How is this diagnosed? °This condition may be diagnosed based on: °· Your signs and symptoms, especially if: °? You live in an area with a COVID-19 outbreak. °? You recently traveled to or from an area where the virus is common. °? You provide care for or live with a person who was diagnosed with COVID-19. °· A physical exam. °· Lab tests, which may include: °? A nasal swab to take a sample of fluid from your nose. °? A throat swab to take a sample of fluid from your throat. °? A sample of mucus from your lungs (sputum). °? Blood tests. °· Imaging tests, which may include, X-rays, CT scan, or ultrasound. °How is this treated? °At present, there is no medicine to treat COVID-19. Medicines that treat other diseases are being used on a trial basis to see if they are effective against COVID-19. °Your health care provider will talk with you about ways to treat your symptoms. For most   people, the infection is mild and can be managed at home with rest, fluids, and over-the-counter medicines. °Treatment for a serious infection usually takes places in a hospital intensive care unit (ICU). It may include one or more of the following treatments. These treatments are given until your symptoms improve. °· Receiving fluids and medicines through an IV. °· Supplemental oxygen. Extra oxygen is given through a tube in the nose, a face mask, or a  hood. °· Positioning you to lie on your stomach (prone position). This makes it easier for oxygen to get into the lungs. °· Continuous positive airway pressure (CPAP) or bi-level positive airway pressure (BPAP) machine. This treatment uses mild air pressure to keep the airways open. A tube that is connected to a motor delivers oxygen to the body. °· Ventilator. This treatment moves air into and out of the lungs by using a tube that is placed in your windpipe. °· Tracheostomy. This is a procedure to create a hole in the neck so that a breathing tube can be inserted. °· Extracorporeal membrane oxygenation (ECMO). This procedure gives the lungs a chance to recover by taking over the functions of the heart and lungs. It supplies oxygen to the body and removes carbon dioxide. °Follow these instructions at home: °Lifestyle °· If you are sick, stay home except to get medical care. Your health care provider will tell you how long to stay home. Call your health care provider before you go for medical care. °· Rest at home as told by your health care provider. °· Do not use any products that contain nicotine or tobacco, such as cigarettes, e-cigarettes, and chewing tobacco. If you need help quitting, ask your health care provider. °· Return to your normal activities as told by your health care provider. Ask your health care provider what activities are safe for you. °General instructions °· Take over-the-counter and prescription medicines only as told by your health care provider. °· Drink enough fluid to keep your urine pale yellow. °· Keep all follow-up visits as told by your health care provider. This is important. °How is this prevented? ° °There is no vaccine to help prevent COVID-19 infection. However, there are steps you can take to protect yourself and others from this virus. °To protect yourself:  °· Do not travel to areas where COVID-19 is a risk. The areas where COVID-19 is reported change often. To identify  high-risk areas and travel restrictions, check the CDC travel website: wwwnc.cdc.gov/travel/notices °· If you live in, or must travel to, an area where COVID-19 is a risk, take precautions to avoid infection. °? Stay away from people who are sick. °? Wash your hands often with soap and water for 20 seconds. If soap and water are not available, use an alcohol-based hand sanitizer. °? Avoid touching your mouth, face, eyes, or nose. °? Avoid going out in public, follow guidance from your state and local health authorities. °? If you must go out in public, wear a cloth face covering or face mask. °? Disinfect objects and surfaces that are frequently touched every day. This may include: °§ Counters and tables. °§ Doorknobs and light switches. °§ Sinks and faucets. °§ Electronics, such as phones, remote controls, keyboards, computers, and tablets. °To protect others: °If you have symptoms of COVID-19, take steps to prevent the virus from spreading to others. °· If you think you have a COVID-19 infection, contact your health care provider right away. Tell your health care team that you think you may   have a COVID-19 infection. °· Stay home. Leave your house only to seek medical care. Do not use public transport. °· Do not travel while you are sick. °· Wash your hands often with soap and water for 20 seconds. If soap and water are not available, use alcohol-based hand sanitizer. °· Stay away from other members of your household. Let healthy household members care for children and pets, if possible. If you have to care for children or pets, wash your hands often and wear a mask. If possible, stay in your own room, separate from others. Use a different bathroom. °· Make sure that all people in your household wash their hands well and often. °· Cough or sneeze into a tissue or your sleeve or elbow. Do not cough or sneeze into your hand or into the air. °· Wear a cloth face covering or face mask. °Where to find more  information °· Centers for Disease Control and Prevention: www.cdc.gov/coronavirus/2019-ncov/index.html °· World Health Organization: www.who.int/health-topics/coronavirus °Contact a health care provider if: °· You live in or have traveled to an area where COVID-19 is a risk and you have symptoms of the infection. °· You have had contact with someone who has COVID-19 and you have symptoms of the infection. °Get help right away if: °· You have trouble breathing. °· You have pain or pressure in your chest. °· You have confusion. °· You have bluish lips and fingernails. °· You have difficulty waking from sleep. °· You have symptoms that get worse. °These symptoms may represent a serious problem that is an emergency. Do not wait to see if the symptoms will go away. Get medical help right away. Call your local emergency services (911 in the [place] Do not drive yourself to the hospital. Let the emergency medical personnel know if you think you have COVID-19. °Summary °· COVID-19 is a respiratory infection that is caused by a virus. It is also known as coronavirus disease or novel coronavirus. It can cause serious infections, such as pneumonia, acute respiratory distress syndrome, acute respiratory failure, or sepsis. °· The virus that causes COVID-19 is contagious. This means that it can spread from person to person through droplets from coughs and sneezes. °· You are more likely to develop a serious illness if you are 65 years of age or older, have a weak immunity, live in a nursing home, or have chronic disease. °· There is no medicine to treat COVID-19. Your health care provider will talk with you about ways to treat your symptoms. °· Take steps to protect yourself and others from infection. Wash your hands often and disinfect objects and surfaces that are frequently touched every day. Stay away from people who are sick and wear a mask if you are sick. °This information is not intended to replace advice given to you by  your health care provider. Make sure you discuss any questions you have with your health care provider. °Document Released: 11/16/2018 Document Revised: 03/08/2019 Document Reviewed: 11/16/2018 °Elsevier Patient Education © 2020 Elsevier Inc. ° °COVID-19: How to Protect Yourself and Others °Know how it spreads °· There is currently no vaccine to prevent coronavirus disease 2019 (COVID-19). °· The best way to prevent illness is to avoid being exposed to this virus. °· The virus is thought to spread mainly from person-to-person. °? Between people who are in close contact with one another (within about 6 feet). °? Through respiratory droplets produced when an infected person coughs, sneezes or talks. °? These droplets can land in   the mouths or noses of people who are nearby or possibly be inhaled into the lungs. °? Some recent studies have suggested that COVID-19 may be spread by people who are not showing symptoms. °Everyone should °Clean your hands often °· Wash your hands often with soap and water for at least 20 seconds especially after you have been in a public place, or after blowing your nose, coughing, or sneezing. °· If soap and water are not readily available, use a hand sanitizer that contains at least 60% alcohol. Cover all surfaces of your hands and rub them together until they feel dry. °· Avoid touching your eyes, nose, and mouth with unwashed hands. °Avoid close contact °· Stay home if you are sick. °· Avoid close contact with people who are sick. °· Put distance between yourself and other people. °? Remember that some people without symptoms may be able to spread virus. °? This is especially important for people who are at higher risk of getting very sick.www.cdc.gov/coronavirus/2019-ncov/need-extra-precautions/people-at-higher-risk.html °Cover your mouth and nose with a cloth face cover when around others °· You could spread COVID-19 to others even if you do not feel sick. °· Everyone should wear a  cloth face cover when they have to go out in public, for example to the grocery store or to pick up other necessities. °? Cloth face coverings should not be placed on young children under age 2, anyone who has trouble breathing, or is unconscious, incapacitated or otherwise unable to remove the mask without assistance. °· The cloth face cover is meant to protect other people in case you are infected. °· Do NOT use a facemask meant for a healthcare worker. °· Continue to keep about 6 feet between yourself and others. The cloth face cover is not a substitute for social distancing. °Cover coughs and sneezes °· If you are in a private setting and do not have on your cloth face covering, remember to always cover your mouth and nose with a tissue when you cough or sneeze or use the inside of your elbow. °· Throw used tissues in the trash. °· Immediately wash your hands with soap and water for at least 20 seconds. If soap and water are not readily available, clean your hands with a hand sanitizer that contains at least 60% alcohol. °Clean and disinfect °· Clean AND disinfect frequently touched surfaces daily. This includes tables, doorknobs, light switches, countertops, handles, desks, phones, keyboards, toilets, faucets, and sinks. www.cdc.gov/coronavirus/2019-ncov/prevent-getting-sick/disinfecting-your-home.html °· If surfaces are dirty, clean them: Use detergent or soap and water prior to disinfection. °· Then, use a household disinfectant. You can see a list of EPA-registered household disinfectants here. °cdc.gov/coronavirus °02/27/2019 °This information is not intended to replace advice given to you by your health care provider. Make sure you discuss any questions you have with your health care provider. °Document Released: 02/06/2019 Document Revised: 03/07/2019 Document Reviewed: 02/06/2019 °Elsevier Patient Education © 2020 Elsevier Inc. ° °

## 2019-06-04 NOTE — Progress Notes (Signed)
Patient ambulated in hallway on room air for approximately 100 feet.  She did require one sitting break due to legs being tired but otherwise tolerated well.  She did require contact guard assist for safety.  Oxygen levels stayed greater than 92% on room air.  Earleen Reaper RN

## 2019-06-04 NOTE — TOC Transition Note (Addendum)
Transition of Care [facility]) - CM/SW Discharge Note   Patient Details  Name: ASAL TEAS MRN: [id number] Date of Birth: 01/27/1951  [facility] CM/SW Contact:  Weston Anna, LCSW Phone Number: 06/04/2019, 12:04 PM   Clinical Narrative:     UPDATE: [facility] is not able to accept patient at this time. [facility] has accepted referral   CSW spoke with patient via phone regarding discharge plans. Patient has orders for [facility]( PT/OT), oxygen, 3n1, and walker. Referral completed for all DME needs with [facility]. Oxygen, 3n1, and walker should be delivered to patients room before discharge.   Patient prefers [facility] for [facility] needs however has not received confirmation they are able to accept patient. If not, CSW will locate new company to provide [facility].    Final next level of care: Home w Home Health Services Barriers to Discharge: No Barriers Identified   Patient Goals and CMS Choice   CMS Medicare.gov Compare Post Acute Care list provided to:: Patient Choice offered to / list presented to : Patient  Discharge Placement                       Discharge Plan and Services In-house Referral: Clinical Social Work Discharge Planning Services: NA Post Acute Care Choice: Durable Medical Equipment, Home Health          DME Arranged: 3-N-1, Oxygen, Walker DME Agency: [facility] Date DME Agency Contacted: 06/04/19 Time DME Agency Contacted: 1203 Representative spoke with at DME Agency: Magda Paganini HH Arranged: PT, OT [facility] Agency: (waiting to see if referral is accepted)        Social Determinants of Health (SDOH) Interventions     Readmission Risk Interventions No flowsheet data found.

## 2019-06-14 MED FILL — Medication: Qty: 1 | Status: AC

## 2019-06-19 ENCOUNTER — Other Ambulatory Visit: Payer: Self-pay

## 2019-06-19 ENCOUNTER — Telehealth (INDEPENDENT_AMBULATORY_CARE_PROVIDER_SITE_OTHER): Payer: PPO | Admitting: Family Medicine

## 2019-06-19 DIAGNOSIS — K219 Gastro-esophageal reflux disease without esophagitis: Secondary | ICD-10-CM | POA: Insufficient documentation

## 2019-06-19 DIAGNOSIS — J9611 Chronic respiratory failure with hypoxia: Secondary | ICD-10-CM | POA: Insufficient documentation

## 2019-06-19 DIAGNOSIS — J4540 Moderate persistent asthma, uncomplicated: Secondary | ICD-10-CM

## 2019-06-19 DIAGNOSIS — Z09 Encounter for follow-up examination after completed treatment for conditions other than malignant neoplasm: Secondary | ICD-10-CM | POA: Diagnosis not present

## 2019-06-19 MED ORDER — LORATADINE 10 MG PO TABS: 10.0000 mg | ORAL_TABLET | Freq: Every day | ORAL | 1 refills | Status: DC

## 2019-06-19 MED ORDER — PANTOPRAZOLE SODIUM 40 MG PO TBEC: 40.0000 mg | DELAYED_RELEASE_TABLET | Freq: Every day | ORAL | 1 refills | Status: DC

## 2019-06-19 NOTE — Assessment & Plan Note (Addendum)
Continues to require oxygen. Advised continuing to wear oxygen 2L while she is ambulating. She will continue monitoring sats at home. Follow up via virtual visit in 1 month, scheduled. Refill claritin as she thinks this was helping.

## 2019-06-19 NOTE — Assessment & Plan Note (Signed)
Refill protonix, having reflux symptoms since this was stopped

## 2019-06-19 NOTE — Assessment & Plan Note (Signed)
Continue flovent, advised to use albuterol as needed - may help her feel less shortness of breath.

## 2019-06-19 NOTE — Progress Notes (Signed)
[place] Telemedicine Visit  Patient consented to have virtual visit. Method of visit: Telephone  Encounter participants: Patient: Diana Roach - located at home Provider: Chrisandra Netters - located at home Others (if applicable): n/a  Chief Complaint: hospital follow up   HPI:  Diana Roach presents for hospital follow up. Patient was hospitalized from 05/23/19 to 06/04/19 with acute respiratory failure from COVID-19.   Patient now reports she is doing better than when in the hospital. Lives with daughter and grandson, as well as son-in-law. Other family members are healthy. Daughter and son-in-law never got COVID.   While ambulating oxygen gets down to 86-88%. If she stops and rests it comes back up to 90-93%. She has not worn oxygen at home in the past two days, has put on just if she is exerting and feels tired. The lowest oxygen has gotten is 81%. If she wears oxygen 2L sats stay above 93% even while exerting.  Coughs up some phlegm sometimes. Occasionally white, sometimes yellow. No fevers since discharge. The past 2 days has had some diarrhea. Had it four times yesterday, about 3-4 times today. No blood. The diarrhea is overall getting better. Is also having some reflux symptoms at night when she lays down to sleep.  Finished steroids 4 days ago. Has a good appetite. Has not needed albuterol since she's been home. Does take flovent twice daily.   Canceled her eye surgery for cataracts twice. Will need a negative COVID test before she can reschedule it.  ROS: per HPI  Pertinent PMHx: history of hyperlipidemia, asthma, type 2 diabetes   Exam:  Respiratory: Patient speaking normally in full sentences throughout the encounter, without any respiratory distress evident.   Assessment/Plan:  Chronic respiratory failure with hypoxia (HCC) Continues to require oxygen. Advised continuing to wear oxygen 2L while she is ambulating. She will continue  monitoring sats at home. Follow up via virtual visit in 1 month, scheduled. Refill claritin as she thinks this was helping.  Asthma, moderate persistent Continue flovent, advised to use albuterol as needed - may help her feel less shortness of breath.   GERD (gastroesophageal reflux disease) Refill protonix, having reflux symptoms since this was stopped   Advised continuing oxygen while exerting Refill claritin  Time spent during visit with patient: 20 minutes

## 2019-07-05 DIAGNOSIS — U071 COVID-19: Secondary | ICD-10-CM | POA: Diagnosis not present

## 2019-07-16 ENCOUNTER — Other Ambulatory Visit: Payer: Self-pay

## 2019-07-16 ENCOUNTER — Telehealth (INDEPENDENT_AMBULATORY_CARE_PROVIDER_SITE_OTHER): Payer: PPO | Admitting: Family Medicine

## 2019-07-16 DIAGNOSIS — J9611 Chronic respiratory failure with hypoxia: Secondary | ICD-10-CM | POA: Diagnosis not present

## 2019-07-16 DIAGNOSIS — U071 COVID-19: Secondary | ICD-10-CM

## 2019-07-16 DIAGNOSIS — E119 Type 2 diabetes mellitus without complications: Secondary | ICD-10-CM

## 2019-07-16 NOTE — Progress Notes (Signed)
[place] Telemedicine Visit  Patient consented to have virtual visit. Method of visit: Telephone  Encounter participants: Patient: Diana Roach - located at home Provider: Chrisandra Netters - located at office Others (if applicable): n/a  Chief Complaint: follow up  HPI:  Chronic respiratory failure - Has not worn any oxygen in the last 4 days. Checks pulse ox and it is usually 91-94% on room air. When she exerts it will briefly drop down to 88 or 89% on occasion, but pops right back up to 91-94%. Today was the first time she saw a 96% reading. Some shortness of breath with exertion but overall improving.  Upcoming cataract surgery - had planned to have cataract surgery before she got COVID. Needs another COVID test to prove she is neg before the eye doctor will see her again  Diabetes - not on any medications for diabetes. Will be due for A1c at the end of October.  ROS: per HPI  Pertinent PMHx: history of hyperlipidemia, lichen planus, asthma, type 2 diabetes, chronic resp failure due to COVID   Exam:  Respiratory: Patient speaking normally in full sentences throughout the encounter, without any respiratory distress evident.   Assessment/Plan:  Chronic respiratory failure with hypoxia (HCC) Doing well now off oxygen for 4 days. Patient will continue to monitor sats at home. She will keep oxygen on hand at home in case she needs it. When I see her at the end of October we will plan to do a walk test in clinic. Visit scheduled.  Type 2 diabetes mellitus without complication, without long-term current use of insulin (HCC) Not on any medications at present. Plan to check A1c at upcoming visit at end of Oct. Advised to come fasting as she will be due for lipids then.    Cataracts Planning for surgery, needs COVID testing done to prove she has recovered. Patient denies any recurrence or worsening of symptoms to raise concern for reinfection. [id number] for  COVID testing placed.  Patient advised to go to [facility] for driveup testing. Patient advised of hours 8am-4pm and that they should be in line by 3 PM.  Visit scheduled to get flu shot next Tuesday.  Time spent during visit with patient: 13 minutes

## 2019-07-16 NOTE — Assessment & Plan Note (Signed)
Not on any medications at present. Plan to check A1c at upcoming visit at end of Oct. Advised to come fasting as she will be due for lipids then.

## 2019-07-16 NOTE — Assessment & Plan Note (Signed)
Doing well now off oxygen for 4 days. Patient will continue to monitor sats at home. She will keep oxygen on hand at home in case she needs it. When I see her at the end of October we will plan to do a walk test in clinic. Visit scheduled.

## 2019-07-17 ENCOUNTER — Other Ambulatory Visit: Payer: Self-pay

## 2019-07-17 DIAGNOSIS — Z20822 Contact with and (suspected) exposure to covid-19: Secondary | ICD-10-CM

## 2019-07-17 DIAGNOSIS — R6889 Other general symptoms and signs: Secondary | ICD-10-CM | POA: Diagnosis not present

## 2019-07-19 ENCOUNTER — Telehealth: Payer: Self-pay

## 2019-07-19 LAB — NOVEL CORONAVIRUS, NAA: SARS-CoV-2, NAA: NOT DETECTED

## 2019-07-19 NOTE — Telephone Encounter (Signed)
Patient informed of negative results.  Diana Roach, Diana Roach

## 2019-07-24 ENCOUNTER — Other Ambulatory Visit: Payer: Self-pay

## 2019-07-24 ENCOUNTER — Ambulatory Visit (INDEPENDENT_AMBULATORY_CARE_PROVIDER_SITE_OTHER): Payer: PPO

## 2019-07-24 DIAGNOSIS — Z23 Encounter for immunization: Secondary | ICD-10-CM

## 2019-07-24 NOTE — Progress Notes (Signed)
Patient presents in nurse clinic for Flu vaccine. Vaccine given, LD. Patient tolerated well. 

## 2019-07-25 DIAGNOSIS — H25813 Combined forms of age-related cataract, bilateral: Secondary | ICD-10-CM | POA: Diagnosis not present

## 2019-07-25 DIAGNOSIS — H40033 Anatomical narrow angle, bilateral: Secondary | ICD-10-CM | POA: Diagnosis not present

## 2019-07-25 DIAGNOSIS — H04123 Dry eye syndrome of bilateral lacrimal glands: Secondary | ICD-10-CM | POA: Diagnosis not present

## 2019-08-04 DIAGNOSIS — J4552 Severe persistent asthma with status asthmaticus: Secondary | ICD-10-CM | POA: Diagnosis not present

## 2019-08-04 DIAGNOSIS — U071 COVID-19: Secondary | ICD-10-CM | POA: Diagnosis not present

## 2019-08-12 NOTE — Progress Notes (Signed)
Entered chart for [employer].

## 2019-08-21 ENCOUNTER — Ambulatory Visit (INDEPENDENT_AMBULATORY_CARE_PROVIDER_SITE_OTHER): Payer: PPO | Admitting: Family Medicine

## 2019-08-21 ENCOUNTER — Other Ambulatory Visit: Payer: Self-pay

## 2019-08-21 ENCOUNTER — Telehealth: Payer: Self-pay | Admitting: Family Medicine

## 2019-08-21 VITALS — BP 125/80 | HR 81 | Wt 148.2 lb

## 2019-08-21 DIAGNOSIS — M8580 Other specified disorders of bone density and structure, unspecified site: Secondary | ICD-10-CM

## 2019-08-21 DIAGNOSIS — E119 Type 2 diabetes mellitus without complications: Secondary | ICD-10-CM | POA: Diagnosis not present

## 2019-08-21 DIAGNOSIS — J9611 Chronic respiratory failure with hypoxia: Secondary | ICD-10-CM | POA: Diagnosis not present

## 2019-08-21 DIAGNOSIS — R52 Pain, unspecified: Secondary | ICD-10-CM

## 2019-08-21 DIAGNOSIS — L659 Nonscarring hair loss, unspecified: Secondary | ICD-10-CM | POA: Diagnosis not present

## 2019-08-21 DIAGNOSIS — M2550 Pain in unspecified joint: Secondary | ICD-10-CM

## 2019-08-21 HISTORY — DX: Pain, unspecified: R52

## 2019-08-21 LAB — POCT GLYCOSYLATED HEMOGLOBIN (HGB A1C): HbA1c, POC (controlled diabetic range): 6.7 % (ref 0.0–7.0)

## 2019-08-21 NOTE — Assessment & Plan Note (Addendum)
Continuing to improve. Patient reports reduced dyspnea on exertion. She has been off supplemental oxygen since mid-September. Reports O2 sats of 96% at home. O2 sat while in clinic today was 96% at rest and dipped to 94% on walk test. Can stop supplemental oxygen.

## 2019-08-21 NOTE — Progress Notes (Signed)
Subjective  CC:  Hair Loss, Joint Pain, Respiratory Failure   STM:HDQQI E Pounders is a 68 y.o. female who presents today with the following problems:  HAIR LOSS Patient has experienced diffuse hair loss for the past month and states that "clumps" of hair fall out while combing. She has a history of lichen planus with associated hair loss but reports current loss is worse than usual. She states that her scalp has also been sore with intermittent raised lesions during this time. No changes to hair products noted (she has used dove and herbal solutions for several years). Patient was hospitalized for Covid-19 in late July and placed on a number of medications, including steroids for 2 weeks. She notes weight gain, cold intolerance, and a history of constipation. Denies night sweats, rash, and fatigue. She was last seen by her dermatologist in 2019 and advised to apply Rogaine if hair loss worsens. Patient has not used Rogaine and expressed interest in being referred to a different dermatologist.      JOINT PAIN Patient reports bilateral, dull/achy groin pain that radiates to both knees for 1.5 months. Pain is worse on the left-side and has slightly worsened since onset. States that pain is aggravated by flexing hips in a seated position and is worse in the morning. She notes that pain improves with activity and use of goody powder. Patient also reports bilateral achy joint pain of the hands (localized at MCP) and feet (localized at hallux). Muscle spasms of bilateral biceps noted. Denies trauma, fever, weight loss, abdominal pain, changes in urinary frequency, dysuria.   CHRONIC RESPIRATORY FAILURE Patient was hospitalized from 05/23/19 to 06/04/19 with acute respiratory failure secondary to Covid-19. Following discharge from the hospital, she initially required 2L of supplemental oxygen by nasal cannula at home. At that time, patient reported O2 sats of 90-93% (dropping to 86-88% with exertion). She was  seen via telehealth on 07/16/19 and reported coming off supplemental oxygen in mid-September with O2 sats on room air at 91-94%. She reported mild shortness of breath with exertion at that time. During today's encounter, she reports continued improvement with O2 sats on room air at 96%. She notes an improvement of her dyspnea with exertion. O2 sat in clinic was 96%, dipping to 94% with exertion. Denies chest pain, chest tightness, cough, and chills.     DIABETES Patient has a history of diet-controlled diabetes. Previous A1c collected on 05/24/19 was 7.4. Does not measure blood glucose levels at home. She reports vision changes secondary to cataracts and has scheduled cataract surgery on Nov. 6th to address this. Denies polyuria, sensory changes.   ROS: Pertinent ROS included in HPI.  Pertinent P/F/SHx:   History of rheumatoid arthritis in mother, sister, and grandmother. Sister has also been diagnosed with fibromyalgia. Former smoker.   Objective  Physical Exam:  BP 125/80   Pulse 81   Wt 148 lb 3.2 oz (67.2 kg)   SpO2 96%   BMI 28.00 kg/m  Gen: NAD, resting comfortably HEENT: mild scalp tenderness on palpation; NCAT, supple neck CV: RRR, no murmurs  Pulm: CTAB, NWOB GI: soft, nontender to palpation  MSK: faint ulnar deviation of bilateral MCPs; bilateral PIP swelling; mild knee pain on joint line palpation, bilaterally; no lower extremity edema, cyanosis, or clubbing noted Skin: warm, dry Neuro: grossly normal, moves all extremities Psych: normal affect and thought content   Assessment & Plan    Hair loss Patient with a history of hair loss reports worsening, diffuse loss in  the past month. Notes scalp soreness with intermittent raised lesions during this time. PE is remarkable for scalp pain on palpation. Given her recent hospitalization for acute respiratory failure secondary to Covid, patient's hair loss is most likely secondary to body's physiologic response to recent stressor  (acute telogen effluvium). Recent corticosteroid use, timeline of symptoms, and diffuse/nonscarring nature of hair loss supports this diagnosis. Differential also includes thyroid dysfunction and anemia but recent normal CBC (06/02/19) and TSH (05/26/19) make these unlikely. Lichen planus flare-up is an additional etiology that cannot be ruled out but is less likely in the absence of rash, additional cutaneous findings.  - Will place referral for new dermatologist as requested by patient  - Continue to monitor hair loss at upcoming visit and follow-up on dermatology referral  Chronic respiratory failure with hypoxia ([place]) Continuing to improve. Patient reports reduced dyspnea on exertion. She has been off supplemental oxygen since mid-September. Reports O2 sats of 96% at home. O2 sat while in clinic today was 96% at rest and dipped to 94% on walk test. Can stop supplemental oxygen.  Type 2 diabetes mellitus without complication, without long-term current use of insulin ([facility]) Well-controlled with diet. A1c trending down with measurement of 6.7 during today's encounter. - Advised to continue current diet and resume moderate walking exercise as tolerated  - Lipid panel ordered to assess risk for cardiovascular disease and need for statin therapy, will follow-up on results  - CMP14+EGFR ordered to assess renal function, will follow-up on results   Generalized pain Patient reports dull, achy pain of bilateral groin for the past 1.5 months that is worse on the left-side and radiates to both knees. Also reports achy pain of hands and feet, bilaterally. PE is remarkable for ulnar deviation of MCPs. Given symmetric, generalized nature of patient's joint pain that improves with activity, some concern for rheumatoid arthritis. Family history significant for RA and physical exam findings support this diagnosis. Osteoarthritis also considered but less likely given symmetric distribution of pain, improvement with  exertion, and only mild knee pain on joint line palpation. Additional items on differential include fibromyalgia and long-term sequelae of recent Covid infection. - Rheumatoid factor assessment ordered, will follow-up on results  - Referral for rheumatology placed during today's encounters - Will monitor symptoms at next visit and follow-up on referral    Health Maintenance Patient due for routine DEXA scan to assess bone density. Previous scan conducted on 06/2017 indicated osteopenia.  - Will place referral for bone density and follow-up with patient on appointment details     Follow-up: Schedule in 3 months  Gwynneth Albright, Medical Student  Patient seen along with MS3 student Gwynneth Albright. I personally evaluated this patient along with the student, and verified all aspects of the history, physical exam, and medical decision making as documented by the student. I agree with the student's documentation and have made all necessary edits.  Chrisandra Netters, MD [place]

## 2019-08-21 NOTE — Patient Instructions (Addendum)
It was great to see you again today!  Referring to rheematologist and dermatologist  Checking labs today Follow up with me in 3 months for diabetes Will call with appointment for bone density test  Call with any questions  Be well, [name]

## 2019-08-21 NOTE — Assessment & Plan Note (Addendum)
Patient reports dull, achy pain of bilateral groin for the past 1.5 months that is worse on the left-side and radiates to both knees. Also reports achy pain of hands and feet, bilaterally. PE is remarkable for ulnar deviation of MCPs. Given symmetric, generalized nature of patient's joint pain that improves with activity, some concern for rheumatoid arthritis. Family history significant for RA and physical exam findings support this diagnosis. Osteoarthritis also considered but less likely given symmetric distribution of pain, improvement with exertion, and only mild knee pain on joint line palpation. Additional items on differential include fibromyalgia and long-term sequelae of recent Covid infection. - Rheumatoid factor assessment ordered, will follow-up on results  - Referral for rheumatology placed during today's encounters - Will monitor symptoms at next visit and follow-up on referral

## 2019-08-21 NOTE — Telephone Encounter (Signed)
Patient said she was told by Ardelia Mems today that she did not need the oxygen anymore and for the patient to have the company come pick it back up. [employer] will not pick the oxygen back up until [facility] faxes them a discharge letter for the oxygen. Patient did not have [employer]'s fax number but she did have their phone number: [phone].

## 2019-08-21 NOTE — Assessment & Plan Note (Addendum)
Patient with a history of hair loss reports worsening, diffuse loss in the past month. Notes scalp soreness with intermittent raised lesions during this time. PE is remarkable for scalp pain on palpation. Given her recent hospitalization for acute respiratory failure secondary to Covid, patient's hair loss is most likely secondary to body's physiologic response to recent stressor (acute telogen effluvium). Recent corticosteroid use, timeline of symptoms, and diffuse/nonscarring nature of hair loss supports this diagnosis. Differential also includes thyroid dysfunction and anemia but recent normal CBC (06/02/19) and TSH (05/26/19) make these unlikely. Lichen planus flare-up is an additional etiology that cannot be ruled out but is less likely in the absence of rash, additional cutaneous findings.  - Will place referral for new dermatologist as requested by patient  - Continue to monitor hair loss at upcoming visit and follow-up on dermatology referral

## 2019-08-21 NOTE — Assessment & Plan Note (Deleted)
Patient reports dull, achy pain of bilateral groin for the past 1.5 months that is worse on the left-side and radiates to both knees. Also reports achy pain of hands and feet, bilaterally. PE is remarkable for ulnar deviation of MCPs. Given symmetric, generalized nature of patient's joint pain that improves with activity, the most likely etiology of patient's symptoms is rheumatoid arthritis. Family history significant for RA and physical exam findings support this diagnosis. Osteoarthritis also considered but less likely given symmetric distribution of pain, improvement with exertion, and only mild knee pain on joint line palpation. Additional items on differential include fibromyalgia and long-term sequelae of recent Covid infection. - Rheumatoid factor assessment ordered, will follow-up on results  - Referral for rheumatology placed during today's encounters - Will monitor symptoms at next visit and follow-up on referral

## 2019-08-21 NOTE — Assessment & Plan Note (Addendum)
Well-controlled with diet. A1c trending down with measurement of 6.7 during today's encounter. - Advised to continue current diet and resume moderate walking exercise as tolerated  - Lipid panel ordered to assess risk for cardiovascular disease and need for statin therapy, will follow-up on results  - CMP14+EGFR ordered to assess renal function, will follow-up on results

## 2019-08-22 LAB — CMP14+EGFR
ALT: 20 IU/L (ref 0–32)
AST: 22 IU/L (ref 0–40)
Albumin/Globulin Ratio: 1.6 (ref 1.2–2.2)
Albumin: 4.5 g/dL (ref 3.8–4.8)
Alkaline Phosphatase: 98 IU/L (ref 39–117)
BUN/Creatinine Ratio: 12 (ref 12–28)
BUN: 11 mg/dL (ref 8–27)
Bilirubin Total: 0.4 mg/dL (ref 0.0–1.2)
CO2: 22 mmol/L (ref 20–29)
Calcium: 9.9 mg/dL (ref 8.7–10.3)
Chloride: 104 mmol/L (ref 96–106)
Creatinine, Ser: 0.92 mg/dL (ref 0.57–1.00)
GFR calc Af Amer: 74 mL/min/{1.73_m2} (ref >59)
GFR calc non Af Amer: 64 mL/min/{1.73_m2} (ref >59)
Globulin, Total: 2.9 g/dL (ref 1.5–4.5)
Glucose: 117 mg/dL — ABNORMAL HIGH (ref 65–99)
Potassium: 4.6 mmol/L (ref 3.5–5.2)
Sodium: 139 mmol/L (ref 134–144)
Total Protein: 7.4 g/dL (ref 6.0–8.5)

## 2019-08-22 LAB — LIPID PANEL
Chol/HDL Ratio: 4.3 ratio (ref 0.0–4.4)
Cholesterol, Total: 214 mg/dL — ABNORMAL HIGH (ref 100–199)
HDL: 50 mg/dL (ref >39)
LDL Chol Calc (NIH): 134 mg/dL — ABNORMAL HIGH (ref 0–99)
Triglycerides: 171 mg/dL — ABNORMAL HIGH (ref 0–149)
VLDL Cholesterol Cal: 30 mg/dL (ref 5–40)

## 2019-08-22 LAB — RHEUMATOID FACTOR: Rheumatoid fact SerPl-aCnc: <10 IU/mL (ref 0.0–13.9)

## 2019-08-23 MED ORDER — ATORVASTATIN CALCIUM 40 MG PO TABS: 40.0000 mg | ORAL_TABLET | Freq: Every day | ORAL | 3 refills | Status: DC

## 2019-08-23 NOTE — Telephone Encounter (Signed)
Contacted patient to discuss labs. Needs statin given elevated lipids and diabetes. Will start atorv 40mg  daily. Advised re: possibility of muscle aches, to let me know if that happens.  Also patient reports oxygen was picked up by [place] and there is nothing further I need to do about this  Leeanne Rio, MD

## 2019-08-31 DIAGNOSIS — H25811 Combined forms of age-related cataract, right eye: Secondary | ICD-10-CM | POA: Diagnosis not present

## 2019-09-11 DIAGNOSIS — L438 Other lichen planus: Secondary | ICD-10-CM | POA: Diagnosis not present

## 2019-09-11 DIAGNOSIS — L650 Telogen effluvium: Secondary | ICD-10-CM | POA: Diagnosis not present

## 2019-10-10 DIAGNOSIS — L650 Telogen effluvium: Secondary | ICD-10-CM | POA: Diagnosis not present

## 2019-10-10 DIAGNOSIS — H2512 Age-related nuclear cataract, left eye: Secondary | ICD-10-CM | POA: Diagnosis not present

## 2019-10-10 DIAGNOSIS — L439 Lichen planus, unspecified: Secondary | ICD-10-CM | POA: Diagnosis not present

## 2019-10-12 DIAGNOSIS — H25812 Combined forms of age-related cataract, left eye: Secondary | ICD-10-CM | POA: Diagnosis not present

## 2019-10-26 HISTORY — PX: HAND SURGERY: SHX662

## 2019-10-30 ENCOUNTER — Other Ambulatory Visit: Payer: Self-pay

## 2019-10-30 ENCOUNTER — Ambulatory Visit (INDEPENDENT_AMBULATORY_CARE_PROVIDER_SITE_OTHER): Payer: PPO | Admitting: Family Medicine

## 2019-10-30 VITALS — BP 118/70 | HR 72 | Wt 148.8 lb

## 2019-10-30 DIAGNOSIS — R52 Pain, unspecified: Secondary | ICD-10-CM

## 2019-10-30 DIAGNOSIS — E119 Type 2 diabetes mellitus without complications: Secondary | ICD-10-CM

## 2019-10-30 MED ORDER — TETANUS-DIPHTH-ACELL PERTUSSIS 5-2.5-18.5 LF-MCG/0.5 IM SUSP: 0.5000 mL | Freq: Once | INTRAMUSCULAR | 0 refills | Status: AC

## 2019-10-30 NOTE — Progress Notes (Signed)
Date of Visit: 10/30/2019   HPI:  Diana Roach presents today for routine follow up.  Hand pain -  Having some issues with pain in one hand where she had carpal tunnel surgery last year. Plans to follow up with her hand surgeon.  Generalized pain previously noted, had planned to refer to rheumatology but it does not seem I ever actually placed the referral. Has family history of RA. Prior lab workup here was neg. Pain not worsening but patient would like to get in with rheumatology  She continues to do well after being hospitalized for COVID over the summer Had recent cataract eye surgery, has done well after that Saw derm for her hair loss, diagnosed with telogen effluvium  ROS: See HPI.  [place]: history of hyperlipidemia, lichen planus, asthma, type 2 diabetes   PHYSICAL EXAM: BP 118/70   Pulse 72   Wt 148 lb 12.8 oz (67.5 kg)   SpO2 95%   BMI 28.12 kg/m  Gen: no acute distress, pleasant, cooperative HEENT: normocephalic, atraumatic  Heart: regular rate and rhythm  Lungs: clear to auscultation bilaterally   ASSESSMENT/PLAN:  Health maintenance:  -given rx for tdap to take to her pharmacy -has upcoming appointment for bone density test -will request [facility] records [facility]  Generalized pain Ordered referral to rheumatology since I evidently didn't order this last time  Type 2 diabetes mellitus without complication, without long-term current use of insulin ([place]) Not yet due for A1c, will have her follow up in 3 months for next A1c  Hand pain - not assessed today in detail, she will follow up with her hand surgeon  FOLLOW UP: Follow up in 3 mos for next A1c   Diana Roach, [place]

## 2019-10-30 NOTE — Patient Instructions (Signed)
It was great to see you again today!  Referring to rheumatology Schedule follow up with your hand surgeon Call with any questions See me in 3 months for next A1c  Be well, [name]

## 2019-11-02 NOTE — Assessment & Plan Note (Signed)
Not yet due for A1c, will have her follow up in 3 months for next A1c

## 2019-11-02 NOTE — Assessment & Plan Note (Signed)
Ordered referral to rheumatology since I evidently didn't order this last time

## 2019-11-16 DIAGNOSIS — Z961 Presence of intraocular lens: Secondary | ICD-10-CM | POA: Diagnosis not present

## 2019-11-19 ENCOUNTER — Ambulatory Visit
Admission: RE | Admit: 2019-11-19 | Discharge: 2019-11-19 | Disposition: A | Discharge status: Home / Self Care | Payer: PPO | Source: Ambulatory Visit | Attending: Family Medicine | Admitting: Family Medicine

## 2019-11-19 ENCOUNTER — Other Ambulatory Visit: Payer: Self-pay

## 2019-11-19 DIAGNOSIS — M8580 Other specified disorders of bone density and structure, unspecified site: Secondary | ICD-10-CM

## 2019-11-19 DIAGNOSIS — M8589 Other specified disorders of bone density and structure, multiple sites: Secondary | ICD-10-CM | POA: Diagnosis not present

## 2019-11-19 DIAGNOSIS — Z780 Asymptomatic menopausal state: Secondary | ICD-10-CM | POA: Diagnosis not present

## 2019-11-23 ENCOUNTER — Encounter: Payer: Self-pay | Admitting: Family Medicine

## 2019-11-23 ENCOUNTER — Telehealth: Payer: Self-pay | Admitting: Family Medicine

## 2019-11-23 DIAGNOSIS — M810 Age-related osteoporosis without current pathological fracture: Secondary | ICD-10-CM | POA: Insufficient documentation

## 2019-11-23 DIAGNOSIS — M8580 Other specified disorders of bone density and structure, unspecified site: Secondary | ICD-10-CM | POA: Insufficient documentation

## 2019-11-23 MED ORDER — CALCIUM CARBONATE-VITAMIN D3 600-400 MG-UNIT PO TABS: 2.0000 | ORAL_TABLET | Freq: Every day | ORAL | 1 refills | Status: DC

## 2019-11-23 NOTE — Telephone Encounter (Signed)
Spoke w/ patient about dexa showing osteopenia Recommend calcium & vitamin D supplement  Will rx Check Vit D level next time I see her in a few months  Patient appreciative Leeanne Rio, MD

## 2019-11-28 NOTE — Telephone Encounter (Signed)
Patient calls nurse line stating she has not heard from pharmacy in regards to calcium-vit D medication. I called pharmacy, and since medication is OTC, she will have to purchase, her insurance will not cover. LVM explaining this to patient. Pharmacy is more than willing to help her find what she needs.

## 2019-12-20 DIAGNOSIS — M79642 Pain in left hand: Secondary | ICD-10-CM | POA: Diagnosis not present

## 2019-12-20 DIAGNOSIS — M65342 Trigger finger, left ring finger: Secondary | ICD-10-CM | POA: Diagnosis not present

## 2019-12-20 DIAGNOSIS — M65332 Trigger finger, left middle finger: Secondary | ICD-10-CM | POA: Diagnosis not present

## 2019-12-20 DIAGNOSIS — M65842 Other synovitis and tenosynovitis, left hand: Secondary | ICD-10-CM | POA: Diagnosis not present

## 2019-12-21 NOTE — Progress Notes (Signed)
Office Visit Note  Patient: Diana Roach             Date of Birth: Mar 06, 1951           MRN: [id number]             PCP: Leeanne Rio, MD Referring: Leeanne Rio, MD Visit Date: 12/28/2019 Occupation: @GUAROCC @  Subjective:  Pain in multiple joints.   History of Present Illness: Diana Roach is a 69 y.o. female seen in consultation per request of her PCP.  According to patient she has had aches and pains in her joints for the last 10 years which come and go.  She has been having persistent swelling in her right foot for many years.  She has discomfort in her bilateral feet.  She has occasional discomfort in her knee joints.  She describes discomfort in her neck and lower back.  She has had x-rays in the past.  She has seen Dr. Amedeo Plenty in the past for bilateral carpal tunnel release.  She states Dr. Amedeo Plenty did x-ray of her cervical spine and advised her that she has spurs in her cervical spine.  She continues to have numbness in her bilateral hands.  He also told her that she has arthritis in her shoulder joints.  She continues to have pain and discomfort in her hands with intermittent swelling.  She also gives history of some discomfort in her elbow joints.  She has occasional discomfort in her shoulders.  She denies any radiculopathy from her lower lumbar region.  There is positive history of rheumatoid arthritis in her mother and maternal grandmother per patient.  She has been diagnosed with lichen planus and has been followed by a dermatologist.    Activities of Daily Living:  Patient reports morning stiffness for 1 hour.   Patient Reports nocturnal pain.  Difficulty dressing/grooming: Denies Difficulty climbing stairs: Denies Difficulty getting out of chair: Denies Difficulty using hands for taps, buttons, cutlery, and/or writing: Reports  Review of Systems  Constitutional: Positive for fatigue.  HENT: Negative for mouth dryness and nose dryness.   Eyes:  Positive for dryness. Negative for itching.  Respiratory: Negative for shortness of breath and difficulty breathing.        Due to asthma  Cardiovascular: Negative for chest pain and palpitations.  Gastrointestinal: Positive for constipation. Negative for blood in stool and diarrhea.  Endocrine: Negative for increased urination.  Genitourinary: Negative for difficulty urinating and painful urination.  Musculoskeletal: Positive for arthralgias, joint pain, joint swelling and morning stiffness.  Skin: Positive for rash. Negative for color change and hair loss.       Lichen planus  Allergic/Immunologic: Negative for susceptible to infections.  Neurological: Positive for numbness. Negative for dizziness, headaches, memory loss and weakness.  Hematological: Negative for bruising/bleeding tendency.  Psychiatric/Behavioral: Negative for confusion.    PMFS History:  Patient Active Problem List   Diagnosis Date Noted  . Osteopenia 11/23/2019  . Generalized pain 08/21/2019  . GERD (gastroesophageal reflux disease) 06/19/2019  . COVID-19 05/23/2019  . Palpitations 02/21/2019  . Type 2 diabetes mellitus without complication, without long-term current use of insulin ([place]) 10/24/2017  . Varicose vein of leg 03/09/2016  . UPJ obstruction, congenital 01/28/2016  . Lumbosacral radiculopathy 01/27/2016  . Diverticulosis 10/01/2014  . Radiculopathy, cervical 09/04/2014  . Bilateral finger numbness 09/04/2014  . Abdominal pain 04/24/2014  . Hair loss 07/19/2011  . Asthma, moderate persistent 05/13/2011  . Lichen planus [id number]  .  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 HYPERLIPIDEMIA 12/22/2006    Past Medical History:  Diagnosis Date  . Asthma   . Cataract   . Diabetes mellitus without complication ([place])   . Diverticulitis   . Environmental allergies   . Lichen planus     Family History  Problem Relation Age of Onset  . Hypothyroidism Sister   . Cervical cancer Sister   . Fibromyalgia  Sister   . Heart disease Mother   . Hypertension Brother   . Lichen planus Brother   . Cancer Brother        lung  . Heart attack Brother   . Diabetes Daughter   . Cancer Daughter        skin cancer  . Colon cancer Neg Hx   . Pancreatic cancer Neg Hx   . Stomach cancer Neg Hx   . Esophageal cancer Neg Hx   . Breast cancer Neg Hx    Past Surgical History:  Procedure Laterality Date  . APPENDECTOMY  1969  . BACK SURGERY  1986   lumbar spine  . CARPAL TUNNEL RELEASE Bilateral   . CATARACT EXTRACTION, BILATERAL  2020  . COLONOSCOPY  2015   Social History   Social History Narrative   Lives with daughter, son-in-law, grandson   House in one level house; has stairs to get into house with handrails, grab bars not present, smoke alarms present; throw rugs with backing   Has dog named Willow, chilhuahua/fox terrier   Likes to eat variety, meats, vegetables, fruits   Caffeine- sodas (Pepsi), water, tea, occasional orange juice   Wears seatbelt, wears sunblock      Patient likes to swim, go to beach, camping, shopping   Immunization History  Administered Date(s) Administered  . Influenza Split 11/03/2012  . Influenza,inj,Quad PF,6+ Mos 09/24/2013, 09/04/2014, 08/11/2016, 07/29/2017, 09/12/2018, 07/24/2019  . Pneumococcal Conjugate-13 07/29/2017  . Pneumococcal Polysaccharide-23 09/12/2018  . Td 06/14/2008     Objective: Vital Signs: BP 127/78 (BP Location: Right Arm, Patient Position: Sitting, Cuff Size: Normal)   Pulse (!) 57   Resp 14   Ht 5\' 1"  (1.549 m)   Wt 150 lb 9.6 oz (68.3 kg)   BMI 28.46 kg/m    Physical Exam Vitals and nursing note reviewed.  Constitutional:      Appearance: She is well-developed.  HENT:     Head: Normocephalic and atraumatic.  Eyes:     Conjunctiva/sclera: Conjunctivae normal.  Cardiovascular:     Rate and Rhythm: Normal rate and regular rhythm.     Heart sounds: Normal heart sounds.  Pulmonary:     Effort: Pulmonary effort is  normal.     Breath sounds: Normal breath sounds.  Abdominal:     General: Bowel sounds are normal.     Palpations: Abdomen is soft.  Musculoskeletal:     Cervical back: Normal range of motion.  Lymphadenopathy:     Cervical: No cervical adenopathy.  Skin:    General: Skin is warm and dry.     Capillary Refill: Capillary refill takes less than 2 seconds.     Findings: Rash present.     Comments: Erythematous patches on bilateral lower extremities due to lichen planus.  Neurological:     Mental Status: She is alert and oriented to person, place, and time.  Psychiatric:        Behavior: Behavior normal.      Musculoskeletal Exam: C-spine was in good range of motion.  Shoulder joints, elbow  joints, wrist joints with good range of motion.  She has mild DIP and PIP thickening.  No synovitis was noted over MCP joints or wrist joints.  A mucinous cyst was noted on her right index finger DIP joint..  She has left fourth finger Dupuytren's contracture.  She has good range of motion of lumbar spine and old surgical scar without radiculopathy.  She has good range of motion of hip joints.  Some trochanteric bursitis was noted.  Knee joints, ankle joints, MTPs and PIPs with good range of motion.  She has some tenderness over right first MTP joint.  Synovial cyst was noted on top of her left first metatarsal without any tenderness.  CDAI Exam: CDAI Score: -- Patient Global: --; Provider Global: -- Swollen: --; Tender: -- Joint Exam 12/28/2019   No joint exam has been documented for this visit   There is currently no information documented on the homunculus. Go to the Rheumatology activity and complete the homunculus joint exam.  Investigation: No additional findings.  Imaging: No results found.  Recent Labs: Lab Results  Component Value Date   WBC 13.4 (H) 06/02/2019   HGB 11.9 (L) 06/02/2019   PLT 486 (H) 06/02/2019   NA 139 08/21/2019   K 4.6 08/21/2019   CL 104 08/21/2019   CO2 22  08/21/2019   GLUCOSE 117 (H) 08/21/2019   BUN 11 08/21/2019   CREATININE 0.92 08/21/2019   BILITOT 0.4 08/21/2019   ALKPHOS 98 08/21/2019   AST 22 08/21/2019   ALT 20 08/21/2019   PROT 7.4 08/21/2019   ALBUMIN 4.5 08/21/2019   CALCIUM 9.9 08/21/2019   GFRAA 74 08/21/2019    Speciality Comments: No specialty comments available.  Procedures:  No procedures performed Allergies: Codeine, Minocycline, Contrast media [iodinated diagnostic agents], and Levaquin [levofloxacin]   Assessment / Plan:     Visit Diagnoses: Generalized pain - RF negative on 08/21/19.  Patient complains of pain and discomfort in multiple joints.  No synovitis noted on examination today.  Pain in both hands -she gives history of intermittent pain and swelling in her bilateral hands.  Clinical and radiographic findings were consistent with osteoarthritis.  To complete the work-up I will obtain following labs today.  We will also consider obtaining ultrasound of bilateral hands.  Plan: XR Hand 2 View Right, XR Hand 2 View Left, Uric acid, Sedimentation rate, Cyclic citrul peptide antibody, IgG, 14-3-3 eta Protein  Dupuytren's contracture of left hand - Status post injection by Dr. Amedeo Plenty in left ring finger yesterday.  Patient states after cortisone injection the pain and swelling in her hands improved.  S/p bilateral carpal tunnel release-she continues to have some paresthesias.  Trochanteric bursitis of both hips-she had tenderness over bilateral trochanteric bursa.  I have given her a handout on IT band exercises.  Pain in both feet -she complains of discomfort in her bilateral feet.  Especially her right first MTP joint has been painful.  The x-rays reveal osteoarthritic changes and severe right first MTP narrowing consistent with hallux rigidus.  Plan: XR Foot 2 Views Right, XR Foot 2 Views Left  Radiculopathy, cervical-she is currently not having any radiculopathy.  DDD (degenerative disc disease), lumbar -  S/p surgery in the 80s.  Currently she is not having any radiculopathy.  She states that she has off-and-on discomfort in her lower back.  Other medical problems are listed as follows:  Moderate persistent asthma without complication  Type 2 diabetes mellitus without complication, without long-term current use  of insulin ([place])  Diverticulosis  History of gastroesophageal reflux (GERD)  History of hyperlipidemia  Lichen planus  Osteopenia of multiple sites  History of COVID-19 - 07/20  Family history of rheumatoid arthritis - Mother, good maternal grandmother  Orders: Orders Placed This Encounter  Procedures  . XR Hand 2 View Right  . XR Hand 2 View Left  . XR Foot 2 Views Right  . XR Foot 2 Views Left  . Uric acid  . Sedimentation rate  . Cyclic citrul peptide antibody, IgG  . 14-3-3 eta Protein   No orders of the defined types were placed in this encounter.   Face-to-face time spent with patient was 50 minutes. Greater than 50% of time was spent in counseling and coordination of care.  Follow-Up Instructions: Return in about 3 months (around 03/29/2020) for Osteoarthritis, DDD, pain in multiple joints.   Bo Merino, MD  Note - This record has been created using Editor, commissioning.  Chart creation errors have been sought, but may not always  have been located. Such creation errors do not reflect on  the standard of medical care.

## 2019-12-28 ENCOUNTER — Ambulatory Visit: Payer: Self-pay

## 2019-12-28 ENCOUNTER — Ambulatory Visit (INDEPENDENT_AMBULATORY_CARE_PROVIDER_SITE_OTHER): Payer: PPO | Admitting: Rheumatology

## 2019-12-28 ENCOUNTER — Other Ambulatory Visit: Payer: Self-pay

## 2019-12-28 ENCOUNTER — Encounter: Payer: Self-pay | Admitting: Rheumatology

## 2019-12-28 VITALS — BP 127/78 | HR 57 | Resp 14 | Ht 61.0 in | Wt 150.6 lb

## 2019-12-28 DIAGNOSIS — M79672 Pain in left foot: Secondary | ICD-10-CM

## 2019-12-28 DIAGNOSIS — M79671 Pain in right foot: Secondary | ICD-10-CM

## 2019-12-28 DIAGNOSIS — Z8719 Personal history of other diseases of the digestive system: Secondary | ICD-10-CM | POA: Diagnosis not present

## 2019-12-28 DIAGNOSIS — M720 Palmar fascial fibromatosis [Dupuytren]: Secondary | ICD-10-CM

## 2019-12-28 DIAGNOSIS — M5412 Radiculopathy, cervical region: Secondary | ICD-10-CM | POA: Diagnosis not present

## 2019-12-28 DIAGNOSIS — M8589 Other specified disorders of bone density and structure, multiple sites: Secondary | ICD-10-CM

## 2019-12-28 DIAGNOSIS — Z9889 Other specified postprocedural states: Secondary | ICD-10-CM | POA: Diagnosis not present

## 2019-12-28 DIAGNOSIS — M5136 Other intervertebral disc degeneration, lumbar region: Secondary | ICD-10-CM | POA: Diagnosis not present

## 2019-12-28 DIAGNOSIS — J4540 Moderate persistent asthma, uncomplicated: Secondary | ICD-10-CM

## 2019-12-28 DIAGNOSIS — E119 Type 2 diabetes mellitus without complications: Secondary | ICD-10-CM

## 2019-12-28 DIAGNOSIS — Z8261 Family history of arthritis: Secondary | ICD-10-CM

## 2019-12-28 DIAGNOSIS — R52 Pain, unspecified: Secondary | ICD-10-CM

## 2019-12-28 DIAGNOSIS — Z8639 Personal history of other endocrine, nutritional and metabolic disease: Secondary | ICD-10-CM

## 2019-12-28 DIAGNOSIS — L439 Lichen planus, unspecified: Secondary | ICD-10-CM

## 2019-12-28 DIAGNOSIS — M7061 Trochanteric bursitis, right hip: Secondary | ICD-10-CM | POA: Diagnosis not present

## 2019-12-28 DIAGNOSIS — M7062 Trochanteric bursitis, left hip: Secondary | ICD-10-CM

## 2019-12-28 DIAGNOSIS — M79642 Pain in left hand: Secondary | ICD-10-CM | POA: Diagnosis not present

## 2019-12-28 DIAGNOSIS — M79641 Pain in right hand: Secondary | ICD-10-CM

## 2019-12-28 DIAGNOSIS — K5790 Diverticulosis of intestine, part unspecified, without perforation or abscess without bleeding: Secondary | ICD-10-CM

## 2019-12-28 DIAGNOSIS — Z8616 Personal history of COVID-19: Secondary | ICD-10-CM

## 2019-12-28 NOTE — Patient Instructions (Signed)
Iliotibial Band Syndrome Rehab Ask your health care provider which exercises are safe for you. Do exercises exactly as told by your health care provider and adjust them as directed. It is normal to feel mild stretching, pulling, tightness, or discomfort as you do these exercises. Stop right away if you feel sudden pain or your pain gets significantly worse. Do not begin these exercises until told by your health care provider. Stretching and range-of-motion exercises These exercises warm up your muscles and joints and improve the movement and flexibility of your hip and pelvis. Quadriceps stretch, prone  1. Lie on your abdomen on a firm surface, such as a bed or padded floor (prone position). 2. Bend your left / right knee and reach back to hold your ankle or pant leg. If you cannot reach your ankle or pant leg, loop a belt around your foot and grab the belt instead. 3. Gently pull your heel toward your buttocks. Your knee should not slide out to the side. You should feel a stretch in the front of your thigh and knee (quadriceps). 4. Hold this position for __________ seconds. Repeat __________ times. Complete this exercise __________ times a day. Iliotibial band stretch An iliotibial band is a strong band of muscle tissue that runs from the outer side of your hip to the outer side of your thigh and knee. 1. Lie on your side with your left / right leg in the top position. 2. Bend both of your knees and grab your left / right ankle. Stretch out your bottom arm to help you balance. 3. Slowly bring your top knee back so your thigh goes behind your trunk. 4. Slowly lower your top leg toward the floor until you feel a gentle stretch on the outside of your left / right hip and thigh. If you do not feel a stretch and your knee will not fall farther, place the heel of your other foot on top of your knee and pull your knee down toward the floor with your foot. 5. Hold this position for __________  seconds. Repeat __________ times. Complete this exercise __________ times a day. Strengthening exercises These exercises build strength and endurance in your hip and pelvis. Endurance is the ability to use your muscles for a long time, even after they get tired. Straight leg raises, side-lying This exercise strengthens the muscles that rotate the leg at the hip and move it away from your body (hip abductors). 1. Lie on your side with your left / right leg in the top position. Lie so your head, shoulder, hip, and knee line up. You may bend your bottom knee to help you balance. 2. Roll your hips slightly forward so your hips are stacked directly over each other and your left / right knee is facing forward. 3. Tense the muscles in your outer thigh and lift your top leg 4-6 inches (10-15 cm). 4. Hold this position for __________ seconds. 5. Slowly return to the starting position. Let your muscles relax completely before doing another repetition. Repeat __________ times. Complete this exercise __________ times a day. Leg raises, prone This exercise strengthens the muscles that move the hips (hip extensors). 1. Lie on your abdomen on your bed or a firm surface. You can put a pillow under your hips if that is more comfortable for your lower back. 2. Bend your left / right knee so your foot is straight up in the air. 3. Squeeze your buttocks muscles and lift your left / right thigh   off the bed. Do not let your back arch. 4. Tense your thigh muscle as hard as you can without increasing any knee pain. 5. Hold this position for __________ seconds. 6. Slowly lower your leg to the starting position and allow it to relax completely. Repeat __________ times. Complete this exercise __________ times a day. Hip hike 1. Stand sideways on a bottom step. Stand on your left / right leg with your other foot unsupported next to the step. You can hold on to the railing or wall for balance if needed. 2. Keep your knees  straight and your torso square. Then lift your left / right hip up toward the ceiling. 3. Slowly let your left / right hip lower toward the floor, past the starting position. Your foot should get closer to the floor. Do not lean or bend your knees. Repeat __________ times. Complete this exercise __________ times a day. This information is not intended to replace advice given to you by your health care provider. Make sure you discuss any questions you have with your health care provider. Document Revised: 02/01/2019 Document Reviewed: 08/02/2018 Elsevier Patient Education  2020 Elsevier Inc.  

## 2020-01-07 LAB — URIC ACID: Uric Acid, Serum: 6.3 mg/dL (ref 2.5–7.0)

## 2020-01-07 LAB — CYCLIC CITRUL PEPTIDE ANTIBODY, IGG: Cyclic Citrullin Peptide Ab: <16 UNITS

## 2020-01-07 LAB — 14-3-3 ETA PROTEIN: 14-3-3 eta Protein: <0.2 ng/mL (ref <0.2)

## 2020-01-07 LAB — SEDIMENTATION RATE: Sed Rate: 17 mm/h (ref 0–30)

## 2020-01-08 ENCOUNTER — Other Ambulatory Visit: Payer: Self-pay

## 2020-01-08 ENCOUNTER — Ambulatory Visit (INDEPENDENT_AMBULATORY_CARE_PROVIDER_SITE_OTHER): Payer: PPO | Admitting: Family Medicine

## 2020-01-08 VITALS — BP 128/70 | HR 72 | Wt 151.2 lb

## 2020-01-08 DIAGNOSIS — J4540 Moderate persistent asthma, uncomplicated: Secondary | ICD-10-CM

## 2020-01-08 DIAGNOSIS — E119 Type 2 diabetes mellitus without complications: Secondary | ICD-10-CM

## 2020-01-08 DIAGNOSIS — M8580 Other specified disorders of bone density and structure, unspecified site: Secondary | ICD-10-CM | POA: Diagnosis not present

## 2020-01-08 DIAGNOSIS — R52 Pain, unspecified: Secondary | ICD-10-CM | POA: Diagnosis not present

## 2020-01-08 LAB — POCT GLYCOSYLATED HEMOGLOBIN (HGB A1C): HbA1c, POC (controlled diabetic range): 6.7 % (ref 0.0–7.0)

## 2020-01-08 MED ORDER — LORATADINE 10 MG PO TABS: 10.0000 mg | ORAL_TABLET | Freq: Every day | ORAL | 1 refills | Status: DC

## 2020-01-08 NOTE — Assessment & Plan Note (Addendum)
Well controlled with A1c today 6.7, will have her follow up in 3 months for next A1c. Counseled on diet

## 2020-01-08 NOTE — Progress Notes (Signed)
    SUBJECTIVE:   CHIEF COMPLAINT / HPI:  Diana Roach presents today for routine follow up  Asthma/cough/allergies - Has used albuterol 3x this past months due to allergies acting up with the changing of the season. Additionally she a 1 week history of cough with green phlegm. States that she has had this before with her allergies. She does not endorse a history of fever, and does not feel sick. Needs claritin refilled.  Rheumatology f/u - seen by rheumatology recently, diagnosed with likely osteoarthritis, though has more tests upcoming  Diabetes - not currently taking any medications for this. Admits to dietary indiscretions. Due for A1c check today  PERTINENT  PMH / PSH: HLD, lichen planus, asthma, type 2 DM  OBJECTIVE:   BP 128/70   Pulse 72   Wt 151 lb 3.2 oz (68.6 kg)   SpO2 97%   BMI 28.57 kg/m   Gen: no acute distress, pleasant, cooperative HEENT: normocephalic, atraumatic, tympanic membranes clear bilaterally Heart: regular rate and rhythm  Lungs: clear to auscultation bilaterally, normal work of breathing , no cough heard  ASSESSMENT/PLAN:   Health maintenance: -will request eye records from [place] Vaccine site information given   Generalized pain Continue with rheumatology evaluation  Type 2 diabetes mellitus without complication, without long-term current use of insulin ([place]) Well controlled with A1c today 6.7, will have her follow up in 3 months for next A1c. Counseled on diet   Asthma, moderate persistent Well controlled. Continue current medication regimen.  Osteopenia Plan to recheck Vit D at next visit As patient came in for visit and had recent onset cough (was brought back despite reporting this to the front desk), we were not able to get labs on her today since she will be tested for COVID. Check next visit. Encouraged 1200 IU daily of D3  Cough/allergies- patient believes due to allergies. Reasonable to be tested for COVID. Gave info on  driveup COVID testing through [facility]. If negative, likely attributable to allergies. Refilled claritin.   Follow up: follow up in 3 mo for next A1c  [place]   Patient seen along with [place]. I personally evaluated this patient along with the student, and verified all aspects of the history, physical exam, and medical decision making as documented by the student. I agree with the student's documentation and have made all necessary edits.  Chrisandra Netters, MD [place]

## 2020-01-08 NOTE — Assessment & Plan Note (Addendum)
Well controlled. Continue current medication regimen.  

## 2020-01-08 NOTE — Patient Instructions (Signed)
It was great to see you again today!  To schedule your COVID test go to [email or web address]  Go to [facility]'s website to sign up for info on the vaccine COVID-19 Vaccine Information can be found at: ShippingScam.co.uk For questions related to vaccine distribution or appointments, please email vaccine@ .com or call [phone].   You can also go to the [employer] website: www.[email or web address]  Also F[employer] site: www.gsomassvax.org  [facility] also has vaccinations  Refilled claritin  Be well, [name]

## 2020-01-08 NOTE — Assessment & Plan Note (Addendum)
Continue with rheumatology evaluation

## 2020-01-09 ENCOUNTER — Telehealth: Payer: Self-pay

## 2020-01-09 DIAGNOSIS — R05 Cough: Secondary | ICD-10-CM | POA: Diagnosis not present

## 2020-01-09 DIAGNOSIS — Z20828 Contact with and (suspected) exposure to other viral communicable diseases: Secondary | ICD-10-CM | POA: Diagnosis not present

## 2020-01-09 NOTE — Telephone Encounter (Signed)
Patient calls nurse line to inform provider that she has tested positive for COVID. Patient was seen in the office yesterday 3/16. Patient reports having COVID in July 2020. Patient states that she does not feel sick but wanted to inform PCP of positive results.   To PCP   Talbot Grumbling, RN

## 2020-01-09 NOTE — Telephone Encounter (Signed)
Thank you for alerting [place]. The CMA and anyone in direct contact with the patient need to contact [facility] regarding need for testing vs monitoring.

## 2020-01-09 NOTE — Assessment & Plan Note (Addendum)
Plan to recheck Vit D at next visit As patient came in for visit and had recent onset cough (was brought back despite reporting this to the front desk), we were not able to get labs on her today since she will be tested for COVID. Check next visit. Encouraged 1200 IU daily of D3

## 2020-01-09 NOTE — Telephone Encounter (Signed)
Called and spoke with patient. She reports being tested with a rapid COVID test at [facility] in [place] this morning. The result returned before she left the building. Suspect this was a rapid antigen test.  The sensitivity/specificity of these tests are unclear - this could represent a false positive. Patient does admit to being exposed to a COVID positive person 2 weeks ago, then 8 days ago began coughing. Patient does not feel ill. She was admitted with COVID back in July and had persistent hypoxia at that time. Unclear whether this represents a false positive antigen test or true recurrence of COVID with just milder symptoms.   Advised she quarantine for 2 more days (will be 10 days since onset of symptoms). Patient appreciative. She will call back if she develops any new or worsening symptoms.   Leeanne Rio, MD

## 2020-01-17 DIAGNOSIS — H43812 Vitreous degeneration, left eye: Secondary | ICD-10-CM | POA: Diagnosis not present

## 2020-01-29 ENCOUNTER — Ambulatory Visit: Payer: Self-pay | Admitting: Rheumatology

## 2020-02-25 ENCOUNTER — Telehealth: Payer: Self-pay | Admitting: Family Medicine

## 2020-02-25 ENCOUNTER — Other Ambulatory Visit: Payer: Self-pay | Admitting: Family Medicine

## 2020-02-25 DIAGNOSIS — Z1231 Encounter for screening mammogram for malignant neoplasm of breast: Secondary | ICD-10-CM

## 2020-02-25 NOTE — Telephone Encounter (Signed)
Pt would like to have a referral to have a mammogram. She has a bump on her right breast and it is very itchy. Please call to discuss. jw

## 2020-02-26 ENCOUNTER — Ambulatory Visit (INDEPENDENT_AMBULATORY_CARE_PROVIDER_SITE_OTHER): Payer: PPO | Admitting: Family Medicine

## 2020-02-26 ENCOUNTER — Other Ambulatory Visit: Payer: Self-pay

## 2020-02-26 DIAGNOSIS — R21 Rash and other nonspecific skin eruption: Secondary | ICD-10-CM | POA: Diagnosis not present

## 2020-02-26 MED ORDER — HYDROCORTISONE 2.5 % EX OINT: TOPICAL_OINTMENT | Freq: Two times a day (BID) | CUTANEOUS | 1 refills | Status: DC

## 2020-02-26 NOTE — Patient Instructions (Addendum)
It was nice seeing you today, thanks for coming in!  The itchiness on your back is likely to due nerve irritation. You should use Sarna anti-itch cream OTC to apply as needed for the itchiness.  The spot on your breast is likely due to irritation, apply 2.5% hydrocortisone ointment everyday on the spot.  You can go ahead and schedule your routine mammogram for this year.  If the breast redness or irritation is not completely gone in 2-3 weeks OR if getting worse you should come back.   Best, Tana Conch, Medical Student

## 2020-02-26 NOTE — Progress Notes (Signed)
    SUBJECTIVE:   CHIEF COMPLAINT / HPI:   Itching R breast/back: She endorses a red, itchy bump on her R breast, immediately to the right of her nipple. The spot has been there for 3 weeks, but has decreased in size over time. She has tried applying cortisone cream 2x which has helped some but feels that the lesion gets worse when doesn't apply the cortisone cream. She additionally has a pruritic spot on her back between her shoulder blades that has been present for several months. Her daughter first noticed the spot on her back and said that it was erythematous and scaly. Cortisone cream has helped some with the itching but this also returns when she stops applying the cream. She denies any changes in her nipple, nipple discharge, pain, or burning. She does not have any weight loss. Last mammogram 03/2020 was negative for concerns for malignancy.   PERTINENT  PMH / PSH: lichen planus, lichenoid dermatitis, T2DM  OBJECTIVE:   BP 126/80   Pulse 90   Ht 5\' 1"  (1.549 m)   Wt 150 lb 12.8 oz (68.4 kg)   SpO2 97%   BMI 28.49 kg/m   Physical Exam Constitutional:      Appearance: Normal appearance.  Lymphadenopathy:     Cervical: No cervical adenopathy.  Skin:      Neurological:     Mental Status: She is alert.      ASSESSMENT/PLAN:   No problem-specific Assessment & Plan notes found for this encounter.   Breast irritation The appearance of the erythematous papule on her breast is most consistent with an irritation. It looks somewhat similar to her lichen planus on her legs but this would be a very unusual presentation. Prescribing 2.5% hydrocortisone ointment to be applied to the spot daily. She is due for her yearly mammogram, so recommended scheduling that to rule out any possible malignancy, however this would be unlikely given the lack of nipple changes or discharge, no axillary lymphadenopathy, and previous negative mammograms. Instructed to return if lesion does not completely  resolve or gets worse.  Pruritis of back As there are no current skin changes present on her back, her pruritis is likely due to a nerve irritation. Instructed to use OTC Sarna anti-itch cream as needed for itchiness.   [place]   I was present and approve the above [employer]

## 2020-02-27 ENCOUNTER — Ambulatory Visit: Payer: Self-pay

## 2020-02-27 ENCOUNTER — Ambulatory Visit (INDEPENDENT_AMBULATORY_CARE_PROVIDER_SITE_OTHER): Payer: PPO | Admitting: Rheumatology

## 2020-02-27 ENCOUNTER — Other Ambulatory Visit: Payer: Self-pay

## 2020-02-27 DIAGNOSIS — R21 Rash and other nonspecific skin eruption: Secondary | ICD-10-CM | POA: Insufficient documentation

## 2020-02-27 DIAGNOSIS — M79641 Pain in right hand: Secondary | ICD-10-CM | POA: Diagnosis not present

## 2020-02-27 DIAGNOSIS — M79642 Pain in left hand: Secondary | ICD-10-CM | POA: Diagnosis not present

## 2020-02-27 HISTORY — DX: Rash and other nonspecific skin eruption: R21

## 2020-02-27 NOTE — Assessment & Plan Note (Signed)
The appearance of the erythematous papule on her breast is most consistent with an irritation. It looks somewhat similar to her lichen planus on her legs but this would be a very unusual presentation. Prescribing 2.5% hydrocortisone ointment to be applied to the spot daily. She is due for her yearly mammogram, so recommended scheduling that to rule out any possible malignancy, however this would be unlikely given the lack of nipple changes or discharge, no axillary lymphadenopathy, and previous negative mammograms. Instructed to return if lesion does not completely resolve or gets worse.

## 2020-03-23 NOTE — Progress Notes (Signed)
Office Visit Note  Patient: Diana Roach             Date of Birth: 03-06-1951           MRN: [id number]             PCP: Leeanne Rio, MD Referring: Leeanne Rio, MD Visit Date: 04/02/2020 Occupation: @GUAROCC @  Subjective:  Stiffness in both hands and left hip pain.   History of Present Illness: Diana Roach is a 69 y.o. female with history of osteoarthritis.  She states she continues to have pain and stiffness in her bilateral hands.  She has difficulty making the fist.  She states the cortisone injection given by Dr. Amedeo Plenty for her left third trigger finger did not last.  She has been also having pain over the left hip which she describes over the trochanteric bursa.  She states the pain radiates down to her knee.  She continues to have some discomfort in her feet due to osteoarthritis.  She does not like taking a lot of medications.  She has chronic lower back pain. Activities of Daily Living:  Patient reports morning stiffness for  24 hours.   Patient Reports nocturnal pain.  Difficulty dressing/grooming: Denies Difficulty climbing stairs: Denies Difficulty getting out of chair: Denies Difficulty using hands for taps, buttons, cutlery, and/or writing: Reports  Review of Systems  Constitutional: Negative for fatigue, night sweats, weight gain and weight loss.  HENT: Positive for mouth sores. Negative for trouble swallowing, trouble swallowing, mouth dryness and nose dryness.   Eyes: Positive for itching and dryness. Negative for pain, redness and visual disturbance.  Respiratory: Negative for cough, shortness of breath and difficulty breathing.   Cardiovascular: Negative for chest pain, palpitations, hypertension, irregular heartbeat and swelling in legs/feet.  Gastrointestinal: Positive for constipation. Negative for blood in stool and diarrhea.  Endocrine: Negative for increased urination.  Genitourinary: Negative for difficulty urinating and vaginal  dryness.  Musculoskeletal: Positive for arthralgias, joint pain, myalgias, morning stiffness and myalgias. Negative for joint swelling, muscle weakness and muscle tenderness.  Skin: Positive for rash and redness. Negative for color change, hair loss, skin tightness, ulcers and sensitivity to sunlight.  Allergic/Immunologic: Negative for susceptible to infections.  Neurological: Positive for numbness. Negative for dizziness, headaches, memory loss, night sweats and weakness.  Hematological: Negative for bruising/bleeding tendency and swollen glands.  Psychiatric/Behavioral: Negative for depressed mood, confusion and sleep disturbance. The patient is not nervous/anxious.     PMFS History:  Patient Active Problem List   Diagnosis Date Noted  . Rash 02/27/2020  . Osteopenia 11/23/2019  . Generalized pain 08/21/2019  . GERD (gastroesophageal reflux disease) 06/19/2019  . COVID-19 05/23/2019  . Palpitations 02/21/2019  . Type 2 diabetes mellitus without complication, without long-term current use of insulin ([place]) 10/24/2017  . Varicose vein of leg 03/09/2016  . UPJ obstruction, congenital 01/28/2016  . Lumbosacral radiculopathy 01/27/2016  . Diverticulosis 10/01/2014  . Radiculopathy, cervical 09/04/2014  . Bilateral finger numbness 09/04/2014  . Abdominal pain 04/24/2014  . Hair loss 07/19/2011  . Asthma, moderate persistent 05/13/2011  . Lichen planus [id number]  . 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 HYPERLIPIDEMIA 12/22/2006    Past Medical History:  Diagnosis Date  . Asthma   . Cataract   . Diabetes mellitus without complication ([place])   . Diverticulitis   . Environmental allergies   . Lichen planus     Family History  Problem Relation Age of Onset  . Hypothyroidism Sister   .  Cervical cancer Sister   . Fibromyalgia Sister   . Heart disease Mother   . Hypertension Brother   . Lichen planus Brother   . Cancer Brother        lung  . Heart attack Brother   . Diabetes  Daughter   . Cancer Daughter        skin cancer  . Colon cancer Neg Hx   . Pancreatic cancer Neg Hx   . Stomach cancer Neg Hx   . Esophageal cancer Neg Hx   . Breast cancer Neg Hx    Past Surgical History:  Procedure Laterality Date  . APPENDECTOMY  1969  . BACK SURGERY  1986   lumbar spine  . CARPAL TUNNEL RELEASE Bilateral   . CATARACT EXTRACTION, BILATERAL  2020  . COLONOSCOPY  2015   Social History   Social History Narrative   Lives with daughter, son-in-law, grandson   House in one level house; has stairs to get into house with handrails, grab bars not present, smoke alarms present; throw rugs with backing   Has dog named Willow, chilhuahua/fox terrier   Likes to eat variety, meats, vegetables, fruits   Caffeine- sodas (Pepsi), water, tea, occasional orange juice   Wears seatbelt, wears sunblock      Patient likes to swim, go to beach, camping, shopping   Immunization History  Administered Date(s) Administered  . Influenza Split 11/03/2012  . Influenza,inj,Quad PF,6+ Mos 09/24/2013, 09/04/2014, 08/11/2016, 07/29/2017, 09/12/2018, 07/24/2019  . Pneumococcal Conjugate-13 07/29/2017  . Pneumococcal Polysaccharide-23 09/12/2018  . Td 06/14/2008     Objective: Vital Signs: BP 123/73 (BP Location: Left Arm, Patient Position: Sitting, Cuff Size: Normal)   Pulse 66   Resp 14   Ht 5\' 1"  (1.549 m)   Wt 148 lb 12.8 oz (67.5 kg)   BMI 28.12 kg/m    Physical Exam Vitals and nursing note reviewed.  Constitutional:      Appearance: She is well-developed.  HENT:     Head: Normocephalic and atraumatic.  Eyes:     Conjunctiva/sclera: Conjunctivae normal.  Cardiovascular:     Rate and Rhythm: Normal rate and regular rhythm.     Heart sounds: Normal heart sounds.  Pulmonary:     Effort: Pulmonary effort is normal.     Breath sounds: Normal breath sounds.  Abdominal:     General: Bowel sounds are normal.     Palpations: Abdomen is soft.  Musculoskeletal:     Cervical  back: Normal range of motion.  Lymphadenopathy:     Cervical: No cervical adenopathy.  Skin:    General: Skin is warm and dry.     Capillary Refill: Capillary refill takes less than 2 seconds.  Neurological:     Mental Status: She is alert and oriented to person, place, and time.  Psychiatric:        Behavior: Behavior normal.      Musculoskeletal Exam: C-spine was in good range of motion.  She has some limitation range of motion of her lumbar spine and discomfort.  Shoulder joints, elbow joints with good range of motion.  She had bilateral PIP and DIP thickening with incomplete fist formation.  She had left third digit Dupuytren's contracture.  She has a mucinous cyst on her right second digit which is pressing on her nail.  She had tenderness on palpation of her left trochanteric bursa.  No synovitis was noted over knee joints or ankle joints.  CDAI Exam: CDAI Score: -- Patient Global: --;  Provider Global: -- Swollen: --; Tender: -- Joint Exam 04/02/2020   No joint exam has been documented for this visit   There is currently no information documented on the homunculus. Go to the Rheumatology activity and complete the homunculus joint exam.  Investigation: No additional findings.  Imaging: No results found.  Recent Labs: Lab Results  Component Value Date   WBC 13.4 (H) 06/02/2019   HGB 11.9 (L) 06/02/2019   PLT 486 (H) 06/02/2019   NA 139 08/21/2019   K 4.6 08/21/2019   CL 104 08/21/2019   CO2 22 08/21/2019   GLUCOSE 117 (H) 08/21/2019   BUN 11 08/21/2019   CREATININE 0.92 08/21/2019   BILITOT 0.4 08/21/2019   ALKPHOS 98 08/21/2019   AST 22 08/21/2019   ALT 20 08/21/2019   PROT 7.4 08/21/2019   ALBUMIN 4.5 08/21/2019   CALCIUM 9.9 08/21/2019   GFRAA 74 08/21/2019    Speciality Comments: No specialty comments available.  Procedures:  No procedures performed Allergies: Codeine, Minocycline, Contrast media [iodinated diagnostic agents], and Levaquin  [levofloxacin]   Assessment / Plan:     Visit Diagnoses: Primary osteoarthritis of both hands - X-rays were consistent with osteoarthritis. Ultrasound of bilateral hands was unremarkable.  All autoimmune work-up was negative.  Detailed counts regarding osteoarthritis was provided.  Joint protection muscle strengthening was discussed.  Handout on exercises was given.  Dupuytren's contracture of left hand - Patient had injection by Dr. Amedeo Plenty in her left ring finger.  I have advised her to schedule an appointment with Dr. Amedeo Plenty for evaluation.  She may require surgery.  I have also advised her to show the mucinous cyst on her right index finger to him.  S/p bilateral carpal tunnel release - Left median nerve was enlarged to 0.15 cm on ultrasound examination.  Right median nerve was within normal limits.  Trochanteric bursitis of both hips -she has been having increased pain over the left trochanteric bursa.  I offered cortisone injection which she declined.  A handout on exercises was given.  Primary osteoarthritis of both feet - X-ray showed bilateral hallux rigidus.  DDD (degenerative disc disease), lumbar - Is status post surgery in the 80s.  Patient has intermittent pain in the lower back.  Osteopenia of multiple sites-she does not take any supplements.  Calcium rich diet was discussed.  Family history of rheumatoid arthritis - In her mother, and maternal grandmother  History of COVID-19 - July 2020.  Type 2 diabetes mellitus without complication, without long-term current use of insulin ([facility])  Diverticulosis  History of hyperlipidemia  History of gastroesophageal reflux (GERD)  Lichen planus  Orders: No orders of the defined types were placed in this encounter.  No orders of the defined types were placed in this encounter.     Follow-Up Instructions: Return in about 6 months (around 10/02/2020) for Osteoarthritis.   Bo Merino, MD  Note - This record has been  created using Editor, commissioning.  Chart creation errors have been sought, but may not always  have been located. Such creation errors do not reflect on  the standard of medical care.

## 2020-04-02 ENCOUNTER — Ambulatory Visit (INDEPENDENT_AMBULATORY_CARE_PROVIDER_SITE_OTHER): Payer: PPO | Admitting: Rheumatology

## 2020-04-02 ENCOUNTER — Other Ambulatory Visit: Payer: Self-pay

## 2020-04-02 ENCOUNTER — Encounter: Payer: Self-pay | Admitting: Rheumatology

## 2020-04-02 VITALS — BP 123/73 | HR 66 | Resp 14 | Ht 61.0 in | Wt 148.8 lb

## 2020-04-02 DIAGNOSIS — M19042 Primary osteoarthritis, left hand: Secondary | ICD-10-CM

## 2020-04-02 DIAGNOSIS — M5136 Other intervertebral disc degeneration, lumbar region: Secondary | ICD-10-CM

## 2020-04-02 DIAGNOSIS — E119 Type 2 diabetes mellitus without complications: Secondary | ICD-10-CM | POA: Diagnosis not present

## 2020-04-02 DIAGNOSIS — M19071 Primary osteoarthritis, right ankle and foot: Secondary | ICD-10-CM | POA: Diagnosis not present

## 2020-04-02 DIAGNOSIS — M7062 Trochanteric bursitis, left hip: Secondary | ICD-10-CM

## 2020-04-02 DIAGNOSIS — M7061 Trochanteric bursitis, right hip: Secondary | ICD-10-CM | POA: Diagnosis not present

## 2020-04-02 DIAGNOSIS — M8589 Other specified disorders of bone density and structure, multiple sites: Secondary | ICD-10-CM | POA: Diagnosis not present

## 2020-04-02 DIAGNOSIS — Z8261 Family history of arthritis: Secondary | ICD-10-CM | POA: Diagnosis not present

## 2020-04-02 DIAGNOSIS — M19041 Primary osteoarthritis, right hand: Secondary | ICD-10-CM | POA: Diagnosis not present

## 2020-04-02 DIAGNOSIS — M51369 Other intervertebral disc degeneration, lumbar region without mention of lumbar back pain or lower extremity pain: Secondary | ICD-10-CM

## 2020-04-02 DIAGNOSIS — Z8639 Personal history of other endocrine, nutritional and metabolic disease: Secondary | ICD-10-CM

## 2020-04-02 DIAGNOSIS — Z8616 Personal history of COVID-19: Secondary | ICD-10-CM

## 2020-04-02 DIAGNOSIS — Z9889 Other specified postprocedural states: Secondary | ICD-10-CM

## 2020-04-02 DIAGNOSIS — K5790 Diverticulosis of intestine, part unspecified, without perforation or abscess without bleeding: Secondary | ICD-10-CM

## 2020-04-02 DIAGNOSIS — M720 Palmar fascial fibromatosis [Dupuytren]: Secondary | ICD-10-CM

## 2020-04-02 DIAGNOSIS — L439 Lichen planus, unspecified: Secondary | ICD-10-CM

## 2020-04-02 DIAGNOSIS — M19072 Primary osteoarthritis, left ankle and foot: Secondary | ICD-10-CM

## 2020-04-02 DIAGNOSIS — Z8719 Personal history of other diseases of the digestive system: Secondary | ICD-10-CM

## 2020-04-02 NOTE — Patient Instructions (Signed)
Hand Exercises Hand exercises can be helpful for almost anyone. These exercises can strengthen the hands, improve flexibility and movement, and increase blood flow to the hands. These results can make work and daily tasks easier. Hand exercises can be especially helpful for people who have joint pain from arthritis or have nerve damage from overuse (carpal tunnel syndrome). These exercises can also help people who have injured a hand. Exercises Most of these hand exercises are gentle stretching and motion exercises. It is usually safe to do them often throughout the day. Warming up your hands before exercise may help to reduce stiffness. You can do this with gentle massage or by placing your hands in warm water for 10-15 minutes. It is normal to feel some stretching, pulling, tightness, or mild discomfort as you begin new exercises. This will gradually improve. Stop an exercise right away if you feel sudden, severe pain or your pain gets worse. Ask your health care provider which exercises are best for you. Knuckle bend or "claw" fist 1. Stand or sit with your arm, hand, and all five fingers pointed straight up. Make sure to keep your wrist straight during the exercise. 2. Gently bend your fingers down toward your palm until the tips of your fingers are touching the top of your palm. Keep your big knuckle straight and just bend the small knuckles in your fingers. 3. Hold this position for __________ seconds. 4. Straighten (extend) your fingers back to the starting position. Repeat this exercise 5-10 times with each hand. Full finger fist 1. Stand or sit with your arm, hand, and all five fingers pointed straight up. Make sure to keep your wrist straight during the exercise. 2. Gently bend your fingers into your palm until the tips of your fingers are touching the middle of your palm. 3. Hold this position for __________ seconds. 4. Extend your fingers back to the starting position, stretching every  joint fully. Repeat this exercise 5-10 times with each hand. Straight fist 1. Stand or sit with your arm, hand, and all five fingers pointed straight up. Make sure to keep your wrist straight during the exercise. 2. Gently bend your fingers at the big knuckle, where your fingers meet your hand, and the middle knuckle. Keep the knuckle at the tips of your fingers straight and try to touch the bottom of your palm. 3. Hold this position for __________ seconds. 4. Extend your fingers back to the starting position, stretching every joint fully. Repeat this exercise 5-10 times with each hand. Tabletop 1. Stand or sit with your arm, hand, and all five fingers pointed straight up. Make sure to keep your wrist straight during the exercise. 2. Gently bend your fingers at the big knuckle, where your fingers meet your hand, as far down as you can while keeping the small knuckles in your fingers straight. Think of forming a tabletop with your fingers. 3. Hold this position for __________ seconds. 4. Extend your fingers back to the starting position, stretching every joint fully. Repeat this exercise 5-10 times with each hand. Finger spread 1. Place your hand flat on a table with your palm facing down. Make sure your wrist stays straight as you do this exercise. 2. Spread your fingers and thumb apart from each other as far as you can until you feel a gentle stretch. Hold this position for __________ seconds. 3. Bring your fingers and thumb tight together again. Hold this position for __________ seconds. Repeat this exercise 5-10 times with each hand.   Making circles °1. Stand or sit with your arm, hand, and all five fingers pointed straight up. Make sure to keep your wrist straight during the exercise. °2. Make a circle by touching the tip of your thumb to the tip of your index finger. °3. Hold for __________ seconds. Then open your hand wide. °4. Repeat this motion with your thumb and each finger on your  hand. °Repeat this exercise 5-10 times with each hand. °Thumb motion °1. Sit with your forearm resting on a table and your wrist straight. Your thumb should be facing up toward the ceiling. Keep your fingers relaxed as you move your thumb. °2. Lift your thumb up as high as you can toward the ceiling. Hold for __________ seconds. °3. Bend your thumb across your palm as far as you can, reaching the tip of your thumb for the small finger (pinkie) side of your palm. Hold for __________ seconds. °Repeat this exercise 5-10 times with each hand. °Grip strengthening ° °1. Hold a stress ball or other soft ball in the middle of your hand. °2. Slowly increase the pressure, squeezing the ball as much as you can without causing pain. Think of bringing the tips of your fingers into the middle of your palm. All of your finger joints should bend when doing this exercise. °3. Hold your squeeze for __________ seconds, then relax. °Repeat this exercise 5-10 times with each hand. °Contact a health care provider if: °· Your hand pain or discomfort gets much worse when you do an exercise. °· Your hand pain or discomfort does not improve within 2 hours after you exercise. °If you have any of these problems, stop doing these exercises right away. Do not do them again unless your health care provider says that you can. °Get help right away if: °· You develop sudden, severe hand pain or swelling. If this happens, stop doing these exercises right away. Do not do them again unless your health care provider says that you can. °This information is not intended to replace advice given to you by your health care provider. Make sure you discuss any questions you have with your health care provider. °Document Revised: 02/01/2019 Document Reviewed: 10/12/2018 °Elsevier Patient Education © 2020 [employer] °Iliotibial Band Syndrome Rehab °Ask your health care provider which exercises are safe for you. Do exercises exactly as told by your health care  provider and adjust them as directed. It is normal to feel mild stretching, pulling, tightness, or discomfort as you do these exercises. Stop right away if you feel sudden pain or your pain gets significantly worse. Do not begin these exercises until told by your health care provider. °Stretching and range-of-motion exercises °These exercises warm up your muscles and joints and improve the movement and flexibility of your hip and pelvis. °Quadriceps stretch, prone ° °1. Lie on your abdomen on a firm surface, such as a bed or padded floor (prone position). °2. Bend your left / right knee and reach back to hold your ankle or pant leg. If you cannot reach your ankle or pant leg, loop a belt around your foot and grab the belt instead. °3. Gently pull your heel toward your buttocks. Your knee should not slide out to the side. You should feel a stretch in the front of your thigh and knee (quadriceps). °4. Hold this position for __________ seconds. °Repeat __________ times. Complete this exercise __________ times a day. °Iliotibial band stretch °An iliotibial band is a strong band of muscle tissue that   runs from the outer side of your hip to the outer side of your thigh and knee. °1. Lie on your side with your left / right leg in the top position. °2. Bend both of your knees and grab your left / right ankle. Stretch out your bottom arm to help you balance. °3. Slowly bring your top knee back so your thigh goes behind your trunk. °4. Slowly lower your top leg toward the floor until you feel a gentle stretch on the outside of your left / right hip and thigh. If you do not feel a stretch and your knee will not fall farther, place the heel of your other foot on top of your knee and pull your knee down toward the floor with your foot. °5. Hold this position for __________ seconds. °Repeat __________ times. Complete this exercise __________ times a day. °Strengthening exercises °These exercises build strength and endurance in  your hip and pelvis. Endurance is the ability to use your muscles for a long time, even after they get tired. °Straight leg raises, side-lying °This exercise strengthens the muscles that rotate the leg at the hip and move it away from your body (hip abductors). °1. Lie on your side with your left / right leg in the top position. Lie so your head, shoulder, hip, and knee line up. You may bend your bottom knee to help you balance. °2. Roll your hips slightly forward so your hips are stacked directly over each other and your left / right knee is facing forward. °3. Tense the muscles in your outer thigh and lift your top leg 4-6 inches (10-15 cm). °4. Hold this position for __________ seconds. °5. Slowly return to the starting position. Let your muscles relax completely before doing another repetition. °Repeat __________ times. Complete this exercise __________ times a day. °Leg raises, prone °This exercise strengthens the muscles that move the hips (hip extensors). °1. Lie on your abdomen on your bed or a firm surface. You can put a pillow under your hips if that is more comfortable for your lower back. °2. Bend your left / right knee so your foot is straight up in the air. °3. Squeeze your buttocks muscles and lift your left / right thigh off the bed. Do not let your back arch. °4. Tense your thigh muscle as hard as you can without increasing any knee pain. °5. Hold this position for __________ seconds. °6. Slowly lower your leg to the starting position and allow it to relax completely. °Repeat __________ times. Complete this exercise __________ times a day. °Hip hike °1. Stand sideways on a bottom step. Stand on your left / right leg with your other foot unsupported next to the step. You can hold on to the railing or wall for balance if needed. °2. Keep your knees straight and your torso square. Then lift your left / right hip up toward the ceiling. °3. Slowly let your left / right hip lower toward the floor, past  the starting position. Your foot should get closer to the floor. Do not lean or bend your knees. °Repeat __________ times. Complete this exercise __________ times a day. °This information is not intended to replace advice given to you by your health care provider. Make sure you discuss any questions you have with your health care provider. °Document Revised: 02/01/2019 Document Reviewed: 08/02/2018 °Elsevier Patient Education © 2020 [employer] ° °

## 2020-04-10 ENCOUNTER — Encounter: Payer: Self-pay | Admitting: Physician Assistant

## 2020-04-10 ENCOUNTER — Ambulatory Visit (INDEPENDENT_AMBULATORY_CARE_PROVIDER_SITE_OTHER): Payer: PPO | Admitting: Physician Assistant

## 2020-04-10 ENCOUNTER — Other Ambulatory Visit: Payer: Self-pay

## 2020-04-10 DIAGNOSIS — D1801 Hemangioma of skin and subcutaneous tissue: Secondary | ICD-10-CM | POA: Diagnosis not present

## 2020-04-10 DIAGNOSIS — L821 Other seborrheic keratosis: Secondary | ICD-10-CM | POA: Diagnosis not present

## 2020-04-10 DIAGNOSIS — L570 Actinic keratosis: Secondary | ICD-10-CM | POA: Diagnosis not present

## 2020-04-10 DIAGNOSIS — L439 Lichen planus, unspecified: Secondary | ICD-10-CM | POA: Diagnosis not present

## 2020-04-10 DIAGNOSIS — Z1283 Encounter for screening for malignant neoplasm of skin: Secondary | ICD-10-CM

## 2020-04-10 DIAGNOSIS — L814 Other melanin hyperpigmentation: Secondary | ICD-10-CM

## 2020-04-10 DIAGNOSIS — L578 Other skin changes due to chronic exposure to nonionizing radiation: Secondary | ICD-10-CM | POA: Diagnosis not present

## 2020-04-10 MED ORDER — CLOBETASOL PROPIONATE 0.05 % EX OINT: 1.0000 "application " | TOPICAL_OINTMENT | Freq: Every evening | CUTANEOUS | 4 refills | Status: DC

## 2020-04-11 ENCOUNTER — Encounter: Payer: Self-pay | Admitting: Physician Assistant

## 2020-04-11 NOTE — Progress Notes (Signed)
   Follow-Up Visit   Subjective  Diana Roach is a 69 y.o. female who presents for the following: Follow-up (FOLLOW UP HAIR LOSS MUCH BETTER, LP ) and Skin Problem (LP ON LEGS LAST YEAR INJECTIONS HELP).  The following portions of the chart were reviewed this encounter and updated as appropriate: Tobacco  Allergies  Meds  Problems  Med Hx  Surg Hx  Fam Hx      Objective  Well appearing patient in no apparent distress; mood and affect are within normal limits.  A full examination was performed including scalp, head, eyes, ears, nose, lips, neck, chest, axillae, abdomen, back, buttocks, bilateral upper extremities, bilateral lower extremities, hands, feet, fingers, toes, fingernails, and toenails. All findings within normal limits unless otherwise noted below.  Objective  Left Lower Leg - Anterior, Right Lower Leg - Anterior: Small plaques  Objective  Head to toe: No Atypical nevi or signs of NMSC  Objective  Mid Back: Erythematous patches with gritty scale.   Assessment & Plan  Lichen planus (2) Left Lower Leg - Anterior; Right Lower Leg - Anterior  Screening exam for skin cancer Head to toe  Yearly skin examinations  AK (actinic keratosis) Mid Back  Destruction of lesion - Mid Back Complexity: simple   Destruction method: cryotherapy   Informed consent: discussed and consent obtained   Timeout:  patient name, date of birth, surgical site, and procedure verified Lesion destroyed using liquid nitrogen: Yes   Cryotherapy cycles:  1 Outcome: patient tolerated procedure well with no complications   Post-procedure details: wound care instructions given    Lentigines - Scattered tan macules - Discussed due to sun exposure - Benign, observe - Call for any changes  Seborrheic Keratoses - Stuck-on, waxy, tan-brown papules and plaques  - Discussed benign etiology and prognosis. - Observe - Call for any changes  Hemangiomas - Red papules - Discussed  benign nature - Observe - Call for any changes  Actinic Damage - diffuse scaly erythematous macules with underlying dyspigmentation - Recommend daily broad spectrum sunscreen SPF 30+ to sun-exposed areas, reapply every 2 hours as needed.  - Call for new or changing lesions.  Skin cancer screening performed today.

## 2020-04-16 DIAGNOSIS — M65342 Trigger finger, left ring finger: Secondary | ICD-10-CM | POA: Diagnosis not present

## 2020-04-16 DIAGNOSIS — M79642 Pain in left hand: Secondary | ICD-10-CM | POA: Diagnosis not present

## 2020-04-16 DIAGNOSIS — R2231 Localized swelling, mass and lump, right upper limb: Secondary | ICD-10-CM | POA: Diagnosis not present

## 2020-04-16 DIAGNOSIS — M65332 Trigger finger, left middle finger: Secondary | ICD-10-CM | POA: Diagnosis not present

## 2020-04-29 ENCOUNTER — Ambulatory Visit
Admission: RE | Admit: 2020-04-29 | Discharge: 2020-04-29 | Disposition: A | Discharge status: Home / Self Care | Payer: PPO | Source: Ambulatory Visit | Attending: Family Medicine | Admitting: Family Medicine

## 2020-04-29 ENCOUNTER — Other Ambulatory Visit: Payer: Self-pay

## 2020-04-29 DIAGNOSIS — Z1231 Encounter for screening mammogram for malignant neoplasm of breast: Secondary | ICD-10-CM

## 2020-05-14 DIAGNOSIS — H04123 Dry eye syndrome of bilateral lacrimal glands: Secondary | ICD-10-CM | POA: Diagnosis not present

## 2020-05-14 DIAGNOSIS — H26493 Other secondary cataract, bilateral: Secondary | ICD-10-CM | POA: Diagnosis not present

## 2020-05-14 DIAGNOSIS — E119 Type 2 diabetes mellitus without complications: Secondary | ICD-10-CM | POA: Diagnosis not present

## 2020-05-14 DIAGNOSIS — Z961 Presence of intraocular lens: Secondary | ICD-10-CM | POA: Diagnosis not present

## 2020-05-14 DIAGNOSIS — H43812 Vitreous degeneration, left eye: Secondary | ICD-10-CM | POA: Diagnosis not present

## 2020-05-19 DIAGNOSIS — Z4789 Encounter for other orthopedic aftercare: Secondary | ICD-10-CM | POA: Diagnosis not present

## 2020-05-19 DIAGNOSIS — M65342 Trigger finger, left ring finger: Secondary | ICD-10-CM | POA: Diagnosis not present

## 2020-05-19 DIAGNOSIS — M65332 Trigger finger, left middle finger: Secondary | ICD-10-CM | POA: Diagnosis not present

## 2020-05-20 ENCOUNTER — Other Ambulatory Visit: Payer: Self-pay

## 2020-06-03 DIAGNOSIS — M79642 Pain in left hand: Secondary | ICD-10-CM | POA: Diagnosis not present

## 2020-06-12 DIAGNOSIS — M79642 Pain in left hand: Secondary | ICD-10-CM | POA: Diagnosis not present

## 2020-07-02 ENCOUNTER — Other Ambulatory Visit: Payer: Self-pay | Admitting: Family Medicine

## 2020-07-03 NOTE — Telephone Encounter (Signed)
Appointment made.  .Miarose Lippert R Abrie Egloff, CMA  

## 2020-07-03 NOTE — Telephone Encounter (Signed)
Please ask patient to schedule a follow up visit with me. Past due for follow up on diabetes. Thanks, Leeanne Rio, MD

## 2020-07-10 ENCOUNTER — Encounter: Payer: Self-pay | Admitting: Family Medicine

## 2020-07-10 ENCOUNTER — Ambulatory Visit (INDEPENDENT_AMBULATORY_CARE_PROVIDER_SITE_OTHER): Payer: PPO | Admitting: Family Medicine

## 2020-07-10 ENCOUNTER — Other Ambulatory Visit: Payer: Self-pay

## 2020-07-10 VITALS — BP 130/82 | HR 60 | Wt 145.6 lb

## 2020-07-10 DIAGNOSIS — E119 Type 2 diabetes mellitus without complications: Secondary | ICD-10-CM

## 2020-07-10 DIAGNOSIS — J4540 Moderate persistent asthma, uncomplicated: Secondary | ICD-10-CM

## 2020-07-10 DIAGNOSIS — Z23 Encounter for immunization: Secondary | ICD-10-CM | POA: Diagnosis not present

## 2020-07-10 DIAGNOSIS — M8580 Other specified disorders of bone density and structure, unspecified site: Secondary | ICD-10-CM

## 2020-07-10 LAB — POCT GLYCOSYLATED HEMOGLOBIN (HGB A1C): HbA1c, POC (controlled diabetic range): 6.6 % (ref 0.0–7.0)

## 2020-07-10 MED ORDER — FLUTICASONE PROPIONATE HFA 110 MCG/ACT IN AERO
2.0000 | INHALATION_SPRAY | Freq: Two times a day (BID) | RESPIRATORY_TRACT | Status: DC
Start: 2020-07-10 — End: 2020-07-16

## 2020-07-10 NOTE — Patient Instructions (Addendum)
It was great to see you again today!  Come back in 3 weeks for second COVID vaccine and to see how you are doing with your inhalers  Take claritin daily  Checking vitamin D level today Referring to ENT for the sore in your mouth  Go up to 2 puffs twice daily on your flovent inhaler  Follow up with me in 3 months, sooner if needed  Be well, [name]

## 2020-07-10 NOTE — Progress Notes (Signed)
   Covid-19 Vaccination Clinic  Name:  Diana Roach    MRN: [id number] DOB: Apr 13, 1951  07/10/2020  Diana Roach was observed post Covid-19 immunization for 15 minutes without incident. She was provided with Vaccine Information Sheet and instruction to access the V-Safe system.   Diana Roach was instructed to call 911 with any severe reactions post vaccine: Marland Kitchen Difficulty breathing  . Swelling of face and throat  . A fast heartbeat  . A bad rash all over body  . Dizziness and weakness

## 2020-07-11 ENCOUNTER — Encounter: Payer: Self-pay | Admitting: Family Medicine

## 2020-07-11 LAB — MICROALBUMIN / CREATININE URINE RATIO
Creatinine, Urine: 58.3 mg/dL
Microalb/Creat Ratio: <5 mg/g creat (ref 0–29)
Microalbumin, Urine: <3 ug/mL

## 2020-07-11 LAB — VITAMIN D 25 HYDROXY (VIT D DEFICIENCY, FRACTURES): Vit D, 25-Hydroxy: 31 ng/mL (ref 30.0–100.0)

## 2020-07-11 NOTE — Assessment & Plan Note (Signed)
Patient reports taking 1,000 IU vitamin D3, generally daily, though sometimes forgets.  Was unable to check Vit D at previous visit, due to COVID testing -Vit D level today

## 2020-07-11 NOTE — Progress Notes (Signed)
SUBJECTIVE:  Here for f/u  CHIEF COMPLAINT / HPI:   Asthma  Patient reports a decline in her breathing quality since her hospitalization last year for acute hypoxic respiratory failure 2/2 COVID; with notably increasing SOB. Additionally, reports needing to use albuterol more often since Aug (once a week), which she endorses was atypical for her. She is on Flovent 1 puff bid, and reports usually compliant. Patient has hx of allergies, and conveys as worsening during fall. She notes that Claritin provides some breathing relief when she takes it regularly. She has been taking Claritin sporadically in the past two weeks.  Reports shortness of breath with little activity like bending down to pet dog. Sometimes with wheezing, sometimes not. Patient reports daily productive chronic cough of several months duration. Denies sick contacts, fevers and chills. She quit smoking years ago but had a 31 pack years hx of smoking prior to that [started at age 31, at 1 ppd; quit in 1996].   Diabetes Patient not on any medications. Admits to indulging at times, but has been working on increasing fruit and vegetable intake.   Medications Patient reports taking 1,000 IU vitamin D3, generally daily, though sometimes forgets. Takes Goody Powder 3-4 times per month for chronic generalized pain.   Mouth sore Patient reports a persistent mouth sore in R lower posterior gum line. 31 pack year hx. She believes it might be due to older dentures.    OBJECTIVE:   BP 130/82   Pulse 60   Wt 145 lb 9.6 oz (66 kg)   SpO2 97%   BMI 27.51 kg/m   General: Well appearing, No acute distress. Pleasant and cooperative. HEENT: whitish small ?ulcerated lesion in R lower posterior mouth overlying mandible Pulmonary: Speaking in coherent sentences without inappropriate pause Neuro: Alert and responsive. Diabetic foot exam: 2+ DP pulses bilat, slight decrease in sensation with monofilament testing bilaterally on balls of feet.  No lesions or significant calluses.    ASSESSMENT/PLAN:   HM -COVID Vaccine: Received first dose today.  Patient had some concerns about need for vaccine given previously had COVID; and about vaccine components. Advised accordingly. She decided to get vaccinated. Follow up in 3 weeks for second dose -also discussed flu vaccine, patient prefers to wait on this since she is getting COVID vaccine today -requested records for eye exam -microalbumin/creatinine ratio today -received Tdap Jan 2021 at pharmacy per patient, entered into immunization record in [facility] - foot exam today with mildly decreased sensation of bilateral feet, advised examining feet daily to monitor for cuts/sores  Asthma, moderate persistent Potential for COPD in this formerly 31 pack years patient with new-onset chronic, productive cough; and notably increasing shortness of breath over time in context of substantial respiratory insult necessitating hospitalization from [place] last year. Patient does have known hx of asthma and seasonal allergies, which are likely strong contributors. Notes some relief with Claritin use. Patient notably has not gotten COVID vaccine yet due to personal concerns.  -Increased Flovent dose to 2 puffs twice daily (257mcg twice daily) -Patient advised to use Claritin 10 mg daily -Patient advised on COVID vaccine, got first dose today -Monitor for symptom improvement on aforementioned medication adjustments; and with seasonal changes. Consider pursuing PFTs if no improvement to symtpoms, once patient fully vaccinated against COVID.   Osteopenia Patient reports taking 1,000 IU vitamin D3, generally daily, though sometimes forgets.  Was unable to check Vit D at previous visit, due to COVID testing -Vit D level today  Type 2 diabetes mellitus without complication, without long-term current use of insulin ([place]) Remains well controlled with diet alone. Continue present plan.    Mouth Sore Given  smoking history and persistent lesion in mouth, refer to ENT for evaluation, may warrant biopsy. Referral entered.   Advised against routine goody powder use  Diana Roach   Patient seen along with MS3 student Romeo Apple. I personally evaluated this patient along with the student, and verified all aspects of the history, physical exam, and medical decision making as documented by the student. I agree with the student's documentation and have made all necessary edits.  Chrisandra Netters, MD  [place]

## 2020-07-11 NOTE — Assessment & Plan Note (Addendum)
Potential for COPD in this formerly 31 pack years patient with new-onset chronic, productive cough; and notably increasing shortness of breath over time in context of substantial respiratory insult necessitating hospitalization from [place] last year. Patient does have known hx of asthma and seasonal allergies, which are likely strong contributors. Notes some relief with Claritin use. Patient notably has not gotten COVID vaccine yet due to personal concerns.  -Increased Flovent dose to 2 puffs twice daily (28mcg twice daily) -Patient advised to use Claritin 10 mg daily -Patient advised on COVID vaccine, got first dose today -Monitor for symptom improvement on aforementioned medication adjustments; and with seasonal changes. Consider pursuing PFTs if no improvement to symtpoms, once patient fully vaccinated against COVID.

## 2020-07-14 NOTE — Assessment & Plan Note (Signed)
Remains well controlled with diet alone. Continue present plan.

## 2020-07-16 ENCOUNTER — Other Ambulatory Visit: Payer: Self-pay | Admitting: Family Medicine

## 2020-07-31 ENCOUNTER — Ambulatory Visit (INDEPENDENT_AMBULATORY_CARE_PROVIDER_SITE_OTHER): Payer: PPO

## 2020-07-31 ENCOUNTER — Other Ambulatory Visit: Payer: Self-pay

## 2020-07-31 ENCOUNTER — Ambulatory Visit: Payer: PPO

## 2020-07-31 DIAGNOSIS — Z23 Encounter for immunization: Secondary | ICD-10-CM | POA: Diagnosis not present

## 2020-08-05 NOTE — Progress Notes (Signed)
   Covid-19 Vaccination Clinic  Name:  Diana Roach    MRN: [id number] DOB: Dec 11, 1950  07/31/2020  Patient presents to nurse clinic for second [place] vaccination. Patient answers no to all screening questions and denies history of previous allergic reaction to vaccine. Administered in LD, site unremarkable, tolerated injection well.   Ms. Lizana was observed post Covid-19 immunization for 15 minutes without incident. She was provided with Vaccine Information Sheet and instruction to access the V-Safe system.   Ms. Shere was instructed to call 911 with any severe reactions post vaccine: Marland Kitchen Difficulty breathing  . Swelling of face and throat  . A fast heartbeat  . A bad rash all over body  . Dizziness and weakness  Provided patient with updated immunization record and card.   Talbot Grumbling, RN

## 2020-08-09 IMAGING — DX PORTABLE CHEST - 1 VIEW
1 series · 1 of 1 positions shown · non-contrast
Comparison: June 16, 2018

CLINICAL DATA: Shortness of breath

EXAM:
PORTABLE CHEST 1 VIEW

[chest ap]
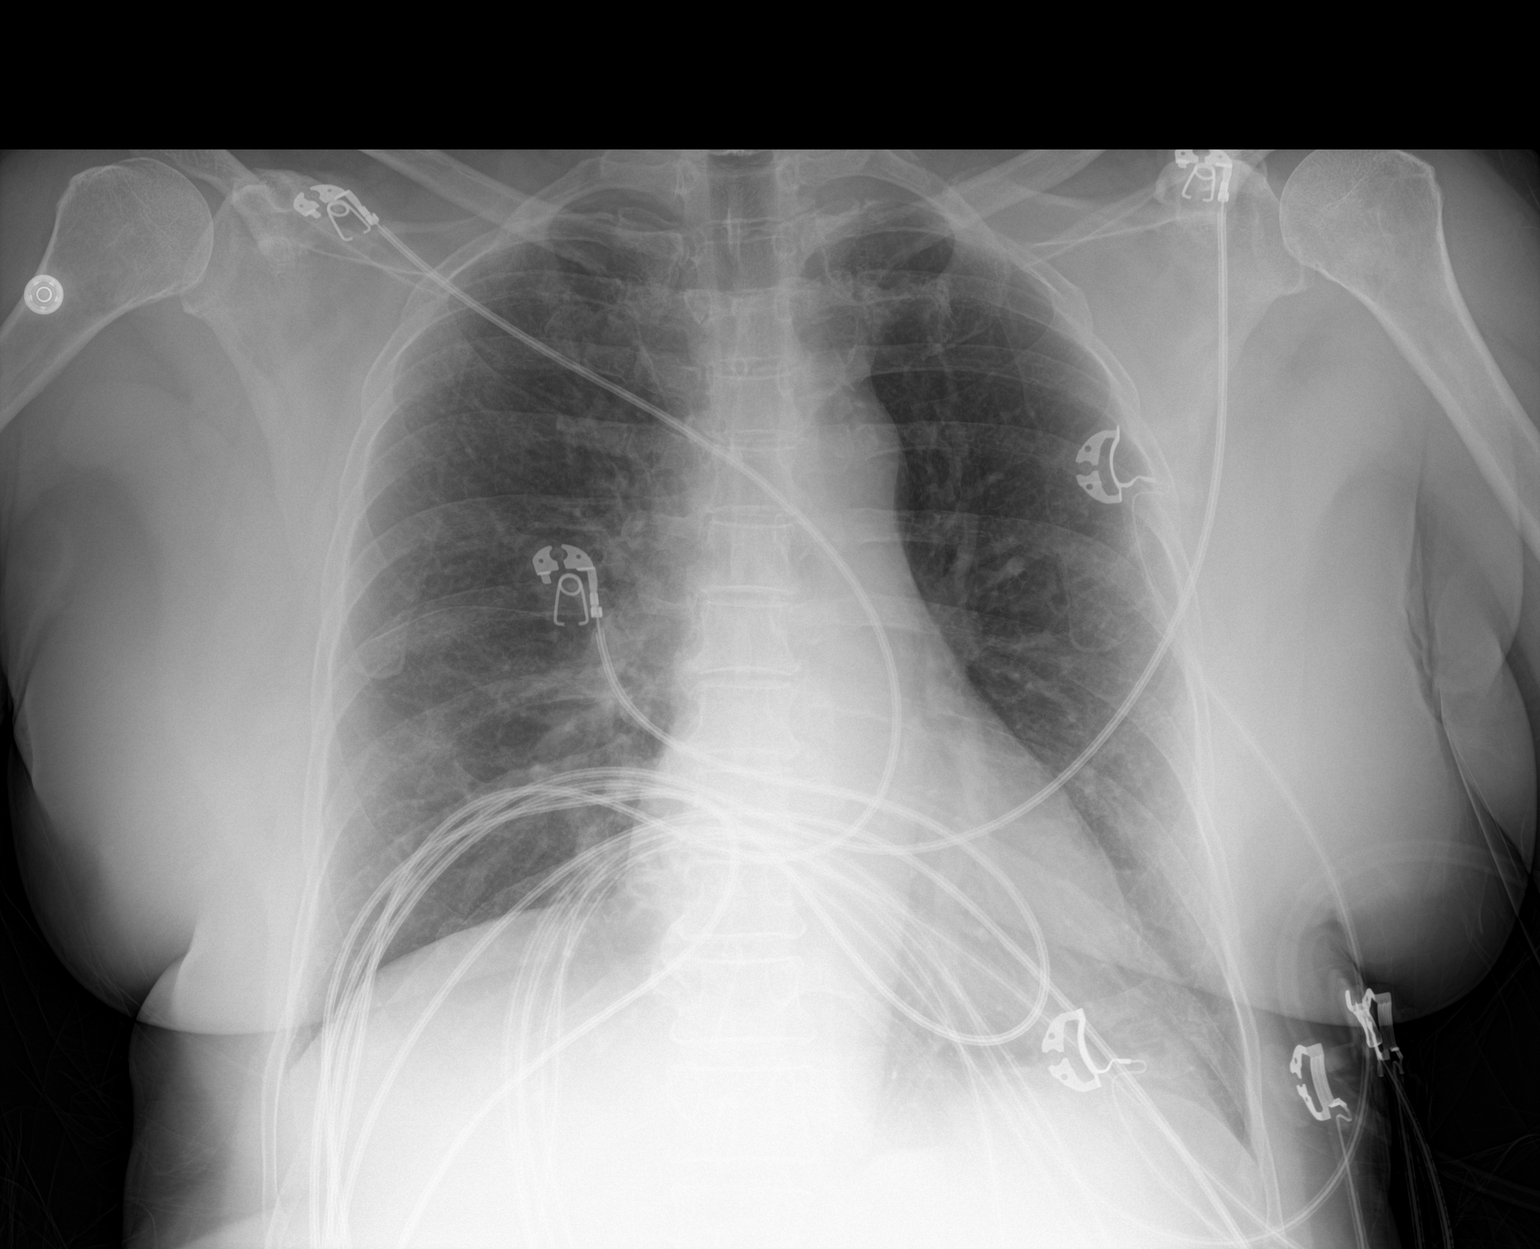

[1 of 1 positions shown; findings below may reference images not displayed]

FINDINGS: The heart size is stable. There are new subtle bilateral hazy
airspace opacities. There is no pneumothorax. No large pleural
effusion. No acute osseous abnormality.
IMPRESSION: New subtle bilateral hazy airspace opacities which can be seen in
patients with an atypical pneumonia.

## 2020-08-12 ENCOUNTER — Other Ambulatory Visit: Payer: Self-pay

## 2020-08-12 ENCOUNTER — Encounter: Payer: Self-pay | Admitting: Family Medicine

## 2020-08-12 ENCOUNTER — Ambulatory Visit (INDEPENDENT_AMBULATORY_CARE_PROVIDER_SITE_OTHER): Payer: PPO | Admitting: Family Medicine

## 2020-08-12 VITALS — BP 110/62 | HR 75 | Wt 147.2 lb

## 2020-08-12 DIAGNOSIS — K1370 Unspecified lesions of oral mucosa: Secondary | ICD-10-CM

## 2020-08-12 DIAGNOSIS — J4540 Moderate persistent asthma, uncomplicated: Secondary | ICD-10-CM

## 2020-08-12 DIAGNOSIS — Z23 Encounter for immunization: Secondary | ICD-10-CM | POA: Diagnosis not present

## 2020-08-12 NOTE — Patient Instructions (Signed)
It was great to see you again today!  Stay on current flovent dose  Referring to ENT  Schedule pulmonary function testing with Dr. Valentina Lucks  Be well, [name]

## 2020-08-12 NOTE — Progress Notes (Addendum)
    SUBJECTIVE:   CHIEF COMPLAINT / HPI:  #Asthma: Ms. Diana Roach is a 69yo female with a history of asthma and T2DM who presents for a follow-up appointment after increasing her Flovent dose one month ago. She has had difficulty remembering to take 2 puffs rather than one, but reports improvement of her symptoms. Takes 2 puffs twice daily about 3-4 days per week, other days just does 1 puff twice daily. She has not needed albuterol at all in the past month. Denies any cough or wheezing, but reports that she still becomes short of breath with exertion (I.e. carrying groceries or doing heavy work around the house.) She is now fully vaccinated against COVID-19 and is interested in having PFTs done in our clinic.   #Mouth Sore: Ms. Bo has a persistent sore of her R posterior gum line and has more recently noticed a similar lesion developing on the L side. She attributes this to her older dentures which cause her pain in this area.   #Health Maintenance: She would like her flu shot today.    OBJECTIVE:   BP 110/62   Pulse 75   Wt 147 lb 3.2 oz (66.8 kg)   SpO2 97%   BMI 27.81 kg/m   Physical Exam Vitals reviewed.  Constitutional:      General: She is not in acute distress. HENT:     Mouth/Throat:     Comments: 1-2 mm white ulcerative lesion on R lower gum line. Similar area on L, but less pronounced. Cardiovascular:     Rate and Rhythm: Normal rate and regular rhythm.     Heart sounds: Normal heart sounds. No murmur heard.  No friction rub. No gallop.   Pulmonary:     Comments: Normal work of breathing on room air. No wheezes, rales, or rhonchi noted.  Neurological:     Mental Status: She is alert.  Psychiatric:        Mood and Affect: Mood and affect normal.    ASSESSMENT/PLAN:   Asthma, moderate persistent Patient doing well on increased Flovent dose. Has not required albuterol at all in the past month. Is interested in having PFTs done in our clinic. - Continue  current medication regimen. - Will schedule with Dr. Valentina Lucks for PFTs.    Mouth Sore: As patient indicated, these are most likely related to poorly-fitting dentures. However, in the setting of her 31 year smoking history, she would benefit from evaluation by ENT to rule out cancer, may need biopsy. - ENT referral placed.   Health Maintenance: Flu vaccine administered in clinic.   Pearla Dubonnet, [place]   Patient seen along with MS3 student Pearla Dubonnet. I personally evaluated this patient along with the student, and verified all aspects of the history, physical exam, and medical decision making as documented by the student. I agree with the student's documentation and have made all necessary edits.  Chrisandra Netters, MD  [place]

## 2020-08-12 NOTE — Assessment & Plan Note (Addendum)
Patient doing well on increased Flovent dose. Has not required albuterol at all in the past month. Is interested in having PFTs done in our clinic. - Continue current medication regimen. - Will schedule with Dr. Valentina Lucks for PFTs.

## 2020-08-14 ENCOUNTER — Encounter: Payer: Self-pay | Admitting: Pharmacist

## 2020-08-14 ENCOUNTER — Other Ambulatory Visit: Payer: Self-pay

## 2020-08-14 ENCOUNTER — Ambulatory Visit (INDEPENDENT_AMBULATORY_CARE_PROVIDER_SITE_OTHER): Payer: PPO | Admitting: Pharmacist

## 2020-08-14 DIAGNOSIS — J4540 Moderate persistent asthma, uncomplicated: Secondary | ICD-10-CM

## 2020-08-14 LAB — PULMONARY FUNCTION TEST

## 2020-08-14 MED ORDER — FLOVENT HFA 110 MCG/ACT IN AERO: 2.0000 | INHALATION_SPRAY | Freq: Two times a day (BID) | RESPIRATORY_TRACT | 0 refills | Status: DC

## 2020-08-14 NOTE — Assessment & Plan Note (Signed)
Patient reports improvement in breathing with Flovent HFA 110 mcg 2 puffs BID, however is not adherent as she is using 1 puff BID majority of the time. Reports reduced exacerbations and infrequent use of rescue inhaler. Spirometry evaluation reveals mild restrictive lung disease. Post-nebulized albuterol treatment revealed reversible lung function with a 17.3% change in FEF25/75 and also 2% increase in FEV1. Discussed the importance of medication adherence and taking Flovent as prescribed. Counseled on proper inhaler technique with MDI inhaler utilizing the teach-back method.  -Continued Flovent  (fluticasone) 110 mcg - 2 puffs twice daily -Continued Albuterol PRN If dyspnea remains problematic consider change to LABA/Steroid - for example BREO (fluticasone/vilanterol) 263mcg Daily.  -Educated patient on purpose, proper use, potential adverse effects including Flovent risk of esophageal candidiasis and need to rinse mouth after each use.

## 2020-08-14 NOTE — Patient Instructions (Signed)
Nice to meet you today.   Remember to take TWO puffs twice daily of Flovent.   Follow-up with [name].

## 2020-08-14 NOTE — Progress Notes (Signed)
   S:    Patient arrives in good spirits. Presents for lung function evaluation.   Patient was referred and last seen by Primary Care Provider on 08/12/20.   Patient reports she often takes Flovent 110 mcg 1 puff twice daily instead of 2 puffs twice daily as prescribed. Reports improvements in breathing with 2 puffs twice daily. Reports not using albuterol in the last 3 weeks. Reports occasional SOB with exertion (playing with dog).  Patient reports last dose of asthma medications was this morning at 7AM [facility]) Current asthma medications: Albuterol PRN, Flovent 110 mcg - 2 puffs BID Rescue inhaler use frequency: previously once a day   Level of asthma sx control- in the last 4 weeks: Question Scoring Patient Score  Daytime sx more than twice a week Yes (1) No (0) Yes  Any nighttime waking due to asthma Yes (1) No (0) No  Reliever needed >2x/week Yes (1) No (0) Yes  Any activity limitation due to asthma Yes (1) No (0) No   Total Score 2  Well controlled - 0, partly controlled - 1-2, uncontrolled 3-4  O: Physical Exam Vitals reviewed.  Constitutional:      Appearance: Normal appearance.  Pulmonary:     Effort: Pulmonary effort is normal.  Neurological:     Mental Status: She is alert.  Psychiatric:        Mood and Affect: Mood normal.        Thought Content: Thought content normal.    Review of Systems  Respiratory: Negative.   All other systems reviewed and are negative.   Vitals:   08/14/20 1131  BP: 128/80  Pulse: 68  SpO2: 97%   See "scanned report" or Documentation Flowsheet (discrete results - PFTs) for  Spirometry results. Patient provided good effort while attempting spirometry.   Lung Age = 34 Albuterol Neb  Lot# [id number]   Exp. 12/21  PHQ-9 Score: 0  A/P: Patient reports improvement in breathing with Flovent HFA 110 mcg 2 puffs BID, however is not adherent as she is using 1 puff BID majority of the time. Reports reduced exacerbations and infrequent  use of rescue inhaler. Spirometry evaluation reveals mild restrictive lung disease. Post-nebulized albuterol treatment revealed reversible lung function with a 17.3% change in FEF25/75 and also 2% increase in FEV1. Discussed the importance of medication adherence and taking Flovent as prescribed. Counseled on proper inhaler technique with MDI inhaler utilizing the teach-back method.  -Continued Flovent  (fluticasone) 110 mcg - 2 puffs twice daily -Continued Albuterol PRN If dyspnea remains problematic consider change to LABA/Steroid - for example BREO (fluticasone/vilanterol) 291mcg Daily.  -Educated patient on purpose, proper use, potential adverse effects including Flovent risk of esophageal candidiasis and need to rinse mouth after each use.  -Reviewed results of pulmonary function tests.   Patient verbalized understanding of results and education.  Written pt instructions provided.   Total time in face to face counseling 45 minutes.  Patient seen with Mercy Riding, PharmD - PGY-1 Resident and Lorel Monaco, PharmD, PGY2 Pharmacy Resident.

## 2020-08-14 NOTE — Progress Notes (Signed)
Reviewed: I agree with Dr. Koval's documentation and management. 

## 2020-08-21 IMAGING — DX PORTABLE CHEST - 1 VIEW
1 series · 1 of 1 positions shown · non-contrast
Comparison: May 25, 2019

CLINICAL DATA: EKNGB-DB positive.  Shortness of breath.

EXAM:
PORTABLE CHEST 1 VIEW

[chest]
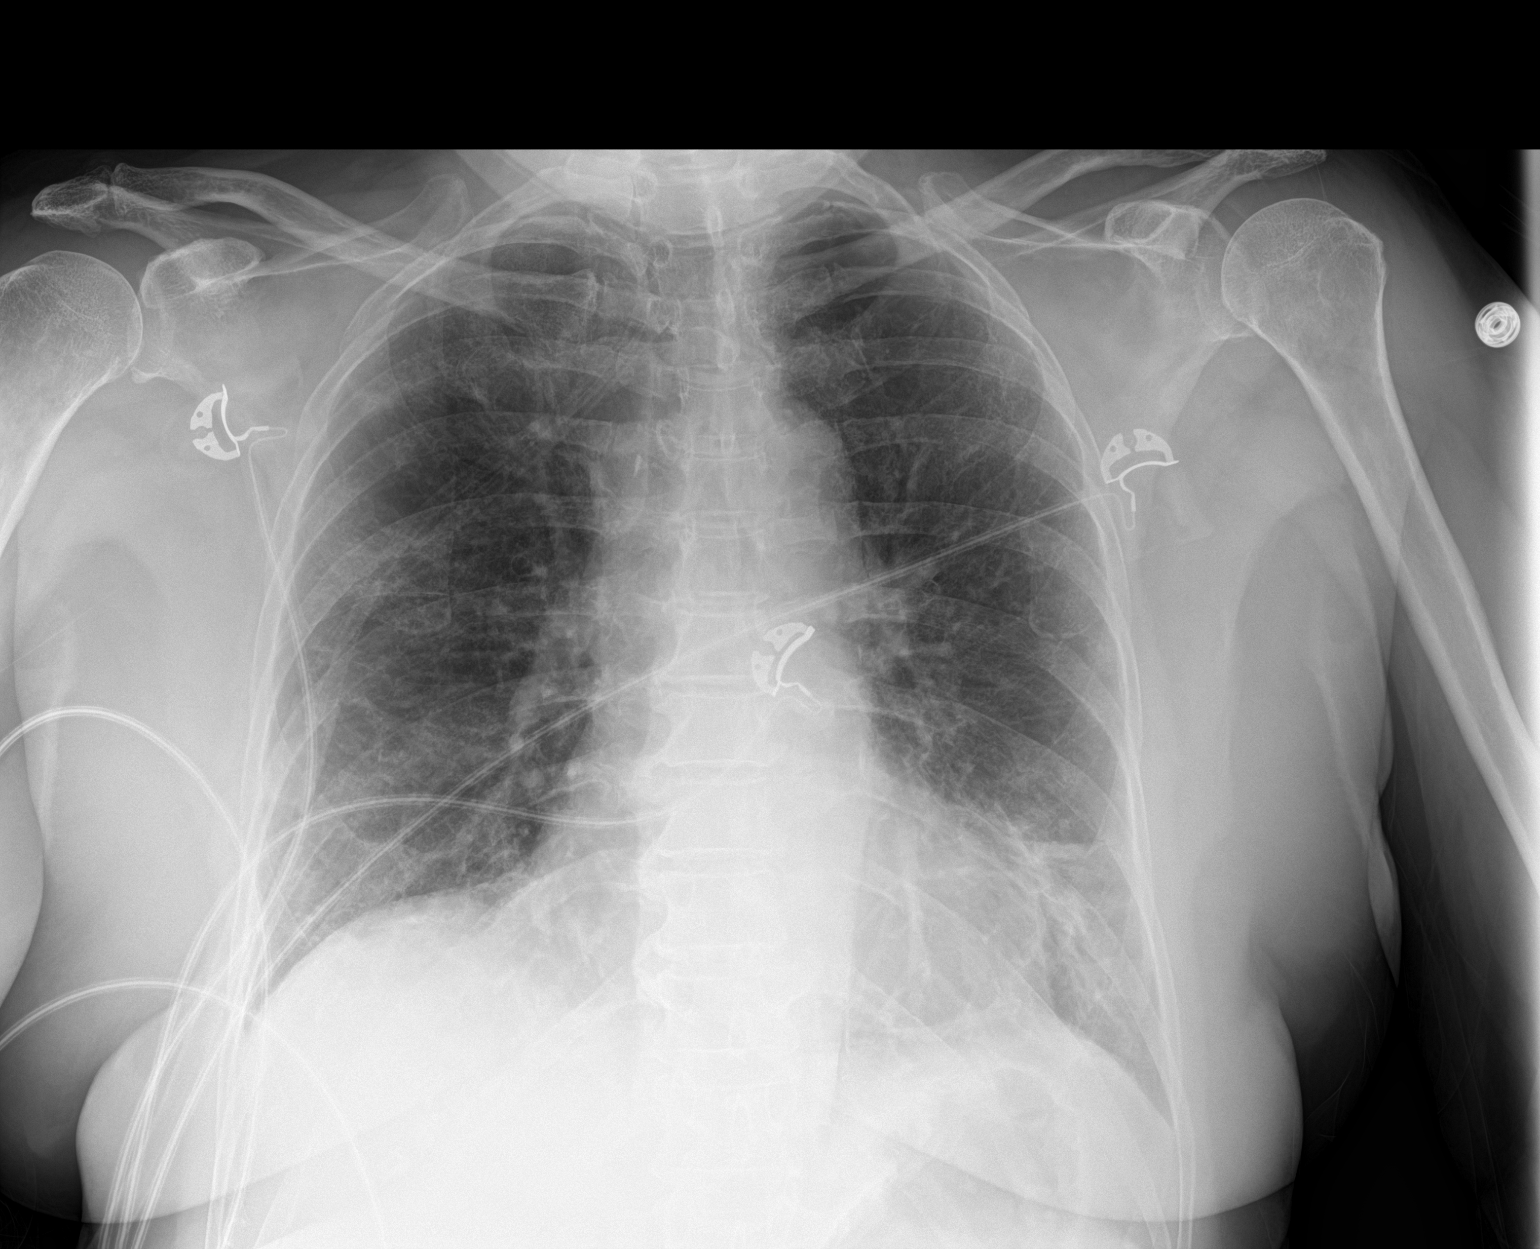

[1 of 1 positions shown; findings below may reference images not displayed]

FINDINGS: The heart, hila, and mediastinum are unremarkable. No pneumothorax.
Decreased pulmonary opacities. Minimal opacity remains in the
lateral right upper lobe and the right base. More prominent opacity
in the left base is similar with a somewhat streaky appearance. No
other acute abnormalities.
IMPRESSION: 1. Improving pulmonary infiltrates. Streaky opacity remains in the
left base consistent with persistent infiltrate as it was not
present on the May 19, 2019 study. No other acute abnormalities.

## 2020-09-22 ENCOUNTER — Ambulatory Visit (INDEPENDENT_AMBULATORY_CARE_PROVIDER_SITE_OTHER): Payer: PPO | Admitting: Otolaryngology

## 2020-09-22 NOTE — Progress Notes (Signed)
Office Visit Note  Patient: Diana Roach             Date of Birth: Aug 03, 1951           MRN: [id number]             PCP: Leeanne Rio, MD Referring: Leeanne Rio, MD Visit Date: 10/02/2020 Occupation: @GUAROCC @  Subjective:  Left knee pain.   History of Present Illness: Diana Roach is a 69 y.o. female with history of osteoarthritis, degenerative disc disease and osteopenia.  She states she had surgery on her left hand by Dr. Amedeo Plenty and that improved her her grip.  She continues to have some discomfort in her bilateral hands due to underlying osteoarthritis.  She continues to have some discomfort in trochanteric bursa area off and on but is not very painful.  Recently her left knee has been hurting her.  Activities of Daily Living:  Patient reports morning stiffness for 10-15 minutes.   Patient Reports nocturnal pain.  Difficulty dressing/grooming: Denies Difficulty climbing stairs: Reports Difficulty getting out of chair: Denies Difficulty using hands for taps, buttons, cutlery, and/or writing: Reports  Review of Systems  Constitutional: Negative for fatigue.  HENT: Positive for mouth sores. Negative for mouth dryness and nose dryness.   Eyes: Positive for pain, itching and dryness. Negative for visual disturbance.  Respiratory: Negative for cough, hemoptysis and difficulty breathing.   Cardiovascular: Negative for chest pain, palpitations and swelling in legs/feet.  Gastrointestinal: Positive for constipation. Negative for abdominal pain, blood in stool and diarrhea.  Endocrine: Negative for increased urination.  Genitourinary: Negative for painful urination.  Musculoskeletal: Positive for arthralgias, joint pain, myalgias, muscle weakness, morning stiffness and myalgias. Negative for joint swelling and muscle tenderness.  Skin: Positive for rash and redness. Negative for color change.       Lichen planus  Allergic/Immunologic: Negative for  susceptible to infections.  Neurological: Negative for dizziness, numbness, headaches, memory loss and weakness.  Hematological: Positive for swollen glands.  Psychiatric/Behavioral: Positive for sleep disturbance. Negative for confusion.    PMFS History:  Patient Active Problem List   Diagnosis Date Noted  . Rash 02/27/2020  . Osteopenia 11/23/2019  . Generalized pain 08/21/2019  . GERD (gastroesophageal reflux disease) 06/19/2019  . COVID-19 05/23/2019  . Palpitations 02/21/2019  . Type 2 diabetes mellitus without complication, without long-term current use of insulin ([facility]) 10/24/2017  . Varicose vein of leg 03/09/2016  . UPJ obstruction, congenital 01/28/2016  . Lumbosacral radiculopathy 01/27/2016  . Diverticulosis 10/01/2014  . Radiculopathy, cervical 09/04/2014  . Bilateral finger numbness 09/04/2014  . Abdominal pain 04/24/2014  . Hair loss 07/19/2011  . Asthma, moderate persistent 05/13/2011  . Lichen planus 42/68/3419  . 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 HYPERLIPIDEMIA 12/22/2006    Past Medical History:  Diagnosis Date  . Asthma   . Cataract   . Diabetes mellitus without complication ([facility])   . Diverticulitis   . Environmental allergies   . Lichen planus     Family History  Problem Relation Age of Onset  . Hypothyroidism Sister   . Cervical cancer Sister   . Fibromyalgia Sister   . Heart disease Mother   . Hypertension Brother   . Lichen planus Brother   . Cancer Brother        lung  . Heart attack Brother   . Diabetes Daughter   . Cancer Daughter        skin cancer  . Colon cancer Neg  Hx   . Pancreatic cancer Neg Hx   . Stomach cancer Neg Hx   . Esophageal cancer Neg Hx   . Breast cancer Neg Hx    Past Surgical History:  Procedure Laterality Date  . APPENDECTOMY  1969  . BACK SURGERY  1986   lumbar spine  . CARPAL TUNNEL RELEASE Bilateral   . CATARACT EXTRACTION, BILATERAL  2020  . COLONOSCOPY  2015  . HAND SURGERY Left 2021   Social  History   Social History Narrative   Lives with daughter, son-in-law, grandson   House in one level house; has stairs to get into house with handrails, grab bars not present, smoke alarms present; throw rugs with backing   Has dog named Willow, chilhuahua/fox terrier   Likes to eat variety, meats, vegetables, fruits   Caffeine- sodas (Pepsi), water, tea, occasional orange juice   Wears seatbelt, wears sunblock      Patient likes to swim, go to beach, camping, shopping   Immunization History  Administered Date(s) Administered  . Fluad Quad(high Dose 65+) 08/12/2020  . Influenza Split 11/03/2012  . Influenza,inj,Quad PF,6+ Mos 09/24/2013, 09/04/2014, 08/11/2016, 07/29/2017, 09/12/2018, 07/24/2019  . PFIZER SARS-COV-2 Vaccination 07/10/2020, 07/31/2020  . Pneumococcal Conjugate-13 07/29/2017  . Pneumococcal Polysaccharide-23 09/12/2018  . Td 06/14/2008  . Tdap 10/26/2019     Objective: Vital Signs: BP (!) 150/83 (BP Location: Left Arm, Patient Position: Sitting, Cuff Size: Small)   Pulse 76   Ht 5\' 1"  (1.549 m)   Wt 149 lb 3.2 oz (67.7 kg)   BMI 28.19 kg/m    Physical Exam Vitals and nursing note reviewed.  Constitutional:      Appearance: She is well-developed and well-nourished.  HENT:     Head: Normocephalic and atraumatic.  Eyes:     Extraocular Movements: EOM normal.     Conjunctiva/sclera: Conjunctivae normal.  Cardiovascular:     Rate and Rhythm: Normal rate and regular rhythm.     Pulses: Intact distal pulses.     Heart sounds: Normal heart sounds.  Pulmonary:     Effort: Pulmonary effort is normal.     Breath sounds: Normal breath sounds.  Abdominal:     General: Bowel sounds are normal.     Palpations: Abdomen is soft.  Musculoskeletal:     Cervical back: Normal range of motion.  Lymphadenopathy:     Cervical: No cervical adenopathy.  Skin:    General: Skin is warm and dry.     Capillary Refill: Capillary refill takes less than 2 seconds.     Comments:  Erythematous rash over bilateral lower extremities due to lichen planus.  Neurological:     Mental Status: She is alert and oriented to person, place, and time.  Psychiatric:        Mood and Affect: Mood and affect normal.        Behavior: Behavior normal.      Musculoskeletal Exam: C-spine was in good range of motion.  She has some discomfort range of motion for lumbar spine.  Shoulder joints, elbow joints, wrist joints with good range of motion.  She has bilateral PIP and DIP thickening with no synovitis.  She has her right third and fourth trigger finger.  Hip joints with good range of motion.  Knee joints are in good range of motion.  She has some discomfort range of motion of her right knee joint.  No tenderness over ankle joints or MTPs was noted.  CDAI Exam: CDAI Score: --  Patient Global: --; Provider Global: -- Swollen: --; Tender: -- Joint Exam 10/02/2020   No joint exam has been documented for this visit   There is currently no information documented on the homunculus. Go to the Rheumatology activity and complete the homunculus joint exam.  Investigation: No additional findings.  Imaging: No results found.  Recent Labs: Lab Results  Component Value Date   WBC 13.4 (H) 06/02/2019   HGB 11.9 (L) 06/02/2019   PLT 486 (H) 06/02/2019   NA 139 08/21/2019   K 4.6 08/21/2019   CL 104 08/21/2019   CO2 22 08/21/2019   GLUCOSE 117 (H) 08/21/2019   BUN 11 08/21/2019   CREATININE 0.92 08/21/2019   BILITOT 0.4 08/21/2019   ALKPHOS 98 08/21/2019   AST 22 08/21/2019   ALT 20 08/21/2019   PROT 7.4 08/21/2019   ALBUMIN 4.5 08/21/2019   CALCIUM 9.9 08/21/2019   GFRAA 74 08/21/2019    Speciality Comments: No specialty comments available.  Procedures:  No procedures performed Allergies: Codeine, Contrast media [iodinated diagnostic agents], Levaquin [levofloxacin], and Minocycline   Assessment / Plan:     Visit Diagnoses: Primary osteoarthritis of both hands - X-rays  were consistent with osteoarthritis. Ultrasound of bilateral hands was unremarkable.  All autoimmune work-up was negative.  Protection muscle strengthening was discussed.  A handout on exercises was given.  Dupuytren's contracture of left hand -  S/p surgery by Dr. Amedeo Plenty and doing well.  S/p bilateral carpal tunnel release - Left median nerve was enlarged to 0.15 cm on ultrasound examination.  Right median nerve was within normal limits.  Trochanteric bursitis of both hips-doing better.  She has been doing stretching exercises.  Right knee pain-she has been having discomfort in her right knee.  No warmth swelling effusion was noted.  I offered x-rays which she declined.  I have advised her to contact me in case her symptoms get worse.  Use of Voltaren gel and stretching exercises were discussed.  A handout on exercises was given.  Primary osteoarthritis of both feet - X-ray showed bilateral hallux rigidus.  DDD (degenerative disc disease), lumbar - Is status post surgery in the 80s.   Osteopenia of multiple sites - Injury 25th 2021 DEXA Femur Neck Right is 0.830 g/cm2 with a T-scoreof -1.5.  DEXA findings were reviewed.  Use of calcium, vitamin D and resistive exercises were discussed.  Family history of rheumatoid arthritis - In her mother, and maternal grandmother  History of COVID-19 - July 2020.  Type 2 diabetes mellitus without complication, without long-term current use of insulin ([facility])  Diverticulosis  Lichen planus  History of hyperlipidemia  History of gastroesophageal reflux (GERD)  Orders: No orders of the defined types were placed in this encounter.  No orders of the defined types were placed in this encounter.    Follow-Up Instructions: Return in about 6 months (around 04/02/2021) for Osteoarthritis.   Bo Merino, MD  Note - This record has been created using Editor, commissioning.  Chart creation errors have been sought, but may not always  have been located.  Such creation errors do not reflect on  the standard of medical care.

## 2020-10-02 ENCOUNTER — Encounter: Payer: Self-pay | Admitting: Rheumatology

## 2020-10-02 ENCOUNTER — Ambulatory Visit (INDEPENDENT_AMBULATORY_CARE_PROVIDER_SITE_OTHER): Payer: PPO | Admitting: Rheumatology

## 2020-10-02 ENCOUNTER — Other Ambulatory Visit: Payer: Self-pay

## 2020-10-02 VITALS — BP 150/83 | HR 76 | Ht 61.0 in | Wt 149.2 lb

## 2020-10-02 DIAGNOSIS — M720 Palmar fascial fibromatosis [Dupuytren]: Secondary | ICD-10-CM

## 2020-10-02 DIAGNOSIS — M8589 Other specified disorders of bone density and structure, multiple sites: Secondary | ICD-10-CM | POA: Diagnosis not present

## 2020-10-02 DIAGNOSIS — L439 Lichen planus, unspecified: Secondary | ICD-10-CM | POA: Diagnosis not present

## 2020-10-02 DIAGNOSIS — M19041 Primary osteoarthritis, right hand: Secondary | ICD-10-CM

## 2020-10-02 DIAGNOSIS — M51369 Other intervertebral disc degeneration, lumbar region without mention of lumbar back pain or lower extremity pain: Secondary | ICD-10-CM

## 2020-10-02 DIAGNOSIS — Z9889 Other specified postprocedural states: Secondary | ICD-10-CM | POA: Diagnosis not present

## 2020-10-02 DIAGNOSIS — M7061 Trochanteric bursitis, right hip: Secondary | ICD-10-CM

## 2020-10-02 DIAGNOSIS — M5136 Other intervertebral disc degeneration, lumbar region: Secondary | ICD-10-CM

## 2020-10-02 DIAGNOSIS — M19072 Primary osteoarthritis, left ankle and foot: Secondary | ICD-10-CM

## 2020-10-02 DIAGNOSIS — M25561 Pain in right knee: Secondary | ICD-10-CM

## 2020-10-02 DIAGNOSIS — Z8639 Personal history of other endocrine, nutritional and metabolic disease: Secondary | ICD-10-CM

## 2020-10-02 DIAGNOSIS — Z8261 Family history of arthritis: Secondary | ICD-10-CM

## 2020-10-02 DIAGNOSIS — E119 Type 2 diabetes mellitus without complications: Secondary | ICD-10-CM | POA: Diagnosis not present

## 2020-10-02 DIAGNOSIS — Z8616 Personal history of COVID-19: Secondary | ICD-10-CM | POA: Diagnosis not present

## 2020-10-02 DIAGNOSIS — M19071 Primary osteoarthritis, right ankle and foot: Secondary | ICD-10-CM | POA: Diagnosis not present

## 2020-10-02 DIAGNOSIS — Z8719 Personal history of other diseases of the digestive system: Secondary | ICD-10-CM

## 2020-10-02 DIAGNOSIS — K5790 Diverticulosis of intestine, part unspecified, without perforation or abscess without bleeding: Secondary | ICD-10-CM | POA: Diagnosis not present

## 2020-10-02 DIAGNOSIS — G8929 Other chronic pain: Secondary | ICD-10-CM

## 2020-10-02 DIAGNOSIS — M19042 Primary osteoarthritis, left hand: Secondary | ICD-10-CM

## 2020-10-02 DIAGNOSIS — M7062 Trochanteric bursitis, left hip: Secondary | ICD-10-CM

## 2020-10-02 NOTE — Patient Instructions (Signed)
Hand Exercises Hand exercises can be helpful for almost anyone. These exercises can strengthen the hands, improve flexibility and movement, and increase blood flow to the hands. These results can make work and daily tasks easier. Hand exercises can be especially helpful for people who have joint pain from arthritis or have nerve damage from overuse (carpal tunnel syndrome). These exercises can also help people who have injured a hand. Exercises Most of these hand exercises are gentle stretching and motion exercises. It is usually safe to do them often throughout the day. Warming up your hands before exercise may help to reduce stiffness. You can do this with gentle massage or by placing your hands in warm water for 10-15 minutes. It is normal to feel some stretching, pulling, tightness, or mild discomfort as you begin new exercises. This will gradually improve. Stop an exercise right away if you feel sudden, severe pain or your pain gets worse. Ask your health care provider which exercises are best for you. Knuckle bend or "claw" fist 1. Stand or sit with your arm, hand, and all five fingers pointed straight up. Make sure to keep your wrist straight during the exercise. 2. Gently bend your fingers down toward your palm until the tips of your fingers are touching the top of your palm. Keep your big knuckle straight and just bend the small knuckles in your fingers. 3. Hold this position for __________ seconds. 4. Straighten (extend) your fingers back to the starting position. Repeat this exercise 5-10 times with each hand. Full finger fist 1. Stand or sit with your arm, hand, and all five fingers pointed straight up. Make sure to keep your wrist straight during the exercise. 2. Gently bend your fingers into your palm until the tips of your fingers are touching the middle of your palm. 3. Hold this position for __________ seconds. 4. Extend your fingers back to the starting position, stretching every  joint fully. Repeat this exercise 5-10 times with each hand. Straight fist 1. Stand or sit with your arm, hand, and all five fingers pointed straight up. Make sure to keep your wrist straight during the exercise. 2. Gently bend your fingers at the big knuckle, where your fingers meet your hand, and the middle knuckle. Keep the knuckle at the tips of your fingers straight and try to touch the bottom of your palm. 3. Hold this position for __________ seconds. 4. Extend your fingers back to the starting position, stretching every joint fully. Repeat this exercise 5-10 times with each hand. Tabletop 1. Stand or sit with your arm, hand, and all five fingers pointed straight up. Make sure to keep your wrist straight during the exercise. 2. Gently bend your fingers at the big knuckle, where your fingers meet your hand, as far down as you can while keeping the small knuckles in your fingers straight. Think of forming a tabletop with your fingers. 3. Hold this position for __________ seconds. 4. Extend your fingers back to the starting position, stretching every joint fully. Repeat this exercise 5-10 times with each hand. Finger spread 1. Place your hand flat on a table with your palm facing down. Make sure your wrist stays straight as you do this exercise. 2. Spread your fingers and thumb apart from each other as far as you can until you feel a gentle stretch. Hold this position for __________ seconds. 3. Bring your fingers and thumb tight together again. Hold this position for __________ seconds. Repeat this exercise 5-10 times with each hand.   Making circles 1. Stand or sit with your arm, hand, and all five fingers pointed straight up. Make sure to keep your wrist straight during the exercise. 2. Make a circle by touching the tip of your thumb to the tip of your index finger. 3. Hold for __________ seconds. Then open your hand wide. 4. Repeat this motion with your thumb and each finger on your  hand. Repeat this exercise 5-10 times with each hand. Thumb motion 1. Sit with your forearm resting on a table and your wrist straight. Your thumb should be facing up toward the ceiling. Keep your fingers relaxed as you move your thumb. 2. Lift your thumb up as high as you can toward the ceiling. Hold for __________ seconds. 3. Bend your thumb across your palm as far as you can, reaching the tip of your thumb for the small finger (pinkie) side of your palm. Hold for __________ seconds. Repeat this exercise 5-10 times with each hand. Grip strengthening  1. Hold a stress ball or other soft ball in the middle of your hand. 2. Slowly increase the pressure, squeezing the ball as much as you can without causing pain. Think of bringing the tips of your fingers into the middle of your palm. All of your finger joints should bend when doing this exercise. 3. Hold your squeeze for __________ seconds, then relax. Repeat this exercise 5-10 times with each hand. Contact a health care provider if:  Your hand pain or discomfort gets much worse when you do an exercise.  Your hand pain or discomfort does not improve within 2 hours after you exercise. If you have any of these problems, stop doing these exercises right away. Do not do them again unless your health care provider says that you can. Get help right away if:  You develop sudden, severe hand pain or swelling. If this happens, stop doing these exercises right away. Do not do them again unless your health care provider says that you can. This information is not intended to replace advice given to you by your health care provider. Make sure you discuss any questions you have with your health care provider. Document Revised: 02/01/2019 Document Reviewed: 10/12/2018 Elsevier Patient Education  2020 [employer]  Journal for Nurse Practitioners, 15(4), 263-267. Retrieved July 31, 2018 from http://clinicalkey.com/nursing">  Knee Exercises Ask your  health care provider which exercises are safe for you. Do exercises exactly as told by your health care provider and adjust them as directed. It is normal to feel mild stretching, pulling, tightness, or discomfort as you do these exercises. Stop right away if you feel sudden pain or your pain gets worse. Do not begin these exercises until told by your health care provider. Stretching and range-of-motion exercises These exercises warm up your muscles and joints and improve the movement and flexibility of your knee. These exercises also help to relieve pain and swelling. Knee extension, prone 1. Lie on your abdomen (prone position) on a bed. 2. Place your left / right knee just beyond the edge of the surface so your knee is not on the bed. You can put a towel under your left / right thigh just above your kneecap for comfort. 3. Relax your leg muscles and allow gravity to straighten your knee (extension). You should feel a stretch behind your left / right knee. 4. Hold this position for __________ seconds. 5. Scoot up so your knee is supported between repetitions. Repeat __________ times. Complete this exercise __________ times a day.   Knee flexion, active  1. Lie on your back with both legs straight. If this causes back discomfort, bend your left / right knee so your foot is flat on the floor. 2. Slowly slide your left / right heel back toward your buttocks. Stop when you feel a gentle stretch in the front of your knee or thigh (flexion). 3. Hold this position for __________ seconds. 4. Slowly slide your left / right heel back to the starting position. Repeat __________ times. Complete this exercise __________ times a day. Quadriceps stretch, prone  1. Lie on your abdomen on a firm surface, such as a bed or padded floor. 2. Bend your left / right knee and hold your ankle. If you cannot reach your ankle or pant leg, loop a belt around your foot and grab the belt instead. 3. Gently pull your heel  toward your buttocks. Your knee should not slide out to the side. You should feel a stretch in the front of your thigh and knee (quadriceps). 4. Hold this position for __________ seconds. Repeat __________ times. Complete this exercise __________ times a day. Hamstring, supine 1. Lie on your back (supine position). 2. Loop a belt or towel over the ball of your left / right foot. The ball of your foot is on the walking surface, right under your toes. 3. Straighten your left / right knee and slowly pull on the belt to raise your leg until you feel a gentle stretch behind your knee (hamstring). ? Do not let your knee bend while you do this. ? Keep your other leg flat on the floor. 4. Hold this position for __________ seconds. Repeat __________ times. Complete this exercise __________ times a day. Strengthening exercises These exercises build strength and endurance in your knee. Endurance is the ability to use your muscles for a long time, even after they get tired. Quadriceps, isometric This exercise stretches the muscles in front of your thigh (quadriceps) without moving your knee joint (isometric). 1. Lie on your back with your left / right leg extended and your other knee bent. Put a rolled towel or small pillow under your knee if told by your health care provider. 2. Slowly tense the muscles in the front of your left / right thigh. You should see your kneecap slide up toward your hip or see increased dimpling just above the knee. This motion will push the back of the knee toward the floor. 3. For __________ seconds, hold the muscle as tight as you can without increasing your pain. 4. Relax the muscles slowly and completely. Repeat __________ times. Complete this exercise __________ times a day. Straight leg raises This exercise stretches the muscles in front of your thigh (quadriceps) and the muscles that move your hips (hip flexors). 1. Lie on your back with your left / right leg extended and  your other knee bent. 2. Tense the muscles in the front of your left / right thigh. You should see your kneecap slide up or see increased dimpling just above the knee. Your thigh may even shake a bit. 3. Keep these muscles tight as you raise your leg 4-6 inches (10-15 cm) off the floor. Do not let your knee bend. 4. Hold this position for __________ seconds. 5. Keep these muscles tense as you lower your leg. 6. Relax your muscles slowly and completely after each repetition. Repeat __________ times. Complete this exercise __________ times a day. Hamstring, isometric 1. Lie on your back on a firm surface. 2. Bend your   left / right knee about __________ degrees. 3. Dig your left / right heel into the surface as if you are trying to pull it toward your buttocks. Tighten the muscles in the back of your thighs (hamstring) to "dig" as hard as you can without increasing any pain. 4. Hold this position for __________ seconds. 5. Release the tension gradually and allow your muscles to relax completely for __________ seconds after each repetition. Repeat __________ times. Complete this exercise __________ times a day. Hamstring curls If told by your health care provider, do this exercise while wearing ankle weights. Begin with __________ lb weights. Then increase the weight by 1 lb (0.5 kg) increments. Do not wear ankle weights that are more than __________ lb. 1. Lie on your abdomen with your legs straight. 2. Bend your left / right knee as far as you can without feeling pain. Keep your hips flat against the floor. 3. Hold this position for __________ seconds. 4. Slowly lower your leg to the starting position. Repeat __________ times. Complete this exercise __________ times a day. Squats This exercise strengthens the muscles in front of your thigh and knee (quadriceps). 1. Stand in front of a table, with your feet and knees pointing straight ahead. You may rest your hands on the table for balance but  not for support. 2. Slowly bend your knees and lower your hips like you are going to sit in a chair. ? Keep your weight over your heels, not over your toes. ? Keep your lower legs upright so they are parallel with the table legs. ? Do not let your hips go lower than your knees. ? Do not bend lower than told by your health care provider. ? If your knee pain increases, do not bend as low. 3. Hold the squat position for __________ seconds. 4. Slowly push with your legs to return to standing. Do not use your hands to pull yourself to standing. Repeat __________ times. Complete this exercise __________ times a day. Wall slides This exercise strengthens the muscles in front of your thigh and knee (quadriceps). 1. Lean your back against a smooth wall or door, and walk your feet out 18-24 inches (46-61 cm) from it. 2. Place your feet hip-width apart. 3. Slowly slide down the wall or door until your knees bend __________ degrees. Keep your knees over your heels, not over your toes. Keep your knees in line with your hips. 4. Hold this position for __________ seconds. Repeat __________ times. Complete this exercise __________ times a day. Straight leg raises This exercise strengthens the muscles that rotate the leg at the hip and move it away from your body (hip abductors). 1. Lie on your side with your left / right leg in the top position. Lie so your head, shoulder, knee, and hip line up. You may bend your bottom knee to help you keep your balance. 2. Roll your hips slightly forward so your hips are stacked directly over each other and your left / right knee is facing forward. 3. Leading with your heel, lift your top leg 4-6 inches (10-15 cm). You should feel the muscles in your outer hip lifting. ? Do not let your foot drift forward. ? Do not let your knee roll toward the ceiling. 4. Hold this position for __________ seconds. 5. Slowly return your leg to the starting position. 6. Let your muscles  relax completely after each repetition. Repeat __________ times. Complete this exercise __________ times a day. Straight leg raises This exercise   stretches the muscles that move your hips away from the front of the pelvis (hip extensors). 1. Lie on your abdomen on a firm surface. You can put a pillow under your hips if that is more comfortable. 2. Tense the muscles in your buttocks and lift your left / right leg about 4-6 inches (10-15 cm). Keep your knee straight as you lift your leg. 3. Hold this position for __________ seconds. 4. Slowly lower your leg to the starting position. 5. Let your leg relax completely after each repetition. Repeat __________ times. Complete this exercise __________ times a day. This information is not intended to replace advice given to you by your health care provider. Make sure you discuss any questions you have with your health care provider. Document Revised: 08/01/2018 Document Reviewed: 08/01/2018 Elsevier Patient Education  2020 Else[employer] Inc.  

## 2020-10-25 ENCOUNTER — Encounter: Payer: Self-pay | Admitting: Family Medicine

## 2020-10-27 ENCOUNTER — Other Ambulatory Visit: Payer: Self-pay

## 2020-11-20 ENCOUNTER — Ambulatory Visit (INDEPENDENT_AMBULATORY_CARE_PROVIDER_SITE_OTHER): Payer: PPO | Admitting: Family Medicine

## 2020-11-20 ENCOUNTER — Other Ambulatory Visit: Payer: Self-pay

## 2020-11-20 VITALS — BP 128/76 | HR 73 | Temp 98.1°F | Wt 149.0 lb

## 2020-11-20 DIAGNOSIS — J329 Chronic sinusitis, unspecified: Secondary | ICD-10-CM | POA: Diagnosis not present

## 2020-11-20 DIAGNOSIS — J019 Acute sinusitis, unspecified: Secondary | ICD-10-CM | POA: Insufficient documentation

## 2020-11-20 DIAGNOSIS — J45901 Unspecified asthma with (acute) exacerbation: Secondary | ICD-10-CM | POA: Insufficient documentation

## 2020-11-20 DIAGNOSIS — J0100 Acute maxillary sinusitis, unspecified: Secondary | ICD-10-CM | POA: Diagnosis not present

## 2020-11-20 HISTORY — DX: Acute sinusitis, unspecified: J01.90

## 2020-11-20 MED ORDER — AMOXICILLIN-POT CLAVULANATE 875-125 MG PO TABS: 1.0000 | ORAL_TABLET | Freq: Two times a day (BID) | ORAL | 0 refills | Status: DC

## 2020-11-20 NOTE — Progress Notes (Signed)
    SUBJECTIVE:   CHIEF COMPLAINT / HPI: 'possible sinus infection'  Diana Roach is a 70 year old female presenting for evaluation of congestion/cough.  She reports that over the holidays at the end of December 2021 the majority of her household/family contracted Covid.  The family that she lives with tested positive, however she never got tested because she could not find any place to get tested.  She felt ill with muscle aches, cough, congestion for about 1.5 weeks that slowly was starting to improve however about 1 week ago she had a worsening of symptoms with an increase of nasal congestion, sinus/facial fullness/headache, and intermittent cough.  She also reports intermittent wheezing and feeling a little bit short of breath with activities.  Has been having to use her albuterol more than usual.  Still taking her Flovent.  Denies any difficulty breathing at rest, chest pain.  Denies any associated fever.  Otherwise feels overall okay, a little fatigued.  Eating and drinking as normal.  Vaccinated with [employer], last 1 in 07/2020.  She would like to be tested for Covid now to see if it is negative as to not be a continued exposure risk to the rest of her family that she lives with.  PERTINENT  PMH / PSH: Moderate persistent asthma on daily Flovent, lichen planus, well controlled type 2 diabetes, previous history of COVID-19 in 2020  OBJECTIVE:   BP 128/76   Pulse 73   Temp 98.1 F (36.7 C)   Wt 149 lb (67.6 kg) Comment: Patient reported  SpO2 97%   BMI 28.15 kg/m   General: Alert, NAD HEENT: NCAT, MMM, oropharynx nonerythematous, nasal congestion present, bilateral TM clear with appropriate light reflex, conjunctiva are clear, tender to palpation of frontal and maxillary sinuses bilaterally Cardiac: RRR  Lungs: Clear with inspiration, end expiratory wheeze present throughout with slightly prolonged expiratory phase.  No increased work of breathing at rest or walking within the halls,  satting appropriately on room air. Msk: Moves all extremities spontaneously  Ext: Warm, dry, 2+ distal pulses  ASSESSMENT/PLAN:   Sinusitis, acute Given her recent viral illness with double worsening, will go ahead and treat as bacterial sinusitis. Rx'd Augmentin X 5 days.  Do suspect given her history that she likely did also have COVID-19 at the end of 09/2020 and may continue to experience sequela from this.  Will test for Covid per patient request, however discussed if her test is positive it will be unclear if this is residual from recent illness vs acute and may need to continue to quarantine.  Mild asthma exacerbation In the setting of known moderate persistent asthma, exacerbation likely due to recent viral illness.  Reassuringly breathing comfortable at rest with appropriate oxygen saturations, however has diffuse expiratory wheeze and symptomatic.  Discussed trial of oral steroids (cautiously with well controlled T2DM), however she does not feel her breathing is to this point yet.  Recommended scheduling albuterol every 4-6 hours for the next 1-2 days and then taper, continue Flovent.     Recommended follow-up in the next few days if she feels that her breathing has not improved or sooner if she has acute difficulty breathing.  Patriciaann Clan, [place]

## 2020-11-20 NOTE — Assessment & Plan Note (Signed)
Given her recent viral illness with double worsening, will go ahead and treat as bacterial sinusitis. Rx'd Augmentin X 5 days.  Do suspect given her history that she likely did also have COVID-19 at the end of 09/2020 and may continue to experience sequela from this.  Will test for Covid per patient request, however discussed if her test is positive it will be unclear if this is residual from recent illness vs acute and may need to continue to quarantine.

## 2020-11-20 NOTE — Assessment & Plan Note (Signed)
In the setting of known moderate persistent asthma, exacerbation likely due to recent viral illness.  Reassuringly breathing comfortable at rest with appropriate oxygen saturations, however has diffuse expiratory wheeze and symptomatic.  Discussed trial of oral steroids (cautiously with well controlled T2DM), however she does not feel her breathing is to this point yet.  Recommended scheduling albuterol every 4-6 hours for the next 1-2 days and then taper, continue Flovent.

## 2020-11-20 NOTE — Patient Instructions (Signed)
Start augmentin, can cause some GI upset. Stay hydrated. COVID test should return in about 2 days--like we discussed if it is positive I will not be able to tell if this continued from recent infection in 09/2020 or current.   Schedule albuterol every 4-6 hours for next 1-2 days. If still have trouble with breathing --follow up, may need to try steroids.

## 2020-11-21 ENCOUNTER — Other Ambulatory Visit: Payer: Self-pay | Admitting: Pharmacist

## 2020-11-21 ENCOUNTER — Other Ambulatory Visit: Payer: Self-pay | Admitting: Family Medicine

## 2020-11-21 ENCOUNTER — Telehealth: Payer: Self-pay

## 2020-11-21 DIAGNOSIS — J0100 Acute maxillary sinusitis, unspecified: Secondary | ICD-10-CM

## 2020-11-21 MED ORDER — CEFDINIR 300 MG PO CAPS: 300.0000 mg | ORAL_CAPSULE | Freq: Two times a day (BID) | ORAL | 0 refills | Status: AC

## 2020-11-21 NOTE — Telephone Encounter (Signed)
Patient calls nurse line regarding adverse reaction to medication. First dose of augmentin around 1730 on 1/27. Patient reports that shortly after taking abx, she became "clammy", vomiting (2-3 episodes), "feeling like she was going to pass out". Noticed changing with breathing, however, no throat swelling/closure. States that breathing was slower than normal.   Patient is not currently having any difficulty breathing or any acute symptoms. Patient is requesting a different antibiotic to be sent into the pharmacy.   Patient also wanted to make provider aware that she cannot tolerate Cipro as alternative due to GI upset.   Advised patient to not take anymore of this antibiotic and provided with strict ED precautions.   Please advise.   Talbot Grumbling, RN

## 2020-11-21 NOTE — Progress Notes (Signed)
Sent in cefdinir 300 mg twice daily for the next 5 days.  She previously has tolerated IV ceftriaxone without concern, suspect she would additionally tolerate this well.  Encouraged to take with food as most antibiotics can cause some nausea and GI discomfort.  Please let her know to call us if she has any questions or any problems with this medication.  Patriciaann Clan, DO

## 2020-11-21 NOTE — Telephone Encounter (Signed)
Called patient. No answer. LVM advising that I will send in azithromycin. This not an ideal medication for sinus infections, but our options are limited due to her allergies. Offered for patient to call after-hours line and speak with on call doctor if she has questions.  Will also send mychart message.  Addendum - realized that Dr. Higinio Plan already sent in cefdinir so I called patient again and reached her. Patient denies any immediate hypersensitivity to agumentin, so I think this is reasonable. Will hold off on azithro rx. She said her symptoms occurred about 4 hours after she took the medication yesterday.  Patient did mention during this conversation that she went to a restaurant to eat last night after getting tested for COVID in our clinic yesterday. Advised that she should not eat in restaurants until she has a confirmed negative test.  Leeanne Rio, MD

## 2020-11-22 LAB — SARS-COV-2, NAA 2 DAY TAT

## 2020-11-22 LAB — NOVEL CORONAVIRUS, NAA: SARS-CoV-2, NAA: NOT DETECTED

## 2020-12-25 ENCOUNTER — Other Ambulatory Visit: Payer: Self-pay

## 2020-12-25 ENCOUNTER — Ambulatory Visit (INDEPENDENT_AMBULATORY_CARE_PROVIDER_SITE_OTHER): Payer: PPO | Admitting: Family Medicine

## 2020-12-25 ENCOUNTER — Ambulatory Visit (HOSPITAL_COMMUNITY)
Admission: RE | Admit: 2020-12-25 | Discharge: 2020-12-25 | Disposition: A | Discharge status: Home / Self Care | Payer: PPO | Source: Ambulatory Visit | Attending: Family Medicine | Admitting: Family Medicine

## 2020-12-25 VITALS — BP 138/72 | HR 70 | Wt 145.6 lb

## 2020-12-25 DIAGNOSIS — R1030 Lower abdominal pain, unspecified: Secondary | ICD-10-CM

## 2020-12-25 DIAGNOSIS — M25512 Pain in left shoulder: Secondary | ICD-10-CM

## 2020-12-25 DIAGNOSIS — R0789 Other chest pain: Secondary | ICD-10-CM

## 2020-12-25 DIAGNOSIS — R079 Chest pain, unspecified: Secondary | ICD-10-CM | POA: Insufficient documentation

## 2020-12-25 DIAGNOSIS — E785 Hyperlipidemia, unspecified: Secondary | ICD-10-CM | POA: Diagnosis not present

## 2020-12-25 HISTORY — DX: Other chest pain: R07.89

## 2020-12-25 NOTE — Assessment & Plan Note (Signed)
Patient with left shoulder pain for the last 2 days.  Reports tenderness to palpation and physical exam shows tenderness to palpation, able to recreate the pain.  She reports it improved with Goody powders.  Recommended she trial Tylenol and continue to monitor, if it worsens or does not resolve she will return.

## 2020-12-25 NOTE — Assessment & Plan Note (Signed)
Abdominal pain in the upper quadrants, patient reports history of diverticulitis as well as issues with constipation.  Reports that since starting MiraLAX her pain has improved and she has had 1 small bowel movement that was mucousy.  Symptoms could be consistent with mild diverticulitis or constipation.  Recommended patient increase MiraLAX dose and strict return precautions given.

## 2020-12-25 NOTE — Assessment & Plan Note (Signed)
Patient reports 2 episodes of chest discomfort over the last 2 days.  It is not associated with any increased shortness of breath although she has been short of breath over the last week or so which she attributes to her asthma and allergies.  EKG completed today showing sinus bradycardia with no concerns for STEMI.  Discussed if she is on any medication such as aspirin or statins given she has diabetes and high cholesterol.  Patient reports that she has been prescribed statins in the past but will not take it because her sister had an anaphylactic reaction to statins.  She would like to discuss cholesterol medications with her PCP.  Also recommended she initiate daily 81 mg aspirin.  Patient is agreeable to this.  Strict ED and return precautions given

## 2020-12-25 NOTE — Progress Notes (Signed)
SUBJECTIVE:   CHIEF COMPLAINT / HPI:   Stomach pain Patient reports history of diverticulitis as well as constipation.  She reports that over the last week she has had issues with upper abdominal pain into her diaphragm.  3 days ago she started taking MiraLAX and feels that it has slightly improved but she did have a bowel movement that was mucousy.  Denies any fever.  Does acknowledge that yesterday she had short period of chest pain in the middle of her chest and then she had this again this morning.  This chest pain sometimes occurs when she is having issues with constipation.  It is not reproducible with palpation.  Chest pain and shortness of breath Patient reports the chest pain as mentioned above.  She reports that this happens intermittently.  She also reports worsening shortness of breath over the last week.  She says that she has asthma and this tends to happen when the temperatures warm up and her asthma flares.  She is currently on Flovent, albuterol as needed.  Not currently on any controller medications.  Feels that the shortness of breath is most likely related to her asthma and allergies.  Left shoulder and hand pain Patient reports that she has had pain in her left shoulder that radiates down her left arm which started 2 days ago but has progressively gotten worse.  She woke up at 3 AM this morning with the pain and took Goody's powder which helped the pain but it did not completely resolve.  Reports having issues with the shoulder pain in the past but denies any recent injuries or strains to the shoulder.  Pain is reproducible with palpation.   PERTINENT  PMH / PSH: Diverticulosis, asthma, type 2 diabetes, cervical radiculopathy  OBJECTIVE:   BP 138/72   Pulse 70   Wt 145 lb 9.6 oz (66 kg)   SpO2 97%   BMI 27.51 kg/m   General: Well-appearing 70 year old female, intermittently rubbing left shoulder Cardiac: Regular rate and rhythm, no murmurs appreciated, EKG  performed during this visit showed sinus bradycardia with no other abnormalities Respiratory: Normal work of breathing, lungs clear to auscultation bilaterally Abdomen: Soft, no tenderness to palpation, positive bowel sounds Extremities: No lower extremity edema noted   ASSESSMENT/PLAN:   Abdominal pain Abdominal pain in the upper quadrants, patient reports history of diverticulitis as well as issues with constipation.  Reports that since starting MiraLAX her pain has improved and she has had 1 small bowel movement that was mucousy.  Symptoms could be consistent with mild diverticulitis or constipation.  Recommended patient increase MiraLAX dose and strict return precautions given.  Left shoulder pain Patient with left shoulder pain for the last 2 days.  Reports tenderness to palpation and physical exam shows tenderness to palpation, able to recreate the pain.  She reports it improved with Goody powders.  Recommended she trial Tylenol and continue to monitor, if it worsens or does not resolve she will return.  Chest discomfort Patient reports 2 episodes of chest discomfort over the last 2 days.  It is not associated with any increased shortness of breath although she has been short of breath over the last week or so which she attributes to her asthma and allergies.  EKG completed today showing sinus bradycardia with no concerns for STEMI.  Discussed if she is on any medication such as aspirin or statins given she has diabetes and high cholesterol.  Patient reports that she has been prescribed statins in  the past but will not take it because her sister had an anaphylactic reaction to statins.  She would like to discuss cholesterol medications with her PCP.  Also recommended she initiate daily 81 mg aspirin.  Patient is agreeable to this.  Strict ED and return precautions given    Gifford Shave, MD [place]

## 2020-12-25 NOTE — Patient Instructions (Addendum)
It was a pleasure seeing you today.  Regarding your stomach pain this is most likely related to either constipation or mild diverticulitis.  It is improving with the laxatives so I encourage you to continue this.  He can take it twice daily until you have results.  Regarding your shoulder pain I feel this is muscular in origin because I can reproduce the pain when I press on certain regions of your shoulder.  You can take Tylenol for this pain.  Please try to avoid the Goody powders.  We discussed your chest pain and you said that this typically occurs with constipation.  We performed an EKG which was reassuring that you are not having any cardiac events.  I do however recommend you start some medication to help lower your cholesterol and decrease the risk of heart issues.  I also recommend you start taking an 81 mg aspirin daily.  I would like for you to schedule an appointment with your primary care provider to further discuss the options if a statin medication is not an option for you.  If you have any worsening chest pain, shortness of breath, nausea, vomiting please seek medical attention immediately.  I hope you have a wonderful afternoon!

## 2021-01-22 ENCOUNTER — Other Ambulatory Visit: Payer: Self-pay | Admitting: Family Medicine

## 2021-02-02 ENCOUNTER — Encounter: Payer: Self-pay | Admitting: Family Medicine

## 2021-02-02 ENCOUNTER — Telehealth (INDEPENDENT_AMBULATORY_CARE_PROVIDER_SITE_OTHER): Payer: PPO | Admitting: Family Medicine

## 2021-02-02 DIAGNOSIS — R1030 Lower abdominal pain, unspecified: Secondary | ICD-10-CM | POA: Diagnosis not present

## 2021-02-02 NOTE — Assessment & Plan Note (Signed)
Unclear etiology given right lower abdominal pain that radiates to back and concern for episode vaginal bleeding.  Considered nephrolithiasis, pyelonephrosis, uti, diverticular flare, constipation. Low suspicion for perforation or volvulus but again unable to exam.    -Advised patient that she would benefit from being evaluated in person given her symptoms. -Continue Miralax as previously prescribed,Tylenol for pain prn -Scheduled appointment for tomorrow am with Dr Chauncey Reading -Consider CBC,CMET and urine at that time. -If symptoms worsen prior to evaluation tomorrow patient aware to go to ED  -Strict return precautions provided

## 2021-02-02 NOTE — Progress Notes (Signed)
[facility] Visit  Patient consented to have virtual visit and was identified by name and date of birth. Method of visit: Telephone  Encounter participants: Patient: Diana Roach - located at home Provider: Carollee Leitz - located at home office Others (if applicable): daughter  Chief Complaint: flare up of diverticulosis  HPI:  Patient reports 4-5 weeks ago had flare and was seen in clinic and was recommended to increase Miralax dosing.  She has been using this daily and reports symptoms were improving, then got worse last week but began to improve again.  Yesterday symptoms starting to get bad and are now worse today.  She reports right lower abdominal pain that runs to her back and upper back.  Also had some burning when urinating a couple of days ago. She noticed some blood after wiping herself but no blood noted in stool.  Denies any fevers, vomiting, diarrhea or bloody stool.  Appetite ok but endorses intermittent nausea. Denies any weight loss.  Pertinent PMHx:  Diverticulosis Appendectomy UPJ obstruction,conjenital  Right Hydronephrosis  Exam:  There were no vitals taken for this visit.  Respiratory: no SOB or IWOB, noted able to speak in complete sentences   Assessment/Plan:  Abdominal pain Unclear etiology given right lower abdominal pain that radiates to back and concern for episode vaginal bleeding.  Considered nephrolithiasis, pyelonephrosis, uti, diverticular flare, constipation. Low suspicion for perforation or volvulus but again unable to exam.    -Advised patient that she would benefit from being evaluated in person given her symptoms. -Continue Miralax as previously prescribed,Tylenol for pain prn -Scheduled appointment for tomorrow am with Dr Chauncey Reading -Consider CBC,CMET and urine at that time. -If symptoms worsen prior to evaluation tomorrow patient aware to go to ED  -Strict return precautions provided    Time spent  during visit with patient: 15 minutes

## 2021-02-03 ENCOUNTER — Other Ambulatory Visit: Payer: Self-pay

## 2021-02-03 ENCOUNTER — Ambulatory Visit (INDEPENDENT_AMBULATORY_CARE_PROVIDER_SITE_OTHER): Payer: PPO | Admitting: Family Medicine

## 2021-02-03 ENCOUNTER — Encounter: Payer: Self-pay | Admitting: Family Medicine

## 2021-02-03 VITALS — BP 138/74 | HR 65 | Ht 61.0 in | Wt 146.8 lb

## 2021-02-03 DIAGNOSIS — N939 Abnormal uterine and vaginal bleeding, unspecified: Secondary | ICD-10-CM

## 2021-02-03 DIAGNOSIS — R1030 Lower abdominal pain, unspecified: Secondary | ICD-10-CM

## 2021-02-03 DIAGNOSIS — R300 Dysuria: Secondary | ICD-10-CM | POA: Diagnosis not present

## 2021-02-03 LAB — POCT UA - MICROSCOPIC ONLY

## 2021-02-03 LAB — POCT URINALYSIS DIP (CLINITEK)
Bilirubin, UA: NEGATIVE
Blood, UA: NEGATIVE
Glucose, UA: NEGATIVE mg/dL
Ketones, POC UA: NEGATIVE mg/dL
Nitrite, UA: NEGATIVE
POC PROTEIN,UA: NEGATIVE
Spec Grav, UA: 1.015 (ref 1.010–1.025)
Urobilinogen, UA: 0.2 E.U./dL
pH, UA: 6 (ref 5.0–8.0)

## 2021-02-03 NOTE — Progress Notes (Signed)
    SUBJECTIVE:   CHIEF COMPLAINT / HPI:   Ab pain: Patient comes in with complaint of 1 week of abdominal pain, which has started improving. Her last BM 3 days ago; she normally uses miralax, but has not used in 3 days. She declines fever, vomiting or diarrhea. She endorses constipation and nausea. Reports having a chill last week, no fever. She had some dysuria over the last several days and one episode of blood on TP (after urinating, no BM- patient is sure blood is from vagina). The pain is aching and generalized in her lower abdomen, Left and center >R. She has had diverticulitis several times before and reports that her pain that she has been having feels like prior episodes of diverticulitis. S/p appendectomy. Patient has no h/o uterine problems, last pap smear in 2015 at age 70, no h/o abnormal pap smears.  PERTINENT  PMH / PSH:   OBJECTIVE:   BP 138/74   Pulse 65   Ht 5\' 1"  (1.549 m)   Wt 146 lb 12.8 oz (66.6 kg)   SpO2 94%   BMI 27.74 kg/m   Nursing note and vitals reviewed GEN: age appropriate, WW, resting comfortably in chair, NAD, WNWD HEENT: NCAT. Sclera without injection or icterus. MMM.  Abdomen: Normoactive bowel sounds. Sof, NT,ND. mild tenderness to palpation in supra pubic and LLQ. No rebound or guarding. No HSM. Neuro: Alert and at baseline Ext: no edema Psych: Pleasant and appropriate  ASSESSMENT/PLAN:   Abdominal pain Patient presents with ~ 1 week of abdominal pain that has improved. She has h/o constipation and diverticulitis no BM in last 3 days. Ddx broad, but most likely constipation vs resolving diverticulitis. Recommend patient uses miralax to titrate to 1 soft BM per day. Will check CBC and if WBC elevated, indicating more likely diverticulitis, will consider abx tx. UA w/ only trace leukocytes and trace hematuria, not seen on microscopy- less likely to be UTI. Will get urine culture given dysuria, suprapubic pain. Gave patient return precautions incase  pain worsens, or symptoms return.      Gladys Damme, MD [place]

## 2021-02-03 NOTE — Patient Instructions (Addendum)
It was a pleasure to see you today!  1. I recommend that you take the miralax today- use a second scoop if no response after 1 scoop.   2. If you have repeat vaginal bleeding or your pain gets worse, please let us know. Make a f/u appt for next week if so.  3. We will get some labs today.  If they are abnormal or we need to do something about them, I will call you.  If they are normal, I will send you a message on MyChart (if it is active) or a letter in the mail.  If you don't hear from [place] in 2 weeks, please call the office  [phone].   Be Well,  Dr. Chauncey Reading

## 2021-02-04 LAB — CBC WITH DIFFERENTIAL/PLATELET
Basophils Absolute: 0.1 10*3/uL (ref 0.0–0.2)
Basos: 1 %
EOS (ABSOLUTE): 0.6 10*3/uL — ABNORMAL HIGH (ref 0.0–0.4)
Eos: 6 %
Hematocrit: 41.9 % (ref 34.0–46.6)
Hemoglobin: 13.6 g/dL (ref 11.1–15.9)
Immature Grans (Abs): 0 10*3/uL (ref 0.0–0.1)
Immature Granulocytes: 0 %
Lymphocytes Absolute: 2.6 10*3/uL (ref 0.7–3.1)
Lymphs: 26 %
MCH: 26.2 pg — ABNORMAL LOW (ref 26.6–33.0)
MCHC: 32.5 g/dL (ref 31.5–35.7)
MCV: 81 fL (ref 79–97)
Monocytes Absolute: 0.5 10*3/uL (ref 0.1–0.9)
Monocytes: 5 %
Neutrophils Absolute: 6.2 10*3/uL (ref 1.4–7.0)
Neutrophils: 62 %
Platelets: 412 10*3/uL (ref 150–450)
RBC: 5.2 x10E6/uL (ref 3.77–5.28)
RDW: 15.1 % (ref 11.7–15.4)
WBC: 10.1 10*3/uL (ref 3.4–10.8)

## 2021-02-05 LAB — URINE CULTURE

## 2021-02-09 NOTE — Assessment & Plan Note (Addendum)
Patient presents with ~ 1 week of abdominal pain that has improved. She has h/o constipation and diverticulitis no BM in last 3 days. Ddx broad, but most likely constipation vs resolving diverticulitis. Recommend patient uses miralax to titrate to 1 soft BM per day. Will check CBC and if WBC elevated, indicating more likely diverticulitis, will consider abx tx. UA w/ only trace leukocytes and trace hematuria, not seen on microscopy- less likely to be UTI. Will get urine culture given dysuria, suprapubic pain. Gave patient return precautions incase pain worsens, or symptoms return.

## 2021-03-06 ENCOUNTER — Telehealth: Payer: Self-pay | Admitting: Family Medicine

## 2021-03-06 NOTE — Telephone Encounter (Signed)
Previously attempted to call pt regarding f/u and labs, no response. Labs were WNL and reassuring at that time. She has a h/o diverticulosis and had one episode of what she believes was blood from her vulva. By the time I saw her her abdominal pain had resolved. Called again today to follow up on results and make sure patient returns for f/u re: possible vaginal bleeding. She reports that she is having abdominal pain that is similar to diverticulitis. She has appt with GI on Monday morning, 5/16. I recommend she return for f/u in our clinic for pelvic exam and evaluation for post-menopausal vaginal bleeding. Patient agreed, appt scheduled for 10:50 5/19 in ATC. Will forward message to Dr. Lemmie Evens. Ouida Sills who will be ATC that day.  Gladys Damme, MD [facility], PGY-2

## 2021-03-09 DIAGNOSIS — R140 Abdominal distension (gaseous): Secondary | ICD-10-CM | POA: Diagnosis not present

## 2021-03-09 DIAGNOSIS — R1084 Generalized abdominal pain: Secondary | ICD-10-CM | POA: Diagnosis not present

## 2021-03-09 DIAGNOSIS — K5900 Constipation, unspecified: Secondary | ICD-10-CM | POA: Diagnosis not present

## 2021-03-12 ENCOUNTER — Ambulatory Visit (INDEPENDENT_AMBULATORY_CARE_PROVIDER_SITE_OTHER): Payer: PPO | Admitting: Family Medicine

## 2021-03-12 ENCOUNTER — Other Ambulatory Visit: Payer: Self-pay

## 2021-03-12 VITALS — BP 132/75 | HR 66 | Ht 61.0 in | Wt 146.0 lb

## 2021-03-12 DIAGNOSIS — R1013 Epigastric pain: Secondary | ICD-10-CM | POA: Diagnosis not present

## 2021-03-12 MED ORDER — SENNA 8.6 MG PO TABS: 1.0000 | ORAL_TABLET | Freq: Every day | ORAL | 1 refills | Status: DC

## 2021-03-12 MED ORDER — OMEPRAZOLE 20 MG PO CPDR: 20.0000 mg | DELAYED_RELEASE_CAPSULE | Freq: Every day | ORAL | 3 refills | Status: DC

## 2021-03-12 MED ORDER — POLYETHYLENE GLYCOL 3350 17 G PO PACK: 17.0000 g | PACK | Freq: Every day | ORAL | 0 refills | Status: AC

## 2021-03-12 NOTE — Patient Instructions (Signed)
Thank you for coming in to see [place] today! Please see below to review our plan for today's visit:  1. Take Omeprazole/Prilosec 20mg  once daily for sensation of acid reflux/heart burn.  2. Take Miralax (the "musher") 8 capfuls in 32 oz of water/drink once daily for the next 2 days. This is to clean our your bowels. After that take 1 capful daily.  3. Take Senna 1 tablet daily to help move your bowels to clean them out (this is the "pusher"). Take this once daily for the next 2 days. Then you can take once daily as needed after that.   Please call the clinic at [phone] if your symptoms worsen or you have any concerns. It was our pleasure to serve you!   Dr. Milus Banister [facility]

## 2021-03-12 NOTE — Progress Notes (Signed)
    SUBJECTIVE:   CHIEF COMPLAINT / HPI:   Spot of blood: had 1 time before previous appt, today she reports this has since resolved.   Bloating today: patient says she is bloated and feels bloated. She had a small BM this morning. She has been straining with stooling. She took Miralax yesterday morning. She did not take any this morning ad only takes Miralax when she is having difficulty stooling. She reports having issues with this sensation since Feb 2022, saying she feels like she has an air bubble in the epigastric region. Pooping makes her feel better. She describes her stools as a Bristol 1 and 2. She denies any vomiting, nausea, blood in stools. Patient's last colonoscopy was Jan 2015, she   PERTINENT  PMH / PSH: diverticulosis, GERD  OBJECTIVE:   BP 132/75   Pulse 66   Ht 5\' 1"  (1.549 m)   Wt 146 lb (66.2 kg)   SpO2 97%   BMI 27.59 kg/m    Physical exam: General: Well-appearing, very pleasant patient Respiratory: Comfortable work of breathing on room air Abdomen: Normal bowel sounds appreciated throughout, nontender to palpation, mild stool burden appreciated in left lower quadrant, otherwise no masses  ASSESSMENT/PLAN:   Abdominal discomfort, epigastric Very pleasant patient with history of chronic abdominal pain/discomfort.  Returns today with concerns for bloating.  Is having some constipation (stools described as Bristol 1, 2).  Patient takes MiraLAX as needed for constipation.  She feels better when she has a bowel movement.  Normal bowel sounds appreciated on physical exam with moderate stool burden palpated.  No concern at this time for appendicitis (appendix has been removed), cholecystitis, pancreatitis, diverticulitis, obstruction, or surgical abdomen.  She does have a history of GERD, however no bloody bowel movements to raise concern for ulcer, bleeding AVM.  Patient is overall quite well-appearing. -Performed H. pylori test at today's visit, was negative -Take  Omeprazole/Prilosec 20mg  once daily for sensation of acid reflux/heart burn.  -Take Miralax 8 capfuls in 32 oz of water/drink once daily for the next 2 days. This is to clean out bowels. After that take 1 capful daily.  -Take Senna 1 tablet daily to help move bowels to clean them out. Take this once daily for the next 2 days. Can take once daily as needed after that.      [place]

## 2021-03-14 LAB — H. PYLORI BREATH TEST: H pylori Breath Test: NEGATIVE

## 2021-03-17 ENCOUNTER — Encounter: Payer: Self-pay | Admitting: Family Medicine

## 2021-03-17 DIAGNOSIS — R1013 Epigastric pain: Secondary | ICD-10-CM

## 2021-03-17 HISTORY — DX: Epigastric pain: R10.13

## 2021-03-17 NOTE — Assessment & Plan Note (Addendum)
Very pleasant patient with history of chronic abdominal pain/discomfort.  Returns today with concerns for bloating.  Is having some constipation (stools described as Bristol 1, 2).  Patient takes MiraLAX as needed for constipation.  She feels better when she has a bowel movement.  Normal bowel sounds appreciated on physical exam with moderate stool burden palpated.  No concern at this time for appendicitis (appendix has been removed), cholecystitis, pancreatitis, diverticulitis, obstruction, or surgical abdomen.  She does have a history of GERD, however no bloody bowel movements to raise concern for ulcer, bleeding AVM.  Patient is overall quite well-appearing. -Performed H. pylori test at today's visit, was negative -Take Omeprazole/Prilosec 20mg  once daily for sensation of acid reflux/heart burn.  -Take Miralax 8 capfuls in 32 oz of water/drink once daily for the next 2 days. This is to clean out bowels. After that take 1 capful daily.  -Take Senna 1 tablet daily to help move bowels to clean them out. Take this once daily for the next 2 days. Can take once daily as needed after that.

## 2021-03-19 NOTE — Progress Notes (Deleted)
Office Visit Note  Patient: Diana Roach             Date of Birth: 1951-08-10           MRN: [id number]             PCP: Leeanne Rio, MD Referring: Leeanne Rio, MD Visit Date: 04/02/2021 Occupation: @GUAROCC @  Subjective:  No chief complaint on file.   History of Present Illness: Diana Roach is a 70 y.o. female ***   Activities of Daily Living:  Patient reports morning stiffness for *** {minute/hour:19697}.   Patient {ACTIONS;DENIES/REPORTS:21021675::"Denies"} nocturnal pain.  Difficulty dressing/grooming: {ACTIONS;DENIES/REPORTS:21021675::"Denies"} Difficulty climbing stairs: {ACTIONS;DENIES/REPORTS:21021675::"Denies"} Difficulty getting out of chair: {ACTIONS;DENIES/REPORTS:21021675::"Denies"} Difficulty using hands for taps, buttons, cutlery, and/or writing: {ACTIONS;DENIES/REPORTS:21021675::"Denies"}  No Rheumatology ROS completed.   PMFS History:  Patient Active Problem List   Diagnosis Date Noted  . Abdominal discomfort, epigastric 03/17/2021  . Chest discomfort 12/25/2020  . Sinusitis, acute 11/20/2020  . Mild asthma exacerbation 11/20/2020  . Rash 02/27/2020  . Osteopenia 11/23/2019  . Generalized pain 08/21/2019  . GERD (gastroesophageal reflux disease) 06/19/2019  . COVID-19 05/23/2019  . Palpitations 02/21/2019  . Type 2 diabetes mellitus without complication, without long-term current use of insulin ([place]) 10/24/2017  . Varicose vein of leg 03/09/2016  . UPJ obstruction, congenital 01/28/2016  . Lumbosacral radiculopathy 01/27/2016  . Diverticulosis 10/01/2014  . Radiculopathy, cervical 09/04/2014  . Bilateral finger numbness 09/04/2014  . Abdominal pain 04/24/2014  . Left shoulder pain 07/05/2013  . Hair loss 07/19/2011  . Asthma, moderate persistent 05/13/2011  . Lichen planus 84/16/6063  . 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 Hyperlipidemia 12/22/2006    Past Medical History:  Diagnosis Date  . Asthma   .  Cataract   . Diabetes mellitus without complication ([place])   . Diverticulitis   . Environmental allergies   . Lichen planus     Family History  Problem Relation Age of Onset  . Hypothyroidism Sister   . Cervical cancer Sister   . Fibromyalgia Sister   . Heart disease Mother   . Hypertension Brother   . Lichen planus Brother   . Cancer Brother        lung  . Heart attack Brother   . Diabetes Daughter   . Cancer Daughter        skin cancer  . Colon cancer Neg Hx   . Pancreatic cancer Neg Hx   . Stomach cancer Neg Hx   . Esophageal cancer Neg Hx   . Breast cancer Neg Hx    Past Surgical History:  Procedure Laterality Date  . APPENDECTOMY  1969  . BACK SURGERY  1986   lumbar spine  . CARPAL TUNNEL RELEASE Bilateral   . CATARACT EXTRACTION, BILATERAL  2020  . COLONOSCOPY  2015  . HAND SURGERY Left 2021   Social History   Social History Narrative   Lives with daughter, son-in-law, grandson   House in one level house; has stairs to get into house with handrails, grab bars not present, smoke alarms present; throw rugs with backing   Has dog named Willow, chilhuahua/fox terrier   Likes to eat variety, meats, vegetables, fruits   Caffeine- sodas (Pepsi), water, tea, occasional orange juice   Wears seatbelt, wears sunblock      Patient likes to swim, go to beach, camping, shopping   Immunization History  Administered Date(s) Administered  . Fluad Quad(high Dose 65+) 08/12/2020  . Influenza Split 11/03/2012  .  Influenza,inj,Quad PF,6+ Mos 09/24/2013, 09/04/2014, 08/11/2016, 07/29/2017, 09/12/2018, 07/24/2019  . PFIZER(Purple Top)SARS-COV-2 Vaccination 07/10/2020, 07/31/2020  . Pneumococcal Conjugate-13 07/29/2017  . Pneumococcal Polysaccharide-23 09/12/2018  . Td 06/14/2008  . Tdap 10/26/2019     Objective: Vital Signs: There were no vitals taken for this visit.   Physical Exam   Musculoskeletal Exam: ***  CDAI Exam: CDAI Score: -- Patient Global: --; Provider  Global: -- Swollen: --; Tender: -- Joint Exam 04/02/2021   No joint exam has been documented for this visit   There is currently no information documented on the homunculus. Go to the Rheumatology activity and complete the homunculus joint exam.  Investigation: No additional findings.  Imaging: No results found.  Recent Labs: Lab Results  Component Value Date   WBC 10.1 02/03/2021   HGB 13.6 02/03/2021   PLT 412 02/03/2021   NA 139 08/21/2019   K 4.6 08/21/2019   CL 104 08/21/2019   CO2 22 08/21/2019   GLUCOSE 117 (H) 08/21/2019   BUN 11 08/21/2019   CREATININE 0.92 08/21/2019   BILITOT 0.4 08/21/2019   ALKPHOS 98 08/21/2019   AST 22 08/21/2019   ALT 20 08/21/2019   PROT 7.4 08/21/2019   ALBUMIN 4.5 08/21/2019   CALCIUM 9.9 08/21/2019   GFRAA 74 08/21/2019    Speciality Comments: No specialty comments available.  Procedures:  No procedures performed Allergies: Codeine, Contrast media [iodinated diagnostic agents], Levaquin [levofloxacin], Minocycline, and Augmentin [amoxicillin-pot clavulanate]   Assessment / Plan:     Visit Diagnoses: Primary osteoarthritis of both hands  Dupuytren's contracture of left hand  S/p bilateral carpal tunnel release  Trochanteric bursitis of both hips  Primary osteoarthritis of both feet  DDD (degenerative disc disease), lumbar  Osteopenia of multiple sites  Family history of rheumatoid arthritis  History of COVID-19  Type 2 diabetes mellitus without complication, without long-term current use of insulin (H[facility])  Diverticulosis  Lichen planus  History of hyperlipidemia  History of gastroesophageal reflux (GERD)  Moderate persistent asthma without complication  Orders: No orders of the defined types were placed in this encounter.  No orders of the defined types were placed in this encounter.   Face-to-face time spent with patient was *** minutes. Greater than 50% of time was spent in counseling and coordination  of care.  Follow-Up Instructions: No follow-ups on file.   Ofilia Neas, PA-C  Note - This record has been created using Dragon software.  Chart creation errors have been sought, but may not always  have been located. Such creation errors do not reflect on  the standard of medical care.

## 2021-03-24 DIAGNOSIS — N393 Stress incontinence (female) (male): Secondary | ICD-10-CM | POA: Diagnosis not present

## 2021-03-24 DIAGNOSIS — M545 Low back pain, unspecified: Secondary | ICD-10-CM | POA: Diagnosis not present

## 2021-03-24 DIAGNOSIS — N1330 Unspecified hydronephrosis: Secondary | ICD-10-CM | POA: Diagnosis not present

## 2021-03-31 DIAGNOSIS — S56922A Laceration of unspecified muscles, fascia and tendons at forearm level, left arm, initial encounter: Secondary | ICD-10-CM | POA: Diagnosis not present

## 2021-04-02 ENCOUNTER — Ambulatory Visit: Payer: PPO | Admitting: Rheumatology

## 2021-04-02 DIAGNOSIS — M720 Palmar fascial fibromatosis [Dupuytren]: Secondary | ICD-10-CM

## 2021-04-02 DIAGNOSIS — Z9889 Other specified postprocedural states: Secondary | ICD-10-CM

## 2021-04-02 DIAGNOSIS — Z8261 Family history of arthritis: Secondary | ICD-10-CM

## 2021-04-02 DIAGNOSIS — Z8639 Personal history of other endocrine, nutritional and metabolic disease: Secondary | ICD-10-CM

## 2021-04-02 DIAGNOSIS — J4540 Moderate persistent asthma, uncomplicated: Secondary | ICD-10-CM

## 2021-04-02 DIAGNOSIS — M8589 Other specified disorders of bone density and structure, multiple sites: Secondary | ICD-10-CM

## 2021-04-02 DIAGNOSIS — K5790 Diverticulosis of intestine, part unspecified, without perforation or abscess without bleeding: Secondary | ICD-10-CM

## 2021-04-02 DIAGNOSIS — L439 Lichen planus, unspecified: Secondary | ICD-10-CM

## 2021-04-02 DIAGNOSIS — M19071 Primary osteoarthritis, right ankle and foot: Secondary | ICD-10-CM

## 2021-04-02 DIAGNOSIS — Z8719 Personal history of other diseases of the digestive system: Secondary | ICD-10-CM

## 2021-04-02 DIAGNOSIS — M5136 Other intervertebral disc degeneration, lumbar region: Secondary | ICD-10-CM

## 2021-04-02 DIAGNOSIS — M19041 Primary osteoarthritis, right hand: Secondary | ICD-10-CM

## 2021-04-02 DIAGNOSIS — M7062 Trochanteric bursitis, left hip: Secondary | ICD-10-CM

## 2021-04-02 DIAGNOSIS — Z8616 Personal history of COVID-19: Secondary | ICD-10-CM

## 2021-04-02 DIAGNOSIS — E119 Type 2 diabetes mellitus without complications: Secondary | ICD-10-CM

## 2021-04-06 ENCOUNTER — Ambulatory Visit (INDEPENDENT_AMBULATORY_CARE_PROVIDER_SITE_OTHER): Payer: PPO | Admitting: Family Medicine

## 2021-04-06 ENCOUNTER — Encounter: Payer: Self-pay | Admitting: Family Medicine

## 2021-04-06 ENCOUNTER — Other Ambulatory Visit: Payer: Self-pay

## 2021-04-06 VITALS — BP 128/70 | HR 73 | Ht 61.0 in | Wt 146.2 lb

## 2021-04-06 DIAGNOSIS — K5900 Constipation, unspecified: Secondary | ICD-10-CM | POA: Diagnosis not present

## 2021-04-06 DIAGNOSIS — R140 Abdominal distension (gaseous): Secondary | ICD-10-CM | POA: Diagnosis not present

## 2021-04-06 DIAGNOSIS — M795 Residual foreign body in soft tissue: Secondary | ICD-10-CM

## 2021-04-06 NOTE — Patient Instructions (Signed)
It was wonderful to see you today.  Follow up in 1 week or by Friday if continues to be painful or shows symptoms of infection  Please be sure to schedule follow up at the front  desk before you leave today.   If you haven't already, sign up for My Chart to have easy access to your labs results, and communication with your primary care physician.  Please call the clinic at [phone] if your symptoms worsen or you have any concerns. It was our pleasure to serve you.  Dr. Janus Molder

## 2021-04-07 DIAGNOSIS — M795 Residual foreign body in soft tissue: Secondary | ICD-10-CM | POA: Insufficient documentation

## 2021-04-07 NOTE — Assessment & Plan Note (Addendum)
Possible foreign body in sole of foot. Patient would like to proceed with removal at this time. Consent signed and time out performed. Beyond this visit consider need for plain films for location of foreign body. Discussed follow up if pain returns.   PREOPERATIVE DIAGNOSIS: Foreign body, right foot.  POSTOPERATIVE DIAGNOSIS: Foreign body in the right foot.  PROCEDURE PERFORMED: Excision of foreign body, right foot and surrounding tissue.  ANESTHESIA: 1 cc Lidocaine w/ epi   Site cleaned with alcohol swab and betadine. 1cc lidocaine w/ epi introduced to surrounding site. #15 blade was used to whittle through the epidermal tissue until reaching possible foreign body. A small incision was made and suture scissors used to open area. No foreign body was visualized; site palpated and unable to appreciate feeling of foreign body. There was minimal bleeding and patient tolerated procedure well.

## 2021-04-07 NOTE — Progress Notes (Signed)
    SUBJECTIVE:   CHIEF COMPLAINT / HPI:   Ms. Welliver is a 70 yo F who presents for the below issue:  Something in her foot 3 weeks prior was sanding cabinets in her garage with flip flops on and thought she may have gotten something in her right foot. It is now painful on the sole of her foot right below her pinky toe. When she touches it, it feels as if something is in her foot but she cannot see it. It is most painful when walking. She denies fever and chills.   PERTINENT  PMH / PSH: DM (no medications; last A1c 6.6 07/10/2020)  OBJECTIVE:   BP 128/70   Pulse 73   Ht 5\' 1"  (1.549 m)   Wt 146 lb 3.2 oz (66.3 kg)   SpO2 97%   BMI 27.62 kg/m   General: Appears well, no acute distress. Age appropriate. Extremities: Pinpoint mark on sole of right foot just below 5th & 4th digit without surrounding erythema. 40mm linear hardening in this same area. No clear identifiable object visualized. It is tender to palpation and non compressible.   ASSESSMENT/PLAN:   Foreign body (FB) in soft tissue Possible foreign body in sole of foot. Patient would like to proceed with removal at this time. Consent signed and time out performed. Beyond this visit consider need for plain films for location of foreign body. Discussed follow up if pain returns.   PREOPERATIVE DIAGNOSIS: Foreign body, right foot.  POSTOPERATIVE DIAGNOSIS: Foreign body in the right foot.  PROCEDURE PERFORMED: Excision of foreign body, right foot and surrounding tissue.  ANESTHESIA: 1 cc Lidocaine w/ epi   Site cleaned with alcohol swab and betadine. 1cc lidocaine w/ epi introduced to surrounding site. #15 blade was used to whittle through the epidermal tissue until reaching possible foreign body. A small incision was made and suture scissors used to open area. No foreign body was visualized; site palpated and unable to appreciate feeling of foreign body. There was minimal bleeding and patient tolerated procedure well.    Gerlene Fee, [place]

## 2021-04-10 ENCOUNTER — Ambulatory Visit: Payer: PPO

## 2021-04-10 ENCOUNTER — Ambulatory Visit: Payer: PPO | Admitting: Physician Assistant

## 2021-04-10 ENCOUNTER — Other Ambulatory Visit: Payer: Self-pay

## 2021-04-10 ENCOUNTER — Ambulatory Visit (INDEPENDENT_AMBULATORY_CARE_PROVIDER_SITE_OTHER): Payer: PPO | Admitting: Family Medicine

## 2021-04-10 DIAGNOSIS — B070 Plantar wart: Secondary | ICD-10-CM

## 2021-04-10 HISTORY — DX: Plantar wart: B07.0

## 2021-04-10 MED ORDER — SALICYLIC ACID 26 % EX SOLN: CUTANEOUS | 1 refills | Status: DC

## 2021-04-10 NOTE — Patient Instructions (Addendum)
Salicylic acid -- Salicylic acid is a type of acid that is applied directly to the wart. It comes in different forms, such as a liquid or patch.  If you decide to try salicylic acid treatment at home, you should follow the instructions on the package or your provider's instructions. This often involves first soaking the wart in warm water for five minutes, then drying the skin completely, and then applying the salicylic acid. These steps are repeated each day. If the wart does not go away within 12 weeks, you should stop treatment and see your health care provider.  If you have neuropathy (nerve damage that causes numbness), you should not use salicylic acid, as it could injure your skin without you realizing it.  DIRECTIONS  Soak wart in warm water for 5 minutes. Dry area thoroughly. Apply to entire wart surface, allow to dry, and then apply a second time. Avoid contact with surrounding skin. Continue therapy once or twice daily. Resolution may be expected after 4 to 6 weeks; some warts may take longer to remove.

## 2021-04-10 NOTE — Progress Notes (Signed)
    SUBJECTIVE:   CHIEF COMPLAINT / HPI:   Follow-up foot pain Patient presenting for follow-up of possible foreign body in her right foot.  On 04/06/2021, she was seen and a simple surgical procedure showed no foreign body.  Patient reports that she has continued to have the same pain with no improvement.  Her daughter tried to open the area with a needle and thought they saw something underneath.  Patient reports that she feels a bump under her foot in the area that is painful.  No fevers, chills.  No draining from the site or erythema.  PERTINENT  PMH / PSH: Asthma, type 2 diabetes without neuropathy, lumbosacral radiculopathy, hyperlipidemia  OBJECTIVE:   BP 132/76   Pulse 62   Wt 145 lb (65.8 kg)   SpO2 96%   BMI 27.40 kg/m   General: Well-appearing female, no acute distress Right foot: Affected area palpated at the distal aspect of right fourth metatarsal.  There is a small circular hole with half centimeter area of thickened skin.  No peeling, erythema, draining from site.  Small nontender palpable mobile nodule just proximal to site.  She has tenderness to palpation around this area with about a 1 cm radius of palpated pain.  Ultrasound evaluation reveals no obvious foreign bodies.  ASSESSMENT/PLAN:   Plantar wart of right foot On physical exam, no obvious foreign bodies on ultrasound.  She has a lesion that does appear to look like a plantar wart.  Though, this could also be due to trauma sustained after daughter used needle to look for foreign body.  She does have a small, 1-2 mm palpable mobile subcutaneous nodule just proximal to the site that has some mild tenderness, but I am not sure that it is related to this as her specific tenderness to palpation is distal.  I do not see any signs of infection that would warrant further surgical evaluation.  Though, provided return precautions for this.  For now, we will treat his plantar wart with salicylic acid.  Patient reports no  history of neuropathy but does have some tingling in the tips of her toes, but otherwise has full sensation in her feet.  Given her history of diabetes, I I have recommended she follow-up for this.  However, no concern for using salicylic acid at the site specifically.     Wilber Oliphant, MD [place]

## 2021-04-10 NOTE — Assessment & Plan Note (Signed)
On physical exam, no obvious foreign bodies on ultrasound.  She has a lesion that does appear to look like a plantar wart.  Though, this could also be due to trauma sustained after daughter used needle to look for foreign body.  She does have a small, 1-2 mm palpable mobile subcutaneous nodule just proximal to the site that has some mild tenderness, but I am not sure that it is related to this as her specific tenderness to palpation is distal.  I do not see any signs of infection that would warrant further surgical evaluation.  Though, provided return precautions for this.  For now, we will treat his plantar wart with salicylic acid.  Patient reports no history of neuropathy but does have some tingling in the tips of her toes, but otherwise has full sensation in her feet.  Given her history of diabetes, I I have recommended she follow-up for this.  However, no concern for using salicylic acid at the site specifically.

## 2021-04-15 ENCOUNTER — Ambulatory Visit: Payer: PPO | Admitting: Physician Assistant

## 2021-04-22 ENCOUNTER — Ambulatory Visit (INDEPENDENT_AMBULATORY_CARE_PROVIDER_SITE_OTHER): Payer: PPO | Admitting: Physician Assistant

## 2021-04-22 ENCOUNTER — Other Ambulatory Visit: Payer: Self-pay

## 2021-04-22 ENCOUNTER — Encounter: Payer: Self-pay | Admitting: Physician Assistant

## 2021-04-22 DIAGNOSIS — L439 Lichen planus, unspecified: Secondary | ICD-10-CM | POA: Diagnosis not present

## 2021-04-22 MED ORDER — CLOBETASOL PROPIONATE 0.05 % EX OINT: 1.0000 "application " | TOPICAL_OINTMENT | Freq: Two times a day (BID) | CUTANEOUS | 6 refills | Status: DC

## 2021-04-22 MED ORDER — TRIAMCINOLONE ACETONIDE 10 MG/ML IJ SUSP
10.0000 mg | Freq: Once | INTRAMUSCULAR | Status: AC
  Administered 2021-04-22: 10 mg

## 2021-04-22 NOTE — Progress Notes (Signed)
   Follow-Up Visit   Subjective  Diana Roach is a 70 y.o. female who presents for the following: Annual Exam (Here for annual skin exam. Concerns the lichen planus is coming back on both lower legs. Treatment clobetasol ointment. Needs refill. ). It is really itching bad. Injections made them go away for 2 years. She is self conscious about the way it looks.   The following portions of the chart were reviewed this encounter and updated as appropriate:  Tobacco  Allergies  Meds  Problems  Med Hx  Surg Hx  Fam Hx       Objective  Well appearing patient in no apparent distress; mood and affect are within normal limits.  A full examination was performed including scalp, head, eyes, ears, nose, lips, neck, chest, axillae, abdomen, back, buttocks, bilateral upper extremities, bilateral lower extremities, hands, feet, fingers, toes, fingernails, and toenails. All findings within normal limits unless otherwise noted below.  Left Lower Leg - Anterior (5), Right Lower Leg - Anterior (7) Small, dense, pink excoriated papules and plaques.  Assessment & Plan  Lichen planus Left Lower Leg - Anterior (5); Right Lower Leg - Anterior (7)  clobetasol ointment (TEMOVATE) 0.05 % - Left Lower Leg - Anterior, Right Lower Leg - Anterior Apply 1 application topically 2 (two) times daily.  triamcinolone acetonide (KENALOG) 10 MG/ML injection 10 mg - Left Lower Leg - Anterior, Right Lower Leg - Anterior   Intralesional injection - Left Lower Leg - Anterior, Right Lower Leg - Anterior Location: Both Lower Legs  Informed Consent: Discussed risks (infection, pain, bleeding, bruising, thinning of the skin, loss of skin pigment,  Indentation, lack of resolution, and recurrence of lesion) and benefits of the procedure, as well as the alternatives. Informed consent was obtained. Preparation: The area was prepared in a standard fashion.   Procedure Details: An intralesional injection was performed  with Kenalog 10 mg/cc. 1cc in total was injected.  Total number of injections: 12  Plan: The patient was instructed on post-op care. Recommend OTC analgesia as needed for pain.    I, Treyce Spillers, PA-C, have reviewed all documentation's for this visit.  The documentation on 04/22/21 for the exam, diagnosis, procedures and orders are all accurate and complete.

## 2021-04-30 ENCOUNTER — Other Ambulatory Visit: Payer: Self-pay | Admitting: Family Medicine

## 2021-04-30 DIAGNOSIS — Z1231 Encounter for screening mammogram for malignant neoplasm of breast: Secondary | ICD-10-CM

## 2021-05-06 NOTE — Progress Notes (Deleted)
Office Visit Note  Patient: Diana Roach             Date of Birth: 1951/05/22           MRN: [id number]             PCP: Leeanne Rio, MD Referring: Leeanne Rio, MD Visit Date: 05/20/2021 Occupation: @GUAROCC @  Subjective:  No chief complaint on file.   History of Present Illness: Tamaira Crissy Mccreadie is a 70 y.o. female ***   Activities of Daily Living:  Patient reports morning stiffness for *** {minute/hour:19697}.   Patient {ACTIONS;DENIES/REPORTS:21021675::"Denies"} nocturnal pain.  Difficulty dressing/grooming: {ACTIONS;DENIES/REPORTS:21021675::"Denies"} Difficulty climbing stairs: {ACTIONS;DENIES/REPORTS:21021675::"Denies"} Difficulty getting out of chair: {ACTIONS;DENIES/REPORTS:21021675::"Denies"} Difficulty using hands for taps, buttons, cutlery, and/or writing: {ACTIONS;DENIES/REPORTS:21021675::"Denies"}  No Rheumatology ROS completed.   PMFS History:  Patient Active Problem List   Diagnosis Date Noted   Plantar wart of right foot 04/10/2021   Foreign body (FB) in soft tissue 04/07/2021   Abdominal discomfort, epigastric 03/17/2021   Chest discomfort 12/25/2020   Sinusitis, acute 11/20/2020   Mild asthma exacerbation 11/20/2020   Rash 02/27/2020   Osteopenia 11/23/2019   Generalized pain 08/21/2019   GERD (gastroesophageal reflux disease) 06/19/2019   COVID-19 05/23/2019   Palpitations 02/21/2019   Type 2 diabetes mellitus without complication, without long-term current use of insulin ([place]) 10/24/2017   Varicose vein of leg 03/09/2016   UPJ obstruction, congenital 01/28/2016   Lumbosacral radiculopathy 01/27/2016   Diverticulosis 10/01/2014   Radiculopathy, cervical 09/04/2014   Bilateral finger numbness 09/04/2014   Abdominal pain 04/24/2014   Left shoulder pain 07/05/2013   Hair loss 07/19/2011   Asthma, moderate persistent 97/67/3419   Lichen planus 37/90/2409   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   Hyperlipidemia 12/22/2006     Past Medical History:  Diagnosis Date   Asthma    Cataract    Diabetes mellitus without complication ([place])    Diverticulitis    Environmental allergies    Lichen planus     Family History  Problem Relation Age of Onset   Hypothyroidism Sister    Cervical cancer Sister    Fibromyalgia Sister    Heart disease Mother    Hypertension Brother    Lichen planus Brother    Cancer Brother        lung   Heart attack Brother    Diabetes Daughter    Cancer Daughter        skin cancer   Colon cancer Neg Hx    Pancreatic cancer Neg Hx    Stomach cancer Neg Hx    Esophageal cancer Neg Hx    Breast cancer Neg Hx    Past Surgical History:  Procedure Laterality Date   APPENDECTOMY  1969   BACK SURGERY  1986   lumbar spine   CARPAL TUNNEL RELEASE Bilateral    CATARACT EXTRACTION, BILATERAL  2020   COLONOSCOPY  2015   HAND SURGERY Left 2021   Social History   Social History Narrative   Lives with daughter, son-in-law, grandson   House in one level house; has stairs to get into house with handrails, grab bars not present, smoke alarms present; throw rugs with backing   Has dog named Willow, chilhuahua/fox terrier   Likes to eat variety, meats, vegetables, fruits   Caffeine- sodas (Pepsi), water, tea, occasional orange juice   Wears seatbelt, wears sunblock      Patient likes to swim, go to [employer], camping, shopping   Immunization History  Administered Date(s) Administered   Fluad Quad(high Dose 65+) 08/12/2020   Influenza Split 11/03/2012   Influenza,inj,Quad PF,6+ Mos 09/24/2013, 09/04/2014, 08/11/2016, 07/29/2017, 09/12/2018, 07/24/2019   PFIZER(Purple Top)SARS-COV-2 Vaccination 07/10/2020, 07/31/2020   Pneumococcal Conjugate-13 07/29/2017   Pneumococcal Polysaccharide-23 09/12/2018   Td 06/14/2008   Tdap 10/26/2019     Objective: Vital Signs: There were no vitals taken for this visit.   Physical Exam   Musculoskeletal Exam: ***  CDAI Exam: CDAI Score:  -- Patient Global: --; Provider Global: -- Swollen: --; Tender: -- Joint Exam 05/20/2021   No joint exam has been documented for this visit   There is currently no information documented on the homunculus. Go to the Rheumatology activity and complete the homunculus joint exam.  Investigation: No additional findings.  Imaging: No results found.  Recent Labs: Lab Results  Component Value Date   WBC 10.1 02/03/2021   HGB 13.6 02/03/2021   PLT 412 02/03/2021   NA 139 08/21/2019   K 4.6 08/21/2019   CL 104 08/21/2019   CO2 22 08/21/2019   GLUCOSE 117 (H) 08/21/2019   BUN 11 08/21/2019   CREATININE 0.92 08/21/2019   BILITOT 0.4 08/21/2019   ALKPHOS 98 08/21/2019   AST 22 08/21/2019   ALT 20 08/21/2019   PROT 7.4 08/21/2019   ALBUMIN 4.5 08/21/2019   CALCIUM 9.9 08/21/2019   GFRAA 74 08/21/2019    Speciality Comments: No specialty comments available.  Procedures:  No procedures performed Allergies: Codeine, Contrast media [iodinated diagnostic agents], Levaquin [levofloxacin], Minocycline, and Augmentin [amoxicillin-pot clavulanate]   Assessment / Plan:     Visit Diagnoses: No diagnosis found.  Orders: No orders of the defined types were placed in this encounter.  No orders of the defined types were placed in this encounter.   Face-to-face time spent with patient was *** minutes. Greater than 50% of time was spent in counseling and coordination of care.  Follow-Up Instructions: No follow-ups on file.   Earnestine Mealing, CMA  Note - This record has been created using Editor, commissioning.  Chart creation errors have been sought, but may not always  have been located. Such creation errors do not reflect on  the standard of medical care.

## 2021-05-20 ENCOUNTER — Other Ambulatory Visit: Payer: Self-pay

## 2021-05-20 ENCOUNTER — Encounter: Payer: Self-pay | Admitting: Family Medicine

## 2021-05-20 ENCOUNTER — Ambulatory Visit (HOSPITAL_COMMUNITY)
Admission: RE | Admit: 2021-05-20 | Discharge: 2021-05-20 | Disposition: A | Discharge status: Home / Self Care | Payer: PPO | Source: Ambulatory Visit | Attending: Family Medicine | Admitting: Family Medicine

## 2021-05-20 ENCOUNTER — Ambulatory Visit (INDEPENDENT_AMBULATORY_CARE_PROVIDER_SITE_OTHER): Payer: PPO | Admitting: Family Medicine

## 2021-05-20 ENCOUNTER — Ambulatory Visit: Payer: PPO | Admitting: Rheumatology

## 2021-05-20 VITALS — BP 154/90 | HR 61 | Ht 61.0 in | Wt 145.6 lb

## 2021-05-20 DIAGNOSIS — M19072 Primary osteoarthritis, left ankle and foot: Secondary | ICD-10-CM

## 2021-05-20 DIAGNOSIS — I498 Other specified cardiac arrhythmias: Secondary | ICD-10-CM | POA: Diagnosis not present

## 2021-05-20 DIAGNOSIS — Z8719 Personal history of other diseases of the digestive system: Secondary | ICD-10-CM

## 2021-05-20 DIAGNOSIS — E785 Hyperlipidemia, unspecified: Secondary | ICD-10-CM

## 2021-05-20 DIAGNOSIS — K5790 Diverticulosis of intestine, part unspecified, without perforation or abscess without bleeding: Secondary | ICD-10-CM

## 2021-05-20 DIAGNOSIS — Z8639 Personal history of other endocrine, nutritional and metabolic disease: Secondary | ICD-10-CM

## 2021-05-20 DIAGNOSIS — R002 Palpitations: Secondary | ICD-10-CM

## 2021-05-20 DIAGNOSIS — M720 Palmar fascial fibromatosis [Dupuytren]: Secondary | ICD-10-CM

## 2021-05-20 DIAGNOSIS — L439 Lichen planus, unspecified: Secondary | ICD-10-CM

## 2021-05-20 DIAGNOSIS — E119 Type 2 diabetes mellitus without complications: Secondary | ICD-10-CM

## 2021-05-20 DIAGNOSIS — M8589 Other specified disorders of bone density and structure, multiple sites: Secondary | ICD-10-CM

## 2021-05-20 DIAGNOSIS — Z8261 Family history of arthritis: Secondary | ICD-10-CM

## 2021-05-20 DIAGNOSIS — M19041 Primary osteoarthritis, right hand: Secondary | ICD-10-CM

## 2021-05-20 DIAGNOSIS — M5136 Other intervertebral disc degeneration, lumbar region: Secondary | ICD-10-CM

## 2021-05-20 DIAGNOSIS — Z8616 Personal history of COVID-19: Secondary | ICD-10-CM

## 2021-05-20 DIAGNOSIS — M7061 Trochanteric bursitis, right hip: Secondary | ICD-10-CM

## 2021-05-20 DIAGNOSIS — Z9889 Other specified postprocedural states: Secondary | ICD-10-CM

## 2021-05-20 NOTE — Patient Instructions (Signed)
What I know so far is reassuring.  I don't know enough yet to give you complete reassurance. Please come in soon for fasting blood work - Monday would be fine.  Call before to tell them you are coming in. Someone should call you to set up the Holter monitor - the 24 hour continuous EKG. I would like you to come back in 2-3 weeks to discuss the results in person.  One thing I will want to recheck is your blood pressure.  It was a little high this visit.

## 2021-05-21 ENCOUNTER — Other Ambulatory Visit: Payer: PPO

## 2021-05-21 ENCOUNTER — Ambulatory Visit (INDEPENDENT_AMBULATORY_CARE_PROVIDER_SITE_OTHER): Payer: PPO

## 2021-05-21 ENCOUNTER — Other Ambulatory Visit: Payer: Self-pay | Admitting: Family Medicine

## 2021-05-21 DIAGNOSIS — E785 Hyperlipidemia, unspecified: Secondary | ICD-10-CM | POA: Diagnosis not present

## 2021-05-21 DIAGNOSIS — R002 Palpitations: Secondary | ICD-10-CM

## 2021-05-21 DIAGNOSIS — E119 Type 2 diabetes mellitus without complications: Secondary | ICD-10-CM | POA: Diagnosis not present

## 2021-05-21 NOTE — Progress Notes (Unsigned)
Patient enrolled for [employer] to mail a 3 day ZIO XT monitor to her address on file.

## 2021-05-22 ENCOUNTER — Encounter: Payer: Self-pay | Admitting: Family Medicine

## 2021-05-22 LAB — CMP14+EGFR
ALT: 17 IU/L (ref 0–32)
AST: 17 IU/L (ref 0–40)
Albumin/Globulin Ratio: 1.6 (ref 1.2–2.2)
Albumin: 4.5 g/dL (ref 3.8–4.8)
Alkaline Phosphatase: 108 IU/L (ref 44–121)
BUN/Creatinine Ratio: 14 (ref 12–28)
BUN: 13 mg/dL (ref 8–27)
Bilirubin Total: 0.4 mg/dL (ref 0.0–1.2)
CO2: 23 mmol/L (ref 20–29)
Calcium: 9.8 mg/dL (ref 8.7–10.3)
Chloride: 103 mmol/L (ref 96–106)
Creatinine, Ser: 0.93 mg/dL (ref 0.57–1.00)
Globulin, Total: 2.8 g/dL (ref 1.5–4.5)
Glucose: 120 mg/dL — ABNORMAL HIGH (ref 65–99)
Potassium: 4.7 mmol/L (ref 3.5–5.2)
Sodium: 139 mmol/L (ref 134–144)
Total Protein: 7.3 g/dL (ref 6.0–8.5)
eGFR: 66 mL/min/{1.73_m2} (ref >59)

## 2021-05-22 LAB — LIPID PANEL
Chol/HDL Ratio: 4.4 ratio (ref 0.0–4.4)
Cholesterol, Total: 221 mg/dL — ABNORMAL HIGH (ref 100–199)
HDL: 50 mg/dL (ref >39)
LDL Chol Calc (NIH): 140 mg/dL — ABNORMAL HIGH (ref 0–99)
Triglycerides: 174 mg/dL — ABNORMAL HIGH (ref 0–149)
VLDL Cholesterol Cal: 31 mg/dL (ref 5–40)

## 2021-05-22 LAB — HEMOGLOBIN A1C
Est. average glucose Bld gHb Est-mCnc: 157 mg/dL
Hgb A1c MFr Bld: 7.1 % — ABNORMAL HIGH (ref 4.8–5.6)

## 2021-05-22 LAB — TSH: TSH: 2.92 u[IU]/mL (ref 0.450–4.500)

## 2021-05-22 MED ORDER — ROSUVASTATIN CALCIUM 20 MG PO TABS: 20.0000 mg | ORAL_TABLET | Freq: Every day | ORAL | 6 refills | Status: DC

## 2021-05-22 NOTE — Assessment & Plan Note (Signed)
Increased ldl.  Willing to try crestor

## 2021-05-22 NOTE — Addendum Note (Signed)
Addended by: Zenia Resides on: 05/22/2021 09:52 AM   Modules accepted: Orders

## 2021-05-22 NOTE — Assessment & Plan Note (Signed)
EKG shows RSR.  Since having daily palpitations, I should be able to capture on Holter.  Also check TSH

## 2021-05-22 NOTE — Assessment & Plan Note (Signed)
Check lipids.  LDL came back elevated.  She is willing to take crestor.  Rx sent in.

## 2021-05-22 NOTE — Assessment & Plan Note (Signed)
Near goal with A1C=7.1  Since this was not the focus of the visit, will defer changes to PCP.

## 2021-05-22 NOTE — Progress Notes (Signed)
    SUBJECTIVE:   CHIEF COMPLAINT / HPI:   Palpitations and more. Longstanding rare palpitations.  Recently much worse - multiple times per day.  No syncope or lightheadedness.  Denies DOE.  Modest caffeine intake.  No stimulant meds.  No cardiac hx.  States also has some intermitant CP.  No pattern and specifically denies exertional CP.  High cholesterol in past.  Diabetic and not on statin due to fear - sister had anaphylaxis with statin.  Never seen by cards.    HTA nurse told her she had a heart m.  This is the first she had heard of this.  Does have asthma and has occasional SOB which she attributes to her asthma.     Diabetes.  A1C due.     OBJECTIVE:   BP (!) 154/90   Pulse 61   Ht '5\' 1"'$  (1.549 m)   Wt 145 lb 9.6 oz (66 kg)   SpO2 95%   BMI 27.51 kg/m   Lungs clear Cardiac RRR with 1/6 SEM.  No gallup Abd benign   ASSESSMENT/PLAN:   No problem-specific Assessment & Plan notes found for this encounter.     Zenia Resides, MD [place]

## 2021-05-24 DIAGNOSIS — R002 Palpitations: Secondary | ICD-10-CM

## 2021-06-01 DIAGNOSIS — R002 Palpitations: Secondary | ICD-10-CM | POA: Diagnosis not present

## 2021-06-09 ENCOUNTER — Telehealth: Payer: Self-pay | Admitting: Family Medicine

## 2021-06-09 DIAGNOSIS — N1330 Unspecified hydronephrosis: Secondary | ICD-10-CM | POA: Diagnosis not present

## 2021-06-09 DIAGNOSIS — N393 Stress incontinence (female) (male): Secondary | ICD-10-CM | POA: Diagnosis not present

## 2021-06-09 DIAGNOSIS — R002 Palpitations: Secondary | ICD-10-CM

## 2021-06-09 DIAGNOSIS — M545 Low back pain, unspecified: Secondary | ICD-10-CM | POA: Diagnosis not present

## 2021-06-09 NOTE — Telephone Encounter (Signed)
Called patient and told results.  No serious or worrisome arrythmia.  No need for any anticoagulation.  Results are reassuring and I would not work up further.  She should make a routine FU appointment with her PCP, [name].

## 2021-06-17 DIAGNOSIS — M25512 Pain in left shoulder: Secondary | ICD-10-CM | POA: Diagnosis not present

## 2021-06-17 DIAGNOSIS — R200 Anesthesia of skin: Secondary | ICD-10-CM | POA: Diagnosis not present

## 2021-06-23 ENCOUNTER — Other Ambulatory Visit: Payer: Self-pay

## 2021-06-23 ENCOUNTER — Ambulatory Visit
Admission: RE | Admit: 2021-06-23 | Discharge: 2021-06-23 | Disposition: A | Discharge status: Home / Self Care | Payer: PPO | Source: Ambulatory Visit | Attending: Family Medicine | Admitting: Family Medicine

## 2021-06-23 DIAGNOSIS — Z1231 Encounter for screening mammogram for malignant neoplasm of breast: Secondary | ICD-10-CM | POA: Diagnosis not present

## 2021-07-02 ENCOUNTER — Other Ambulatory Visit: Payer: Self-pay | Admitting: Family Medicine

## 2021-07-03 NOTE — Telephone Encounter (Signed)
Appointment made.  .Cathye Kreiter R Ginamarie Banfield, CMA  

## 2021-07-03 NOTE — Telephone Encounter (Signed)
Please let patient know I am refilling this medication, but she needs to schedule an appointment with me.   Thanks, Magdaline Zollars J Alyric Parkin, MD  

## 2021-07-07 NOTE — Progress Notes (Signed)
Office Visit Note  Patient: Diana Roach             Date of Birth: [birth year]           MRN: [id number]             PCP: Leeanne Rio, MD Referring: Leeanne Rio, MD Visit Date: 07/21/2021 Occupation: '@GUAROCC'$ @  Subjective:  Pain in multiple joints.   History of Present Illness: Diana Roach is a 70 y.o. female with a history of osteoarthritis.  She states recently has been having increased pain and discomfort in her left shoulder.  She has been seeing Dr. Amedeo Plenty and had 2 cortisone injections without much results.  She has not tried physical therapy yet.  She continues to have pain and discomfort in her bilateral hands and trigger finger.  She had trigger finger release in the past.  She also had bilateral carpal tunnel release.  She has been having discomfort in her left trochanteric bursa.  She has nocturnal pain in the trochanteric bursa.  She continues to have some discomfort in her lower back and her feet as well.  She denies any joint swelling.  Activities of Daily Living:  Patient reports morning stiffness for 15-20 minutes.   Patient Denies nocturnal pain.  Difficulty dressing/grooming: Denies Difficulty climbing stairs: Denies Difficulty getting out of chair: Denies Difficulty using hands for taps, buttons, cutlery, and/or writing: Reports  Review of Systems  Constitutional:  Positive for fatigue.  HENT:  Positive for mouth sores. Negative for mouth dryness and nose dryness.   Eyes:  Positive for dryness. Negative for pain and itching.  Respiratory:  Positive for shortness of breath. Negative for difficulty breathing.   Cardiovascular:  Positive for palpitations. Negative for chest pain.  Gastrointestinal:  Positive for constipation. Negative for blood in stool and diarrhea.  Endocrine: Negative for increased urination.  Genitourinary:  Negative for difficulty urinating.  Musculoskeletal:  Positive for joint pain, joint pain, joint  swelling, myalgias, morning stiffness and myalgias. Negative for muscle tenderness.  Skin:  Negative for color change, rash and redness.  Allergic/Immunologic: Negative for susceptible to infections.  Neurological:  Positive for dizziness and numbness. Negative for headaches, memory loss and weakness.  Hematological:  Negative for bruising/bleeding tendency.  Psychiatric/Behavioral:  Negative for confusion.    PMFS History:  Patient Active Problem List   Diagnosis Date Noted   Plantar wart of right foot 04/10/2021   Abdominal discomfort, epigastric 03/17/2021   Chest discomfort 12/25/2020   Sinusitis, acute 11/20/2020   Rash 02/27/2020   Osteopenia 11/23/2019   Generalized pain 08/21/2019   GERD (gastroesophageal reflux disease) 06/19/2019   Palpitations 02/21/2019   Type 2 diabetes mellitus without complication, without long-term current use of insulin ([place]) 10/24/2017   Varicose vein of leg 03/09/2016   UPJ obstruction, congenital 01/28/2016   Lumbosacral radiculopathy 01/27/2016   Diverticulosis 10/01/2014   Radiculopathy, cervical 09/04/2014   Bilateral finger numbness 09/04/2014   Abdominal pain 04/24/2014   Left shoulder pain 07/05/2013   Hair loss 07/19/2011   Asthma, moderate persistent [id number]   Lichen planus [id number]   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   Hypercholesteremia 12/22/2006    Past Medical History:  Diagnosis Date   Asthma    Cataract    Diabetes mellitus without complication ([place])    Diverticulitis    Environmental allergies    Lichen planus     Family History  Problem Relation Age of Onset   Hypothyroidism Sister  Cervical cancer Sister    Fibromyalgia Sister    Heart disease Mother    Hypertension Brother    Lichen planus Brother    Cancer Brother        lung   Heart attack Brother    Diabetes Daughter    Cancer Daughter        skin cancer   Colon cancer Neg Hx    Pancreatic cancer Neg Hx    Stomach cancer Neg Hx    Esophageal  cancer Neg Hx    Breast cancer Neg Hx    Past Surgical History:  Procedure Laterality Date   APPENDECTOMY  1969   BACK SURGERY  1986   lumbar spine   CARPAL TUNNEL RELEASE Bilateral    CATARACT EXTRACTION, BILATERAL  2020   COLONOSCOPY  2015   HAND SURGERY Left 2021   Social History   Social History Narrative   Lives with daughter, son-in-law, grandson   House in one level house; has stairs to get into house with handrails, grab bars not present, smoke alarms present; throw rugs with backing   Has dog named Morning Sun, chilhuahua/fox terrier   Likes to eat variety, meats, vegetables, fruits   Caffeine- sodas (Pepsi), water, tea, occasional orange juice   Wears seatbelt, wears sunblock      Patient likes to swim, go to beach, camping, shopping   Immunization History  Administered Date(s) Administered   Fluad Quad(high Dose 65+) 08/12/2020   Influenza Split 11/03/2012   Influenza,inj,Quad PF,6+ Mos 09/24/2013, 09/04/2014, 08/11/2016, 07/29/2017, 09/12/2018, 07/24/2019   PFIZER(Purple Top)SARS-COV-2 Vaccination 07/10/2020, 07/31/2020   Pneumococcal Conjugate-13 07/29/2017   Pneumococcal Polysaccharide-23 09/12/2018   Td 06/14/2008   Tdap 10/26/2019     Objective: Vital Signs: BP 128/82 (BP Location: Left Arm, Patient Position: Sitting, Cuff Size: Normal)   Pulse 61   Ht '5\' 1"'$  (1.549 m)   Wt 147 lb 9.6 oz (67 kg)   BMI 27.89 kg/m    Physical Exam Vitals and nursing note reviewed.  Constitutional:      Appearance: She is well-developed.  HENT:     Head: Normocephalic and atraumatic.  Eyes:     Conjunctiva/sclera: Conjunctivae normal.  Cardiovascular:     Rate and Rhythm: Normal rate and regular rhythm.     Heart sounds: Normal heart sounds.  Pulmonary:     Effort: Pulmonary effort is normal.     Breath sounds: Normal breath sounds.  Abdominal:     General: Bowel sounds are normal.     Palpations: Abdomen is soft.  Musculoskeletal:     Cervical back: Normal range  of motion.  Lymphadenopathy:     Cervical: No cervical adenopathy.  Skin:    General: Skin is warm and dry.     Capillary Refill: Capillary refill takes less than 2 seconds.  Neurological:     Mental Status: She is alert and oriented to person, place, and time.  Psychiatric:        Behavior: Behavior normal.     Musculoskeletal Exam: C-spine was in good range of motion.  She had painful range of motion of her left shoulder joint.  Elbow joints and wrist joints in good range of motion.  She had bilateral CMC PIP and DIP thickening and right fifth trigger finger.  She also had thickening of several flexor tendons in her hands.  Hip joints and knee joints in good range of motion.  She had no tenderness over ankles or MTPs.  She had  tenderness over left trochanteric bursa.  CDAI Exam: CDAI Score: -- Patient Global: --; Provider Global: -- Swollen: --; Tender: -- Joint Exam 07/21/2021   No joint exam has been documented for this visit   There is currently no information documented on the homunculus. Go to the Rheumatology activity and complete the homunculus joint exam.  Investigation: No additional findings.  Imaging: MM 3D SCREEN BREAST BILATERAL  Result Date: 06/28/2021 CLINICAL DATA:  Screening. EXAM: DIGITAL SCREENING BILATERAL MAMMOGRAM WITH TOMOSYNTHESIS AND CAD TECHNIQUE: Bilateral screening digital craniocaudal and mediolateral oblique mammograms were obtained. Bilateral screening digital breast tomosynthesis was performed. The images were evaluated with computer-aided detection. COMPARISON:  Previous exam(s). ACR Breast Density Category b: There are scattered areas of fibroglandular density. FINDINGS: There are no findings suspicious for malignancy. IMPRESSION: No mammographic evidence of malignancy. A result letter of this screening mammogram will be mailed directly to the patient. RECOMMENDATION: Screening mammogram in one year. (Code:SM-B-01Y) BI-RADS CATEGORY  1: Negative.  Electronically Signed   By: Ammie Ferrier M.D.   On: 06/28/2021 14:08    Recent Labs: Lab Results  Component Value Date   WBC 10.1 02/03/2021   HGB 13.6 02/03/2021   PLT 412 02/03/2021   NA 139 05/21/2021   K 4.7 05/21/2021   CL 103 05/21/2021   CO2 23 05/21/2021   GLUCOSE 120 (H) 05/21/2021   BUN 13 05/21/2021   CREATININE 0.93 05/21/2021   BILITOT 0.4 05/21/2021   ALKPHOS 108 05/21/2021   AST 17 05/21/2021   ALT 17 05/21/2021   PROT 7.3 05/21/2021   ALBUMIN 4.5 05/21/2021   CALCIUM 9.8 05/21/2021   GFRAA 74 08/21/2019    Speciality Comments: No specialty comments available.  Procedures:  No procedures performed Allergies: Codeine, Contrast media [iodinated diagnostic agents], Levaquin [levofloxacin], Minocycline, and Augmentin [amoxicillin-pot clavulanate]   Assessment / Plan:     Visit Diagnoses: Chronic left shoulder pain-she has been experiencing pain and discomfort in the left shoulder joint.  She states she has had cortisone injections x2 by Dr. Amedeo Plenty without much relief.  I will refer her for physical therapy at [facility].  A handout on shoulder joint exercises was given.  Primary osteoarthritis of both hands - X-rays were consistent with osteoarthritis. Ultrasound of bilateral hands was unremarkable.  All autoimmune work-up was negative.  Hand exercises were demonstrated in the office today.  A handout on hand exercises was given.  Dupuytren's contracture of left hand - S/p surgery by Dr. Amedeo Plenty   S/p bilateral carpal tunnel release - Left median nerve was enlarged to 0.15 cm on ultrasound examination.  Right median nerve was within normal limits.  Trochanteric bursitis of both hips-she has been having increased discomfort in her left trochanteric bursa and nocturnal pain.  I gave her an exercise handout for trochanteric bursitis.  She was also given a prescription for physical therapy.  Primary osteoarthritis of both feet - X-ray showed bilateral hallux  rigidus.  Proper fitting shoes were discussed.  DDD (degenerative disc disease), lumbar - status post surgery in the 80s.  She continues to have some lower back pain.  Osteopenia of multiple sites - January 25th 2021 DEXA Femur Neck Right is 0.830 g/cm2 with a T-scoreof -1.5.  Use of calcium rich diet and exercise was emphasized.  Family history of rheumatoid arthritis - In her mother, and maternal grandmother  History of COVID-19 - July 2020.  Diverticulosis  Type 2 diabetes mellitus without complication, without long-term current use of insulin ([place])  History of gastroesophageal reflux (GERD)  Lichen planus  History of hyperlipidemia  Orders: No orders of the defined types were placed in this encounter.  No orders of the defined types were placed in this encounter.   Follow-Up Instructions: Return in about 6 months (around 01/18/2022) for Osteoarthritis.   Bo Merino, MD  Note - This record has been created using Editor, commissioning.  Chart creation errors have been sought, but may not always  have been located. Such creation errors do not reflect on  the standard of medical care.

## 2021-07-08 DIAGNOSIS — M25512 Pain in left shoulder: Secondary | ICD-10-CM | POA: Diagnosis not present

## 2021-07-08 DIAGNOSIS — R200 Anesthesia of skin: Secondary | ICD-10-CM | POA: Diagnosis not present

## 2021-07-08 DIAGNOSIS — Z4789 Encounter for other orthopedic aftercare: Secondary | ICD-10-CM | POA: Diagnosis not present

## 2021-07-08 DIAGNOSIS — R2231 Localized swelling, mass and lump, right upper limb: Secondary | ICD-10-CM | POA: Diagnosis not present

## 2021-07-10 ENCOUNTER — Other Ambulatory Visit (HOSPITAL_COMMUNITY): Payer: Self-pay

## 2021-07-21 ENCOUNTER — Other Ambulatory Visit: Payer: Self-pay

## 2021-07-21 ENCOUNTER — Ambulatory Visit (INDEPENDENT_AMBULATORY_CARE_PROVIDER_SITE_OTHER): Payer: PPO | Admitting: Rheumatology

## 2021-07-21 ENCOUNTER — Encounter: Payer: Self-pay | Admitting: Rheumatology

## 2021-07-21 VITALS — BP 128/82 | HR 61 | Ht 61.0 in | Wt 147.6 lb

## 2021-07-21 DIAGNOSIS — M720 Palmar fascial fibromatosis [Dupuytren]: Secondary | ICD-10-CM | POA: Diagnosis not present

## 2021-07-21 DIAGNOSIS — Z8616 Personal history of COVID-19: Secondary | ICD-10-CM

## 2021-07-21 DIAGNOSIS — M19071 Primary osteoarthritis, right ankle and foot: Secondary | ICD-10-CM

## 2021-07-21 DIAGNOSIS — M25512 Pain in left shoulder: Secondary | ICD-10-CM

## 2021-07-21 DIAGNOSIS — M19072 Primary osteoarthritis, left ankle and foot: Secondary | ICD-10-CM

## 2021-07-21 DIAGNOSIS — M5136 Other intervertebral disc degeneration, lumbar region: Secondary | ICD-10-CM | POA: Diagnosis not present

## 2021-07-21 DIAGNOSIS — K5790 Diverticulosis of intestine, part unspecified, without perforation or abscess without bleeding: Secondary | ICD-10-CM

## 2021-07-21 DIAGNOSIS — Z8639 Personal history of other endocrine, nutritional and metabolic disease: Secondary | ICD-10-CM

## 2021-07-21 DIAGNOSIS — G8929 Other chronic pain: Secondary | ICD-10-CM

## 2021-07-21 DIAGNOSIS — M19041 Primary osteoarthritis, right hand: Secondary | ICD-10-CM | POA: Diagnosis not present

## 2021-07-21 DIAGNOSIS — Z9889 Other specified postprocedural states: Secondary | ICD-10-CM

## 2021-07-21 DIAGNOSIS — M8589 Other specified disorders of bone density and structure, multiple sites: Secondary | ICD-10-CM

## 2021-07-21 DIAGNOSIS — E119 Type 2 diabetes mellitus without complications: Secondary | ICD-10-CM

## 2021-07-21 DIAGNOSIS — M7061 Trochanteric bursitis, right hip: Secondary | ICD-10-CM | POA: Diagnosis not present

## 2021-07-21 DIAGNOSIS — Z8719 Personal history of other diseases of the digestive system: Secondary | ICD-10-CM

## 2021-07-21 DIAGNOSIS — L439 Lichen planus, unspecified: Secondary | ICD-10-CM

## 2021-07-21 DIAGNOSIS — M7062 Trochanteric bursitis, left hip: Secondary | ICD-10-CM

## 2021-07-21 DIAGNOSIS — Z8261 Family history of arthritis: Secondary | ICD-10-CM | POA: Diagnosis not present

## 2021-07-21 DIAGNOSIS — M19042 Primary osteoarthritis, left hand: Secondary | ICD-10-CM

## 2021-07-21 NOTE — Patient Instructions (Signed)
Iliotibial Band Syndrome Rehab Ask your health care provider which exercises are safe for you. Do exercises exactly as told by your health care provider and adjust them as directed. It is normal to feel mild stretching, pulling, tightness, or discomfort as you do these exercises. Stop right away if you feel sudden pain or your pain gets significantly worse. Do not begin these exercises until told by your health care provider. Stretching and range-of-motion exercises These exercises warm up your muscles and joints and improve the movement and flexibility of your hip and pelvis. Quadriceps stretch, prone  Lie on your abdomen (prone position) on a firm surface, such as a bed or padded floor. Bend your left / right knee and reach back to hold your ankle or pant leg. If you cannot reach your ankle or pant leg, loop a belt around your foot and grab the belt instead. Gently pull your heel toward your buttocks. Your knee should not slide out to the side. You should feel a stretch in the front of your thigh and knee (quadriceps). Hold this position for __________ seconds. Repeat __________ times. Complete this exercise __________ times a day. Iliotibial band stretch An iliotibial band is a strong band of muscle tissue that runs from the outer side of your hip to the outer side of your thigh and knee. Lie on your side with your left / right leg in the top position. Bend both of your knees and grab your left / right ankle. Stretch out your bottom arm to help you balance. Slowly bring your top knee back so your thigh goes behind your trunk. Slowly lower your top leg toward the floor until you feel a gentle stretch on the outside of your left / right hip and thigh. If you do not feel a stretch and your knee will not fall farther, place the heel of your other foot on top of your knee and pull your knee down toward the floor with your foot. Hold this position for __________ seconds. Repeat __________ times.  Complete this exercise __________ times a day. Strengthening exercises These exercises build strength and endurance in your hip and pelvis. Endurance is the ability to use your muscles for a long time, even after they get tired. Straight leg raises, side-lying This exercise strengthens the muscles that rotate the leg at the hip and move it away from your body (hip abductors). Lie on your side with your left / right leg in the top position. Lie so your head, shoulder, hip, and knee line up. You may bend your bottom knee to help you balance. Roll your hips slightly forward so your hips are stacked directly over each other and your left / right knee is facing forward. Tense the muscles in your outer thigh and lift your top leg 4-6 inches (10-15 cm). Hold this position for __________ seconds. Slowly lower your leg to return to the starting position. Let your muscles relax completely before doing another repetition. Repeat __________ times. Complete this exercise __________ times a day. Leg raises, prone This exercise strengthens the muscles that move the hips backward (hip extensors). Lie on your abdomen (prone position) on your bed or a firm surface. You can put a pillow under your hips if that is more comfortable for your lower back. Bend your left / right knee so your foot is straight up in the air. Squeeze your buttocks muscles and lift your left / right thigh off the bed. Do not let your back arch. Tense   your thigh muscle as hard as you can without increasing any knee pain. Hold this position for __________ seconds. Slowly lower your leg to return to the starting position and allow it to relax completely. Repeat __________ times. Complete this exercise __________ times a day. Hip hike Stand sideways on a bottom step. Stand on your left / right leg with your other foot unsupported next to the step. You can hold on to a railing or wall for balance if needed. Keep your knees straight and your  torso square. Then lift your left / right hip up toward the ceiling. Slowly let your left / right hip lower toward the floor, past the starting position. Your foot should get closer to the floor. Do not lean or bend your knees. Repeat __________ times. Complete this exercise __________ times a day. This information is not intended to replace advice given to you by your health care provider. Make sure you discuss any questions you have with your health care provider. Document Revised: 12/19/2019 Document Reviewed: 12/19/2019 Elsevier Patient Education  [place]. Shoulder Exercises Ask your health care provider which exercises are safe for you. Do exercises exactly as told by your health care provider and adjust them as directed. It is normal to feel mild stretching, pulling, tightness, or discomfort as you do these exercises. Stop right away if you feel sudden pain or your pain gets worse. Do not begin these exercises until told by your health care provider. Stretching exercises External rotation and abduction This exercise is sometimes called corner stretch. This exercise rotates your arm outward (external rotation) and moves your arm out from your body (abduction). Stand in a doorway with one of your feet slightly in front of the other. This is called a staggered stance. If you cannot reach your forearms to the door frame, stand facing a corner of a room. Choose one of the following positions as told by your health care provider: Place your hands and forearms on the door frame above your head. Place your hands and forearms on the door frame at the height of your head. Place your hands on the door frame at the height of your elbows. Slowly move your weight onto your front foot until you feel a stretch across your chest and in the front of your shoulders. Keep your head and chest upright and keep your abdominal muscles tight. Hold for __________ seconds. To release the stretch, shift your  weight to your back foot. Repeat __________ times. Complete this exercise __________ times a day. Extension, standing Stand and hold a broomstick, a cane, or a similar object behind your back. Your hands should be a little wider than shoulder width apart. Your palms should face away from your back. Keeping your elbows straight and your shoulder muscles relaxed, move the stick away from your body until you feel a stretch in your shoulders (extension). Avoid shrugging your shoulders while you move the stick. Keep your shoulder blades tucked down toward the middle of your back. Hold for __________ seconds. Slowly return to the starting position. Repeat __________ times. Complete this exercise __________ times a day. Range-of-motion exercises Pendulum  Stand near a wall or a surface that you can hold onto for balance. Bend at the waist and let your left / right arm hang straight down. Use your other arm to support you. Keep your back straight and do not lock your knees. Relax your left / right arm and shoulder muscles, and move your hips and your  trunk so your left / right arm swings freely. Your arm should swing because of the motion of your body, not because you are using your arm or shoulder muscles. Keep moving your hips and trunk so your arm swings in the following directions, as told by your health care provider: Side to side. Forward and backward. In clockwise and counterclockwise circles. Continue each motion for __________ seconds, or for as long as told by your health care provider. Slowly return to the starting position. Repeat __________ times. Complete this exercise __________ times a day. Shoulder flexion, standing  Stand and hold a broomstick, a cane, or a similar object. Place your hands a little more than shoulder width apart on the object. Your left / right hand should be palm up, and your other hand should be palm down. Keep your elbow straight and your shoulder muscles  relaxed. Push the stick up with your healthy arm to raise your left / right arm in front of your body, and then over your head until you feel a stretch in your shoulder (flexion). Avoid shrugging your shoulder while you raise your arm. Keep your shoulder blade tucked down toward the middle of your back. Hold for __________ seconds. Slowly return to the starting position. Repeat __________ times. Complete this exercise __________ times a day. Shoulder abduction, standing Stand and hold a broomstick, a cane, or a similar object. Place your hands a little more than shoulder width apart on the object. Your left / right hand should be palm up, and your other hand should be palm down. Keep your elbow straight and your shoulder muscles relaxed. Push the object across your body toward your left / right side. Raise your left / right arm to the side of your body (abduction) until you feel a stretch in your shoulder. Do not raise your arm above shoulder height unless your health care provider tells you to do that. If directed, raise your arm over your head. Avoid shrugging your shoulder while you raise your arm. Keep your shoulder blade tucked down toward the middle of your back. Hold for __________ seconds. Slowly return to the starting position. Repeat __________ times. Complete this exercise __________ times a day. Internal rotation  Place your left / right hand behind your back, palm up. Use your other hand to dangle an exercise band, a towel, or a similar object over your shoulder. Grasp the band with your left / right hand so you are holding on to both ends. Gently pull up on the band until you feel a stretch in the front of your left / right shoulder. The movement of your arm toward the center of your body is called internal rotation. Avoid shrugging your shoulder while you raise your arm. Keep your shoulder blade tucked down toward the middle of your back. Hold for __________ seconds. Release the  stretch by letting go of the band and lowering your hands. Repeat __________ times. Complete this exercise __________ times a day. Strengthening exercises External rotation  Sit in a stable chair without armrests. Secure an exercise band to a stable object at elbow height on your left / right side. Place a soft object, such as a folded towel or a small pillow, between your left / right upper arm and your body to move your elbow about 4 inches (10 cm) away from your side. Hold the end of the exercise band so it is tight and there is no slack. Keeping your elbow pressed against the soft object,  slowly move your forearm out, away from your abdomen (external rotation). Keep your body steady so only your forearm moves. Hold for __________ seconds. Slowly return to the starting position. Repeat __________ times. Complete this exercise __________ times a day. Shoulder abduction  Sit in a stable chair without armrests, or stand up. Hold a __________ weight in your left / right hand, or hold an exercise band with both hands. Start with your arms straight down and your left / right palm facing in, toward your body. Slowly lift your left / right hand out to your side (abduction). Do not lift your hand above shoulder height unless your health care provider tells you that this is safe. Keep your arms straight. Avoid shrugging your shoulder while you do this movement. Keep your shoulder blade tucked down toward the middle of your back. Hold for __________ seconds. Slowly lower your arm, and return to the starting position. Repeat __________ times. Complete this exercise __________ times a day. Shoulder extension Sit in a stable chair without armrests, or stand up. Secure an exercise band to a stable object in front of you so it is at shoulder height. Hold one end of the exercise band in each hand. Your palms should face each other. Straighten your elbows and lift your hands up to shoulder height. Step  back, away from the secured end of the exercise band, until the band is tight and there is no slack. Squeeze your shoulder blades together as you pull your hands down to the sides of your thighs (extension). Stop when your hands are straight down by your sides. Do not let your hands go behind your body. Hold for __________ seconds. Slowly return to the starting position. Repeat __________ times. Complete this exercise __________ times a day. Shoulder row Sit in a stable chair without armrests, or stand up. Secure an exercise band to a stable object in front of you so it is at waist height. Hold one end of the exercise band in each hand. Position your palms so that your thumbs are facing the ceiling (neutral position). Bend each of your elbows to a 90-degree angle (right angle) and keep your upper arms at your sides. Step back until the band is tight and there is no slack. Slowly pull your elbows back behind you. Hold for __________ seconds. Slowly return to the starting position. Repeat __________ times. Complete this exercise __________ times a day. Shoulder press-ups  Sit in a stable chair that has armrests. Sit upright, with your feet flat on the floor. Put your hands on the armrests so your elbows are bent and your fingers are pointing forward. Your hands should be about even with the sides of your body. Push down on the armrests and use your arms to lift yourself off the chair. Straighten your elbows and lift yourself up as much as you comfortably can. Move your shoulder blades down, and avoid letting your shoulders move up toward your ears. Keep your feet on the ground. As you get stronger, your feet should support less of your body weight as you lift yourself up. Hold for __________ seconds. Slowly lower yourself back into the chair. Repeat __________ times. Complete this exercise __________ times a day. Wall push-ups  Stand so you are facing a stable wall. Your feet should be about  one arm-length away from the wall. Lean forward and place your palms on the wall at shoulder height. Keep your feet flat on the floor as you bend your elbows and  lean forward toward the wall. Hold for __________ seconds. Straighten your elbows to push yourself back to the starting position. Repeat __________ times. Complete this exercise __________ times a day. This information is not intended to replace advice given to you by your health care provider. Make sure you discuss any questions you have with your health care provider. Document Revised: 02/02/2019 Document Reviewed: 11/10/2018 Elsevier Patient Education  [place] Exercises Hand exercises can be helpful for almost anyone. These exercises can strengthen the hands, improve flexibility and movement, and increase blood flow to the hands. These results can make work and daily tasks easier. Hand exercises can be especially helpful for people who have joint pain from arthritis or have nerve damage from overuse (carpal tunnel syndrome). These exercises can also help people who have injured a hand. Exercises Most of these hand exercises are gentle stretching and motion exercises. It is usually safe to do them often throughout the day. Warming up your hands before exercise may help to reduce stiffness. You can do this with gentle massage or by placing your hands in warm water for 10-15 minutes. It is normal to feel some stretching, pulling, tightness, or mild discomfort as you begin new exercises. This will gradually improve. Stop an exercise right away if you feel sudden, severe pain or your pain gets worse. Ask your health care provider which exercises are best for you. Knuckle bend or "claw" fist  Stand or sit with your arm, hand, and all five fingers pointed straight up. Make sure to keep your wrist straight during the exercise. Gently bend your fingers down toward your palm until the tips of your fingers are touching the top of  your palm. Keep your big knuckle straight and just bend the small knuckles in your fingers. Hold this position for __________ seconds. Straighten (extend) your fingers back to the starting position. Repeat this exercise 5-10 times with each hand. Full finger fist  Stand or sit with your arm, hand, and all five fingers pointed straight up. Make sure to keep your wrist straight during the exercise. Gently bend your fingers into your palm until the tips of your fingers are touching the middle of your palm. Hold this position for __________ seconds. Extend your fingers back to the starting position, stretching every joint fully. Repeat this exercise 5-10 times with each hand. Straight fist Stand or sit with your arm, hand, and all five fingers pointed straight up. Make sure to keep your wrist straight during the exercise. Gently bend your fingers at the big knuckle, where your fingers meet your hand, and the middle knuckle. Keep the knuckle at the tips of your fingers straight and try to touch the bottom of your palm. Hold this position for __________ seconds. Extend your fingers back to the starting position, stretching every joint fully. Repeat this exercise 5-10 times with each hand. Tabletop  Stand or sit with your arm, hand, and all five fingers pointed straight up. Make sure to keep your wrist straight during the exercise. Gently bend your fingers at the big knuckle, where your fingers meet your hand, as far down as you can while keeping the small knuckles in your fingers straight. Think of forming a tabletop with your fingers. Hold this position for __________ seconds. Extend your fingers back to the starting position, stretching every joint fully. Repeat this exercise 5-10 times with each hand. Finger spread  Place your hand flat on a table with your palm facing down. Make sure  your wrist stays straight as you do this exercise. Spread your fingers and thumb apart from each other as far  as you can until you feel a gentle stretch. Hold this position for __________ seconds. Bring your fingers and thumb tight together again. Hold this position for __________ seconds. Repeat this exercise 5-10 times with each hand. Making circles  Stand or sit with your arm, hand, and all five fingers pointed straight up. Make sure to keep your wrist straight during the exercise. Make a circle by touching the tip of your thumb to the tip of your index finger. Hold for __________ seconds. Then open your hand wide. Repeat this motion with your thumb and each finger on your hand. Repeat this exercise 5-10 times with each hand. Thumb motion  Sit with your forearm resting on a table and your wrist straight. Your thumb should be facing up toward the ceiling. Keep your fingers relaxed as you move your thumb. Lift your thumb up as high as you can toward the ceiling. Hold for __________ seconds. Bend your thumb across your palm as far as you can, reaching the tip of your thumb for the small finger (pinkie) side of your palm. Hold for __________ seconds. Repeat this exercise 5-10 times with each hand. Grip strengthening  Hold a stress ball or other soft ball in the middle of your hand. Slowly increase the pressure, squeezing the ball as much as you can without causing pain. Think of bringing the tips of your fingers into the middle of your palm. All of your finger joints should bend when doing this exercise. Hold your squeeze for __________ seconds, then relax. Repeat this exercise 5-10 times with each hand. Contact a health care provider if: Your hand pain or discomfort gets much worse when you do an exercise. Your hand pain or discomfort does not improve within 2 hours after you exercise. If you have any of these problems, stop doing these exercises right away. Do not do them again unless your health care provider says that you can. Get help right away if: You develop sudden, severe hand pain or  swelling. If this happens, stop doing these exercises right away. Do not do them again unless your health care provider says that you can. This information is not intended to replace advice given to you by your health care provider. Make sure you discuss any questions you have with your health care provider. Document Revised: 01/29/2021 Document Reviewed: 01/29/2021 Elsevier Patient Education  [place].

## 2021-08-04 ENCOUNTER — Encounter: Payer: Self-pay | Admitting: Family Medicine

## 2021-08-04 ENCOUNTER — Ambulatory Visit (INDEPENDENT_AMBULATORY_CARE_PROVIDER_SITE_OTHER): Payer: PPO | Admitting: Family Medicine

## 2021-08-04 ENCOUNTER — Other Ambulatory Visit: Payer: Self-pay

## 2021-08-04 VITALS — BP 143/92 | HR 66 | Wt 147.6 lb

## 2021-08-04 DIAGNOSIS — E119 Type 2 diabetes mellitus without complications: Secondary | ICD-10-CM

## 2021-08-04 DIAGNOSIS — Z23 Encounter for immunization: Secondary | ICD-10-CM | POA: Diagnosis not present

## 2021-08-04 DIAGNOSIS — J4540 Moderate persistent asthma, uncomplicated: Secondary | ICD-10-CM | POA: Diagnosis not present

## 2021-08-04 DIAGNOSIS — R002 Palpitations: Secondary | ICD-10-CM | POA: Diagnosis not present

## 2021-08-04 DIAGNOSIS — M25512 Pain in left shoulder: Secondary | ICD-10-CM | POA: Diagnosis not present

## 2021-08-04 DIAGNOSIS — B070 Plantar wart: Secondary | ICD-10-CM

## 2021-08-04 DIAGNOSIS — E7800 Pure hypercholesterolemia, unspecified: Secondary | ICD-10-CM | POA: Diagnosis not present

## 2021-08-04 DIAGNOSIS — R0789 Other chest pain: Secondary | ICD-10-CM

## 2021-08-04 LAB — POCT GLYCOSYLATED HEMOGLOBIN (HGB A1C): HbA1c, POC (controlled diabetic range): 6.7 % (ref 0.0–7.0)

## 2021-08-04 NOTE — Assessment & Plan Note (Signed)
Offered services at this clinic to freeze plantar wart and discussed with patient podiatry would likely do the same thing. Patient would like to see a podiatrist for treatment/removal.

## 2021-08-04 NOTE — Progress Notes (Signed)
SUBJECTIVE:   CHIEF COMPLAINT / HPI:   Diana Roach presents for follow-up  Diabetes controlled with lifestyle modifications - HbA1C is 6.7 today, down from 7.1 in July 2022. She has no concerns and is able to maintain her lifestyle modifications. Has eye exam scheduled for November 7 with Dr. Katy Fitch.  Hypercholesteremia - Diana Roach was prescribed crestor 20mg   at her last visit in July. She took it for 2 weeks but then stopped it due to shoulder pain which she thought was related. Given the shoulder pain has persisted she is open to starting crestor again.  Asthma - Well controlled on Flovent.  Uses albuterol rescue inhaler minimally, about 2 times a month. No concerns for shortness of breath.  Heart flutter - Mentions two episodes of heart flutter. Previously saw Dr. Andria Frames. 24 hour Holter monitor showed normal results.  Plantar wart - Diana Roach has had a plantar wart for some time. Would like to go see a foot doctor. She plans to do this herself but would not mind a referral.  Shoulder pain - Seeing ortho for pain. Has received 2 corticosteroid injections which have not made a difference. Wants to pursue PT. Will bring it up at her next appointment with them on November 2nd. No specific needs from [place] here today about this, she is just updating [place].  Elevated BP - Isolated elevated BP. Diana Roach feels it is due to shoulder pain. Has not ever had elevated BP.   OBJECTIVE:   BP (!) 143/92   Pulse 66   Wt 147 lb 9.6 oz (67 kg)   SpO2 97%   BMI 27.89 kg/m   GEN: Well appearing, in no acute distress PULM: Clear to auscultation, normal work of breathing CARD: Normal rate and rhythm, no murmurs or rubs GI: No distention EXTREMITIES: No cyanosis or edema FOOT EXAM: No skin changes or ulcers between toes or on base of foot. Patient able to identify sensation throughout foot. + plantar wart on ball of foot.   ASSESSMENT/PLAN:   Type 2 diabetes mellitus without complication, without long-term current  use of insulin (HCC) HbA1C is 6.7 today. Previously 7.1 on 05/21/21.  Continue lifestyle modifications of diet, given excellent success with these efforts. Not initiating any medication at this time. Obtaining diabetic eye exam on November 7th 2022. Patient is aware she needs to sign release for [place] to receive results. Conducted diabetic foot exam today. Results normal.  Hypercholesteremia Had stopped taking crestor 2 weeks after starting medication in July due to shoulder pain. Given shoulder pain has persisted and is clearly not related, is comfortable starting again. Will start crestor 20 mg daily. Patient has medication from initiation in July. Will inform pharmacy if she needs refills.  Plantar wart of right foot Offered services at this clinic to freeze plantar wart and discussed with patient podiatry would likely do the same thing. Patient would like to see a podiatrist for treatment/removal.   Left shoulder pain Following with ortho. Will pursue PT referral through them.  Asthma, moderate persistent Well controlled on Flovent with minimal use (2x a month) of albuterol rescue inhaler. Continue with current medication regimen.  Palpitations Previously evaluated with negative 24 hour Holter monitor. Given lack of symptoms today, low symptom severity and no new events, not pursuing further evaluation today.   Elevated BP Isolated event in office per chart review. Will continue to monitor. If future elevated BP, will send patient home with validated BP cuff for home monitoring.  Health Maintenance Received  flu vaccine today Will return for COVID booster shot. Foot exam done today Urine microalbumin obtained today Declines Shingrix prescription this visit. Ophthalmology exam scheduled for November 7th.   RTC in 6 months for follow up  [place]   Patient seen along with [employer] student Cristobal Goldmann. I personally  evaluated this patient along with the student, and verified all aspects of the history, physical exam, and medical decision making as documented by the student. I agree with the student's documentation and have made all necessary edits.  Chrisandra Netters, MD  [place]

## 2021-08-04 NOTE — Patient Instructions (Addendum)
It was a pleasure to see you today Diana Roach!  Congratulations on your work keeping your HbA1C low. It was 6.7 today.  We recommend you restart your rosuvastatin, as that is a really beneficial medication for your cholesterol levels and overall health.  Please let us know how the new Flovent works for you. If it is still spraying out the top or you are finding it difficult to take deep breadths, we can revisit adjusting the dose or changing the medication.  Checking urine protein levels today Flu shot today Referring to foot doctor for your wart  We will see you back in 6 months, sooner if you have any problems.  Be Well, [name] and Ines Bloomer (MS3)

## 2021-08-04 NOTE — Assessment & Plan Note (Signed)
Well controlled on Flovent with minimal use (2x a month) of albuterol rescue inhaler. Continue with current medication regimen.

## 2021-08-04 NOTE — Progress Notes (Signed)
67 

## 2021-08-04 NOTE — Assessment & Plan Note (Deleted)
Previously evaluated with negative 24 hour Holter monitor. Given lack of symptoms today, low symptom severity and no new events, not pursuing further evaluation today.

## 2021-08-04 NOTE — Assessment & Plan Note (Addendum)
HbA1C is 6.7 today. Previously 7.1 on 05/21/21.  Continue lifestyle modifications of diet, given excellent success with these efforts. Not initiating any medication at this time. Obtaining diabetic eye exam on November 7th 2022. Patient is aware she needs to sign release for [place] to receive results. Conducted diabetic foot exam today. Results normal.

## 2021-08-04 NOTE — Assessment & Plan Note (Addendum)
Had stopped taking crestor 2 weeks after starting medication in July due to shoulder pain. Given shoulder pain has persisted and is clearly not related, is comfortable starting again. Will start crestor 20 mg daily. Patient has medication from initiation in July. Will inform pharmacy if she needs refills.

## 2021-08-04 NOTE — Assessment & Plan Note (Addendum)
Following with ortho. Will pursue PT referral through them.

## 2021-08-05 ENCOUNTER — Other Ambulatory Visit: Payer: Self-pay | Admitting: Family Medicine

## 2021-08-05 LAB — MICROALBUMIN / CREATININE URINE RATIO
Creatinine, Urine: 24.2 mg/dL
Microalb/Creat Ratio: <12 mg/g creat (ref 0–29)
Microalbumin, Urine: <3 ug/mL

## 2021-08-10 NOTE — Assessment & Plan Note (Signed)
Previously evaluated with negative 24 hour Holter monitor. Given lack of symptoms today, low symptom severity and no new events, not pursuing further evaluation today.

## 2021-08-26 DIAGNOSIS — R2231 Localized swelling, mass and lump, right upper limb: Secondary | ICD-10-CM | POA: Diagnosis not present

## 2021-08-26 DIAGNOSIS — M25512 Pain in left shoulder: Secondary | ICD-10-CM | POA: Diagnosis not present

## 2021-08-26 DIAGNOSIS — Z4789 Encounter for other orthopedic aftercare: Secondary | ICD-10-CM | POA: Diagnosis not present

## 2021-08-26 DIAGNOSIS — R200 Anesthesia of skin: Secondary | ICD-10-CM | POA: Diagnosis not present

## 2021-08-26 DIAGNOSIS — M542 Cervicalgia: Secondary | ICD-10-CM | POA: Diagnosis not present

## 2021-08-27 ENCOUNTER — Ambulatory Visit: Payer: PPO | Admitting: Family Medicine

## 2021-08-28 ENCOUNTER — Other Ambulatory Visit: Payer: Self-pay

## 2021-08-28 ENCOUNTER — Ambulatory Visit (INDEPENDENT_AMBULATORY_CARE_PROVIDER_SITE_OTHER): Payer: PPO | Admitting: Podiatry

## 2021-08-28 DIAGNOSIS — Q828 Other specified congenital malformations of skin: Secondary | ICD-10-CM

## 2021-08-28 DIAGNOSIS — L989 Disorder of the skin and subcutaneous tissue, unspecified: Secondary | ICD-10-CM

## 2021-08-28 NOTE — Progress Notes (Signed)
Subjective:  Patient ID: Diana Roach, female    DOB: [birth year],  MRN: [id number]  Chief Complaint  Patient presents with   Callouses    Right foot possible plantar wart     70 y.o. female presents with the above complaint.  Patient presents with right submetatarsal 4 porokeratotic lesion.  Mild pain on palpation.  Patient states been going there for quite some time.  She would like to have it removed.  She states it hurts with ambulation.  She has not tried anything for it.  She is try to make some shoe gear modification none of which has helped.  She denies any other acute complaints.   Review of Systems: Negative except as noted in the HPI. Denies N/V/F/Ch.  Past Medical History:  Diagnosis Date   Asthma    Cataract    Diabetes mellitus without complication ([place])    Diverticulitis    Environmental allergies    Lichen planus     Current Outpatient Medications:    acetaminophen (TYLENOL) 325 MG tablet, Take 650 mg by mouth every 6 (six) hours as needed for mild pain or headache., Disp: , Rfl:    albuterol (VENTOLIN HFA) 108 (90 Base) MCG/ACT inhaler, INHALE 2 PUFFS INTO THE LUNGS EVERY 6 HOURS AS NEEDED FOR WHEEZING FOR SHORTNESS OF BREATH (Patient taking differently: as needed.), Disp: 27 g, Rfl: 3   clobetasol ointment (TEMOVATE) 7.49 %, Apply 1 application topically at bedtime., Disp: 45 g, Rfl: 4   clobetasol ointment (TEMOVATE) 4.49 %, Apply 1 application topically 2 (two) times daily., Disp: 60 g, Rfl: 6   ELDERBERRY PO, Take by mouth as needed.  (Patient not taking: Reported on 08/04/2021), Disp: , Rfl:    EQ LORATADINE 10 MG tablet, Take 1 tablet by mouth once daily, Disp: 30 tablet, Rfl: 3   FLOVENT HFA 110 MCG/ACT inhaler, Inhale 2 puffs by mouth twice daily, Disp: 12 g, Rfl: 11   Multiple Vitamin (MULTIVITAMIN PO), Take by mouth daily., Disp: , Rfl:    omeprazole (PRILOSEC) 20 MG capsule, Take 1 capsule by mouth once daily, Disp: 30 capsule, Rfl: 5    polyethylene glycol (MIRALAX / GLYCOLAX) 17 g packet, Take 17 g by mouth daily., Disp: 14 each, Rfl: 0   rosuvastatin (CRESTOR) 20 MG tablet, Take 1 tablet (20 mg total) by mouth daily. (Patient not taking: Reported on 08/04/2021), Disp: 30 tablet, Rfl: 6   Salicylic Acid 26 % SOLN, Soak wart in warm water for 5 minutes. Dry area thoroughly. Apply to entire wart surface, allow to dry, and then apply a second time. (Patient not taking: Reported on 08/04/2021), Disp: 10 mL, Rfl: 1   senna (SENOKOT) 8.6 MG TABS tablet, Take 1 tablet (8.6 mg total) by mouth daily. (Patient taking differently: Take 1 tablet by mouth as needed.), Disp: 15 tablet, Rfl: 1   VITAMIN D PO, Take by mouth daily., Disp: , Rfl:   Social History   Tobacco Use  Smoking Status Former   Types: Cigarettes   Quit date: 10/25/1994   Years since quitting: 26.8  Smokeless Tobacco Never  Tobacco Comments   no plans to start again    Allergies  Allergen Reactions   Codeine Itching and Hives   Contrast Media [Iodinated Diagnostic Agents] Other (See Comments)    BP dropped   Levaquin [Levofloxacin] Other (See Comments)    Hallucinations    Minocycline Other (See Comments)    Dizziness , "strange feeling"   Augmentin [Amoxicillin-Pot  Clavulanate]     Felt "clammy and like I was going to pass out" with vomiting.    Objective:  There were no vitals filed for this visit. There is no height or weight on file to calculate BMI. Constitutional Well developed. Well nourished.  Vascular Dorsalis pedis pulses palpable bilaterally. Posterior tibial pulses palpable bilaterally. Capillary refill normal to all digits.  No cyanosis or clubbing noted. Pedal hair growth normal.  Neurologic Normal speech. Oriented to person, place, and time. Epicritic sensation to light touch grossly present bilaterally.  Dermatologic Hyperkeratotic lesion with central nucleated core noted.  Mild pain on palpation.  No pinpoint bleeding noted   Orthopedic: Normal joint ROM without pain or crepitus bilaterally. No visible deformities. No bony tenderness.   Radiographs: None Assessment:   1. Porokeratosis   2. Benign skin lesion    Plan:  Patient was evaluated and treated and all questions answered.  Right submetatarsal 4 porokeratosis -I explained the patient the etiology of porokeratosis and various treatment options were discussed.  Using chisel blade handle the lesion was debrided down to healthy striated tissue followed by excision of central nucleated core.  I discussed with the patient that she will benefit from shoe gear modification as well.  I also believe she would benefit from offloading the lesion.  She states understanding. -If there is no improvement or recurrence I will plan on doing excisional removal of the porokeratotic lesion via punch technique.  Patient agrees to plan.  No follow-ups on file.

## 2021-08-31 DIAGNOSIS — E119 Type 2 diabetes mellitus without complications: Secondary | ICD-10-CM | POA: Diagnosis not present

## 2021-08-31 DIAGNOSIS — H26493 Other secondary cataract, bilateral: Secondary | ICD-10-CM | POA: Diagnosis not present

## 2021-08-31 DIAGNOSIS — H16223 Keratoconjunctivitis sicca, not specified as Sjogren's, bilateral: Secondary | ICD-10-CM | POA: Diagnosis not present

## 2021-08-31 LAB — HM DIABETES EYE EXAM

## 2021-09-03 ENCOUNTER — Encounter: Payer: Self-pay | Admitting: Family Medicine

## 2021-09-07 DIAGNOSIS — J111 Influenza due to unidentified influenza virus with other respiratory manifestations: Secondary | ICD-10-CM | POA: Diagnosis not present

## 2021-09-07 DIAGNOSIS — R051 Acute cough: Secondary | ICD-10-CM | POA: Diagnosis not present

## 2021-09-07 DIAGNOSIS — R059 Cough, unspecified: Secondary | ICD-10-CM | POA: Diagnosis not present

## 2021-09-22 DIAGNOSIS — M542 Cervicalgia: Secondary | ICD-10-CM | POA: Diagnosis not present

## 2021-09-22 DIAGNOSIS — M25512 Pain in left shoulder: Secondary | ICD-10-CM | POA: Diagnosis not present

## 2021-10-08 DIAGNOSIS — M47812 Spondylosis without myelopathy or radiculopathy, cervical region: Secondary | ICD-10-CM | POA: Diagnosis not present

## 2021-10-08 DIAGNOSIS — M25512 Pain in left shoulder: Secondary | ICD-10-CM | POA: Diagnosis not present

## 2021-10-21 ENCOUNTER — Ambulatory Visit (INDEPENDENT_AMBULATORY_CARE_PROVIDER_SITE_OTHER): Payer: PPO | Admitting: Podiatry

## 2021-10-21 ENCOUNTER — Other Ambulatory Visit: Payer: Self-pay

## 2021-10-21 DIAGNOSIS — Q828 Other specified congenital malformations of skin: Secondary | ICD-10-CM | POA: Diagnosis not present

## 2021-10-21 DIAGNOSIS — L989 Disorder of the skin and subcutaneous tissue, unspecified: Secondary | ICD-10-CM

## 2021-10-23 NOTE — Progress Notes (Signed)
Subjective:  Patient ID: Diana Roach, female    DOB: 1951/03/22,  MRN: [id number]  Chief Complaint  Patient presents with   Callouses    Right foot     70 y.o. female presents with the above complaint.  Patient presents with right submetatarsal 4 porokeratotic lesion.  Mild pain on palpation.  Patient states that it did give her a good few weeks of relief.  It is not hurting her as bad.  She would just like to focus on debridement she does not want it taken out just yet today.  She denies any other acute complaints.   Review of Systems: Negative except as noted in the HPI. Denies N/V/F/Ch.  Past Medical History:  Diagnosis Date   Asthma    Cataract    Diabetes mellitus without complication ([place])    Diverticulitis    Environmental allergies    Lichen planus     Current Outpatient Medications:    acetaminophen (TYLENOL) 325 MG tablet, Take 650 mg by mouth every 6 (six) hours as needed for mild pain or headache., Disp: , Rfl:    albuterol (VENTOLIN HFA) 108 (90 Base) MCG/ACT inhaler, INHALE 2 PUFFS INTO THE LUNGS EVERY 6 HOURS AS NEEDED FOR WHEEZING FOR SHORTNESS OF BREATH (Patient taking differently: as needed.), Disp: 27 g, Rfl: 3   clobetasol ointment (TEMOVATE) 1.95 %, Apply 1 application topically at bedtime., Disp: 45 g, Rfl: 4   clobetasol ointment (TEMOVATE) 0.93 %, Apply 1 application topically 2 (two) times daily., Disp: 60 g, Rfl: 6   ELDERBERRY PO, Take by mouth as needed.  (Patient not taking: Reported on 08/04/2021), Disp: , Rfl:    EQ LORATADINE 10 MG tablet, Take 1 tablet by mouth once daily, Disp: 30 tablet, Rfl: 3   FLOVENT HFA 110 MCG/ACT inhaler, Inhale 2 puffs by mouth twice daily, Disp: 12 g, Rfl: 11   Multiple Vitamin (MULTIVITAMIN PO), Take by mouth daily., Disp: , Rfl:    omeprazole (PRILOSEC) 20 MG capsule, Take 1 capsule by mouth once daily, Disp: 30 capsule, Rfl: 5   polyethylene glycol (MIRALAX / GLYCOLAX) 17 g packet, Take 17 g by mouth daily.,  Disp: 14 each, Rfl: 0   rosuvastatin (CRESTOR) 20 MG tablet, Take 1 tablet (20 mg total) by mouth daily. (Patient not taking: Reported on 08/04/2021), Disp: 30 tablet, Rfl: 6   Salicylic Acid 26 % SOLN, Soak wart in warm water for 5 minutes. Dry area thoroughly. Apply to entire wart surface, allow to dry, and then apply a second time. (Patient not taking: Reported on 08/04/2021), Disp: 10 mL, Rfl: 1   senna (SENOKOT) 8.6 MG TABS tablet, Take 1 tablet (8.6 mg total) by mouth daily. (Patient taking differently: Take 1 tablet by mouth as needed.), Disp: 15 tablet, Rfl: 1   VITAMIN D PO, Take by mouth daily., Disp: , Rfl:   Social History   Tobacco Use  Smoking Status Former   Types: Cigarettes   Quit date: 10/25/1994   Years since quitting: 27.0  Smokeless Tobacco Never  Tobacco Comments   no plans to start again    Allergies  Allergen Reactions   Codeine Itching and Hives   Contrast Media [Iodinated Contrast Media] Other (See Comments)    BP dropped   Levaquin [Levofloxacin] Other (See Comments)    Hallucinations    Minocycline Other (See Comments)    Dizziness , "strange feeling"   Augmentin [Amoxicillin-Pot Clavulanate]     Felt "clammy and like I  was going to pass out" with vomiting.    Objective:  There were no vitals filed for this visit. There is no height or weight on file to calculate BMI. Constitutional Well developed. Well nourished.  Vascular Dorsalis pedis pulses palpable bilaterally. Posterior tibial pulses palpable bilaterally. Capillary refill normal to all digits.  No cyanosis or clubbing noted. Pedal hair growth normal.  Neurologic Normal speech. Oriented to person, place, and time. Epicritic sensation to light touch grossly present bilaterally.  Dermatologic Hyperkeratotic lesion with central nucleated core noted.  Mild pain on palpation.  No pinpoint bleeding noted  Orthopedic: Normal joint ROM without pain or crepitus bilaterally. No visible  deformities. No bony tenderness.   Radiographs: None Assessment:   No diagnosis found.  Plan:  Patient was evaluated and treated and all questions answered.  Right submetatarsal 4 porokeratosis -I explained the patient the etiology of porokeratosis and various treatment options were discussed.  Using chisel blade handle the lesion was debrided down to healthy striated tissue followed by excision of central nucleated core.  I discussed with the patient that she will benefit from shoe gear modification as well.  I also believe she would benefit from offloading the lesion.  She states understanding. -If there is no improvement or recurrence I will plan on doing excisional removal of the porokeratotic lesion via punch technique.  Patient agrees to plan.  No follow-ups on file.

## 2021-10-30 ENCOUNTER — Other Ambulatory Visit: Payer: Self-pay

## 2021-10-30 ENCOUNTER — Telehealth: Payer: Self-pay

## 2021-10-30 ENCOUNTER — Telehealth (INDEPENDENT_AMBULATORY_CARE_PROVIDER_SITE_OTHER): Payer: Medicare Other | Admitting: Family Medicine

## 2021-10-30 DIAGNOSIS — U071 COVID-19: Secondary | ICD-10-CM

## 2021-10-30 NOTE — Progress Notes (Signed)
[facility] Tele[facility] Visit  Patient consented to have virtual visit and was identified by name and date of birth. Method of visit: Telephone  Encounter participants: Patient: Diana Roach - located at home Provider: Zola Button - located at office Others (if applicable): N/A  Chief Complaint: Cough  HPI:  Tested positive 1/4 for Covid. Reports symptoms of congestion, body aches, headache, cough, sore throat, fever (T-max 100.8), chills. Using OTC Robitussin DM and Tylenol. Wanting antiviral treatment.  Other family members have been ill and also tested positive for COVID.  Was hospitalized in 2020 for 2 weeks for Covid. Denies chest pain, SOB.  She has not been taking the rosuvastatin.   ROS: per HPI  Pertinent PMHx: T2DM, asthma, GERD, HLD  Exam:  There were no vitals taken for this visit.  Respiratory: Speaking in full sentences, respirations do not sound labored  Assessment/Plan:  Covid-19 infection Mild infection.  Symptomatic care, recommended honey and Flonase.  Return precautions given.  I believe she will qualify for antiviral medications as she has diabetes and asthma.  Pharmacy information given to see if she qualifies for Paxlovid.  Advised to get the new COVID booster once feeling better.  Time spent during visit with patient: 8 minutes

## 2021-10-30 NOTE — Telephone Encounter (Signed)
Patient calls nurse line regarding testing positive for COVID on Wednesday, 1/4. Symptom onset between Tuesday night and Wednesday morning. Patient reports congestion, body aches, headache, cough, fever (tmax 100.8) and chills. Patient has been taking OTC robitussin DM and tylenol for symptom management. Provided with supportive measures. Patient is requesting antiviral as she "got really sick" the last time she had COVID.   ED precautions given. Please advise.   Talbot Grumbling, RN

## 2021-10-30 NOTE — Patient Instructions (Addendum)
It was nice seeing you today!  For cough, I recommend honey with or without tea.  For congestion, I recommend Flonase nasal spray.  Call the pharmacy before you go to the pharmacy to see if you qualify for antiviral medications for Covid.  [facility] [address], [place], [place] [address]  Give [place] a call or go to the ED if you develop symptoms of chest pain or shortness of breath.  Stay well, Zola Button, MD [place] [phone]

## 2021-11-17 ENCOUNTER — Ambulatory Visit (INDEPENDENT_AMBULATORY_CARE_PROVIDER_SITE_OTHER): Payer: Medicare Other | Admitting: Student

## 2021-11-17 ENCOUNTER — Other Ambulatory Visit: Payer: Self-pay

## 2021-11-17 VITALS — BP 155/88 | HR 68 | Temp 98.8°F | Ht 61.0 in | Wt 145.2 lb

## 2021-11-17 DIAGNOSIS — R002 Palpitations: Secondary | ICD-10-CM

## 2021-11-17 DIAGNOSIS — E119 Type 2 diabetes mellitus without complications: Secondary | ICD-10-CM

## 2021-11-17 DIAGNOSIS — U071 COVID-19: Secondary | ICD-10-CM | POA: Diagnosis not present

## 2021-11-17 LAB — POCT GLYCOSYLATED HEMOGLOBIN (HGB A1C): HbA1c, POC (controlled diabetic range): 7 % (ref 0.0–7.0)

## 2021-11-17 NOTE — Progress Notes (Signed)
° ° °  SUBJECTIVE:   CHIEF COMPLAINT / HPI:   Generalized Malaise   Palpitations  Has been feeling bad with generalized malaise/fatigue for two weeks after being diagnosed with COVID on 10/28/21.  She says that she has also had intermittent palpitations over the same time course. She has a history of palpitations and had a Holter monitor in August of last year which showed isolated PACs and PVCs and a single run of 4 beats SVT. She denies any fevers. She has been checking her BPs at home which have been running 120s/80s. She has a cough at baseline 2/2 asthma and does not believe her symptoms have been any worse recently. She also notes that she checked her blood sugar yesterday and found it to be elevated to 202. She has had a two vaccine series for COVID but has not been boosted.  PERTINENT  PMH / PSH: COVID requiring hospitalization in 2020  OBJECTIVE:   BP (!) 155/88    Pulse 68    Temp 98.8 F (37.1 C) (Oral)    Ht 5\' 1"  (1.549 m)    Wt 145 lb 3.2 oz (65.9 kg)    SpO2 98%    BMI 27.44 kg/m   Physical Exam Vitals reviewed.  Constitutional:      General: She is not in acute distress. Cardiovascular:     Rate and Rhythm: Normal rate and regular rhythm.     Pulses: Normal pulses.     Heart sounds: Normal heart sounds. No murmur heard. Pulmonary:     Effort: Pulmonary effort is normal.     Breath sounds: Normal breath sounds. No wheezing, rhonchi or rales.  Skin:    General: Skin is warm and dry.  Neurological:     General: No focal deficit present.  Psychiatric:        Mood and Affect: Mood normal.     ASSESSMENT/PLAN:   COVID-19 Suspect that ongoing symptoms of malaise, dyspnea, and palpitations are secondary to post-acute COVID. Very low suspicion for PE given HR 68, no hypoxia or CP. EKG with a single PAC which may be contributing to her sensation of palpitations. Otherwise no evidence of ischemic changes on EKG.  - Provided patient with handout on COVID recovery and  post-acute symptoms - Will obtain CBC today as well given history of anemia which could contribute to symptoms - Recommend bivalent booster at next visit if she is feeling better   Type 2 diabetes mellitus without complication, without long-term current use of insulin (HCC) A1c 7.0% today. At goal.      Pearla Dubonnet, MD [place]

## 2021-11-17 NOTE — Assessment & Plan Note (Addendum)
Suspect that ongoing symptoms of malaise, dyspnea, and palpitations are secondary to post-acute COVID. Very low suspicion for PE given HR 68, no hypoxia or CP. EKG with a single PAC which may be contributing to her sensation of palpitations. Otherwise no evidence of ischemic changes on EKG.  - Provided patient with handout on COVID recovery and post-acute symptoms - Will obtain CBC today as well given history of anemia which could contribute to symptoms - Recommend bivalent booster at next visit if she is feeling better

## 2021-11-17 NOTE — Assessment & Plan Note (Signed)
A1c 7.0% today. At goal.

## 2021-11-17 NOTE — Patient Instructions (Addendum)
Ms. Bright It is such a joy to take care you! Thank you for coming in today.   As a reminder, here is a recap of what we talked about today:  - Your EKG looks unchanged from previous EKGs we have on file for you. I am reassured that I do not think your heart is contributing to your symptoms.  - I suspect your symptoms are from your COVID infection a few weeks ago. We call this "Post-Acute COVID" if it last longer than four weeks, we begin to refer to it as "Long COVID." Unfortunately, there is not much we can offer in the way of medications for these symptoms. The best management strategies are to treat your symptoms and to have you rest as needed. Exercise as tolerated may also be helpful in getting back to yourself.  - Your Hemoglobin A1c today is 7.0%, which is at your goal  We are checking some labs today. I will call you if they are abnormal. I will send you a MyChart message or a letter if they are normal.  If you do not hear about your labs in the next 2 weeks please let us know.  Take care and seek immediate care sooner if you develop any concerns.   Marnee Guarneri, MD [place]

## 2021-11-18 ENCOUNTER — Encounter: Payer: Self-pay | Admitting: Student

## 2021-11-18 LAB — CBC
Hematocrit: 42.7 % (ref 34.0–46.6)
Hemoglobin: 14.3 g/dL (ref 11.1–15.9)
MCH: 27.5 pg (ref 26.6–33.0)
MCHC: 33.5 g/dL (ref 31.5–35.7)
MCV: 82 fL (ref 79–97)
Platelets: 405 10*3/uL (ref 150–450)
RBC: 5.2 x10E6/uL (ref 3.77–5.28)
RDW: 14.2 % (ref 11.7–15.4)
WBC: 10.7 10*3/uL (ref 3.4–10.8)

## 2022-01-05 NOTE — Progress Notes (Deleted)
? ?Office Visit Note ? ?Patient: Diana Roach             ?Date of Birth: [birth year]           ?MRN: [id number]             ?PCP: Leeanne Rio, MD ?Referring: Leeanne Rio, MD ?Visit Date: 01/19/2022 ?Occupation: '@GUAROCC'$ @ ? ?Subjective:  ? ? ?History of Present Illness: Diana Roach is a 71 y.o. female with history of osteoarthritis and DDD.  ? ?DEXA was due in January 2023  ? ?Activities of Daily Living:  ?Patient reports morning stiffness for *** {minute/hour:19697}.   ?Patient {ACTIONS;DENIES/REPORTS:21021675::"Denies"} nocturnal pain.  ?Difficulty dressing/grooming: {ACTIONS;DENIES/REPORTS:21021675::"Denies"} ?Difficulty climbing stairs: {ACTIONS;DENIES/REPORTS:21021675::"Denies"} ?Difficulty getting out of chair: {ACTIONS;DENIES/REPORTS:21021675::"Denies"} ?Difficulty using hands for taps, buttons, cutlery, and/or writing: {ACTIONS;DENIES/REPORTS:21021675::"Denies"} ? ?No Rheumatology ROS completed.  ? ?PMFS History:  ?Patient Active Problem List  ? Diagnosis Date Noted  ? Plantar wart of right foot 04/10/2021  ? Abdominal discomfort, epigastric 03/17/2021  ? Chest discomfort 12/25/2020  ? Sinusitis, acute 11/20/2020  ? Rash 02/27/2020  ? Osteopenia 11/23/2019  ? Generalized pain 08/21/2019  ? GERD (gastroesophageal reflux disease) 06/19/2019  ? COVID-19 05/23/2019  ? Palpitations 02/21/2019  ? Type 2 diabetes mellitus without complication, without long-term current use of insulin ([place]) 10/24/2017  ? Varicose vein of leg 03/09/2016  ? UPJ obstruction, congenital 01/28/2016  ? Lumbosacral radiculopathy 01/27/2016  ? Diverticulosis 10/01/2014  ? Radiculopathy, cervical 09/04/2014  ? Bilateral finger numbness 09/04/2014  ? Abdominal pain 04/24/2014  ? Left shoulder pain 07/05/2013  ? Hair loss 07/19/2011  ? Asthma, moderate persistent 05/13/2011  ? Lichen planus 19/50/9326  ? 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 Hypercholesteremia 12/22/2006  ?  ?Past Medical History:  ?Diagnosis  Date  ? Asthma   ? Cataract   ? Diabetes mellitus without complication ([place])   ? Diverticulitis   ? Environmental allergies   ? Lichen planus   ?  ?Family History  ?Problem Relation Age of Onset  ? Hypothyroidism Sister   ? Cervical cancer Sister   ? Fibromyalgia Sister   ? Heart disease Mother   ? Hypertension Brother   ? Lichen planus Brother   ? Cancer Brother   ?     lung  ? Heart attack Brother   ? Diabetes Daughter   ? Cancer Daughter   ?     skin cancer  ? Colon cancer Neg Hx   ? Pancreatic cancer Neg Hx   ? Stomach cancer Neg Hx   ? Esophageal cancer Neg Hx   ? Breast cancer Neg Hx   ? ?Past Surgical History:  ?Procedure Laterality Date  ? APPENDECTOMY  1969  ? [place]  ? lumbar spine  ? CARPAL TUNNEL RELEASE Bilateral   ? CATARACT EXTRACTION, BILATERAL  2020  ? COLONOSCOPY  2015  ? HAND SURGERY Left 2021  ? ?Social History  ? ?Social History Narrative  ? Lives with daughter, son-in-law, grandson  ? House in one level house; has stairs to get into house with handrails, grab bars not present, smoke alarms present; throw rugs with backing  ? Has dog named Willow, chilhuahua/fox terrier  ? Likes to eat variety, meats, vegetables, fruits  ? Caffeine- sodas (Pepsi), water, tea, occasional orange juice  ? Wears seatbelt, wears sunblock  ?   ? Patient likes to swim, go to beach, camping, shopping  ? ?Immunization History  ?Administered Date(s) Administered  ? Fluad Quad(high  Dose 65+) 08/12/2020  ? Influenza Split 11/03/2012  ? Influenza,inj,Quad PF,6+ Mos 09/24/2013, 09/04/2014, 08/11/2016, 07/29/2017, 09/12/2018, 07/24/2019, 08/04/2021  ? PFIZER(Purple Top)SARS-COV-2 Vaccination 07/10/2020, 07/31/2020  ? Pneumococcal Conjugate-13 07/29/2017  ? Pneumococcal Polysaccharide-23 09/12/2018  ? Td 06/14/2008  ? Tdap 10/26/2019  ?  ? ?Objective: ?Vital Signs: There were no vitals taken for this visit.  ? ?Physical Exam ?Vitals and nursing note reviewed.  ?Constitutional:   ?   Appearance: She is  well-developed.  ?HENT:  ?   Head: Normocephalic and atraumatic.  ?Eyes:  ?   Conjunctiva/sclera: Conjunctivae normal.  ?Cardiovascular:  ?   Rate and Rhythm: Normal rate and regular rhythm.  ?   Heart sounds: Normal heart sounds.  ?Pulmonary:  ?   Effort: Pulmonary effort is normal.  ?   Breath sounds: Normal breath sounds.  ?Abdominal:  ?   General: Bowel sounds are normal.  ?   Palpations: Abdomen is soft.  ?Musculoskeletal:  ?   Cervical back: Normal range of motion.  ?Lymphadenopathy:  ?   Cervical: No cervical adenopathy.  ?Skin: ?   General: Skin is warm and dry.  ?   Capillary Refill: Capillary refill takes less than 2 seconds.  ?Neurological:  ?   Mental Status: She is alert and oriented to person, place, and time.  ?Psychiatric:     ?   Behavior: Behavior normal.  ?  ? ?Musculoskeletal Exam: *** ? ?CDAI Exam: ?CDAI Score: -- ?Patient Global: --; Provider Global: -- ?Swollen: --; Tender: -- ?Joint Exam 01/19/2022  ? ?No joint exam has been documented for this visit  ? ?There is currently no information documented on the homunculus. Go to the Rheumatology activity and complete the homunculus joint exam. ? ?Investigation: ?No additional findings. ? ?Imaging: ?No results found. ? ?Recent Labs: ?Lab Results  ?Component Value Date  ? WBC 10.7 11/17/2021  ? HGB 14.3 11/17/2021  ? PLT 405 11/17/2021  ? NA 139 05/21/2021  ? K 4.7 05/21/2021  ? CL 103 05/21/2021  ? CO2 23 05/21/2021  ? GLUCOSE 120 (H) 05/21/2021  ? BUN 13 05/21/2021  ? CREATININE 0.93 05/21/2021  ? BILITOT 0.4 05/21/2021  ? ALKPHOS 108 05/21/2021  ? AST 17 05/21/2021  ? ALT 17 05/21/2021  ? PROT 7.3 05/21/2021  ? ALBUMIN 4.5 05/21/2021  ? CALCIUM 9.8 05/21/2021  ? GFRAA 74 08/21/2019  ? ? ?Speciality Comments: No specialty comments available. ? ?Procedures:  ?No procedures performed ?Allergies: Codeine, Contrast media [iodinated contrast media], Levaquin [levofloxacin], Minocycline, and Augmentin [amoxicillin-pot clavulanate]  ? ?Assessment / Plan:      ?Visit Diagnoses: No diagnosis found. ? ?Orders: ?No orders of the defined types were placed in this encounter. ? ?No orders of the defined types were placed in this encounter. ? ? ?Face-to-face time spent with patient was *** minutes. Greater than 50% of time was spent in counseling and coordination of care. ? ?Follow-Up Instructions: No follow-ups on file. ? ? ?Earnestine Mealing, CMA ? ?Note - This record has been created using [employer].  ?Chart creation errors have been sought, but may not always  ?have been located. Such creation errors do not reflect on  ?the standard of medical care.  ?

## 2022-01-18 ENCOUNTER — Other Ambulatory Visit: Payer: Self-pay | Admitting: Family Medicine

## 2022-01-18 ENCOUNTER — Ambulatory Visit
Admission: RE | Admit: 2022-01-18 | Discharge: 2022-01-18 | Disposition: A | Discharge status: Home / Self Care | Payer: Medicare Other | Source: Ambulatory Visit | Attending: Family Medicine | Admitting: Family Medicine

## 2022-01-18 DIAGNOSIS — R053 Chronic cough: Secondary | ICD-10-CM

## 2022-01-19 ENCOUNTER — Ambulatory Visit: Payer: PPO | Admitting: Physician Assistant

## 2022-01-19 DIAGNOSIS — Z9889 Other specified postprocedural states: Secondary | ICD-10-CM

## 2022-01-19 DIAGNOSIS — Z8616 Personal history of COVID-19: Secondary | ICD-10-CM

## 2022-01-19 DIAGNOSIS — M19071 Primary osteoarthritis, right ankle and foot: Secondary | ICD-10-CM

## 2022-01-19 DIAGNOSIS — L439 Lichen planus, unspecified: Secondary | ICD-10-CM

## 2022-01-19 DIAGNOSIS — Z8261 Family history of arthritis: Secondary | ICD-10-CM

## 2022-01-19 DIAGNOSIS — K5790 Diverticulosis of intestine, part unspecified, without perforation or abscess without bleeding: Secondary | ICD-10-CM

## 2022-01-19 DIAGNOSIS — M720 Palmar fascial fibromatosis [Dupuytren]: Secondary | ICD-10-CM

## 2022-01-19 DIAGNOSIS — M19041 Primary osteoarthritis, right hand: Secondary | ICD-10-CM

## 2022-01-19 DIAGNOSIS — G8929 Other chronic pain: Secondary | ICD-10-CM

## 2022-01-19 DIAGNOSIS — M5136 Other intervertebral disc degeneration, lumbar region: Secondary | ICD-10-CM

## 2022-01-19 DIAGNOSIS — Z8639 Personal history of other endocrine, nutritional and metabolic disease: Secondary | ICD-10-CM

## 2022-01-19 DIAGNOSIS — Z8719 Personal history of other diseases of the digestive system: Secondary | ICD-10-CM

## 2022-01-19 DIAGNOSIS — E119 Type 2 diabetes mellitus without complications: Secondary | ICD-10-CM

## 2022-01-19 DIAGNOSIS — M8589 Other specified disorders of bone density and structure, multiple sites: Secondary | ICD-10-CM

## 2022-01-19 DIAGNOSIS — M7061 Trochanteric bursitis, right hip: Secondary | ICD-10-CM

## 2022-03-17 ENCOUNTER — Other Ambulatory Visit: Payer: Self-pay | Admitting: Family Medicine

## 2022-03-23 DIAGNOSIS — K1370 Unspecified lesions of oral mucosa: Secondary | ICD-10-CM | POA: Diagnosis not present

## 2022-03-26 ENCOUNTER — Ambulatory Visit (INDEPENDENT_AMBULATORY_CARE_PROVIDER_SITE_OTHER): Payer: Medicare Other | Admitting: Student

## 2022-03-26 ENCOUNTER — Encounter: Payer: Self-pay | Admitting: Student

## 2022-03-26 ENCOUNTER — Other Ambulatory Visit: Payer: Self-pay

## 2022-03-26 VITALS — BP 147/79 | HR 70 | Wt 146.0 lb

## 2022-03-26 DIAGNOSIS — E785 Hyperlipidemia, unspecified: Secondary | ICD-10-CM | POA: Diagnosis not present

## 2022-03-26 DIAGNOSIS — U099 Post covid-19 condition, unspecified: Secondary | ICD-10-CM | POA: Diagnosis not present

## 2022-03-26 MED ORDER — ROSUVASTATIN CALCIUM 5 MG PO TABS: 5.0000 mg | ORAL_TABLET | Freq: Every day | ORAL | Status: DC

## 2022-03-26 NOTE — Patient Instructions (Signed)
Diana Roach,  It is so nice to see you again! I'm glad you are going to be my patient going forward.  I am so sorry that you are still having issues with your breathing, I do think this is all from the long COVID.  I am encouraged that you have been able to continue exercising and that it does not seem to be impacting your ability to do the activities that are most important to you at this time.  I will be on the look out for treatments for long COVID as they come available in the research.  In the meantime, the very best thing you can do for your health is to continue working out, 3 times a week is great.  I am also glad to hear that you are going with your daughter, folks to go to the gym with a buddy are more likely to see gait and reaching their fitness goals.  You may continue to use Flonase as needed for your congestion.  Pearla Dubonnet, MD

## 2022-03-26 NOTE — Progress Notes (Signed)
    SUBJECTIVE:   CHIEF COMPLAINT / HPI:   Patient presents today for a "new patient visit".  However, she has been a longtime patient of our clinic since she was in her 58s.  She recently left our clinic and had 1 visit at an outside clinic but found that she did not care much for the outside provider and decided to transfer her care back to our clinic.  She is here today to engage me as her new PCP and has no acute complaints at this time.  Long COVID Patient previously diagnosed with long COVID.  Has had symptoms since January of this year.  Notes that she is getting much better overall but still has periods where she is somewhat short of breath especially after bending over.  Notes that this is not a great impediment to her completing her activities of daily living.  In fact, she recently joined a gym and has been working out 3 times a week at [employer] in [place].  Nasal Congestion Had hand-foot-and-mouth a few weeks ago which she believes that she got from her great-grandchildren.  Presented with a concomitant viral syndrome and used some Flonase which made her oral lesions much worse so she discontinued the Flonase.  In the last couple of days she has had recurrence of the nasal congestion and is wondering if it is wise to restart the Flonase.    OBJECTIVE:   BP (!) 147/79   Pulse 70   Wt 66.2 kg   SpO2 92%   BMI 27.59 kg/m   Physical Exam Vitals reviewed.  Constitutional:      General: She is not in acute distress. HENT:     Mouth/Throat:     Comments: Small, hypopigmented area of mucosal wearing on the right lower dental ridge, consistent with ill fit and wear from her dentures.  No palatal lesions to suggest persistent coxsackievirus infection. Cardiovascular:     Rate and Rhythm: Normal rate and regular rhythm.  Pulmonary:     Effort: Pulmonary effort is normal.     Breath sounds: Normal breath sounds.     ASSESSMENT/PLAN:   Long COVID Improving overall,  however has had symptoms for nearly 6 months at this time.  Does not appear to be impacting her ability to engage in ADLs or activities that are meaningful to her.  Encouraged her to continue her exercise regimen and anticipate further improvement in the coming weeks/months.   Nasal Congestion No active viral infection. Okay to restart IN steroid. Flonase 1 puff/nare daily.   Pearla Dubonnet, MD [place]

## 2022-03-26 NOTE — Assessment & Plan Note (Signed)
Improving overall, however has had symptoms for nearly 6 months at this time.  Does not appear to be impacting her ability to engage in ADLs or activities that are meaningful to her.  Encouraged her to continue her exercise regimen and anticipate further improvement in the coming weeks/months.

## 2022-03-30 ENCOUNTER — Encounter: Payer: Self-pay | Admitting: *Deleted

## 2022-04-13 ENCOUNTER — Other Ambulatory Visit: Payer: Self-pay | Admitting: Family Medicine

## 2022-04-22 ENCOUNTER — Ambulatory Visit: Payer: PPO | Admitting: Physician Assistant

## 2022-04-23 DIAGNOSIS — H903 Sensorineural hearing loss, bilateral: Secondary | ICD-10-CM | POA: Diagnosis not present

## 2022-04-23 DIAGNOSIS — K1370 Unspecified lesions of oral mucosa: Secondary | ICD-10-CM | POA: Diagnosis not present

## 2022-04-26 ENCOUNTER — Ambulatory Visit (INDEPENDENT_AMBULATORY_CARE_PROVIDER_SITE_OTHER): Payer: Medicare Other | Admitting: Student

## 2022-04-26 ENCOUNTER — Encounter: Payer: Self-pay | Admitting: Student

## 2022-04-26 VITALS — BP 130/80 | HR 81 | Ht 61.0 in | Wt 144.0 lb

## 2022-04-26 DIAGNOSIS — E119 Type 2 diabetes mellitus without complications: Secondary | ICD-10-CM

## 2022-04-26 LAB — POCT GLYCOSYLATED HEMOGLOBIN (HGB A1C): Hemoglobin A1C: 6.7 % — AB (ref 4.0–5.6)

## 2022-04-26 NOTE — Assessment & Plan Note (Addendum)
A1c 6.7% today down from 7.0% last check.  Excellent improvement with lifestyle interventions.  Encouraged continued dietary and exercise interventions.  Does need to be on a statin, especially given her hypercholesterolemia combined with diabetes.  Willing to try 5 mg of Crestor.  Hope to titrate up from there. -5 mg rosuvastatin daily -Follow-up in 3 months -Ongoing lifestyle interventions

## 2022-04-26 NOTE — Patient Instructions (Signed)
Ms. Consuegra,  First, congratulations on getting your U2J down!! I am certain that your working out at [employer] has a lot to do with this success. Keep up the hard work and common-sense food choices, you are doing great.  The best thing that you can do for your health at this time is to add a statin to your medicine profile.  "Statin safe eyes."  We can start at a very low-dose of 5 mg daily.  This reduces your risk of having a major heart attack or stroke from 18.9% in the next 10 years to 6.9%.  Enjoyed the singing tonight!  Pearla Dubonnet, MD

## 2022-04-26 NOTE — Progress Notes (Signed)
    SUBJECTIVE:   CHIEF COMPLAINT / HPI:   Diabetes Patient presents today for a diabetic follow-up.  She has been managing her diabetes through lifestyle interventions with diet and exercise.  She recently increased her physical activity, joining [employer] in [place].  She goes there 3 times a week most weeks.  She does admit to a little bit of dietary indiscretion especially over the past week or so but generally tries to eat healthy.  He is not currently on a statin.  She was previously prescribed Crestor 20 mg daily but did not like the way that this made her feel.  Did not have myalgias just generally felt "off."  Last visit we discussed starting at a very low dose of 5 mg a day but she has yet to start this.  We discussed her ASCVD risk and the ability to reduce her risk from 18.9% to 6.9% which she found encouraging.  She is amenable to starting a statin at this low-dose.   OBJECTIVE:   BP 130/80   Pulse 81   Ht '5\' 1"'$  (1.549 m)   Wt 144 lb (65.3 kg)   SpO2 96%   BMI 27.21 kg/m   Physical Exam Vitals reviewed.  Constitutional:      General: She is not in acute distress. Cardiovascular:     Rate and Rhythm: Normal rate and regular rhythm.     Pulses: Normal pulses.  Pulmonary:     Effort: Pulmonary effort is normal.     Breath sounds: Normal breath sounds.  Abdominal:     General: Abdomen is flat.  Skin:    General: Skin is warm and dry.  Neurological:     General: No focal deficit present.      ASSESSMENT/PLAN:   Type 2 diabetes mellitus without complication, without long-term current use of insulin ([facility]) A1c 6.7% today down from 7.0% last check.  Excellent improvement with lifestyle interventions.  Encouraged continued dietary and exercise interventions.  Does need to be on a statin, especially given her hypercholesterolemia combined with diabetes.  Willing to try 5 mg of Crestor.  Hope to titrate up from there. -5 mg rosuvastatin daily -Follow-up in 3  months -Ongoing lifestyle interventions     Pearla Dubonnet, MD [place]

## 2022-04-29 ENCOUNTER — Ambulatory Visit (INDEPENDENT_AMBULATORY_CARE_PROVIDER_SITE_OTHER): Payer: Medicare Other | Admitting: Physician Assistant

## 2022-04-29 DIAGNOSIS — Z1283 Encounter for screening for malignant neoplasm of skin: Secondary | ICD-10-CM | POA: Diagnosis not present

## 2022-04-29 DIAGNOSIS — L439 Lichen planus, unspecified: Secondary | ICD-10-CM

## 2022-04-29 MED ORDER — CLOBETASOL PROPIONATE 0.05 % EX OINT: 1.0000 | TOPICAL_OINTMENT | Freq: Every evening | CUTANEOUS | 4 refills | Status: DC

## 2022-05-06 ENCOUNTER — Other Ambulatory Visit: Payer: Self-pay | Admitting: Gastroenterology

## 2022-05-06 DIAGNOSIS — R1011 Right upper quadrant pain: Secondary | ICD-10-CM | POA: Diagnosis not present

## 2022-05-06 DIAGNOSIS — R0781 Pleurodynia: Secondary | ICD-10-CM | POA: Diagnosis not present

## 2022-05-06 DIAGNOSIS — K5900 Constipation, unspecified: Secondary | ICD-10-CM | POA: Diagnosis not present

## 2022-05-07 ENCOUNTER — Emergency Department (HOSPITAL_COMMUNITY): Payer: Medicare Other

## 2022-05-07 ENCOUNTER — Emergency Department (HOSPITAL_COMMUNITY)
Admission: EM | Admit: 2022-05-07 | Discharge: 2022-05-07 | Disposition: A | Discharge status: Home / Self Care | Payer: Medicare Other | Attending: Emergency Medicine | Admitting: Emergency Medicine

## 2022-05-07 ENCOUNTER — Other Ambulatory Visit: Payer: Self-pay

## 2022-05-07 ENCOUNTER — Encounter (HOSPITAL_COMMUNITY): Payer: Self-pay

## 2022-05-07 DIAGNOSIS — M5126 Other intervertebral disc displacement, lumbar region: Secondary | ICD-10-CM | POA: Diagnosis not present

## 2022-05-07 DIAGNOSIS — I700 Atherosclerosis of aorta: Secondary | ICD-10-CM | POA: Diagnosis not present

## 2022-05-07 DIAGNOSIS — R112 Nausea with vomiting, unspecified: Secondary | ICD-10-CM | POA: Insufficient documentation

## 2022-05-07 DIAGNOSIS — R0781 Pleurodynia: Secondary | ICD-10-CM | POA: Insufficient documentation

## 2022-05-07 DIAGNOSIS — R1011 Right upper quadrant pain: Secondary | ICD-10-CM | POA: Insufficient documentation

## 2022-05-07 DIAGNOSIS — R0602 Shortness of breath: Secondary | ICD-10-CM | POA: Diagnosis not present

## 2022-05-07 DIAGNOSIS — Z9049 Acquired absence of other specified parts of digestive tract: Secondary | ICD-10-CM | POA: Diagnosis not present

## 2022-05-07 DIAGNOSIS — K5730 Diverticulosis of large intestine without perforation or abscess without bleeding: Secondary | ICD-10-CM | POA: Diagnosis not present

## 2022-05-07 DIAGNOSIS — R109 Unspecified abdominal pain: Secondary | ICD-10-CM

## 2022-05-07 LAB — COMPREHENSIVE METABOLIC PANEL
ALT: 21 U/L (ref 0–44)
AST: 26 U/L (ref 15–41)
Albumin: 4.4 g/dL (ref 3.5–5.0)
Alkaline Phosphatase: 87 U/L (ref 38–126)
Anion gap: 11 (ref 5–15)
BUN: 8 mg/dL (ref 8–23)
CO2: 23 mmol/L (ref 22–32)
Calcium: 9.7 mg/dL (ref 8.9–10.3)
Chloride: 104 mmol/L (ref 98–111)
Creatinine, Ser: 0.94 mg/dL (ref 0.44–1.00)
GFR, Estimated: >60 mL/min (ref >60)
Glucose, Bld: 134 mg/dL — ABNORMAL HIGH (ref 70–99)
Potassium: 3.9 mmol/L (ref 3.5–5.1)
Sodium: 138 mmol/L (ref 135–145)
Total Bilirubin: 0.8 mg/dL (ref 0.3–1.2)
Total Protein: 7.6 g/dL (ref 6.5–8.1)

## 2022-05-07 LAB — CBC WITH DIFFERENTIAL/PLATELET
Abs Immature Granulocytes: 0.04 10*3/uL (ref 0.00–0.07)
Basophils Absolute: 0.1 10*3/uL (ref 0.0–0.1)
Basophils Relative: 1 %
Eosinophils Absolute: 0.2 10*3/uL (ref 0.0–0.5)
Eosinophils Relative: 2 %
HCT: 43.7 % (ref 36.0–46.0)
Hemoglobin: 14.2 g/dL (ref 12.0–15.0)
Immature Granulocytes: 0 %
Lymphocytes Relative: 20 %
Lymphs Abs: 2.4 10*3/uL (ref 0.7–4.0)
MCH: 27.1 pg (ref 26.0–34.0)
MCHC: 32.5 g/dL (ref 30.0–36.0)
MCV: 83.4 fL (ref 80.0–100.0)
Monocytes Absolute: 0.5 10*3/uL (ref 0.1–1.0)
Monocytes Relative: 4 %
Neutro Abs: 8.7 10*3/uL — ABNORMAL HIGH (ref 1.7–7.7)
Neutrophils Relative %: 73 %
Platelets: 389 10*3/uL (ref 150–400)
RBC: 5.24 MIL/uL — ABNORMAL HIGH (ref 3.87–5.11)
RDW: 14.3 % (ref 11.5–15.5)
WBC: 11.9 10*3/uL — ABNORMAL HIGH (ref 4.0–10.5)
nRBC: 0 % (ref 0.0–0.2)

## 2022-05-07 LAB — URINALYSIS, ROUTINE W REFLEX MICROSCOPIC
Bacteria, UA: NONE SEEN
Bilirubin Urine: NEGATIVE
Glucose, UA: NEGATIVE mg/dL
Hgb urine dipstick: NEGATIVE
Ketones, ur: 5 mg/dL — AB
Leukocytes,Ua: NEGATIVE
Nitrite: NEGATIVE
Protein, ur: NEGATIVE mg/dL
Specific Gravity, Urine: 1.004 — ABNORMAL LOW (ref 1.005–1.030)
pH: 7 (ref 5.0–8.0)

## 2022-05-07 LAB — LIPASE, BLOOD: Lipase: 29 U/L (ref 11–51)

## 2022-05-07 MED ORDER — ONDANSETRON HCL 4 MG PO TABS: 4.0000 mg | ORAL_TABLET | Freq: Four times a day (QID) | ORAL | 0 refills | Status: DC

## 2022-05-07 MED ORDER — SODIUM CHLORIDE 0.9 % IV BOLUS
1000.0000 mL | Freq: Once | INTRAVENOUS | Status: AC
  Administered 2022-05-07: 1000 mL via INTRAVENOUS

## 2022-05-07 MED ORDER — KETOROLAC TROMETHAMINE 30 MG/ML IJ SOLN
15.0000 mg | Freq: Once | INTRAMUSCULAR | Status: AC
  Administered 2022-05-07: 15 mg via INTRAVENOUS
  Filled 2022-05-07: qty 1

## 2022-05-07 NOTE — ED Provider Notes (Signed)
[facility] EMERGENCY DEPARTMENT Provider Note   CSN: [id number] Arrival date & time: 05/07/22  1051     History  Chief Complaint  Patient presents with   Abdominal Pain    Diana Roach is a 71 y.o. female.  71 year old female with prior medical history as detailed below presents for evaluation.  Patient complains of mild right-sided flank and right upper quadrant discomfort.  Symptoms have been ongoing for the last 4 days.  She denies any specific inciting injury.  She denies associated fever.  She does report some mild intermittent nausea.  Patient seen by GI -Dr. Benson Norway -yesterday for same complaint.  Patient is concerned that she may have gallbladder pathology.  She denies prior history of gallbladder problems including gallstones, infection, etc.  Prior abdominal surgeries include appendectomy in the distant past.  The history is provided by the patient and medical records.  Abdominal Pain Pain location:  R flank Pain quality: aching   Pain radiates to:  Does not radiate Pain severity:  Mild Onset quality:  Gradual Duration:  4 days Timing:  Constant Progression:  Unchanged Chronicity:  New      Home Medications Prior to Admission medications   Medication Sig Start Date End Date Taking? Authorizing Provider  ondansetron (ZOFRAN) 4 MG tablet Take 1 tablet (4 mg total) by mouth every 6 (six) hours. 05/07/22  Yes Valarie Merino, MD  acetaminophen (TYLENOL) 325 MG tablet Take 650 mg by mouth every 6 (six) hours as needed for mild pain or headache.    [provider]  albuterol (VENTOLIN HFA) 108 (90 Base) MCG/ACT inhaler INHALE 2 PUFFS INTO THE LUNGS EVERY 6 HOURS AS NEEDED FOR WHEEZING FOR SHORTNESS OF BREATH Patient taking differently: as needed. 07/03/20   Leeanne Rio, MD  clobetasol ointment (TEMOVATE) 9.37 % Apply 1 Application topically at bedtime. 04/29/22   Sheffield, Kelli R, PA-C  ELDERBERRY PO Take by mouth as needed.     [provider]  EQ LORATADINE 10 MG tablet Take 1 tablet by mouth once daily 01/26/21   Leeanne Rio, MD  FLOVENT [facility] 110 MCG/ACT inhaler Inhale 2 puffs by mouth twice daily 08/06/21   Leeanne Rio, MD  Multiple Vitamin (MULTIVITAMIN PO) Take by mouth daily.    [provider]  omeprazole (PRILOSEC) 20 MG capsule Take 1 capsule by mouth once daily 04/13/22   Eppie Gibson, MD  polyethylene glycol (MIRALAX / GLYCOLAX) 17 g packet Take 17 g by mouth daily. 03/12/21   Daisy Floro, DO  rosuvastatin (CRESTOR) 5 MG tablet Take 1 tablet (5 mg total) by mouth daily. 03/26/22   Eppie Gibson, MD  senna (SENOKOT) 8.6 MG TABS tablet Take 1 tablet (8.6 mg total) by mouth daily. Patient taking differently: Take 1 tablet by mouth as needed. 03/12/21   Daisy Floro, DO  VITAMIN D PO Take by mouth daily.    [provider]      Allergies    Codeine, Contrast media [iodinated contrast media], Levaquin [levofloxacin], Minocycline, and Augmentin [amoxicillin-pot clavulanate]    Review of Systems   Review of Systems  Gastrointestinal:  Positive for abdominal pain.  All other systems reviewed and are negative.   Physical Exam Updated Vital Signs BP (!) 161/81   Pulse (!) 58   Temp 98 F (36.7 C) (Oral)   Resp 17   Ht '5\' 1"'$  (1.549 m)   Wt 65.3 kg   SpO2 97%  BMI 27.21 kg/m  Physical Exam Vitals and nursing note reviewed.  Constitutional:      General: She is not in acute distress.    Appearance: Normal appearance. She is well-developed.  HENT:     Head: Normocephalic and atraumatic.  Eyes:     Conjunctiva/sclera: Conjunctivae normal.     Pupils: Pupils are equal, round, and reactive to light.  Cardiovascular:     Rate and Rhythm: Normal rate and regular rhythm.     Heart sounds: Normal heart sounds.  Pulmonary:     Effort: Pulmonary effort is normal. No respiratory distress.     Breath sounds: Normal breath sounds.  Abdominal:      General: There is no distension.     Palpations: Abdomen is soft.     Tenderness: There is no abdominal tenderness.     Comments: Nonspecific diffuse very mild tenderness with palpation along the right flank, right lower ribs, and right upper quadrant.  No overlying rash or erythema.    Musculoskeletal:        General: No deformity. Normal range of motion.     Cervical back: Normal range of motion and neck supple.  Skin:    General: Skin is warm and dry.  Neurological:     General: No focal deficit present.     Mental Status: She is alert and oriented to person, place, and time.     ED Results / Procedures / Treatments   Labs (all labs ordered are listed, but only abnormal results are displayed) Labs Reviewed  CBC WITH DIFFERENTIAL/PLATELET - Abnormal; Notable for the following components:      Result Value   WBC 11.9 (*)    RBC 5.24 (*)    Neutro Abs 8.7 (*)    All other components within normal limits  COMPREHENSIVE METABOLIC PANEL - Abnormal; Notable for the following components:   Glucose, Bld 134 (*)    All other components within normal limits  URINALYSIS, ROUTINE W REFLEX MICROSCOPIC - Abnormal; Notable for the following components:   Color, Urine STRAW (*)    Specific Gravity, Urine 1.004 (*)    Ketones, ur 5 (*)    All other components within normal limits  LIPASE, BLOOD    EKG None  Radiology CT ABDOMEN PELVIS WO CONTRAST  Result Date: 05/07/2022 CLINICAL DATA:  Flank pain, kidney stone suspected right flank pain EXAM: CT ABDOMEN AND PELVIS WITHOUT CONTRAST TECHNIQUE: Multidetector CT imaging of the abdomen and pelvis was performed following the standard protocol without IV contrast. RADIATION DOSE REDUCTION: This exam was performed according to the departmental dose-optimization program which includes automated exposure control, adjustment of the mA and/or kV according to patient size and/or use of iterative reconstruction technique. COMPARISON:  Correlation  is made with the ultrasound of the right upper quadrant on the same day earlier FINDINGS: Lower chest: No acute abnormality. Hepatobiliary: No focal liver abnormality is seen. No gallstones, gallbladder wall thickening, or biliary dilatation. Pancreas: Unremarkable. No pancreatic ductal dilatation or surrounding inflammatory changes. Spleen: Normal in size without focal abnormality. Adrenals/Urinary Tract: Adrenal glands are unremarkable. Kidneys are normal, without renal calculi, focal lesion, or hydronephrosis. Extrarenal sinus of the right side. Bladder is unremarkable. Stomach/Bowel: Stomach is within normal limits. Status post appendectomy. No evidence of bowel wall thickening, distention, or inflammatory changes. Moderate stool burden in the cecum and ascending colon. Mild to moderate diverticulosis of the sigmoid colon without diverticulitis. Vascular/Lymphatic: Moderate atheromatous calcifications of the infrarenal abdominal aorta extending into  the iliac arteries. No enlarged abdominal or pelvic lymph nodes. Reproductive: Uterus and bilateral adnexa are unremarkable. Other: No abdominal wall hernia or abnormality. No abdominopelvic ascites. Musculoskeletal: No acute or significant osseous findings. There is some disc bulging seen at L2-L3 and L3-L4 with focal calcifications of the posterior longitudinal ligament at these disc levels. IMPRESSION: 1. No cholelithiasis or cholecystitis. No pericholecystic stranding. 2. Bilateral kidneys with attention of the right side have a normal appearance without nephroureterolithiasis or hydroureteronephrosis. Right extrarenal pelvis, a benign anatomical variation. 3. Status post appendectomy. Moderate stool burden in the cecum and ascending colon. Bowel-gas pattern is nonobstructive. Mild to moderate diverticulosis of the sigmoid colon without diverticulitis. 4. No free air, fluid, inflammatory changes, mass or significant lymphadenopathy seen. Electronically Signed    By: Frazier Richards M.D.   On: 05/07/2022 17:11   US Abdomen Limited RUQ (LIVER/GB)  Result Date: 05/07/2022 CLINICAL DATA:  Nausea and vomiting EXAM: ULTRASOUND ABDOMEN LIMITED RIGHT UPPER QUADRANT COMPARISON:  CT 01/25/2016 FINDINGS: Gallbladder: No gallstones or wall thickening visualized. A positive sonographic Murphy sign noted by sonographer. Common bile duct: Diameter: 4 mm. Liver: No focal lesion identified. Diffusely increased hepatic parenchymal echogenicity. Portal vein is patent on color Doppler imaging with normal direction of blood flow towards the liver. Other: None. IMPRESSION: 1. Positive sonographic Percell Miller sign was noted by the sonographer. However, there are no other sonographic findings to suggest acute cholecystitis. Nuclear medicine hepatobiliary scan may be helpful to further assess, as clinically indicated. 2. The echogenicity of the liver is increased. This is a nonspecific finding but is most commonly seen with fatty infiltration of the liver. There are no obvious focal liver lesions. Electronically Signed   By: Davina Poke D.O.   On: 05/07/2022 12:57   DG Chest 2 View  Result Date: 05/07/2022 CLINICAL DATA:  Provided history: Shortness of breath. EXAM: CHEST - 2 VIEW COMPARISON:  Prior chest radiograph 01/18/2022 and earlier. FINDINGS: Heart size within normal limits. No appreciable airspace consolidation or pulmonary edema. No evidence of pleural effusion or pneumothorax. No acute bony abnormality identified. Degenerative changes of the spine. IMPRESSION: No evidence of acute cardiopulmonary abnormality. Electronically Signed   By: Kellie Simmering D.O.   On: 05/07/2022 12:05    Procedures Procedures    Medications Ordered in ED Medications  ketorolac (TORADOL) 30 MG/ML injection 15 mg (15 mg Intravenous Given 05/07/22 1643)  sodium chloride 0.9 % bolus 1,000 mL (0 mLs Intravenous Stopped 05/07/22 1819)    ED Course/ Medical Decision Making/ A&P                            Medical Decision Making Amount and/or Complexity of Data Reviewed Radiology: ordered.  Risk Prescription drug management.    Medical Screen Complete  This patient presented to the ED with complaint of right flank, right upper quadrant abdominal pain.  This complaint involves an extensive number of treatment options. The initial differential diagnosis includes, but is not limited to, gallbladder pathology, intra-abdominal pathology, pulmonary pathology, metabolic abnormality, etc.  This presentation is: Acute, Self-Limited, Previously Undiagnosed, Uncertain Prognosis, Complicated, Systemic Symptoms, and Threat to Life/Bodily Function  Patient is presenting with nonspecific right flank discomfort  Exam is not suggestive of significant acute pathology.  Screening labs obtained are without significant acute abnormality.  Imaging obtained is without significant abnormality.  Patient feels significantly improved after evaluation and administration of Toradol and IV fluids.  Patient does have established  outpatient care providers including GI and PCP.  She is advised to closely follow-up with same.  Reports of close follow-up stressed.  Strict return precautions given and understood.  Additional history obtained:  External records from outside sources obtained and reviewed including prior ED visits and prior Inpatient records.    Lab Tests:  I ordered and personally interpreted labs.  The pertinent results include: CBC CMP, lipase, UA   Imaging Studies ordered:  I ordered imaging studies including right upper quadrant ultrasound, CT abdomen pelvis I independently visualized and interpreted obtained imaging which showed no acute pathology I agree with the radiologist interpretation.   Cardiac Monitoring:  The patient was maintained on a cardiac monitor.  I personally viewed and interpreted the cardiac monitor which showed an underlying rhythm of: NSR   Medicines  ordered:  I ordered medication including IV fluids, Toradol for pain, suspected dehydration Reevaluation of the patient after these medicines showed that the patient: resolved  Problem List / ED Course:  Right flank pain   Reevaluation:  After the interventions noted above, I reevaluated the patient and found that they have: resolved    Disposition:  After consideration of the diagnostic results and the patients response to treatment, I feel that the patent would benefit from close outpatient follow-up.          Final Clinical Impression(s) / ED Diagnoses Final diagnoses:  Right flank pain    Rx / DC Orders ED Discharge Orders          Ordered    ondansetron (ZOFRAN) 4 MG tablet  Every 6 hours        05/07/22 1821              Valarie Merino, MD 05/07/22 1825

## 2022-05-07 NOTE — ED Provider Triage Note (Signed)
Emergency Medicine Provider Triage Evaluation Note  Diana Roach , a 71 y.o. female  was evaluated in triage.  Pt complains of right upper quadrant abdominal pain.  Patient states symptoms started 3 days ago.  She first noticed some epigastric pain that is now on her right upper quadrant.  She says that she was seen by her primary doctor who was concerned for her gallbladder.  Apparently they did labs which were pretty unremarkable.  She was post to get an outpatient gallbladder ultrasound, however pain has been so bad that patient does not think she can wait any longer.  She does have associated shortness of breath.  She is also endorses some increased frequency in urination.  No other urinary symptoms noted. Endorses chills but no known fevers.  Review of Systems  Positive: Chills, abdominal pain, flank pain, shortness of breath, urinary frequency Negative:   Physical Exam  BP (!) 164/93 (BP Location: Right Arm)   Pulse 67   Temp (!) 97.5 F (36.4 C) (Oral)   Resp 18   Ht '5\' 1"'$  (1.549 m)   Wt 65.3 kg   SpO2 94%   BMI 27.21 kg/m  Gen:   Awake, no distress   Resp:  Normal effort  MSK:   Moves extremities without difficulty  Other:  She does have right upper quadrant tenderness that is actually more prominent when I am palpating on the right flank or over the rib cage. Murphy sign is negative. No other abdominal pain.   Medical Decision Making  Medically screening exam initiated at 11:45 AM.  Appropriate orders placed.  Diana Roach was informed that the remainder of the evaluation will be completed by another provider, this initial triage assessment does not replace that evaluation, and the importance of remaining in the ED until their evaluation is complete.  Pain seems to be originating from lung, gallbladder, or renal in etiology based on exam.    Adolphus Birchwood, PA-C 05/07/22 1148

## 2022-05-07 NOTE — ED Triage Notes (Signed)
Pt c/o right flank pain radiating into right upper back x 4 days. Pt has had constipation. Pt seen for same yesterday and was told it may be her gallbladder. Pt has had nausea, denies vomiting.

## 2022-05-07 NOTE — Discharge Instructions (Signed)
Return for any problem.  ?

## 2022-05-10 ENCOUNTER — Other Ambulatory Visit: Payer: Self-pay | Admitting: Student

## 2022-05-11 ENCOUNTER — Other Ambulatory Visit: Payer: Self-pay

## 2022-05-12 MED ORDER — ALBUTEROL SULFATE HFA 108 (90 BASE) MCG/ACT IN AERS: INHALATION_SPRAY | RESPIRATORY_TRACT | 3 refills | Status: DC

## 2022-05-18 ENCOUNTER — Ambulatory Visit (INDEPENDENT_AMBULATORY_CARE_PROVIDER_SITE_OTHER): Payer: Medicare Other | Admitting: Family Medicine

## 2022-05-18 ENCOUNTER — Encounter: Payer: Self-pay | Admitting: Family Medicine

## 2022-05-18 VITALS — BP 127/77 | HR 67 | Ht 61.0 in | Wt 143.4 lb

## 2022-05-18 DIAGNOSIS — R1011 Right upper quadrant pain: Secondary | ICD-10-CM

## 2022-05-18 NOTE — Patient Instructions (Signed)
Thank you for coming to see me today. It was a pleasure. Today we discussed your belly pain, I am unsure what is causing it -could be the gall bladder, muscle strain, liver etc but lets do some further work up. I recommend tylenol '650mg'$  every 4-6 hrs for the pain   Please follow-up with [place] or Dr Benson Norway if no improvement in symptoms   If you have any questions or concerns, please do not hesitate to call the office at [phone].  Best wishes,   Dr Posey Pronto

## 2022-05-18 NOTE — Progress Notes (Signed)
     SUBJECTIVE:   CHIEF COMPLAINT / HPI:   Diana Roach is a 71 y.o. female presents for RUQ pain  RUQ pain Started exactly 2 weeks ago. She has just eating greasy chicken and pizza. Aching pain and radiates to the back. Came on suddenly, worsening gradually. 8-10/10 severity. Heating pad and tylenol improves the pain. Constant but worse at times. Movement worsens pt. Unable to lay on right side and the pain wakes her up from her sleep. Denies fevers. Pt reports some on going constipation and regurgitation.  Denies lower abdominal pain, dysuria, frequency, urgency or hematuria.  Denies  nausea,vomiting, diarrhea rectal bleeding or melena. She saw Dr Diana Roach who recommended follow up with PCP. He gave her a medicine which she does know. Denies falls or injuries.  [place] Office Visit from 05/18/2022 in [place]  PHQ-9 Total Score 2         PERTINENT  PMH / PSH: lichen planus, asthma  OBJECTIVE:   BP 127/77   Pulse 67   Ht '5\' 1"'$  (1.549 m)   Wt 143 lb 6.4 oz (65 kg)   SpO2 98%   BMI 27.10 kg/m    General: Alert, no acute distress Cardio: Normal S1 and S2, RRR, no r/m/g Pulm: CTAB, normal work of breathing Abdomen: Bowel sounds normal. Abdomen soft, nondistended, tender in the right upper quadrant.  Murphy's negative Extremities: No peripheral edema.  Neuro: Cranial nerves grossly intact   ASSESSMENT/PLAN:   RUQ pain Unclear cause of right upper quadrant pain.  Broad differentials including biliary colic, cholecystitis, hepatitis, gastritis/peptic ulcer disease, chronic costochondritis etc.  Low suspicion for PE given no chest pain, dyspnea, tachycardia or hypoxia.  On exam today TTP RUQ, however Murphy's sign negative.  At recent ED visit right upper quadrant ultrasound showed positive sonographic Murphy sign but no other signs of acute cholecystitis.  A HIDA scan was recommended however patient did not receive this in the ED.  Recommended  general blood work today including CMP, hepatitis panel and lipase.  Given the patient's symptoms are improving with Tylenol I recommended her to continue this: Tylenol 650 mg every 4-6 hours for pain.  Recommend follow-up with Dr. Allene Roach or PCP if no improvement in symptoms.  May need general surgery referral.    [name], MD PGY-3 [place]

## 2022-05-19 ENCOUNTER — Encounter: Payer: Self-pay | Admitting: Physician Assistant

## 2022-05-19 LAB — COMPREHENSIVE METABOLIC PANEL
ALT: 17 IU/L (ref 0–32)
AST: 21 IU/L (ref 0–40)
Albumin/Globulin Ratio: 1.8 (ref 1.2–2.2)
Albumin: 5 g/dL — ABNORMAL HIGH (ref 3.8–4.8)
Alkaline Phosphatase: 107 IU/L (ref 44–121)
BUN/Creatinine Ratio: 11 — ABNORMAL LOW (ref 12–28)
BUN: 11 mg/dL (ref 8–27)
Bilirubin Total: 0.4 mg/dL (ref 0.0–1.2)
CO2: 22 mmol/L (ref 20–29)
Calcium: 10.2 mg/dL (ref 8.7–10.3)
Chloride: 105 mmol/L (ref 96–106)
Creatinine, Ser: 1.02 mg/dL — ABNORMAL HIGH (ref 0.57–1.00)
Globulin, Total: 2.8 g/dL (ref 1.5–4.5)
Glucose: 117 mg/dL — ABNORMAL HIGH (ref 70–99)
Potassium: 5 mmol/L (ref 3.5–5.2)
Sodium: 142 mmol/L (ref 134–144)
Total Protein: 7.8 g/dL (ref 6.0–8.5)
eGFR: 59 mL/min/{1.73_m2} — ABNORMAL LOW (ref >59)

## 2022-05-19 LAB — ACUTE VIRAL HEPATITIS (HAV, HBV, HCV)
HCV Ab: NONREACTIVE
Hep A IgM: NEGATIVE
Hep B C IgM: NEGATIVE
Hepatitis B Surface Ag: NEGATIVE

## 2022-05-19 LAB — LIPASE: Lipase: 31 U/L (ref 14–85)

## 2022-05-19 LAB — HCV INTERPRETATION

## 2022-05-19 NOTE — Progress Notes (Signed)
   Follow-Up Visit   Subjective  Diana Roach is a 71 y.o. female who presents for the following: Annual Exam (Here for annual skin exam. Concerns she is having a break out of her lichen planus on her thighs. Also has an itchy lesion between breasts that she said we froze last time. Needs refill on her clobetasol.).   The following portions of the chart were reviewed this encounter and updated as appropriate:  Tobacco  Allergies  Meds  Problems  Med Hx  Surg Hx  Fam Hx      Objective  Well appearing patient in no apparent distress; mood and affect are within normal limits.  A full examination was performed including scalp, head, eyes, ears, nose, lips, neck, chest, axillae, abdomen, back, buttocks, bilateral upper extremities, bilateral lower extremities, hands, feet, fingers, toes, fingernails, and toenails. All findings within normal limits unless otherwise noted below.  No atypical nevi No signs of non-mole skin cancer.   Left Thigh - Anterior, Right Hand - Posterior, Right Thigh - Anterior Pink plaques with thin scale   Assessment & Plan  Lichen planus Right Hand - Posterior; Left Thigh - Anterior; Right Thigh - Anterior  clobetasol ointment (TEMOVATE) 0.05 % - Left Thigh - Anterior, Right Hand - Posterior, Right Thigh - Anterior Apply 1 Application topically at bedtime.  Screening for malignant neoplasm of skin  Yearly skin exams.    I, Adon Gehlhausen, PA-C, have reviewed all documentation's for this visit.  The documentation on 05/19/22 for the exam, diagnosis, procedures and orders are all accurate and complete.

## 2022-05-20 DIAGNOSIS — R1011 Right upper quadrant pain: Secondary | ICD-10-CM | POA: Insufficient documentation

## 2022-05-20 NOTE — Assessment & Plan Note (Addendum)
Unclear cause of right upper quadrant pain.  Broad differentials including biliary colic, cholecystitis, hepatitis, gastritis/peptic ulcer disease, chronic costochondritis etc.  Low suspicion for PE given no chest pain, dyspnea, tachycardia or hypoxia.  On exam today TTP RUQ, however Murphy's sign negative.  At recent ED visit right upper quadrant ultrasound showed positive sonographic Murphy sign but no other signs of acute cholecystitis.  A HIDA scan was recommended however patient did not receive this in the ED.  Recommended general blood work today including CMP, hepatitis panel and lipase.  Given the patient's symptoms are improving with Tylenol I recommended her to continue this: Tylenol 650 mg every 4-6 hours for pain.  Recommend follow-up with Dr. Allene Pyo or PCP if no improvement in symptoms.  May need general surgery referral.

## 2022-05-24 ENCOUNTER — Other Ambulatory Visit: Payer: Self-pay | Admitting: Family Medicine

## 2022-05-24 ENCOUNTER — Other Ambulatory Visit: Payer: Self-pay | Admitting: Student

## 2022-05-24 DIAGNOSIS — Z1231 Encounter for screening mammogram for malignant neoplasm of breast: Secondary | ICD-10-CM

## 2022-06-10 ENCOUNTER — Other Ambulatory Visit: Payer: Self-pay | Admitting: Student

## 2022-06-24 ENCOUNTER — Ambulatory Visit: Payer: Medicare Other

## 2022-06-24 ENCOUNTER — Ambulatory Visit
Admission: RE | Admit: 2022-06-24 | Discharge: 2022-06-24 | Disposition: A | Discharge status: Home / Self Care | Payer: Medicare Other | Source: Ambulatory Visit | Attending: Family Medicine | Admitting: Family Medicine

## 2022-06-24 DIAGNOSIS — Z1231 Encounter for screening mammogram for malignant neoplasm of breast: Secondary | ICD-10-CM

## 2022-07-09 ENCOUNTER — Other Ambulatory Visit: Payer: Self-pay | Admitting: Student

## 2022-08-07 ENCOUNTER — Other Ambulatory Visit: Payer: Self-pay | Admitting: Family Medicine

## 2022-09-01 ENCOUNTER — Ambulatory Visit (INDEPENDENT_AMBULATORY_CARE_PROVIDER_SITE_OTHER): Payer: Medicare Other

## 2022-09-01 DIAGNOSIS — Z23 Encounter for immunization: Secondary | ICD-10-CM | POA: Diagnosis not present

## 2022-09-02 NOTE — Progress Notes (Signed)
Patient presents to nurse clinic for flu vaccination. Administered in RD, site unremarkable, tolerated injection well.   Talbot Grumbling, RN

## 2022-09-11 ENCOUNTER — Other Ambulatory Visit: Payer: Self-pay | Admitting: Student

## 2022-10-05 DIAGNOSIS — J069 Acute upper respiratory infection, unspecified: Secondary | ICD-10-CM | POA: Diagnosis not present

## 2022-10-05 DIAGNOSIS — R0981 Nasal congestion: Secondary | ICD-10-CM | POA: Diagnosis not present

## 2022-10-05 DIAGNOSIS — R059 Cough, unspecified: Secondary | ICD-10-CM | POA: Diagnosis not present

## 2022-10-06 ENCOUNTER — Telehealth: Payer: Self-pay | Admitting: Pharmacist

## 2022-10-06 NOTE — Progress Notes (Signed)
[place] Team  Medication Adherence Quality Measures  10/06/22  Diana Roach 28-Jul-1951  Provider:  Dr. Joelyn Oms  Practice:  [place]  Medication Adherence Measure(s):   Medication Adherence for Cholesterol Medication  Medication Adherence Findings:  Late to fill rosuvastatin 5 mg previously prescribed by outside PCP. Patient admits that she is not taking the rosuvastatin prescribed by Dr. Joelyn Oms, per her preference. Wishes to have labs redrawn prior to filling medication. I encouraged the patient to schedule a follow up office visit with Dr. Joelyn Oms. Patient states that she will. All questions answered and patient appreciative of my call.   Diana Roach, PharmD [place] Pharmacist Office: [phone]

## 2022-10-16 ENCOUNTER — Other Ambulatory Visit: Payer: Self-pay | Admitting: Student

## 2022-11-02 ENCOUNTER — Other Ambulatory Visit: Payer: Self-pay | Admitting: Student

## 2022-11-14 ENCOUNTER — Other Ambulatory Visit: Payer: Self-pay | Admitting: Student

## 2022-11-17 ENCOUNTER — Encounter: Payer: Self-pay | Admitting: Student

## 2022-11-17 ENCOUNTER — Ambulatory Visit
Admission: RE | Admit: 2022-11-17 | Discharge: 2022-11-17 | Disposition: A | Discharge status: Home / Self Care | Payer: 59 | Source: Ambulatory Visit | Attending: Family Medicine | Admitting: Family Medicine

## 2022-11-17 ENCOUNTER — Ambulatory Visit (INDEPENDENT_AMBULATORY_CARE_PROVIDER_SITE_OTHER): Payer: 59 | Admitting: Student

## 2022-11-17 ENCOUNTER — Other Ambulatory Visit: Payer: Self-pay

## 2022-11-17 VITALS — BP 167/91 | HR 81 | Ht 61.0 in | Wt 147.0 lb

## 2022-11-17 DIAGNOSIS — M25552 Pain in left hip: Secondary | ICD-10-CM

## 2022-11-17 DIAGNOSIS — M8580 Other specified disorders of bone density and structure, unspecified site: Secondary | ICD-10-CM

## 2022-11-17 DIAGNOSIS — M79605 Pain in left leg: Secondary | ICD-10-CM | POA: Diagnosis not present

## 2022-11-17 DIAGNOSIS — I152 Hypertension secondary to endocrine disorders: Secondary | ICD-10-CM | POA: Diagnosis not present

## 2022-11-17 DIAGNOSIS — E1159 Type 2 diabetes mellitus with other circulatory complications: Secondary | ICD-10-CM

## 2022-11-17 DIAGNOSIS — E119 Type 2 diabetes mellitus without complications: Secondary | ICD-10-CM

## 2022-11-17 MED ORDER — AMLODIPINE-OLMESARTAN 5-20 MG PO TABS: 1.0000 | ORAL_TABLET | Freq: Every day | ORAL | 1 refills | Status: DC

## 2022-11-17 NOTE — Patient Instructions (Addendum)
Change shoes!   I'm getting a hip X ray. This will be done at [place] over in the [facility]. [place] They are on the first floor. You can walk in to have this done. They are open M-F business hours.  In the meantime, physical therapy is always a good idea. They will call you to set this up.  The X ray should tell us if this needs intervention or just changes in mechanics (shoes and PT).   Please call the breast center at [phone] to schedule your DEXA scan.  I still want you to be sure that you are taking your Crestor! Even if you have to start with every other day, this would be better than not at all.  I'm starting a BP medicine for you. Take this once per day. We will see you back next month.  Pearla Dubonnet, MD

## 2022-11-17 NOTE — Progress Notes (Unsigned)
    SUBJECTIVE:   CHIEF COMPLAINT / HPI:   L hip/knee pain. New shoes around that time. "[employer].:   Hip pain see  Not taking Crestor.  PERTINENT  PMH / PSH: ***  OBJECTIVE:   BP (!) 152/85   Pulse 81   Ht '5\' 1"'$  (1.549 m)   Wt 147 lb (66.7 kg)   SpO2 97%   BMI 27.78 kg/m   ***  ASSESSMENT/PLAN:   No problem-specific Assessment & Plan notes found for this encounter.     Pearla Dubonnet, MD [place]

## 2022-11-18 DIAGNOSIS — I10 Essential (primary) hypertension: Secondary | ICD-10-CM | POA: Insufficient documentation

## 2022-11-18 DIAGNOSIS — M79606 Pain in leg, unspecified: Secondary | ICD-10-CM | POA: Insufficient documentation

## 2022-11-18 DIAGNOSIS — I152 Hypertension secondary to endocrine disorders: Secondary | ICD-10-CM | POA: Insufficient documentation

## 2022-11-18 HISTORY — DX: Essential (primary) hypertension: I10

## 2022-11-18 LAB — BASIC METABOLIC PANEL
BUN/Creatinine Ratio: 11 — ABNORMAL LOW (ref 12–28)
BUN: 11 mg/dL (ref 8–27)
CO2: 19 mmol/L — ABNORMAL LOW (ref 20–29)
Calcium: 10.1 mg/dL (ref 8.7–10.3)
Chloride: 104 mmol/L (ref 96–106)
Creatinine, Ser: 1.04 mg/dL — ABNORMAL HIGH (ref 0.57–1.00)
Glucose: 107 mg/dL — ABNORMAL HIGH (ref 70–99)
Potassium: 4.9 mmol/L (ref 3.5–5.2)
Sodium: 141 mmol/L (ref 134–144)
eGFR: 57 mL/min/{1.73_m2} — ABNORMAL LOW (ref >59)

## 2022-11-18 LAB — HEMOGLOBIN A1C
Est. average glucose Bld gHb Est-mCnc: 157 mg/dL
Hgb A1c MFr Bld: 7.1 % — ABNORMAL HIGH (ref 4.8–5.6)

## 2022-11-18 LAB — MICROALBUMIN / CREATININE URINE RATIO
Creatinine, Urine: 16.4 mg/dL
Microalb/Creat Ratio: <18 mg/g creat (ref 0–29)
Microalbumin, Urine: <3 ug/mL

## 2022-11-18 NOTE — Assessment & Plan Note (Addendum)
New diagnosis today based on elevated readings at her past few visits. - Will check a BMP today - Initiate amlodipine-olmesartan 5-'20mg'$  daily - F/u in a few weeks for BMP recheck

## 2022-11-18 NOTE — Assessment & Plan Note (Addendum)
Due for A1c and UACR. - ordered - has not been taking Crestor--reiterated importance of this for her, even if she starts with every other day dosing

## 2022-11-18 NOTE — Assessment & Plan Note (Signed)
Pain in both hip and knee but I suspect hip is the primary driver of her symptoms given entirely benign knee exam. I strongly suspect this is secondary to altered mechanics from her new shoes. However, cannot rule out arthritic changes without imaging. Also considered recurrence of her trochanteric bursitis given lateral tenderness, but tenderness is less than I would expect with bursitis and this would not explain the concomitant knee pain. - Advised to get rid of offending shoes - Referred for PT - Will go ahead and X ray bilateral hips

## 2022-11-18 NOTE — Assessment & Plan Note (Signed)
Due for repeat DEXA, ordered today

## 2022-11-23 NOTE — Progress Notes (Unsigned)
  SUBJECTIVE:   CHIEF COMPLAINT / HPI:   Left hip/knee pain: Seen on 11/17/2022 for pain x 1 month suspect to be related to new shoes.  No history of injury.  XR bilateral hips did not reveal any arthritic changes nor fractures with the exception of mild osteoarthritis of the right SI joint. Notes knee pain has gotten worse since Monday. Bending the knee is painful and walking down steps. States she used to be very active prior to all this. Taking Advil '400mg'$  every 6 hours as needed.   PERTINENT  PMH / PSH: T2DM, GERD, HLD, HTN, lichen planus  OBJECTIVE:  BP (!) 148/86   Pulse 63   Ht '5\' 1"'$  (1.549 m)   Wt 145 lb (65.8 kg)   SpO2 94%   BMI 27.40 kg/m  Left knee: No gross deformity, ecchymoses, swelling. TTP over lateral portion of knee joint line. FROM with normal strength. Negative ant/post drawers. Negative valgus/varus testing. Negative lachman.  Positive mcmurrays, apleys.  ASSESSMENT/PLAN:  Acute pain of left knee Assessment & Plan: The presentation of left hip/knee pain although today's visit is entirely about worsened knee pain.  I do not suspect ligamentous in nature, physical exam points towards meniscal etiology.  Will evaluate with XR.  Given age I do suspect OA on XR but if that is all would recommend ultrasound at sports medicine and PT referral.  Will await results.  Provided education on pain medications.  Orders: -     DG Knee Complete 4 Views Left; Future  Hypertension associated with diabetes [facility]) Assessment & Plan: Elevated today to 148/86 on recheck.  Already has established follow-up and this is again in the setting of acute knee pain.  No changes to medication today.   Hypercholesteremia Assessment & Plan: Endorses compliance on statin since last visit.  Advised repeat lipid panel in 5 to 6 weeks assuming adherence to medication.   Return if symptoms worsen or fail to improve. Wells Guiles, DO 11/24/2022, 11:57 AM PGY-2, [place]

## 2022-11-24 ENCOUNTER — Other Ambulatory Visit: Payer: Self-pay

## 2022-11-24 ENCOUNTER — Ambulatory Visit (INDEPENDENT_AMBULATORY_CARE_PROVIDER_SITE_OTHER): Payer: 59 | Admitting: Student

## 2022-11-24 ENCOUNTER — Ambulatory Visit
Admission: RE | Admit: 2022-11-24 | Discharge: 2022-11-24 | Disposition: A | Discharge status: Home / Self Care | Payer: 59 | Source: Ambulatory Visit | Attending: Family Medicine | Admitting: Family Medicine

## 2022-11-24 ENCOUNTER — Encounter: Payer: Self-pay | Admitting: Student

## 2022-11-24 VITALS — BP 148/86 | HR 63 | Ht 61.0 in | Wt 145.0 lb

## 2022-11-24 DIAGNOSIS — E1159 Type 2 diabetes mellitus with other circulatory complications: Secondary | ICD-10-CM | POA: Diagnosis not present

## 2022-11-24 DIAGNOSIS — I152 Hypertension secondary to endocrine disorders: Secondary | ICD-10-CM | POA: Diagnosis not present

## 2022-11-24 DIAGNOSIS — M25562 Pain in left knee: Secondary | ICD-10-CM

## 2022-11-24 DIAGNOSIS — M25569 Pain in unspecified knee: Secondary | ICD-10-CM

## 2022-11-24 DIAGNOSIS — E7800 Pure hypercholesterolemia, unspecified: Secondary | ICD-10-CM

## 2022-11-24 HISTORY — DX: Pain in unspecified knee: M25.569

## 2022-11-24 NOTE — Assessment & Plan Note (Signed)
The presentation of left hip/knee pain although today's visit is entirely about worsened knee pain.  I do not suspect ligamentous in nature, physical exam points towards meniscal etiology.  Will evaluate with XR.  Given age I do suspect OA on XR but if that is all would recommend ultrasound at sports medicine and PT referral.  Will await results.  Provided education on pain medications.

## 2022-11-24 NOTE — Patient Instructions (Addendum)
It was great to see you today! Thank you for choosing [facility] for your primary care. Diana Roach was seen for left knee pain.  Today we addressed: I have ordered an x-ray.  If this is mostly normal, we should consider getting ultrasound at sports medicine.  For pain, you may take Tylenol 1 g every 8 hours and Advil 400 mg every 6 hours. Please continue taking your cholesterol medication.  This should be rechecked in 5 to 6 weeks after you have been consistent with taking it.  I have placed an order for the x-ray.  Please go to [place] [facility] or at [facility] to have this completed.  You do not need an appointment, but if you would like to call them beforehand, their number is [phone].  We will contact you with your results afterwards.   If you haven't already, sign up for My Chart to have easy access to your labs results, and communication with your primary care physician.  You should return to our clinic Return if symptoms worsen or fail to improve. Please arrive 15 minutes before your appointment to ensure smooth check in process.  We appreciate your efforts in making this happen.  Thank you for allowing me to participate in your care, Wells Guiles, DO 11/24/2022, 11:20 AM PGY-2, [place]

## 2022-11-24 NOTE — Assessment & Plan Note (Signed)
Endorses compliance on statin since last visit.  Advised repeat lipid panel in 5 to 6 weeks assuming adherence to medication.

## 2022-11-24 NOTE — Assessment & Plan Note (Signed)
Elevated today to 148/86 on recheck.  Already has established follow-up and this is again in the setting of acute knee pain.  No changes to medication today.

## 2022-12-01 ENCOUNTER — Ambulatory Visit (INDEPENDENT_AMBULATORY_CARE_PROVIDER_SITE_OTHER): Payer: 59 | Admitting: Family Medicine

## 2022-12-01 ENCOUNTER — Encounter: Payer: Self-pay | Admitting: Family Medicine

## 2022-12-01 ENCOUNTER — Ambulatory Visit: Payer: Self-pay

## 2022-12-01 VITALS — BP 126/76 | Ht 61.0 in | Wt 147.0 lb

## 2022-12-01 DIAGNOSIS — M25562 Pain in left knee: Secondary | ICD-10-CM

## 2022-12-01 MED ORDER — METHYLPREDNISOLONE ACETATE 40 MG/ML IJ SUSP
40.0000 mg | Freq: Once | INTRAMUSCULAR | Status: AC
  Administered 2022-12-01: 40 mg via INTRA_ARTICULAR

## 2022-12-01 NOTE — Assessment & Plan Note (Signed)
Based on her physical exam I expect she may have some contusion or small posterior tear to her meniscus causing her discomfort.  We discussed with her conservative measures as well as corticosteroid injection to help alleviate some of her pain.  She opted for steroid injection today.  Injection detailed below.  Patient was also given a handout with some quad exercises to perform after the next couple days.  She may return to the gym and build up to her normal walking routine after 24 to 48 hours.  Follow-up with [place] in 4 to 6 weeks if pain worsens or fails to improve.  Can consider at that time formal physical therapy versus further imaging, MRI.

## 2022-12-01 NOTE — Progress Notes (Signed)
New Patient Office Visit  Subjective   Patient ID: Diana Roach, female    DOB: [birth year]  Age: 72 y.o. MRN: [id number]  Left knee pain.  Diana Roach is here today with chief complaint of left knee pain.  She reports her pain began about a month ago but worsened over the weekend when she got out of her car to go to church and felt a pop in her knee.  She had some acute pain at that moment however after couple of minutes her pain started to subside.  She did have some difficulty with ambulation and bearing weight over the weekend.  That has improved since then.  She denies any large swelling or bruising over her knee, locking or instability.  Most of her pain is located across the front of her knee wrapping around to the posterior knee.  She has tried ice and oral anti-inflammatories.   ROS as listed above in HPI    Objective:     BP 126/76   Ht '5\' 1"'$  (1.549 m)   Wt 147 lb (66.7 kg)   BMI 27.78 kg/m   Physical Exam Vitals reviewed.  Constitutional:      General: She is not in acute distress.    Appearance: Normal appearance. She is not ill-appearing, toxic-appearing or diaphoretic.  Pulmonary:     Effort: Pulmonary effort is normal.  Neurological:     Mental Status: She is alert.   Left knee: No obvious deformity or asymmetry.  No ecchymosis or effusion.  Some tenderness to palpation along medial > lateral joint line.  Full range of motion with flexion and extension.  Stable to varus and valgus stress.  Negative anterior drawer.  Negative Lachman's.  Positive Apleys and McMurray's for pain. Antalgic gait.   Limited left knee ultrasound: Quad tendon was visualized, no obvious hypoechoic changes, fibers intact.  No effusion seen in the suprapatellar pouch Patella tendon was visualized, no obvious hypoechoic changes, fibers intact. Medial meniscus was visualized in anterior middle and posterior knee, no obvious tear or hypoechoic changes.  No hypoechoic fluid surrounding  the meniscus. Lateral meniscus was visualized in the anterior, middle and posterior knee, no obvious tear or hypoechoic changes.  No hypoechoic fluid surrounding the meniscus.  Impression: No obvious meniscal tear or knee effusion seen today.  Benign ultrasound.    Assessment & Plan:   Problem List Items Addressed This Visit       Other   Knee pain, acute - Primary    Based on her physical exam I expect she may have some contusion or small posterior tear to her meniscus causing her discomfort.  We discussed with her conservative measures as well as corticosteroid injection to help alleviate some of her pain.  She opted for steroid injection today.  Injection detailed below.  Patient was also given a handout with some quad exercises to perform after the next couple days.  She may return to the gym and build up to her normal walking routine after 24 to 48 hours.  Follow-up with [place] in 4 to 6 weeks if pain worsens or fails to improve.  Can consider at that time formal physical therapy versus further imaging, MRI.      Relevant Orders   [place] LIMITED JOINT SPACE STRUCTURES LOW LEFT    After informed written consent timeout was performed, patient was seated on exam table. Left knee was prepped with alcohol swab and utilizing anteromedial approach, patient's right knee was injected intraarticularly  with 3:1 lidocaine: depomedrol. Patient tolerated the procedure well without immediate complications.   Return if symptoms worsen or fail to improve.    Elmore Guise, DO

## 2022-12-01 NOTE — Patient Instructions (Signed)
You have likely had an injury to your meniscus.  This will get better on its own however steroid injection that he got today should help to ease this pain quicker.  Take it easy for the next 24 to 48 hours and do not soak or submerge this knee.  He may shower and let the water wash over it.  Work on some of the range of motion and strengthening exercises over the next couple weeks.  Anticipate this will be better in 4 to 6 weeks.  If you have worsening of your pain or no resolution recommend follow-up with [place] in 4 weeks.

## 2022-12-03 ENCOUNTER — Other Ambulatory Visit: Payer: Self-pay | Admitting: Student

## 2022-12-12 NOTE — Progress Notes (Unsigned)
I connected with  Lorn Junes on 12/13/2022 by a audio enabled telemedicine application and verified that I am speaking with the correct person using two identifiers.  Patient Location: Home  Provider Location: Home Office  I discussed the limitations of evaluation and management by telemedicine. The patient expressed understanding and agreed to proceed.  Subjective:   Shawneequa Thomasenia Delashmutt is a 72 y.o. female who presents for Medicare Annual (Subsequent) preventive examination.  Review of Systems    Per HPI unless specifically indicated below.  Cardiac Risk Factors include: advanced age (>67mn, >>55women);female gender, Hypertension, and Hypercholesteremia.           Objective:       12/01/2022    8:48 AM 11/24/2022   11:19 AM 11/24/2022   10:59 AM  Vitals with BMI  Height 5' 1"$   5' 1"$   Weight 147 lbs  145 lbs  BMI [id number] Systolic [id number] Diastolic 76 86 77  Pulse   63    Today's Vitals   12/13/22 1310  PainSc: 0-No pain   There is no height or weight on file to calculate BMI.     12/13/2022    1:14 PM 11/17/2022    9:53 AM 05/18/2022   10:59 AM 05/07/2022   11:39 AM 04/26/2022    2:31 PM 03/26/2022    2:52 PM 11/17/2021   10:23 AM  Advanced Directives  Does Patient Have a Medical Advance Directive? No No No No No No No  Would patient like information on creating a medical advance directive? No - Patient declined No - Patient declined   No - Patient declined No - Patient declined No - Patient declined    Current Medications (verified) Outpatient Encounter Medications as of 12/13/2022  Medication Sig   acetaminophen (TYLENOL) 325 MG tablet Take 650 mg by mouth every 6 (six) hours as needed for mild pain or headache.   albuterol (VENTOLIN HFA) 108 (90 Base) MCG/ACT inhaler INHALE 2 PUFFS INTO THE LUNGS EVERY 6 HOURS AS NEEDED FOR WHEEZING FOR SHORTNESS OF BREATH   amLODipine-olmesartan (AZOR) 5-20 MG tablet Take 1 tablet by mouth daily.    clobetasol ointment (TEMOVATE) 0AB-123456789% Apply 1 Application topically at bedtime.   ELDERBERRY PO Take by mouth as needed.   EQ LORATADINE 10 MG tablet Take 1 tablet by mouth once daily   FLOVENT HFA 110 MCG/ACT inhaler Inhale 2 puffs by mouth twice daily   Multiple Vitamin (MULTIVITAMIN PO) Take by mouth daily.   omeprazole (PRILOSEC) 20 MG capsule Take 1 capsule by mouth once daily   polyethylene glycol (MIRALAX / GLYCOLAX) 17 g packet Take 17 g by mouth daily.   rosuvastatin (CRESTOR) 5 MG tablet Take 1 tablet (5 mg total) by mouth daily.   senna (SENOKOT) 8.6 MG TABS tablet Take 1 tablet (8.6 mg total) by mouth daily. (Patient taking differently: Take 1 tablet by mouth as needed.)   VITAMIN D PO Take by mouth daily.   Wheat Dextrin (BENEFIBER DRINK MIX PO) Take by mouth.   [DISCONTINUED] ondansetron (ZOFRAN) 4 MG tablet Take 1 tablet (4 mg total) by mouth every 6 (six) hours. (Patient not taking: Reported on 12/13/2022)   No facility-administered encounter medications on file as of 12/13/2022.    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   Plantar wart of right foot 04/10/2021   Radiculopathy, cervical 09/04/2014   Rash 02/27/2020   Sinusitis, acute 11/20/2020   Past Surgical History:  Procedure Laterality Date   APPENDECTOMY  1969   BACK SURGERY  1986   lumbar spine   CARPAL TUNNEL  RELEASE Bilateral    CATARACT EXTRACTION, BILATERAL  2020   COLONOSCOPY  2015   HAND SURGERY Left 2021   Family History  Problem Relation Age of Onset   Hypothyroidism Sister    Cervical cancer Sister    Fibromyalgia Sister    Heart disease Mother    Hypertension Brother    Lichen planus Brother    Cancer Brother        lung   Heart attack Brother    Diabetes Daughter    Cancer Daughter        skin cancer   Colon cancer Neg Hx    Pancreatic cancer Neg Hx    Stomach cancer Neg Hx    Esophageal cancer Neg Hx    Breast cancer Neg Hx    Social History   Socioeconomic History   Marital status: Widowed    Spouse name: Not on file   Number of children: 1   Years of education: 9   Highest education level: 9th grade  Occupational History   Occupation: retired    Fish farm manager: [employer]    Comment: cleaner  Tobacco Use   Smoking status: Former    Types: Cigarettes    Quit date: 10/25/1994    Years since quitting: 28.1   Smokeless tobacco: Never   Tobacco comments:    no plans to start again  Vaping Use   Vaping Use: Never used  Substance and Sexual Activity   Alcohol use: No   Drug use: No   Sexual activity: Not Currently  Other Topics Concern   Not on file  Social History Narrative   Lives with daughter, son-in-law, grandson   House in one level house; has stairs to get into house with handrails, grab bars not present, smoke alarms present; throw rugs with backing   Has dog named [place], chilhuahua/fox terrier   Likes to eat variety, meats, vegetables, fruits   Caffeine- sodas (Pepsi), water, tea, occasional orange juice   Wears seatbelt, wears sunblock      Patient likes to swim, go to [employer], camping, shopping   Social Determinants of Health   Financial Resource Strain: Low Risk  (12/13/2022)   Overall Financial Resource Strain (CARDIA)    Difficulty of Paying Living Expenses: Not hard at all  Food Insecurity: No Food Insecurity (12/13/2022)   Hunger Vital  Sign    Worried About Running Out of Food in the Last Year: Never true    [place] in the Last Year: Never true  Transportation Needs: No Transportation Needs (12/13/2022)   PRAPARE - Hydrologist (Medical): No    Lack of Transportation (Non-Medical): No  Physical Activity: Insufficiently Active (12/13/2022)   Exercise Vital Sign  Days of Exercise per Week: 4 days    Minutes of Exercise per Session: 30 min  Stress: No Stress Concern Present (12/13/2022)   [place]    Feeling of Stress : Not at all  Social Connections: Moderately Integrated (12/13/2022)   Social Connection and Isolation Panel [NHANES]    Frequency of Communication with Friends and Family: More than three times a week    Frequency of Social Gatherings with Friends and Family: More than three times a week    Attends Religious Services: More than 4 times per year    Active Member of [employer] or Organizations: Yes    Attends Archivist Meetings: 1 to 4 times per year    Marital Status: Widowed    Tobacco Counseling Counseling given: No Tobacco comments: no plans to start again   Clinical Intake:  Pre-visit preparation completed: No  Pain : No/denies pain Pain Score: 0-No pain     Nutritional Status: BMI of 19-24  Normal Nutritional Risks: None Diabetes: Yes CBG done?: No Did pt. bring in CBG monitor from home?: No  How often do you need to have someone help you when you read instructions, pamphlets, or other written materials from your doctor or pharmacy?: 1 - Never  Diabetic?Nutrition Risk Assessment:  Has the patient had any N/V/D within the last 2 months?  No  Does the patient have any non-healing wounds?  No  Has the patient had any unintentional weight loss or weight gain?  No   Diabetes:  Is the patient diabetic?  Yes  If diabetic, was a CBG obtained today?  No  Did the patient bring in  their glucometer from home?  No  How often do you monitor your CBG's? never.   Financial Strains and Diabetes Management:  Are you having any financial strains with the device, your supplies or your medication? No .  Does the patient want to be seen by Chronic Care Management for management of their diabetes?  No  Would the patient like to be referred to a Nutritionist or for Diabetic Management?  No   Diabetic Exams:  Diabetic Eye Exam: Overdue for diabetic eye exam. Pt has been advised about the importance in completing this exam. Patient advised to call and schedule an eye exam. Appt scheduled with dr. Schuyler Amor tomorrow, Tuesday, Feb. 20 th Diabetic Foot Exam: Overdue, Pt has been advised about the importance in completing this exam. Pt is scheduled for diabetic foot exam on next diabetic exam .   Interpreter Needed?: No  Information entered by :: Donnie Mesa, [place]   Activities of Daily Living    12/13/2022    1:07 PM  In your present state of health, do you have any difficulty performing the following activities:  Hearing? 1  Vision? 1  Comment [facility]  Difficulty concentrating or making decisions? 0  Walking or climbing stairs? 0  Dressing or bathing? 0  Doing errands, shopping? 0    Patient Care Team: Eppie Gibson, MD as PCP - General (Family Medicine) Roseanne Kaufman, MD as Consulting Physician (Orthopedic Surgery) Carol Ada, MD as Consulting Physician (Gastroenterology) Calvert Cantor, MD as Consulting Physician (Ophthalmology) Warren Danes, PA-C as Physician Assistant (Dermatology)  Indicate any recent Medical Services you may have received from other than [facility] providers in the past year (date may be approximate).     Assessment:   This is a routine wellness examination for Tanetta.  Hearing/Vision  screen Denies any hearing issues. Denies any change to her vision. Wear glasses. Annual Eye Exam.   Dietary issues and exercise activities  discussed: Current Exercise Habits: Structured exercise class, Type of exercise: treadmill;strength training/weights, Time (Minutes): 40, Frequency (Times/Week): 4, Weekly Exercise (Minutes/Week): 160, Intensity: Moderate, Exercise limited by: None identified   Goals Addressed   None    Depression Screen    12/13/2022    1:06 PM 11/24/2022   11:19 AM 11/17/2022    9:52 AM 05/18/2022   10:59 AM 04/26/2022    2:30 PM 03/26/2022    5:04 PM 11/17/2021   10:24 AM  PHQ 2/9 Scores  PHQ - 2 Score 0 0 0 0 0 0 0  PHQ- 9 Score 0 1 1 2 $ 0 0 1    Fall Risk    12/13/2022    1:07 PM 11/24/2022   11:19 AM 11/17/2022    9:52 AM 05/18/2022   11:00 AM 04/26/2022    2:30 PM  [place] in the past year? 0 0 0 0 0  Number falls in past yr: 0 0 0 0   Injury with Fall? 0 0 0  0  Risk for fall due to : No Fall Risks      Follow up Falls evaluation completed   Falls evaluation completed Falls evaluation completed    [place]:  Any stairs in or around the home? No  If so, are there any without handrails? No  Home free of loose throw rugs in walkways, pet beds, electrical cords, etc? Yes  Adequate lighting in your home to reduce risk of falls? Yes   ASSISTIVE DEVICES UTILIZED TO PREVENT FALLS:  Life alert? No  Use of a cane, walker or w/c? No  Grab bars in the bathroom? No  Shower chair or bench in shower? Yes  Elevated toilet seat or a handicapped toilet? Yes   TIMED UP AND GO:  Was the test performed?Unable to perform, virtual appointment   Cognitive Function:    09/12/2018   11:31 AM  MMSE - Mini Mental State Exam  Orientation to time 5  Orientation to Place 5  Registration 3  Attention/ Calculation 5  Recall 3  Language- name 2 objects 2  Language- repeat 1  Language- follow 3 step command 3  Language- read & follow direction 1  Write a sentence 1  Copy design 1  Total score 30        12/13/2022    1:09 PM 09/12/2018   11:31 AM  6CIT  Screen  What Year? 0 points 0 points  What month? 0 points 0 points  What time? 0 points 0 points  Count back from 20 0 points 0 points  Months in reverse 0 points 0 points  Repeat phrase 0 points 0 points  Total Score 0 points 0 points    Immunizations Immunization History  Administered Date(s) Administered   Fluad Quad(high Dose 65+) 08/12/2020, 09/01/2022   Influenza Split 11/03/2012   Influenza,inj,Quad PF,6+ Mos 09/24/2013, 09/04/2014, 08/11/2016, 07/29/2017, 09/12/2018, 07/24/2019, 08/04/2021   PFIZER(Purple Top)SARS-COV-2 Vaccination 07/10/2020, 07/31/2020   Pneumococcal Conjugate-13 07/29/2017   Pneumococcal Polysaccharide-23 09/12/2018   Td 06/14/2008   Tdap 10/26/2019    TDAP status: Up to date  Flu Vaccine status: Up to date  Pneumococcal vaccine status: Up to date  Covid-19 vaccine status: Information provided on how to obtain vaccines.   Qualifies for Shingles Vaccine? Yes  Zostavax completed No   Shingrix Completed?: No.    Education has been provided regarding the importance of this vaccine. Patient has been advised to call insurance company to determine out of pocket expense if they have not yet received this vaccine. Advised may also receive vaccine at local pharmacy or Health Dept. Verbalized acceptance and understanding.  Screening Tests Health Maintenance  Topic Date Due   Zoster Vaccines- Shingrix (1 of 2) Never done   COVID-19 Vaccine (3 - Pfizer risk series) 08/28/2020   FOOT EXAM  08/04/2022   OPHTHALMOLOGY EXAM  08/31/2022   HEMOGLOBIN A1C  05/18/2023   COLONOSCOPY (Pts 45-43yr Insurance coverage will need to be confirmed)  11/13/2023   Diabetic kidney evaluation - eGFR measurement  11/18/2023   Diabetic kidney evaluation - Urine ACR  11/18/2023   Medicare Annual Wellness (AWV)  12/14/2023   MAMMOGRAM  06/24/2024   DTaP/Tdap/Td (3 - Td or Tdap) 10/25/2029   Pneumonia Vaccine 72 Years old  Completed   INFLUENZA VACCINE  Completed   DEXA  SCAN  Completed   Hepatitis C Screening  Completed   HPV VACCINES  Aged Out    Health Maintenance  Health Maintenance Due  Topic Date Due   Zoster Vaccines- Shingrix (1 of 2) Never done   COVID-19 Vaccine (3 - Pfizer risk series) 08/28/2020   FOOT EXAM  08/04/2022   OPHTHALMOLOGY EXAM  08/31/2022    Colorectal cancer screening: Type of screening: Colonoscopy. Completed 11/12/2013. Repeat every 10 years  Mammogram status: Completed 06/24/2022. Repeat every year  DEXA Scan: 11/19/2019  Lung Cancer Screening: (Low Dose CT Chest recommended if Age 72-80years, 30 pack-year currently smoking OR have quit w/in 15years.) does not qualify.   Lung Cancer Screening Referral: not applicable  Additional Screening:  Hepatitis C Screening: does qualify; Completed 05/18/2022  Vision Screening: Recommended annual ophthalmology exams for early detection of glaucoma and other disorders of the eye. Is the patient up to date with their annual eye exam?  Yes  Who is the provider or what is the name of the office in which the patient attends annual eye exams? [facility] If pt is not established with a provider, would they like to be referred to a provider to establish care? No .   Dental Screening: Recommended annual dental exams for proper oral hygiene  Community Resource Referral / Chronic Care Management: CRR required this visit?  No   CCM required this visit?  No      Plan:     I have personally reviewed and noted the following in the patient's chart:   Medical and social history Use of alcohol, tobacco or illicit drugs  Current medications and supplements including opioid prescriptions. Patient is not currently taking opioid prescriptions. Functional ability and status Nutritional status Physical activity Advanced directives List of other physicians Hospitalizations, surgeries, and ER visits in previous 12 months Vitals Screenings to include cognitive, depression,  and falls Referrals and appointments  In addition, I have reviewed and discussed with patient certain preventive protocols, quality metrics, and best practice recommendations. A written personalized care plan for preventive services as well as general preventive health recommendations were provided to patient.   Ms. LAgricola, Thank you for taking time to come for your Medicare Wellness Visit. I appreciate your ongoing commitment to your health goals. Please review the following plan we discussed and let me know if I can assist you in the future.   These are the goals we  discussed:  Goals      Patient Stated     Exercise more; be less sedentary. Go to gym more.         This is a list of the screening recommended for you and due dates:  Health Maintenance  Topic Date Due   Zoster (Shingles) Vaccine (1 of 2) Never done   COVID-19 Vaccine (3 - Pfizer risk series) 08/28/2020   Complete foot exam   08/04/2022   Eye exam for diabetics  08/31/2022   Hemoglobin A1C  05/18/2023   Colon Cancer Screening  11/13/2023   Yearly kidney function blood test for diabetes  11/18/2023   Yearly kidney health urinalysis for diabetes  11/18/2023   Medicare Annual Wellness Visit  12/14/2023   Mammogram  06/24/2024   DTaP/Tdap/Td vaccine (3 - Td or Tdap) 10/25/2029   Pneumonia Vaccine  Completed   Flu Shot  Completed   DEXA scan (bone density measurement)  Completed   Hepatitis C Screening: USPSTF Recommendation to screen - Ages 20-79 yo.  Completed   HPV Vaccine  Aged [address] [place]   12/13/2022  Nurse Notes: Approximately 30 minute Non-Face -To-Face Medicare Wellness Visit        I have reviewed this visit and agree with the documentation.  [place]

## 2022-12-12 NOTE — Patient Instructions (Signed)

## 2022-12-13 ENCOUNTER — Ambulatory Visit (INDEPENDENT_AMBULATORY_CARE_PROVIDER_SITE_OTHER): Payer: 59

## 2022-12-13 DIAGNOSIS — Z0000 Encounter for general adult medical examination without abnormal findings: Secondary | ICD-10-CM | POA: Diagnosis not present

## 2022-12-14 ENCOUNTER — Other Ambulatory Visit: Payer: Self-pay | Admitting: Student

## 2022-12-14 DIAGNOSIS — H43813 Vitreous degeneration, bilateral: Secondary | ICD-10-CM | POA: Diagnosis not present

## 2022-12-14 DIAGNOSIS — Z961 Presence of intraocular lens: Secondary | ICD-10-CM | POA: Diagnosis not present

## 2022-12-14 DIAGNOSIS — H26493 Other secondary cataract, bilateral: Secondary | ICD-10-CM | POA: Diagnosis not present

## 2022-12-14 DIAGNOSIS — H353132 Nonexudative age-related macular degeneration, bilateral, intermediate dry stage: Secondary | ICD-10-CM | POA: Diagnosis not present

## 2022-12-14 DIAGNOSIS — D3131 Benign neoplasm of right choroid: Secondary | ICD-10-CM | POA: Diagnosis not present

## 2022-12-14 DIAGNOSIS — E119 Type 2 diabetes mellitus without complications: Secondary | ICD-10-CM | POA: Diagnosis not present

## 2022-12-15 ENCOUNTER — Ambulatory Visit (INDEPENDENT_AMBULATORY_CARE_PROVIDER_SITE_OTHER): Payer: 59 | Admitting: Student

## 2022-12-15 ENCOUNTER — Encounter: Payer: Self-pay | Admitting: Student

## 2022-12-15 VITALS — BP 146/76 | HR 69 | Ht 61.0 in | Wt 145.8 lb

## 2022-12-15 DIAGNOSIS — H26491 Other secondary cataract, right eye: Secondary | ICD-10-CM | POA: Diagnosis not present

## 2022-12-15 DIAGNOSIS — I10 Essential (primary) hypertension: Secondary | ICD-10-CM

## 2022-12-15 DIAGNOSIS — Z9189 Other specified personal risk factors, not elsewhere classified: Secondary | ICD-10-CM

## 2022-12-15 DIAGNOSIS — M25562 Pain in left knee: Secondary | ICD-10-CM | POA: Diagnosis not present

## 2022-12-15 NOTE — Assessment & Plan Note (Signed)
Elevated BP in office x2 today. Has not been taking Azor 5-20m due to generally well-controlled BP at home. Review of home log shows systolics ranging from the 90s to the 140s.  Will need better data before making a decision. -Referred to pharmacy clinic for ambulatory BP monitor -Hold off on Azor for now

## 2022-12-15 NOTE — Assessment & Plan Note (Signed)
Have been trying to get her on a statin for quite some time now.  Issues with hesitancy in the past.  Now with this almost certainly unrelated development of epistaxis when starting her statin.  Through shared decision making agreed that she would be willing to start Crestor 5 mg just twice per week.  Hopefully this is well-tolerated and we can uptitrate from there. -If epistaxis recurs, can reconsider statin, however I very highly doubt that these are related

## 2022-12-15 NOTE — Progress Notes (Signed)
    SUBJECTIVE:   CHIEF COMPLAINT / HPI:   L Knee Pain Seen at sports med, thought to be from a meniscus injury. Feeling better, eager to get back into the gym. Has been out for several weeks but plans to get back to it on Friday.   Hypertension New diagnosis at last visit. Was supposed to take amlodipine-olmesartan 5-64m daily  but held off due to pressures being normotensive at home. See home log below.    Statin Use Had three nosebleeds after starting Crestor daily. Therefore stopped the Crestor, no nosebleeds since.    OBJECTIVE:   BP (!) 146/76   Pulse 69   Ht 5' 1"$  (1.549 m)   Wt 145 lb 12.8 oz (66.1 kg)   SpO2 96%   BMI 27.55 kg/m   Physical Exam Constitutional:      General: She is not in acute distress. HENT:     Nose:     Comments: R nare with easily visualized superficial vessels in Kiesselbach's Plexus, no active bleeding or dried blood at this time.     Mouth/Throat:     Mouth: Mucous membranes are moist.     Pharynx: Oropharynx is clear. No oropharyngeal exudate.  Eyes:     Extraocular Movements: Extraocular movements intact.  Pulmonary:     Effort: Pulmonary effort is normal.  Musculoskeletal:     Comments: L knee without edema or deformity. No joint line tenderness or crepitus.   Lymphadenopathy:     Cervical: No cervical adenopathy.  Skin:    General: Skin is warm and dry.     Capillary Refill: Capillary refill takes less than 2 seconds.  Neurological:     Mental Status: She is alert.      ASSESSMENT/PLAN:   Hypertension Elevated BP in office x2 today. Has not been taking Azor 5-222mdue to generally well-controlled BP at home. Review of home log shows systolics ranging from the 90s to the 140s.  Will need better data before making a decision. -Referred to pharmacy clinic for ambulatory BP monitor -Hold off on Azor for now  Knee pain, acute Reassuringly much improved.  Plans to get back in the gym which I am glad to hear.  Resistance  training is one of the very best things that she can do for management of her diabetes and her osteopenia.  Will continue to monitor.  Candidate for statin therapy due to risk of future cardiovascular event Have been trying to get her on a statin for quite some time now.  Issues with hesitancy in the past.  Now with this almost certainly unrelated development of epistaxis when starting her statin.  Through shared decision making agreed that she would be willing to start Crestor 5 mg just twice per week.  Hopefully this is well-tolerated and we can uptitrate from there. -If epistaxis recurs, can reconsider statin, however I very highly doubt that these are related     J BaPearla DubonnetMD [place]

## 2022-12-15 NOTE — Assessment & Plan Note (Signed)
Reassuringly much improved.  Plans to get back in the gym which I am glad to hear.  Resistance training is one of the very best things that she can do for management of her diabetes and her osteopenia.  Will continue to monitor.

## 2022-12-15 NOTE — Patient Instructions (Signed)
Ms. Shiara, Cloyd your knee is feeling better. Let's get you back in the gym!  I want you taking your statin, at least two days per week.  We'll hold off on the BP meds for now.  We will get a 24 hour monitor done on March 6-7.   If the nose bleeds keep happening, let me know,   J Pearla Dubonnet, MD

## 2022-12-22 ENCOUNTER — Ambulatory Visit
Admission: RE | Admit: 2022-12-22 | Discharge: 2022-12-22 | Disposition: A | Discharge status: Home / Self Care | Payer: 59 | Source: Ambulatory Visit | Attending: Family Medicine | Admitting: Family Medicine

## 2022-12-22 DIAGNOSIS — Z780 Asymptomatic menopausal state: Secondary | ICD-10-CM | POA: Diagnosis not present

## 2022-12-22 DIAGNOSIS — M8580 Other specified disorders of bone density and structure, unspecified site: Secondary | ICD-10-CM

## 2022-12-22 DIAGNOSIS — M810 Age-related osteoporosis without current pathological fracture: Secondary | ICD-10-CM | POA: Diagnosis not present

## 2022-12-22 DIAGNOSIS — M85852 Other specified disorders of bone density and structure, left thigh: Secondary | ICD-10-CM | POA: Diagnosis not present

## 2022-12-26 ENCOUNTER — Other Ambulatory Visit: Payer: Self-pay | Admitting: Student

## 2022-12-26 DIAGNOSIS — M810 Age-related osteoporosis without current pathological fracture: Secondary | ICD-10-CM

## 2022-12-26 MED ORDER — ALENDRONATE SODIUM 70 MG PO TABS: 70.0000 mg | ORAL_TABLET | ORAL | 3 refills | Status: DC

## 2022-12-26 MED ORDER — CALCIUM CARBONATE-VITAMIN D 600-10 MG-MCG PO TABS: 1.0000 | ORAL_TABLET | Freq: Every day | ORAL | 2 refills | Status: DC

## 2022-12-29 ENCOUNTER — Encounter: Payer: Self-pay | Admitting: Pharmacist

## 2022-12-29 ENCOUNTER — Ambulatory Visit (INDEPENDENT_AMBULATORY_CARE_PROVIDER_SITE_OTHER): Payer: 59 | Admitting: Pharmacist

## 2022-12-29 VITALS — BP 138/77 | HR 70 | Wt 147.6 lb

## 2022-12-29 DIAGNOSIS — I10 Essential (primary) hypertension: Secondary | ICD-10-CM | POA: Diagnosis not present

## 2022-12-29 DIAGNOSIS — E7800 Pure hypercholesterolemia, unspecified: Secondary | ICD-10-CM | POA: Diagnosis not present

## 2022-12-29 NOTE — Assessment & Plan Note (Signed)
History of hyperlipidemia previously on rosuvastatin 20 mg once daily. Reports stopping her medication after experiencing several nose bleeds. States she saw a doctor who told her she "has a vessel that needs to be cauterized". During visit, patient states she is willing to restart rosuvastatin gradually. -Restart rosuvastatin 20 mg once daily three times per week, for two weeks. Then increase to once daily five times per week, for two weeks. Then increase to once daily seven times a week and continue.

## 2022-12-29 NOTE — Progress Notes (Signed)
S:     Chief Complaint  Patient presents with   Medication Management    BP Monitoring   72 y.o. female who presents for hypertension evaluation, education, and management.  PMH is significant for asthma, GERD, Type II diabetes.  Patient was referred and last seen by Primary Care Provider, Dr. Marylou Flesher, on 12/15/22.   Diagnosed with Hypertension in the year of 2024.    Patient not currently taking any blood pressure medications at home.  Discussed procedure for wearing the monitor and gave patient written instructions. Monitor was placed on non-dominant arm with instructions to return in the morning.   Current BP Medications include:  none  Antihypertensives tried in the past include: amlodipine  Patient also reports having issues with nose bleeds approximately 2 weeks after starting rosuvastatin. Sates she is no longer taking it at home. Reports seeing a doctor for her nosebleeds and was told she "has a vessel that needs to be cauterized".  O:  Review of Systems  All other systems reviewed and are negative.   Physical Exam Constitutional:      Appearance: Normal appearance.  Pulmonary:     Effort: Pulmonary effort is normal.  Neurological:     Mental Status: She is alert.  Psychiatric:        Mood and Affect: Mood normal.        Behavior: Behavior normal.        Thought Content: Thought content normal.    Last 3 Office BP readings: BP Readings from Last 3 Encounters:  12/29/22 138/77  12/15/22 (!) 146/76  12/01/22 126/76    Clinical Atherosclerotic Cardiovascular Disease (ASCVD): No  The 10-year ASCVD risk score (Arnett DK, et al., 2019) is: 22.9%   Values used to calculate the score:     Age: 56 years     Sex: Female     Is Non-Hispanic African American: No     Diabetic: Yes     Tobacco smoker: No     Systolic Blood Pressure: 0000000 mmHg     Is BP treated: No     HDL Cholesterol: 50 mg/dL     Total Cholesterol: 221 mg/dL  Basic Metabolic Panel     Component Value Date/Time   NA 141 11/17/2022 1214   K 4.9 11/17/2022 1214   CL 104 11/17/2022 1214   CO2 19 (L) 11/17/2022 1214   GLUCOSE 107 (H) 11/17/2022 1214   GLUCOSE 134 (H) 05/07/2022 1151   BUN 11 11/17/2022 1214   CREATININE 1.04 (H) 11/17/2022 1214   CREATININE 0.88 08/11/2016 1208   CALCIUM 10.1 11/17/2022 1214   GFRNONAA >60 05/07/2022 1151   GFRNONAA 69 08/11/2016 1208   GFRAA 74 08/21/2019 1415   GFRAA 80 08/11/2016 1208    ABPM Study Data: Arm Placement left arm  A/P: History of hypertension diagnosed earlier this year, currently not taking any antihypertensive medications at home; with goal systolic blood pressure of <130. Pressure today in clinic was 138/77. Patient reports monitoring blood pressure at home and states her pressures are at goal for systolic of < [id number].  - Initiated 24-hour continuous blood pressure monitoring - Plan for patient to return in 1 day to review results of blood pressure monitor    History of hyperlipidemia previously on rosuvastatin 20 mg once daily. Reports stopping her medication after experiencing several nose bleeds. States she saw a doctor who told her she "has a vessel that needs to be cauterized". During visit, patient states she is  willing to restart rosuvastatin gradually. -Restart rosuvastatin 20 mg once daily three times per week, for two weeks. Then increase to once daily five times per week, for two weeks. Then increase to once daily seven times a week and continue.   Results reviewed and written information provided.    Written patient instructions provided. Patient verbalized understanding of treatment plan.  Total time in face to face counseling 25 minutes.    Follow-up:  Pharmacist in 1 day Patient seen with Louanne Belton PharmD PGY-1 Pharmacy Resident and Estelle June, PharmD Candidate.

## 2022-12-29 NOTE — Patient Instructions (Signed)
Blood Pressure Activity Diary Time Lying down/ Sleeping Walking/ Exercise Stressed/ Angry Headache/ Pain Dizzy  9 AM       10 AM       11 AM       12 PM       1 PM       2 PM       Time Lying down/ Sleeping Walking/ Exercise Stressed/ Angry Headache/ Pain Dizzy  3 PM       4 PM        5 PM       6 PM       7 PM       8 PM       Time Lying down/ Sleeping Walking/ Exercise Stressed/ Angry Headache/ Pain Dizzy  9 PM       10 PM       11 PM       12 AM       1 AM       2 AM       3 AM       Time Lying down/ Sleeping Walking/ Exercise Stressed/ Angry Headache/ Pain Dizzy  4 AM       5 AM       6 AM       7 AM       8 AM       9 AM       10 AM        Time you woke up: _________                  Time you went to sleep:__________  Come back tomorrow at 8:30AM to have the monitor removed Call the [facility] if you have any questions before then [phone])  Wearing the Blood Pressure Monitor The cuff will inflate every 20 minutes during the day and every 30 minutes while you sleep. Your blood pressure readings will NOT display after cuff inflation Fill out the blood pressure-activity diary during the day, especially during activities that may affect your reading -- such as exercise, stress, walking, taking your blood pressure medications  Important things to know: Avoid taking the monitor off for the next 24 hours, unless it causes you discomfort or pain. Do NOT get the monitor wet and do NOT dry to clean the monitor with any cleaning products. Do NOT put the monitor on anyone else's arm. When the cuff inflates, avoid excess movement. Let the cuffed arm hang loosely, slightly away from the body. Avoid flexing the muscles or moving the hand/fingers. When you go to sleep, make sure that the hose is not kinked. Remember to fill out the blood pressure activity diary. If you experience severe pain or unusual pain (not associated with getting your blood pressure  checked), remove the monitor.  Troubleshooting:  Code  Troubleshooting   1  Check cuff position, tighten cuff   2, 3  Remain still during reading   4, 87  Check air hose connections and make sure cuff is tight   85, 89  Check hose connections and make tubing is not crimped   86  Push START/STOP to restart reading   88, 91  Retry by pushing START/STOP   90  Replace batteries. If problem persists, remove monitor and bring back to   clinic at follow up   97, 98, 99  Service required - Remove monitor and bring back to clinic at  follow up    Medication changes: Restart rosuvastatin 20 mg once daily three times per week, for two weeks. Then increase to once daily five times per week, for two weeks. Then increase to once daily seven times a week and continue.

## 2022-12-29 NOTE — Assessment & Plan Note (Signed)
History of hypertension diagnosed earlier this year, currently not taking any antihypertensive medications at home; with goal systolic blood pressure of <130. Pressure today in clinic was 138/77. Patient reports monitoring blood pressure at home and states her pressures are at goal for systolic of < [id number].  - Initiated 24-hour continuous blood pressure monitoring - Plan for patient to return in 1 day to review results of blood pressure monitor

## 2022-12-30 ENCOUNTER — Ambulatory Visit (INDEPENDENT_AMBULATORY_CARE_PROVIDER_SITE_OTHER): Payer: 59 | Admitting: Pharmacist

## 2022-12-30 ENCOUNTER — Encounter: Payer: Self-pay | Admitting: Pharmacist

## 2022-12-30 VITALS — BP 147/80 | HR 65

## 2022-12-30 DIAGNOSIS — I10 Essential (primary) hypertension: Secondary | ICD-10-CM | POA: Diagnosis not present

## 2022-12-30 NOTE — Patient Instructions (Signed)
Amb BP monitor reveals overall well controlled readings with elevations at times of office visits (white coat hypertension).

## 2022-12-30 NOTE — Assessment & Plan Note (Signed)
History of hypertension diagnosed earlier this year, currently not taking any antihypertensive medications with goal systolic blood pressure of <130.  -elevations noted at time of visits on both day of placement and reading - "white coat hypertension" with patient reporting anxious feeling is highly likely reason.  - Patient found to have normal blood pressure on 24-hour ambulatory blood pressure evaluation which demonstrates an average AWAKE blood pressure of 127/76 mmHg.  -Nocturnal dipping pattern is challenging to interpret due to patient's inability to reach a deep sleep overnight as she took a nap in the afternoon and the blood pressure cuff bothered her frequently after she went to bed (abnormal dipping pattern is not demonstrated with current 24-hour report). -Based on 24-hour ambulatory blood pressure report, no antihypertensive medication at this time. -Patient will continue to monitor at home (home meter is likely ~ 5-10 points lower than in office following side-by-side comparison).

## 2022-12-30 NOTE — Progress Notes (Signed)
S:     Chief Complaint  Patient presents with   Medication Management    BP Monitoring follow-up   72 y.o. female who presents for hypertension evaluation, education, and management.  PMH is significant for asthma, GERD, Type II diabetes.  Patient was referred and last seen by Primary Care Provider, Dr. Marylou Flesher, on 12/15/22.    Diagnosed with Hypertension in the year of 2024.     Patient not currently taking any blood pressure medications at home.  Discussed procedure for wearing the monitor and gave patient written instructions. Monitor was placed on non-dominant arm with instructions to return in the morning.   Current BP Medications include:  none  Antihypertensives tried in the past include: amlodipine/olmesartan prescribed and filled but patient has not taken.    Day #2 - Patient returns to clinic and reports trouble sleeping overnight as the monitoring cuff kept her awake.Patient reports they were able to wear the Ambulatory Blood Pressure Cuff for the entire 24 evaluation period.   O:  Review of Systems  All other systems reviewed and are negative.  Physical Exam Constitutional:      Appearance: Normal appearance.  Pulmonary:     Effort: Pulmonary effort is normal.  Neurological:     Mental Status: She is alert.  Psychiatric:        Mood and Affect: Mood normal.        Behavior: Behavior normal.        Thought Content: Thought content normal.    Last 3 Office BP readings: BP Readings from Last 3 Encounters:  12/30/22 (!) 147/80  12/29/22 138/77  12/15/22 (!) 146/76   Clinical Atherosclerotic Cardiovascular Disease (ASCVD): No  The 10-year ASCVD risk score (Arnett DK, et al., 2019) is: 25.5%   Values used to calculate the score:     Age: 22 years     Sex: Female     Is Non-Hispanic African American: No     Diabetic: Yes     Tobacco smoker: No     Systolic Blood Pressure: Q000111Q mmHg     Is BP treated: No     HDL Cholesterol: 50 mg/dL     Total  Cholesterol: 221 mg/dL  Basic Metabolic Panel    Component Value Date/Time   NA 141 11/17/2022 1214   K 4.9 11/17/2022 1214   CL 104 11/17/2022 1214   CO2 19 (L) 11/17/2022 1214   GLUCOSE 107 (H) 11/17/2022 1214   GLUCOSE 134 (H) 05/07/2022 1151   BUN 11 11/17/2022 1214   CREATININE 1.04 (H) 11/17/2022 1214   CREATININE 0.88 08/11/2016 1208   CALCIUM 10.1 11/17/2022 1214   GFRNONAA >60 05/07/2022 1151   GFRNONAA 69 08/11/2016 1208   GFRAA 74 08/21/2019 1415   GFRAA 80 08/11/2016 1208    ABPM Study Data: Arm Placement right arm  Overall Mean 24hr BP:   125/73 mmHg  HR: 70  Daytime Mean BP:  127/76 mmHg  HR: 72  Nighttime Mean BP:  120/65 mmHg  HR: 66  Dipping Pattern: Yes.    Sys:   5.5%   Dia: 14.2%   [normal dipping ~10-20%]  For Office Goal Goal BP of <130/80:  ABPM thresholds: Overall BP < 125/75, daytime BP <130/80 mmHg, sleeptime BP <110/65 mmHg   A/P: History of hypertension diagnosed earlier this year, currently not taking any antihypertensive medications with goal systolic blood pressure of <130.  -elevations noted at time of visits on both day of placement and  reading - "white coat hypertension" with patient reporting anxious feeling is highly likely reason.  - Patient found to have normal blood pressure on 24-hour ambulatory blood pressure evaluation which demonstrates an average AWAKE blood pressure of 127/76 mmHg.  -Nocturnal dipping pattern is  challenging to interpret due to patient's inability to reach a deep sleep overnight as she took a nap in the afternoon and the blood pressure cuff bothered her frequently after she went to bed (abnormal dipping pattern is not demonstrated with current 24-hour report) . -Based on 24-hour ambulatory blood pressure report, no antihypertensive medication at this time. -Patient will continue to monitor at home (home meter is likely ~ 5-10 points lower than in office following side-by-side comparison).  Results reviewed and  written information provided.    Written patient instructions provided. Patient verbalized understanding of treatment plan.  Total time in face to face counseling 15 minutes.    Follow-up:  PCP clinic visit on 01/03/24 Patient seen with Louanne Belton, PharmD, PGY-1 Pharmacy Resident and Joseph Art, PharmD, PGY2 Pharmacy Resident.

## 2022-12-30 NOTE — Progress Notes (Signed)
Reviewed and agree with Dr Graylin Shiver plan.

## 2022-12-31 NOTE — Progress Notes (Signed)
Reviewed and agree with Dr Koval's plan.   

## 2023-01-01 ENCOUNTER — Other Ambulatory Visit: Payer: Self-pay | Admitting: Student

## 2023-01-03 ENCOUNTER — Telehealth: Payer: Self-pay

## 2023-01-03 DIAGNOSIS — J4540 Moderate persistent asthma, uncomplicated: Secondary | ICD-10-CM

## 2023-01-03 DIAGNOSIS — R040 Epistaxis: Secondary | ICD-10-CM

## 2023-01-03 MED ORDER — ARNUITY ELLIPTA 100 MCG/ACT IN AEPB: 100.0000 ug | INHALATION_SPRAY | Freq: Every day | RESPIRATORY_TRACT | 5 refills | Status: DC

## 2023-01-03 NOTE — Telephone Encounter (Signed)
Received call from [facility], pharmacist at [facility] regarding Flovent inhaler.   Flovent is being discontinued by manufacturer. Arnuity Ellipta or Qvar are insurance covered inhalers that are comparable.  If appropriate, please send one of these alternatives to [place].   Talbot Grumbling, RN

## 2023-01-10 ENCOUNTER — Other Ambulatory Visit: Payer: Self-pay

## 2023-01-10 DIAGNOSIS — L439 Lichen planus, unspecified: Secondary | ICD-10-CM

## 2023-01-31 ENCOUNTER — Other Ambulatory Visit: Payer: Self-pay | Admitting: Student

## 2023-02-15 ENCOUNTER — Other Ambulatory Visit: Payer: Self-pay

## 2023-02-15 DIAGNOSIS — L439 Lichen planus, unspecified: Secondary | ICD-10-CM

## 2023-02-15 MED ORDER — CLOBETASOL PROPIONATE 0.05 % EX OINT: 1.0000 | TOPICAL_OINTMENT | Freq: Every evening | CUTANEOUS | 4 refills | Status: DC

## 2023-02-23 ENCOUNTER — Other Ambulatory Visit: Payer: Self-pay | Admitting: Student

## 2023-02-23 DIAGNOSIS — E1159 Type 2 diabetes mellitus with other circulatory complications: Secondary | ICD-10-CM

## 2023-02-25 ENCOUNTER — Encounter: Payer: Self-pay | Admitting: Student

## 2023-02-25 ENCOUNTER — Ambulatory Visit (INDEPENDENT_AMBULATORY_CARE_PROVIDER_SITE_OTHER): Payer: 59 | Admitting: Student

## 2023-02-25 ENCOUNTER — Other Ambulatory Visit: Payer: Self-pay | Admitting: *Deleted

## 2023-02-25 VITALS — BP 126/82 | HR 64 | Ht 61.0 in | Wt 145.6 lb

## 2023-02-25 DIAGNOSIS — E1140 Type 2 diabetes mellitus with diabetic neuropathy, unspecified: Secondary | ICD-10-CM | POA: Diagnosis not present

## 2023-02-25 MED ORDER — GABAPENTIN 100 MG PO CAPS: ORAL_CAPSULE | ORAL | 3 refills | Status: DC

## 2023-02-25 NOTE — Patient Instructions (Signed)
TAKE YOUR CRESTOR  Let's try a low dose medicine for your neuropathy. It's gabapentin, it can make you sleepy, so start off just taking it at night. If it is helpful, you can try taking it in the morning as well. If it makes you too sleepy, let me know and we can try something else.   [name], MD

## 2023-02-25 NOTE — Progress Notes (Unsigned)
    SUBJECTIVE:   CHIEF COMPLAINT / HPI:   BLE Pain Has had Left lower extremity pain seen both me and sports medicine for this over the past several months.  I initially thought that it was from her hip and probably related to some new shoes that she had.  Did note some SI joint OA on x-ray of hip.  She had a knee injection sports medicine. Knee [place] and XR were unremarkable.  Returns today with pain of the BLE that is different in character. This is more of a numb/tingling sensation of the toes.  She believes that she has been told she has neuropathy in the past but has never been treated for this.  She occasionally will have a shooting sensation that goes from her foot up her leg.   OBJECTIVE:   BP 126/82   Pulse 64   Ht 5\' 1"  (1.549 m)   Wt 145 lb 9.6 oz (66 kg)   SpO2 99%   BMI 27.51 kg/m   Gen: In good spirits, well-appearing Cardio: 2+ pulses to DP/PT BL, varicose veins of BLE MSK: BLE without swelling or deformity, neurovascularly intact, no joint tenderness to toes/ankles/knees BL. Good strength. Gait normal. She does report decreased sensation to distal toes bilaterally, perhaps a bit worse L>R.    ASSESSMENT/PLAN:   Type 2 diabetes mellitus with diabetic neuropathy, without long-term current use of insulin (HCC) Believe current symptoms represent neuropathy. Will treat accordingly. A1c historically well-controlled. Most recently 7.1%. Unfortunately, statin adherence continues to be an issue.  - Will start gabapentin 100mg  QHS, instructions given to titrate to effect, up to 300mg  BID - Will bring back in 1-3 months and repeat A1c at that time  - Reiterated the vital importance of taking her statin  - Continue exercise interventions---works out at [employer] Dorothyann Gibbs, MD [facility]

## 2023-02-26 NOTE — Assessment & Plan Note (Signed)
Believe current symptoms represent neuropathy. Will treat accordingly. A1c historically well-controlled. Most recently 7.1%. Unfortunately, statin adherence continues to be an issue.  - Will start gabapentin 100mg  QHS, instructions given to titrate to effect, up to 300mg  BID - Will bring back in 1-3 months and repeat A1c at that time  - Reiterated the vital importance of taking her statin  - Continue exercise interventions---works out at [employer]

## 2023-02-27 MED ORDER — LORATADINE 10 MG PO TABS: 10.0000 mg | ORAL_TABLET | Freq: Every day | ORAL | 3 refills | Status: DC

## 2023-03-02 ENCOUNTER — Other Ambulatory Visit: Payer: Self-pay | Admitting: Student

## 2023-03-02 DIAGNOSIS — I152 Hypertension secondary to endocrine disorders: Secondary | ICD-10-CM

## 2023-03-04 ENCOUNTER — Other Ambulatory Visit: Payer: Self-pay | Admitting: *Deleted

## 2023-03-07 MED ORDER — OMEPRAZOLE 20 MG PO CPDR: 20.0000 mg | DELAYED_RELEASE_CAPSULE | Freq: Every day | ORAL | 3 refills | Status: DC

## 2023-03-10 ENCOUNTER — Other Ambulatory Visit: Payer: Self-pay

## 2023-03-10 DIAGNOSIS — L439 Lichen planus, unspecified: Secondary | ICD-10-CM

## 2023-03-11 ENCOUNTER — Other Ambulatory Visit: Payer: Self-pay | Admitting: Student

## 2023-03-11 MED ORDER — CLOBETASOL PROPIONATE 0.05 % EX OINT: 1.0000 | TOPICAL_OINTMENT | Freq: Every evening | CUTANEOUS | 4 refills | Status: AC

## 2023-03-16 ENCOUNTER — Encounter: Payer: Self-pay | Admitting: Podiatry

## 2023-03-16 ENCOUNTER — Ambulatory Visit (INDEPENDENT_AMBULATORY_CARE_PROVIDER_SITE_OTHER): Payer: 59 | Admitting: Podiatry

## 2023-03-16 DIAGNOSIS — G5753 Tarsal tunnel syndrome, bilateral lower limbs: Secondary | ICD-10-CM | POA: Diagnosis not present

## 2023-03-16 MED ORDER — MELOXICAM 15 MG PO TABS: 15.0000 mg | ORAL_TABLET | Freq: Every day | ORAL | 0 refills | Status: DC

## 2023-03-16 NOTE — Progress Notes (Signed)
  Subjective:  Patient ID: Diana Roach, female    DOB: Jan 26, 1951,   MRN: [id number]  Chief Complaint  Patient presents with   Numbness    RM 21 Bilateral numbness, burning and stiffness in left 2nd toe x 7 months. Pt is diabetic and does have a history of arthritis in her feet.     72 y.o. female presents for concern as above. Denies any treatments.  Relates burning and tingling in their feet. Patient is diabetic and last A1c was  Lab Results  Component Value Date   HGBA1C 7.1 (H) 11/17/2022   .   PCP:  Alicia Amel, MD    . Denies any other pedal complaints. Denies n/v/f/c.   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   Plantar wart of right foot 04/10/2021   Radiculopathy, cervical 09/04/2014   Rash 02/27/2020   Sinusitis, acute 11/20/2020    Objective:  Physical Exam: Vascular: DP/PT pulses 2/4 bilateral. CFT <3 seconds. Normal hair growth on digits. No edema.  Skin. No lacerations or abrasions bilateral feet.  Musculoskeletal: MMT 5/5 bilateral lower extremities in DF, PF, Inversion and Eversion. Deceased ROM in DF of ankle joint.  No tenderness to palpation noted around the forefoot some pain along the medial arch and distal to medial malleolus. Positive tinels signs bilateral noted.  Neurological: Sensation intact to light touch.   Assessment:   1. Tarsal tunnel syndrome, bilateral      Plan:  Patient was evaluated and treated and all questions answered. -Discussed treatement  options; discussed tarsal tunnel and deformity;conservative and  surgical  -Meloxicam provided .  -Discussed continuing gabapentin given by PCP.  -Recommend good supportive shoes -Recommend daily stretching and icing -Patient to return to office  in 6 weeks for recheck.    Louann Sjogren, DPM

## 2023-04-13 ENCOUNTER — Ambulatory Visit (HOSPITAL_COMMUNITY)
Admission: RE | Admit: 2023-04-13 | Discharge: 2023-04-13 | Disposition: A | Discharge status: Home / Self Care | Payer: 59 | Source: Ambulatory Visit | Attending: Family Medicine | Admitting: Family Medicine

## 2023-04-13 ENCOUNTER — Ambulatory Visit
Admission: RE | Admit: 2023-04-13 | Discharge: 2023-04-13 | Disposition: A | Discharge status: Home / Self Care | Payer: 59 | Source: Ambulatory Visit | Attending: Gastroenterology | Admitting: Gastroenterology

## 2023-04-13 ENCOUNTER — Other Ambulatory Visit: Payer: Self-pay | Admitting: Gastroenterology

## 2023-04-13 ENCOUNTER — Telehealth: Payer: Self-pay

## 2023-04-13 ENCOUNTER — Ambulatory Visit (INDEPENDENT_AMBULATORY_CARE_PROVIDER_SITE_OTHER): Payer: 59 | Admitting: Student

## 2023-04-13 ENCOUNTER — Encounter: Payer: Self-pay | Admitting: Student

## 2023-04-13 VITALS — BP 149/80 | HR 93 | Ht 61.0 in | Wt 147.0 lb

## 2023-04-13 DIAGNOSIS — R109 Unspecified abdominal pain: Secondary | ICD-10-CM | POA: Diagnosis not present

## 2023-04-13 DIAGNOSIS — N189 Chronic kidney disease, unspecified: Secondary | ICD-10-CM

## 2023-04-13 DIAGNOSIS — R002 Palpitations: Secondary | ICD-10-CM | POA: Insufficient documentation

## 2023-04-13 DIAGNOSIS — I10 Essential (primary) hypertension: Secondary | ICD-10-CM

## 2023-04-13 DIAGNOSIS — R140 Abdominal distension (gaseous): Secondary | ICD-10-CM | POA: Diagnosis not present

## 2023-04-13 DIAGNOSIS — R1084 Generalized abdominal pain: Secondary | ICD-10-CM | POA: Diagnosis not present

## 2023-04-13 DIAGNOSIS — K5900 Constipation, unspecified: Secondary | ICD-10-CM | POA: Diagnosis not present

## 2023-04-13 NOTE — Telephone Encounter (Signed)
Patient calls nurse line reporting heart palpitations.   She reports she feels her heart is "throbbing in my throat." She reports she feels very anxious. She reports fatigue and excessive thirst.   She reports her BP was 130/76 HR 96 and CBG 188.  She denies any vision changes, headaches or SOB.  Patient scheduled for this morning for evaluation.   ED precautions given for worsening symptoms.

## 2023-04-13 NOTE — Assessment & Plan Note (Signed)
Previously 72-hour cardiac monitor in 2022 that did not show any serious arrhythmias. EKG in clinic today normal sinus rhythm, with no LBBB or RBBB and no acute ST or T wave changes compared to prior. Given she is symptomatic in this has been chronic, will refer to cardiology. Will also obtain CBC, TSH today. Echocardiogram ordered to rule out underlying structural abnormalities

## 2023-04-13 NOTE — Assessment & Plan Note (Signed)
Previously documented whitecoat hypertension, \with 24-hour ambulatory blood pressure evaluation a few months ago. Her average awake blood pressure was 127/76 which is within goal. Will continue to monitor.  Patient did seem a bit anxious today, which is likely reason for her elevated blood pressures.

## 2023-04-13 NOTE — Patient Instructions (Addendum)
It was great seeing you today.  As we discussed, If you experience chest pressure/squeezing or radiation of pain in your neck, jaw or arm please seek urgent medical care.  Likewise, if you feel as though you are going to pass out, please go to the emergency room. We will get an ultrasound of your heart.  Please go at your scheduled date. I sent a referral to cardiology.  You should expect a phone call to schedule your appointment within the next 2 weeks.  If you do not, please let us know.  We collected testing today- I will call you if it is abnormal. If your results are normal, I will send you a MyChart message.   If you have any questions or concerns, please feel free to call the clinic.   Have a wonderful day,  Dr. Darral Dash [facility] [phone]

## 2023-04-13 NOTE — Progress Notes (Signed)
    SUBJECTIVE:   CHIEF COMPLAINT / HPI:   Diana Roach is a 72 year-old female here to discuss palpitations. They have been ongoing for the past 2 weeks, but worsening over the past 2 days.  She says it feels like her heart is "flopping around."  Denies any chest pressure or pain.  No neck or jaw pain or tightness. Has been feeling a bit dizzy but denies loss of consciousness or syncope.  Reports fatigue. Denies nausea or vomiting.  No blood in her stool.   PERTINENT  PMH / PSH: Type 2 diabetes mellitus, osteoporosis, seasonal allergies, GERD, constipation  OBJECTIVE:   BP (!) 149/80   Pulse 93   Ht 5\' 1"  (1.549 m)   Wt 147 lb (66.7 kg)   SpO2 99%   BMI 27.78 kg/m   General: Well-appearing, no distress CV: Regular rate and rhythm with no appreciable murmurs, rubs or gallops Respiratory: Normal work of breathing on room air Abdomen: Slightly distended, soft and nontender.  Normal active bowel sounds. Extremities: Varicose veins in bilateral lower extremities.  Trace bilateral edema in lower extremities. Psych: Mildly anxious, pleasant   ASSESSMENT/PLAN:   Palpitations Previously 72-hour cardiac monitor in 2022 that did not show any serious arrhythmias. EKG in clinic today normal sinus rhythm, with no LBBB or RBBB and no acute ST or T wave changes compared to prior. Given she is symptomatic in this has been chronic, will refer to cardiology. Will also obtain CBC, TSH today. Echocardiogram ordered to rule out underlying structural abnormalities  Hypertension Previously documented whitecoat hypertension, \with 24-hour ambulatory blood pressure evaluation a few months ago. Her average awake blood pressure was 127/76 which is within goal. Will continue to monitor.  Patient did seem a bit anxious today, which is likely reason for her elevated blood pressures.     Darral Dash, DO [facility]

## 2023-04-14 LAB — BASIC METABOLIC PANEL
BUN/Creatinine Ratio: 13 (ref 12–28)
BUN: 16 mg/dL (ref 8–27)
CO2: 23 mmol/L (ref 20–29)
Calcium: 9.9 mg/dL (ref 8.7–10.3)
Chloride: 100 mmol/L (ref 96–106)
Creatinine, Ser: 1.22 mg/dL — ABNORMAL HIGH (ref 0.57–1.00)
Glucose: 135 mg/dL — ABNORMAL HIGH (ref 70–99)
Potassium: 4.4 mmol/L (ref 3.5–5.2)
Sodium: 138 mmol/L (ref 134–144)
eGFR: 47 mL/min/{1.73_m2} — ABNORMAL LOW (ref >59)

## 2023-04-14 LAB — CBC
Hematocrit: 41.4 % (ref 34.0–46.6)
Hemoglobin: 13.4 g/dL (ref 11.1–15.9)
MCH: 27.5 pg (ref 26.6–33.0)
MCHC: 32.4 g/dL (ref 31.5–35.7)
MCV: 85 fL (ref 79–97)
Platelets: 336 10*3/uL (ref 150–450)
RBC: 4.87 x10E6/uL (ref 3.77–5.28)
RDW: 14.1 % (ref 11.7–15.4)
WBC: 15.8 10*3/uL — ABNORMAL HIGH (ref 3.4–10.8)

## 2023-04-14 LAB — TSH: TSH: 1.05 u[IU]/mL (ref 0.450–4.500)

## 2023-04-15 ENCOUNTER — Ambulatory Visit (INDEPENDENT_AMBULATORY_CARE_PROVIDER_SITE_OTHER): Payer: 59 | Admitting: Family Medicine

## 2023-04-15 ENCOUNTER — Encounter: Payer: Self-pay | Admitting: Family Medicine

## 2023-04-15 VITALS — BP 147/83 | HR 71 | Ht 61.0 in | Wt 145.4 lb

## 2023-04-15 DIAGNOSIS — R300 Dysuria: Secondary | ICD-10-CM | POA: Diagnosis not present

## 2023-04-15 DIAGNOSIS — N3001 Acute cystitis with hematuria: Secondary | ICD-10-CM

## 2023-04-15 LAB — POCT URINALYSIS DIP (MANUAL ENTRY)
Bilirubin, UA: NEGATIVE
Glucose, UA: NEGATIVE mg/dL
Ketones, POC UA: NEGATIVE mg/dL
Leukocytes, UA: NEGATIVE
Nitrite, UA: NEGATIVE
Protein Ur, POC: NEGATIVE mg/dL
Spec Grav, UA: 1.01 (ref 1.010–1.025)
Urobilinogen, UA: 0.2 E.U./dL
pH, UA: 7 (ref 5.0–8.0)

## 2023-04-15 MED ORDER — CEPHALEXIN 500 MG PO CAPS: 500.0000 mg | ORAL_CAPSULE | Freq: Two times a day (BID) | ORAL | 0 refills | Status: DC

## 2023-04-15 NOTE — Progress Notes (Addendum)
    SUBJECTIVE:   CHIEF COMPLAINT / HPI:   UTI symptoms Had a UTI "a long time ago." Has frequency and urgency now for 1 week that is worsening today. Had a little bit of burning. She has been drinking a lot of water for this. She did take antibiotics for previous UTI but is unsure of which antibiotics she used. Reports 99.8 temperature 2 days ago. Has been having some chills. Some nausea and constipation but these are more chronic. Not really having abdominal pain but some discomfort which she has felt with constipation before.  Palpitations Goes July 2nd for echo. Sees cardiologist on 8/18.  PERTINENT  PMH / PSH: Has had urinary leaking since her 34s. Has had kidney problem before where her kidney "swole" up.  OBJECTIVE:   BP (!) 147/83   Pulse 71   Ht 5\' 1"  (1.549 m)   Wt 145 lb 6.4 oz (66 kg)   SpO2 95%   BMI 27.47 kg/m   General: Alert and oriented, in NAD Skin: Warm, dry, and intact HEENT: NCAT, EOM grossly normal, midline nasal septum Cardiac: RRR, no m/r/g appreciated Respiratory: CTAB, breathing and speaking comfortably on RA Abdominal/Back: Soft, mildly tender to palpation of epigastric area, nondistended, normoactive bowel sounds, no CVA tenderness, no suprapubic pain Extremities: Moves all extremities grossly equally Neurological: No gross focal deficit Psychiatric: Appropriate mood and affect   ASSESSMENT/PLAN:   UTI Urinary symptoms that feel similar to UTI before most likely recurrent UTI. Less likely pyelonephritis. UA with trace lysed blood, no leuks or nitrites; will send culture. Will give 500 mg keflex BID for 5 days; appears she has had cephalosporins in past without incident. Return precautions discussed if not able to take PO with increasing back/abdominal pain, n/v, and fever.  Palpitations Continuing. PCP to follow up cardiology and echo as above.  Janeal Holmes, MD [facility]

## 2023-04-15 NOTE — Patient Instructions (Signed)
It was great to see you today! Here's what we talked about:  I am sending in Keflex 500 mg twice daily for 5 days for your UTI. Let me know if you cannot tolerate this medicine. If you become more nauseous or have severe back/belly pain and cannot keep any food or drink down, please let us know and go to the emergency room for further evaluation.  Please let me know if you have any other questions.  Dr. Phineas Real

## 2023-04-17 ENCOUNTER — Other Ambulatory Visit: Payer: Self-pay

## 2023-04-17 ENCOUNTER — Emergency Department (HOSPITAL_COMMUNITY): Payer: 59

## 2023-04-17 ENCOUNTER — Observation Stay (HOSPITAL_COMMUNITY)
Admission: EM | Admit: 2023-04-17 | Discharge: 2023-04-19 | Disposition: A | Discharge status: Home / Self Care | Payer: 59 | Attending: Family Medicine | Admitting: Family Medicine

## 2023-04-17 ENCOUNTER — Encounter (HOSPITAL_COMMUNITY): Payer: Self-pay

## 2023-04-17 DIAGNOSIS — E119 Type 2 diabetes mellitus without complications: Secondary | ICD-10-CM | POA: Diagnosis not present

## 2023-04-17 DIAGNOSIS — Z87891 Personal history of nicotine dependence: Secondary | ICD-10-CM | POA: Insufficient documentation

## 2023-04-17 DIAGNOSIS — R1013 Epigastric pain: Secondary | ICD-10-CM

## 2023-04-17 DIAGNOSIS — R10816 Epigastric abdominal tenderness: Secondary | ICD-10-CM | POA: Diagnosis not present

## 2023-04-17 DIAGNOSIS — J45909 Unspecified asthma, uncomplicated: Secondary | ICD-10-CM | POA: Insufficient documentation

## 2023-04-17 DIAGNOSIS — R1011 Right upper quadrant pain: Secondary | ICD-10-CM | POA: Diagnosis not present

## 2023-04-17 DIAGNOSIS — R519 Headache, unspecified: Secondary | ICD-10-CM | POA: Diagnosis not present

## 2023-04-17 DIAGNOSIS — I700 Atherosclerosis of aorta: Secondary | ICD-10-CM | POA: Diagnosis not present

## 2023-04-17 DIAGNOSIS — R079 Chest pain, unspecified: Secondary | ICD-10-CM | POA: Diagnosis present

## 2023-04-17 DIAGNOSIS — I10 Essential (primary) hypertension: Secondary | ICD-10-CM | POA: Diagnosis not present

## 2023-04-17 DIAGNOSIS — K5730 Diverticulosis of large intestine without perforation or abscess without bleeding: Secondary | ICD-10-CM | POA: Diagnosis not present

## 2023-04-17 DIAGNOSIS — R0789 Other chest pain: Principal | ICD-10-CM | POA: Insufficient documentation

## 2023-04-17 DIAGNOSIS — K760 Fatty (change of) liver, not elsewhere classified: Secondary | ICD-10-CM | POA: Diagnosis not present

## 2023-04-17 LAB — BASIC METABOLIC PANEL
Anion gap: 11 (ref 5–15)
BUN: 10 mg/dL (ref 8–23)
CO2: 23 mmol/L (ref 22–32)
Calcium: 9.5 mg/dL (ref 8.9–10.3)
Chloride: 105 mmol/L (ref 98–111)
Creatinine, Ser: 0.9 mg/dL (ref 0.44–1.00)
GFR, Estimated: >60 mL/min (ref >60)
Glucose, Bld: 116 mg/dL — ABNORMAL HIGH (ref 70–99)
Potassium: 3.7 mmol/L (ref 3.5–5.1)
Sodium: 139 mmol/L (ref 135–145)

## 2023-04-17 LAB — CBC
HCT: 39 % (ref 36.0–46.0)
Hemoglobin: 12.6 g/dL (ref 12.0–15.0)
MCH: 27.3 pg (ref 26.0–34.0)
MCHC: 32.3 g/dL (ref 30.0–36.0)
MCV: 84.6 fL (ref 80.0–100.0)
Platelets: 401 10*3/uL — ABNORMAL HIGH (ref 150–400)
RBC: 4.61 MIL/uL (ref 3.87–5.11)
RDW: 14.2 % (ref 11.5–15.5)
WBC: 12.7 10*3/uL — ABNORMAL HIGH (ref 4.0–10.5)
nRBC: 0 % (ref 0.0–0.2)

## 2023-04-17 LAB — HEPATIC FUNCTION PANEL
ALT: 22 U/L (ref 0–44)
AST: 19 U/L (ref 15–41)
Albumin: 3.7 g/dL (ref 3.5–5.0)
Alkaline Phosphatase: 59 U/L (ref 38–126)
Bilirubin, Direct: <0.1 mg/dL (ref 0.0–0.2)
Total Bilirubin: 0.6 mg/dL (ref 0.3–1.2)
Total Protein: 7.2 g/dL (ref 6.5–8.1)

## 2023-04-17 LAB — LIPASE, BLOOD: Lipase: 35 U/L (ref 11–51)

## 2023-04-17 LAB — D-DIMER, QUANTITATIVE: D-Dimer, Quant: 0.73 ug/mL-FEU — ABNORMAL HIGH (ref 0.00–0.50)

## 2023-04-17 LAB — TROPONIN I (HIGH SENSITIVITY)
Troponin I (High Sensitivity): 6 ng/L (ref <18)
Troponin I (High Sensitivity): 6 ng/L (ref <18)

## 2023-04-17 LAB — TSH: TSH: 2.576 u[IU]/mL (ref 0.350–4.500)

## 2023-04-17 MED ORDER — METHYLPREDNISOLONE SODIUM SUCC 40 MG IJ SOLR
40.0000 mg | Freq: Once | INTRAMUSCULAR | Status: AC
  Administered 2023-04-17: 40 mg via INTRAVENOUS
  Filled 2023-04-17: qty 1

## 2023-04-17 MED ORDER — ALUM & MAG HYDROXIDE-SIMETH 200-200-20 MG/5ML PO SUSP
30.0000 mL | Freq: Once | ORAL | Status: AC
  Administered 2023-04-17: 30 mL via ORAL
  Filled 2023-04-17: qty 30

## 2023-04-17 MED ORDER — DIPHENHYDRAMINE HCL 25 MG PO CAPS
50.0000 mg | ORAL_CAPSULE | Freq: Once | ORAL | Status: AC
  Administered 2023-04-18: 50 mg via ORAL
  Filled 2023-04-17: qty 2

## 2023-04-17 MED ORDER — FAMOTIDINE 20 MG PO TABS
20.0000 mg | ORAL_TABLET | Freq: Once | ORAL | Status: AC
  Administered 2023-04-17: 20 mg via ORAL
  Filled 2023-04-17: qty 1

## 2023-04-17 MED ORDER — DIPHENHYDRAMINE HCL 50 MG/ML IJ SOLN: 50.0000 mg | Freq: Once | INTRAMUSCULAR | Status: AC

## 2023-04-17 MED ORDER — LIDOCAINE VISCOUS HCL 2 % MT SOLN
15.0000 mL | Freq: Once | OROMUCOSAL | Status: AC
  Administered 2023-04-17: 15 mL via ORAL
  Filled 2023-04-17: qty 15

## 2023-04-17 MED ORDER — SODIUM CHLORIDE 0.9 % IV BOLUS
1000.0000 mL | Freq: Once | INTRAVENOUS | Status: AC
  Administered 2023-04-17: 1000 mL via INTRAVENOUS

## 2023-04-17 MED ORDER — NITROGLYCERIN 0.4 MG SL SUBL: 0.4000 mg | SUBLINGUAL_TABLET | SUBLINGUAL | Status: DC | PRN

## 2023-04-17 NOTE — ED Provider Notes (Signed)
[place] EMERGENCY DEPARTMENT AT [facility] Provider Note   HPI: Diana Roach is a 72 year old female with a past medical history as below presenting today with epigastric pain she reports started today.  She reports her epigastric pain has been present in the past.  It is associated with nausea and some reflux.  She has regurgitated food associated with it.  She denies difficulty swallowing.  She reports sometimes it feels as though food gets stuck in her distal esophagus but it is able to pass.  She reports during this episode her epigastric abdominal pain went up into her chest and radiated towards her back.  She denies a history of pancreatitis.  She denies a history of gallbladder abnormalities.  She still has her gallbladder but has had her appendix removed.  She has not had fevers or chills.  She has not had diarrhea.  She denies any lower abdominal pain.  She denies vaginal bleeding or vaginal discharge changes.  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   Plantar wart of right foot 04/10/2021   Radiculopathy, cervical 09/04/2014   Rash 02/27/2020   Sinusitis, acute 11/20/2020    Past Surgical History:  Procedure Laterality Date   APPENDECTOMY  1969   BACK SURGERY  1986   lumbar spine   CARPAL TUNNEL RELEASE Bilateral    CATARACT EXTRACTION, BILATERAL  2020   COLONOSCOPY  2015   HAND SURGERY Left 2021     Social History   Tobacco Use   Smoking status: Former    Types:  Cigarettes    Quit date: 10/25/1994    Years since quitting: 28.4   Smokeless tobacco: Never   Tobacco comments:    no plans to start again  Vaping Use   Vaping Use: Never used  Substance Use Topics   Alcohol use: No   Drug use: No      Review of Systems  A complete ROS was performed with pertinent positives/negatives noted in the HPI.   Vitals:   04/17/23 2215 04/17/23 2230  BP: (!) 152/81 (!) 163/86  Pulse: 62 66  Resp: 12 16  Temp:    SpO2: 99% 99%    Physical Exam Vitals and nursing note reviewed.  Constitutional:      General: She is not in acute distress.    Appearance: She is well-developed. She is not ill-appearing.  Cardiovascular:     Rate and Rhythm: Normal rate and regular rhythm.     Pulses: Normal pulses.     Heart sounds: Normal heart sounds. No murmur heard.    No friction rub. No gallop.  Pulmonary:     Effort: Pulmonary effort is normal. No respiratory distress.     Breath sounds: Normal breath sounds. No stridor. No wheezing, rhonchi or rales.  Abdominal:     General: Abdomen is flat. There is no distension.     Palpations: Abdomen is soft.     Tenderness: There is abdominal tenderness  in the epigastric area. There is no guarding or rebound.  Musculoskeletal:        General: No swelling.  Skin:    General: Skin is warm and dry.     Capillary Refill: Capillary refill takes less than 2 seconds.  Neurological:     Mental Status: She is alert.     Procedures  MDM:  Imaging/radiology results:  DG Chest 2 View  Result Date: 04/17/2023 CLINICAL DATA:  Chest pain. EXAM: CHEST - 2 VIEW COMPARISON:  May 07, 2022 FINDINGS: The heart size and mediastinal contours are within normal limits. Both lungs are clear. The visualized skeletal structures are unremarkable. IMPRESSION: No active cardiopulmonary disease. Electronically Signed   By: Aram Candela M.D.   On: 04/17/2023 20:56      EKG results: ECG on my interpretation is normal sinus rhythm  and rate, without anatomical ischemia representing STEMI, New-onset Arrhythmia, or ischemic equivalent.    Lab results:  Results for orders placed or performed during the hospital encounter of 04/17/23 (from the past 24 hour(s))  Basic metabolic panel     Status: Abnormal   Collection Time: 04/17/23  8:18 PM  Result Value Ref Range   Sodium 139 135 - 145 mmol/L   Potassium 3.7 3.5 - 5.1 mmol/L   Chloride 105 98 - 111 mmol/L   CO2 23 22 - 32 mmol/L   Glucose, Bld 116 (H) 70 - 99 mg/dL   BUN 10 8 - 23 mg/dL   Creatinine, Ser 8.11 0.44 - 1.00 mg/dL   Calcium 9.5 8.9 - 91.4 mg/dL   GFR, Estimated >78 >29 mL/min   Anion gap 11 5 - 15  CBC     Status: Abnormal   Collection Time: 04/17/23  8:18 PM  Result Value Ref Range   WBC 12.7 (H) 4.0 - 10.5 K/uL   RBC 4.61 3.87 - 5.11 MIL/uL   Hemoglobin 12.6 12.0 - 15.0 g/dL   HCT 56.2 13.0 - 86.5 %   MCV 84.6 80.0 - 100.0 fL   MCH 27.3 26.0 - 34.0 pg   MCHC 32.3 30.0 - 36.0 g/dL   RDW 78.4 69.6 - 29.5 %   Platelets 401 (H) 150 - 400 K/uL   nRBC 0.0 0.0 - 0.2 %  Troponin I (High Sensitivity)     Status: None   Collection Time: 04/17/23  8:18 PM  Result Value Ref Range   Troponin I (High Sensitivity) 6 <18 ng/L  Hepatic function panel     Status: None   Collection Time: 04/17/23  8:18 PM  Result Value Ref Range   Total Protein 7.2 6.5 - 8.1 g/dL   Albumin 3.7 3.5 - 5.0 g/dL   AST 19 15 - 41 U/L   ALT 22 0 - 44 U/L   Alkaline Phosphatase 59 38 - 126 U/L   Total Bilirubin 0.6 0.3 - 1.2 mg/dL   Bilirubin, Direct <2.8 0.0 - 0.2 mg/dL   Indirect Bilirubin NOT CALCULATED 0.3 - 0.9 mg/dL  D-dimer, quantitative     Status: Abnormal   Collection Time: 04/17/23  8:18 PM  Result Value Ref Range   D-Dimer, Quant 0.73 (H) 0.00 - 0.50 ug/mL-FEU  TSH     Status: None   Collection Time: 04/17/23  8:18 PM  Result Value Ref Range   TSH 2.576 0.350 - 4.500 uIU/mL  Lipase, blood     Status: None   Collection Time: 04/17/23  9:57 PM  Result Value  Ref Range  Lipase 35 11 - 51 U/L  Troponin I (High Sensitivity)     Status: None   Collection Time: 04/17/23  9:57 PM  Result Value Ref Range   Troponin I (High Sensitivity) 6 <18 ng/L     Key medications administered in the ER:  Medications  nitroGLYCERIN (NITROSTAT) SL tablet 0.4 mg (has no administration in time range)  diphenhydrAMINE (BENADRYL) capsule 50 mg (has no administration in time range)    Or  diphenhydrAMINE (BENADRYL) injection 50 mg (has no administration in time range)  alum & mag hydroxide-simeth (MAALOX/MYLANTA) 200-200-20 MG/5ML suspension 30 mL (30 mLs Oral Given 04/17/23 2231)    And  lidocaine (XYLOCAINE) 2 % viscous mouth solution 15 mL (15 mLs Oral Given 04/17/23 2231)  famotidine (PEPCID) tablet 20 mg (20 mg Oral Given 04/17/23 2230)  sodium chloride 0.9 % bolus 1,000 mL (1,000 mLs Intravenous New Bag/Given 04/17/23 2321)  methylPREDNISolone sodium succinate (SOLU-MEDROL) 40 mg/mL injection 40 mg (40 mg Intravenous Given 04/17/23 2324)   Medical decision making: -Vital signs stable. Patient afebrile, hemodynamically stable, and non-toxic appearing. -Patient's presentation is most consistent with acute presentation with potential threat to life or bodily function.. -Diana Roach is a 72 y.o. female presenting to the emergency department with epigastric abdominal pain/chest pain.  -Additional history obtained from family at bedside.  -EKG I obtained reveals no anatomical ischemia representing STEMI, New-onset Arrhythmia, or ischemic equivalent.  Initial troponin was 6.  Repeat troponin normal at 6.  Therefore do not suspect ACS at this time. No concerns for Pericardial Tamponade on EKG and in light of patients hemodynamic stability doubt this pathology. No pain related to supine or prone positions and given EKG doubt Pericarditis. CXR unremarkable for focal airspace disease, patient is afebrile, no cough, and WBC shows no leukocytosis, do not suspect  Pneumonia. CXR without evidence of Pneumothorax. CXR without concern for Esophageal Tear and there is no recent intractable emesis or esophageal instrumentation. No peritonitis or free air on CXR worrisome for Perforated Abdominal Viscous. Unlikely myocarditis, does not fit clinical picture, chest pain not exertional, no EKG findings to support.  Chest pain is rating to the back patient has an elevated D-dimer.  Currently pending CTA of the chest.  Also experiencing epigastric abdominal pain however has normal lipase therefore do not suspect pancreatitis.  Has a negative Murphy sign with no abnormalities on hepatic function panel, do not suspect acute cholecystitis.  Patient given GI cocktail with some improvement. Patient was appropriately risk stratified with HEAR score of 4.  Plan at signout to follow-up on CTA dissection study and discussed with cardiology regarding stress testing. Pt care was handed off to oncoming team at 0000.  Complete history and physical and current plan have been communicated.  Please refer to their note for the remainder of ED care and ultimate disposition.      Medical Decision Making Amount and/or Complexity of Data Reviewed Labs: ordered. Radiology: ordered.  Risk OTC drugs. Prescription drug management.     The plan for this patient was discussed with Dr. Wallace Cullens, who voiced agreement and who oversaw evaluation and treatment of this patient.  Marta Lamas, MD Emergency Medicine, PGY-3  Note: Dragon medical dictation software was used in the creation of this note.   Clinical Impression:  1. Chest pain, unspecified type   2. Epigastric abdominal pain          Chase Caller, MD 04/18/23 0030    Sloan Leiter, DO 04/19/23 [phone]

## 2023-04-17 NOTE — ED Triage Notes (Signed)
Pt reports 2 weeks of heart palpitations, chest pain/epigastric pain started today, burning feeling that radiates through to her back between her shoulder blades. Some SOB and nausea as well.

## 2023-04-17 NOTE — ED Notes (Signed)
Called Main Lab spoke to [facility] to add newly ordered labs to existing samples

## 2023-04-17 NOTE — ED Notes (Signed)
Family updated as to patient's status.

## 2023-04-18 ENCOUNTER — Emergency Department (HOSPITAL_COMMUNITY): Payer: 59

## 2023-04-18 ENCOUNTER — Observation Stay (HOSPITAL_COMMUNITY): Payer: 59

## 2023-04-18 ENCOUNTER — Encounter (HOSPITAL_COMMUNITY): Payer: Self-pay | Admitting: Student

## 2023-04-18 DIAGNOSIS — I2510 Atherosclerotic heart disease of native coronary artery without angina pectoris: Secondary | ICD-10-CM | POA: Diagnosis not present

## 2023-04-18 DIAGNOSIS — I700 Atherosclerosis of aorta: Secondary | ICD-10-CM | POA: Diagnosis not present

## 2023-04-18 DIAGNOSIS — R079 Chest pain, unspecified: Secondary | ICD-10-CM | POA: Diagnosis not present

## 2023-04-18 DIAGNOSIS — R1013 Epigastric pain: Secondary | ICD-10-CM | POA: Diagnosis not present

## 2023-04-18 DIAGNOSIS — I2584 Coronary atherosclerosis due to calcified coronary lesion: Secondary | ICD-10-CM | POA: Diagnosis not present

## 2023-04-18 DIAGNOSIS — I351 Nonrheumatic aortic (valve) insufficiency: Secondary | ICD-10-CM | POA: Diagnosis not present

## 2023-04-18 DIAGNOSIS — R1011 Right upper quadrant pain: Secondary | ICD-10-CM | POA: Diagnosis not present

## 2023-04-18 DIAGNOSIS — K5730 Diverticulosis of large intestine without perforation or abscess without bleeding: Secondary | ICD-10-CM | POA: Diagnosis not present

## 2023-04-18 DIAGNOSIS — K219 Gastro-esophageal reflux disease without esophagitis: Secondary | ICD-10-CM | POA: Diagnosis not present

## 2023-04-18 DIAGNOSIS — I10 Essential (primary) hypertension: Secondary | ICD-10-CM

## 2023-04-18 DIAGNOSIS — R002 Palpitations: Secondary | ICD-10-CM | POA: Diagnosis not present

## 2023-04-18 DIAGNOSIS — E785 Hyperlipidemia, unspecified: Secondary | ICD-10-CM | POA: Diagnosis not present

## 2023-04-18 DIAGNOSIS — R0789 Other chest pain: Secondary | ICD-10-CM | POA: Diagnosis not present

## 2023-04-18 DIAGNOSIS — K760 Fatty (change of) liver, not elsewhere classified: Secondary | ICD-10-CM | POA: Diagnosis not present

## 2023-04-18 DIAGNOSIS — R072 Precordial pain: Secondary | ICD-10-CM

## 2023-04-18 DIAGNOSIS — E1169 Type 2 diabetes mellitus with other specified complication: Secondary | ICD-10-CM | POA: Diagnosis not present

## 2023-04-18 DIAGNOSIS — R519 Headache, unspecified: Secondary | ICD-10-CM | POA: Diagnosis not present

## 2023-04-18 DIAGNOSIS — E782 Mixed hyperlipidemia: Secondary | ICD-10-CM | POA: Diagnosis not present

## 2023-04-18 LAB — URINE CULTURE

## 2023-04-18 MED ORDER — ALUM & MAG HYDROXIDE-SIMETH 200-200-20 MG/5ML PO SUSP: 30.0000 mL | Freq: Four times a day (QID) | ORAL | Status: DC | PRN

## 2023-04-18 MED ORDER — ACETAMINOPHEN 325 MG PO TABS: 650.0000 mg | ORAL_TABLET | Freq: Four times a day (QID) | ORAL | Status: DC | PRN

## 2023-04-18 MED ORDER — PANTOPRAZOLE SODIUM 40 MG PO TBEC
40.0000 mg | DELAYED_RELEASE_TABLET | Freq: Every day | ORAL | Status: DC
  Administered 2023-04-18 – 2023-04-19 (×2): 40 mg via ORAL
  Filled 2023-04-18 (×2): qty 1

## 2023-04-18 MED ORDER — ALBUTEROL SULFATE (2.5 MG/3ML) 0.083% IN NEBU: 3.0000 mL | INHALATION_SOLUTION | Freq: Four times a day (QID) | RESPIRATORY_TRACT | Status: DC | PRN

## 2023-04-18 MED ORDER — IOHEXOL 350 MG/ML SOLN
80.0000 mL | Freq: Once | INTRAVENOUS | Status: AC | PRN
  Administered 2023-04-18: 80 mL via INTRAVENOUS

## 2023-04-18 MED ORDER — ACETAMINOPHEN 500 MG PO TABS
1000.0000 mg | ORAL_TABLET | Freq: Four times a day (QID) | ORAL | Status: DC | PRN
  Administered 2023-04-18: 1000 mg via ORAL
  Filled 2023-04-18: qty 2

## 2023-04-18 MED ORDER — FLUTICASONE FUROATE 100 MCG/ACT IN AEPB: 100.0000 ug | INHALATION_SPRAY | Freq: Every day | RESPIRATORY_TRACT | Status: DC

## 2023-04-18 MED ORDER — DIPHENHYDRAMINE HCL 25 MG PO CAPS: 25.0000 mg | ORAL_CAPSULE | Freq: Four times a day (QID) | ORAL | Status: DC | PRN

## 2023-04-18 MED ORDER — ENOXAPARIN SODIUM 40 MG/0.4ML IJ SOSY
40.0000 mg | PREFILLED_SYRINGE | INTRAMUSCULAR | Status: DC
  Administered 2023-04-18: 40 mg via SUBCUTANEOUS
  Filled 2023-04-18: qty 0.4

## 2023-04-18 MED ORDER — POLYETHYLENE GLYCOL 3350 17 G PO PACK: 17.0000 g | PACK | Freq: Every day | ORAL | Status: DC | PRN

## 2023-04-18 MED ORDER — ACETAMINOPHEN 650 MG RE SUPP: 650.0000 mg | Freq: Four times a day (QID) | RECTAL | Status: DC | PRN

## 2023-04-18 MED ORDER — BUDESONIDE 0.25 MG/2ML IN SUSP
0.2500 mg | Freq: Two times a day (BID) | RESPIRATORY_TRACT | Status: DC
  Administered 2023-04-18 – 2023-04-19 (×2): 0.25 mg via RESPIRATORY_TRACT
  Filled 2023-04-18 (×2): qty 2

## 2023-04-18 NOTE — Assessment & Plan Note (Signed)
BP initially hypertensive, likely due to pain.  Now improved.  On no meds at home. -Continue to monitor -Treat pain as above

## 2023-04-18 NOTE — Consult Note (Addendum)
Cardiology Consultation   Patient ID: Diana Roach MRN: [id number]; DOB: 09-01-1951  Admit date: 04/17/2023 Date of Consult: 04/18/2023  PCP:  Alicia Amel, MD   [facility] Providers Cardiologist:  None        Patient Profile:   Diana Roach is a 72 y.o. female with a hx of HTN, HLD, DM 2, and GERD  who is being seen 04/18/2023 for the evaluation of chest pain at the request of Dr. Manson Passey.  History of Present Illness:   Diana Roach with the above history was evaluated for palpitations in 2022. She wore a cardiac montior and no concerning arrythmias were seen. Echo at that time showed LVEF 60-65% with trivial AR.   She was seen by family medicine on 04/13/23 for palpitations x 2 weeks. She was not having any chest pain or syncope. But was experiencing dizziness and fatigue. Outpatient cardiology was consulted, but she has not yet been to that appt.    04/17/23 she presented to the ED with complaints of epigastric/lower chest pain that radiates to her back for the last two days. She was given a GI cocktail in the ED and her symptoms improved. Labs in the ED were unremarkable except WBC 12.7, D-dimer 0.73. troponin neg at 6. EKG NSR with no acute changes.   On exam she is pain free. She is unable to recall any triggers for her palpitations or pain. She does not think that they happen together. She reports increased pain with palpation of her epigastric area. She reports several episodes of palpitations since coming to the hospital with no abnormalities on telemetry.     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   Plantar wart of right foot 04/10/2021   Radiculopathy, cervical 09/04/2014   Rash 02/27/2020   Sinusitis, acute 11/20/2020    Past Surgical History:  Procedure Laterality Date   APPENDECTOMY  1969   BACK SURGERY  1986   lumbar spine   CARPAL TUNNEL RELEASE Bilateral    CATARACT EXTRACTION, BILATERAL  2020   COLONOSCOPY  2015   HAND SURGERY Left 2021     Home Medications:  Prior to Admission medications   Medication Sig Start Date End Date Taking? Authorizing Provider  acetaminophen (TYLENOL) 325 MG tablet Take 650 mg by mouth every 6 (six) hours as needed for mild pain or headache.   Yes [provider]  albuterol (VENTOLIN HFA) 108 (90 Base) MCG/ACT inhaler USE 2 INHALATIONS BY MOUTH EVERY 6 HOURS AS NEEDED FOR WHEEZING  FOR SHORTNESS OF BREATH Patient taking differently: Inhale 2 puffs into the lungs every 6 (six) hours as needed for wheezing or shortness of breath. 03/11/23  Yes Alicia Amel, MD  alendronate (FOSAMAX) 70 MG tablet Take 1 tablet (70 mg total) by mouth every 7 (seven) days. Take with a full glass of water on an empty stomach. 12/26/22  Yes Alicia Amel, MD  Calcium Carbonate-Vitamin D 600-10 MG-MCG TABS  Take 1 tablet by mouth daily. 12/26/22  Yes Alicia Amel, MD  cephALEXin (KEFLEX) 500 MG capsule Take 1 capsule (500 mg total) by mouth 2 (two) times daily for 5 days. 04/15/23 04/20/23 Yes Mabe, Earvin Hansen, MD  clobetasol ointment (TEMOVATE) 0.05 % Apply 1 Application topically at bedtime. Patient taking differently: Apply 1 Application topically at bedtime as needed (for eczema). 03/11/23  Yes Alicia Amel, MD  Fluticasone Furoate (ARNUITY ELLIPTA) 100 MCG/ACT AEPB Inhale 100 mcg into the lungs daily. 01/03/23  Yes Alicia Amel, MD  omeprazole (PRILOSEC) 20 MG capsule Take 1 capsule (20 mg total) by mouth  daily. Patient taking differently: Take 20 mg by mouth daily as needed (for allergies). 03/07/23  Yes Alicia Amel, MD  Polyethyl Glycol-Propyl Glycol (SYSTANE OP) Apply 1 drop to eye 4 (four) times daily as needed (for dry eye).   Yes [provider]  polyethylene glycol (MIRALAX / GLYCOLAX) 17 g packet Take 17 g by mouth daily. Patient taking differently: Take 17 g by mouth daily as needed for mild constipation. 03/12/21  Yes Peggyann Shoals C, DO  gabapentin (NEURONTIN) 100 MG capsule Start by taking just 100mg  (one tablet) at night. If this seems to help, you could add another tablet in the morning. If doing well on the 100mg  dose, you may increase to 200mg  (two tablets). Adjust the dose to the point that you are getting pain relief without being too sleepy, up to 300mg  twice daily. Patient not taking: Reported on 04/18/2023 02/25/23   Alicia Amel, MD  loratadine (EQ LORATADINE) 10 MG tablet Take 1 tablet (10 mg total) by mouth daily. Patient not taking: Reported on 04/18/2023 02/27/23   Alicia Amel, MD  meloxicam (MOBIC) 15 MG tablet Take 1 tablet (15 mg total) by mouth daily. Patient not taking: Reported on 04/18/2023 03/16/23   Louann Sjogren, DPM    Inpatient Medications: Scheduled Meds:  budesonide (PULMICORT) nebulizer solution  0.25 mg Nebulization BID   enoxaparin (LOVENOX) injection  40 mg Subcutaneous Q24H   pantoprazole  40 mg Oral Daily   Continuous Infusions:  PRN Meds: acetaminophen, albuterol, alum & mag hydroxide-simeth, diphenhydrAMINE, nitroGLYCERIN, polyethylene glycol  Allergies:    Allergies  Allergen Reactions   Codeine Itching and Hives   Augmentin [Amoxicillin-Pot Clavulanate] Other (See Comments)    Felt "clammy and like I was going to pass out" with vomiting.    Contrast Media [Iodinated Contrast Media] Other (See Comments)    BP dropped   Levaquin [Levofloxacin] Other (See Comments)    Hallucinations    Minocycline Other (See Comments)     Dizziness , "strange feeling"    Social History:   Social History   Socioeconomic History   Marital status: Widowed    Spouse name: Not on file   Number of children: 1   Years of education: 9   Highest education level: 9th grade  Occupational History   Occupation: retired    Associate Professor: [employer]    Comment: cleaner  Tobacco Use   Smoking status: Former    Types: Cigarettes    Quit date: 10/25/1994    Years since quitting: 28.4   Smokeless tobacco: Never   Tobacco comments:    no plans to start again  Vaping Use   Vaping Use: Never used  Substance and Sexual Activity   Alcohol use: No   Drug use: No   Sexual activity: Not Currently  Other Topics Concern   Not on  file  Social History Narrative   Lives with daughter, son-in-law, grandson   House in one level house; has stairs to get into house with handrails, grab bars not present, smoke alarms present; throw rugs with backing   Has dog named Willow, chilhuahua/fox terrier   Likes to eat variety, meats, vegetables, fruits   Caffeine- sodas (Pepsi), water, tea, occasional orange juice   Wears seatbelt, wears sunblock      Patient likes to swim, go to [employer], camping, shopping   Social Determinants of Health   Financial Resource Strain: Low Risk  (12/13/2022)   Overall Financial Resource Strain (CARDIA)    Difficulty of Paying Living Expenses: Not hard at all  Food Insecurity: No Food Insecurity (12/13/2022)   Hunger Vital Sign    Worried About Running Out of Food in the Last Year: Never true    Ran Out of Food in the Last Year: Never true  Transportation Needs: No Transportation Needs (12/13/2022)   PRAPARE - Administrator, Civil Service (Medical): No    Lack of Transportation (Non-Medical): No  Physical Activity: Insufficiently Active (12/13/2022)   Exercise Vital Sign    Days of Exercise per Week: 4 days    Minutes of Exercise per Session: 30 min  Stress: No Stress Concern Present (12/13/2022)    [employer] of Occupational Health - Occupational Stress Questionnaire    Feeling of Stress : Not at all  Social Connections: Moderately Integrated (12/13/2022)   Social Connection and Isolation Panel [NHANES]    Frequency of Communication with Friends and Family: More than three times a week    Frequency of Social Gatherings with Friends and Family: More than three times a week    Attends Religious Services: More than 4 times per year    Active Member of [employer] or Organizations: Yes    Attends Banker Meetings: 1 to 4 times per year    Marital Status: Widowed  Intimate Partner Violence: Not At Risk (12/13/2022)   Humiliation, Afraid, Rape, and Kick questionnaire    Fear of Current or Ex-Partner: No    Emotionally Abused: No    Physically Abused: No    Sexually Abused: No    Family History:   Family History  Problem Relation Age of Onset   Hypothyroidism Sister    Cervical cancer Sister    Fibromyalgia Sister    Heart disease Mother    Hypertension Brother    Lichen planus Brother    Cancer Brother        lung   Heart attack Brother    Diabetes Daughter    Cancer Daughter        skin cancer   Colon cancer Neg Hx    Pancreatic cancer Neg Hx    Stomach cancer Neg Hx    Esophageal cancer Neg Hx    Breast cancer Neg Hx      ROS:  Please see the history of present illness.  All other ROS reviewed and negative.     Physical Exam/Data:   Vitals:   04/18/23 0600 04/18/23 0633 04/18/23 0730 04/18/23 0830  BP: 119/61  132/70 (!) 145/86  Pulse: (!) 57  64 92  Resp: 14  11 19   Temp:  97.7 F (36.5 C) 97.8 F (36.6 C)   TempSrc:      SpO2: 94%  95% 96%  Weight:      Height:        Intake/Output Summary (Last 24  hours) at 04/18/2023 1054 Last data filed at 04/18/2023 0134 Gross per 24 hour  Intake 1000 ml  Output --  Net 1000 ml      04/17/2023   11:26 PM 04/15/2023    4:22 PM 04/13/2023   10:06 AM  Last 3 Weights  Weight (lbs) 145 lb 145 lb 6.4 oz  147 lb  Weight (kg) 65.772 kg 65.953 kg 66.679 kg     Body mass index is 27.4 kg/m.  General:  Well nourished, well developed, in no acute distress HEENT: normal Cardiac:  normal S1, S2; RRR; 2/6 systolic murmur  Lungs:  clear to auscultation bilaterally, no wheezing, rhonchi or rales  Abd: soft, tenderness noted at epigastrium  Ext: no edema Musculoskeletal:  No deformities Skin: warm and dry  Neuro:  CNs 2-12 intact, no focal abnormalities noted Psych:  Normal affect   EKG:  The EKG was personally reviewed and demonstrates:  SR HR 64 Telemetry:  Telemetry was personally reviewed and demonstrates:  NSR HR 60s  Relevant CV Studies: Echo 03/21/2021 IMPRESSIONS    1. The left ventricle has normal systolic function with an ejection  fraction of 60-65%. The cavity size was normal. Left ventricular diastolic  Doppler parameters are consistent with pseudonormalization.   2. The right ventricle has normal systolic function. The cavity was  normal. There is no increase in right ventricular wall thickness.   3. Aortic valve regurgitation is trivial by color flow Doppler. No  stenosis of the aortic valve.   4. The aortic root and ascending aorta are normal in size and structure.   5. The average left ventricular global longitudinal strain is -15.7 %.   6. The interatrial septum was not assessed.   CTA chest/abd/pel 04/18/23 IMPRESSION: 1. No acute aortic syndrome. 2. No acute abnormality of the chest, abdomen or pelvis. 3. Rectosigmoid diverticulosis without acute inflammation.  Laboratory Data:  High Sensitivity Troponin:   Recent Labs  Lab 04/17/23 2018 04/17/23 2157  TROPONINIHS 6 6     Chemistry Recent Labs  Lab 04/13/23 1227 04/17/23 2018  NA 138 139  K 4.4 3.7  CL 100 105  CO2 23 23  GLUCOSE 135* 116*  BUN 16 10  CREATININE 1.22* 0.90  CALCIUM 9.9 9.5  GFRNONAA  --  >60  ANIONGAP  --  11    Recent Labs  Lab 04/17/23 2018  PROT 7.2  ALBUMIN 3.7  AST 19   ALT 22  ALKPHOS 59  BILITOT 0.6    Hematology Recent Labs  Lab 04/13/23 1227 04/17/23 2018  WBC 15.8* 12.7*  RBC 4.87 4.61  HGB 13.4 12.6  HCT 41.4 39.0  MCV 85 84.6  MCH 27.5 27.3  MCHC 32.4 32.3  RDW 14.1 14.2  PLT 336 401*   Thyroid  Recent Labs  Lab 04/17/23 2018  TSH 2.576    DDimer  Recent Labs  Lab 04/17/23 2018  DDIMER 0.73*    Radiology/Studies:  CT Angio Chest/Abd/Pel for Dissection W and/or Wo Contrast  Result Date: 04/18/2023 CLINICAL DATA:  Chest pain and epigastric abdominal pain EXAM: CT ANGIOGRAPHY CHEST, ABDOMEN AND PELVIS TECHNIQUE: Non-contrast CT of the chest was initially obtained. Multidetector CT imaging through the chest, abdomen and pelvis was performed using the standard protocol during bolus administration of intravenous contrast. Multiplanar reconstructed images and MIPs were obtained and reviewed to evaluate the vascular anatomy. RADIATION DOSE REDUCTION: This exam was performed according to the departmental dose-optimization program which includes automated exposure control, adjustment  of the mA and/or kV according to patient size and/or use of iterative reconstruction technique. CONTRAST:  80mL OMNIPAQUE IOHEXOL 350 MG/ML SOLN COMPARISON:  None Available. FINDINGS: CTA CHEST FINDINGS Cardiovascular: --Heart: The heart size is normal.  There is nopericardial effusion. --Aorta: The course and caliber of the thoracic aorta are normal. There is aortic atherosclerotic calcification. Precontrast images show no aortic intramural hematoma. There is no blood pool, dissection or penetrating ulcer demonstrated on arterial phase postcontrast imaging. There is a conventional 3 vessel aortic arch branching pattern. The proximal arch vessels are widely patent. --Pulmonary Arteries: Contrast timing is optimized for preferential opacification of the aorta. Within that limitation, normal central pulmonary arteries. Mediastinum/Nodes: No mediastinal, hilar or  axillary lymphadenopathy. The visualized thyroid and thoracic esophageal course are unremarkable. Lungs/Pleura: No pulmonary nodules or masses. No pleural effusion or pneumothorax. No focal airspace consolidation. No focal pleural abnormality. Musculoskeletal: No chest wall abnormality. No acute osseous findings. Review of the MIP images confirms the above findings. CTA ABDOMEN AND PELVIS FINDINGS VASCULAR Aorta: Normal caliber aorta without aneurysm, dissection, vasculitis or hemodynamically significant stenosis. There is aortic atherosclerosis. Celiac: No aneurysm, dissection or hemodynamically significant stenosis. Normal branching pattern. SMA: Widely patent without dissection or stenosis. Renals: Single renal arteries bilaterally. No aneurysm, dissection, stenosis or evidence of fibromuscular dysplasia. IMA: Patent without abnormality. Inflow: No aneurysm, stenosis or dissection. Veins: Normal course and caliber of the major veins. Assessment is otherwise limited by the arterial dominant contrast phase. Review of the MIP images confirms the above findings. NON-VASCULAR Hepatobiliary: Normal hepatic contours and density. No visible biliary dilatation. Normal gallbladder. Pancreas: Normal contours without ductal dilatation. No peripancreatic fluid collection. Spleen: Normal arterial phase splenic enhancement pattern. Adrenals/Urinary Tract: --Adrenal glands: Normal. --Right kidney/ureter: No hydronephrosis or perinephric stranding. No nephrolithiasis. No obstructing ureteral stones. --Left kidney/ureter: No hydronephrosis or perinephric stranding. No nephrolithiasis. No obstructing ureteral stones. --Urinary bladder: Unremarkable. Stomach/Bowel: --Stomach/Duodenum: No hiatal hernia or other gastric abnormality. Normal duodenal course and caliber. --Small bowel: No dilatation or inflammation. --Colon: Rectosigmoid diverticulosis without acute inflammation. --Appendix: Not visualized. No right lower quadrant  inflammation or free fluid. Lymphatic:  No abdominal or pelvic lymphadenopathy. Reproductive: Normal uterus and ovaries. Musculoskeletal. No bony spinal canal stenosis or focal osseous abnormality. Other: None. Review of the MIP images confirms the above findings. IMPRESSION: 1. No acute aortic syndrome. 2. No acute abnormality of the chest, abdomen or pelvis. 3. Rectosigmoid diverticulosis without acute inflammation. Aortic Atherosclerosis (ICD10-I70.0). Electronically Signed   By: Deatra Robinson M.D.   On: 04/18/2023 03:53   US Abdomen Limited  Result Date: 04/18/2023 CLINICAL DATA:  Right upper quadrant pain. EXAM: ULTRASOUND ABDOMEN LIMITED RIGHT UPPER QUADRANT COMPARISON:  05/07/2022. FINDINGS: Gallbladder: No gallstones or wall thickening visualized. No sonographic Murphy sign noted by sonographer. Common bile duct: Diameter: 6.7 mm, within normal limits for patient's age. Liver: No focal lesion identified. Increased parenchymal echogenicity. Portal vein is patent on color Doppler imaging with normal direction of blood flow towards the liver. Other: No free fluid. IMPRESSION: 1. No cholelithiasis or acute cholecystitis. 2. Hepatic steatosis. Electronically Signed   By: Thornell Sartorius M.D.   On: 04/18/2023 02:18   DG Chest 2 View  Result Date: 04/17/2023 CLINICAL DATA:  Chest pain. EXAM: CHEST - 2 VIEW COMPARISON:  May 07, 2022 FINDINGS: The heart size and mediastinal contours are within normal limits. Both lungs are clear. The visualized skeletal structures are unremarkable. IMPRESSION: No active cardiopulmonary disease. Electronically Signed   By: Waylan Rocher  Houston M.D.   On: 04/17/2023 20:56     Assessment and Plan:   Chest pain Epigastric pain -- presented with epigastric and lower chest pain, resolved after GI cocktail -- Has been seen by primary care recently for palpitations without chest pain or shortness of breath.  -- suspect pain is GI in nature -- echo pending -- consider cardiac  CT as outpatient as she does have risk factors for CAD, though symptoms do sound GI  Palpitations -- pt reports having episodes of palpitations while in the ED. Nothing seen on telemetry -- consider monitor  HTN --Hx of white coat hypertension -- BP reasonable in ED, not on anything at home   HLD -- Last lipids 29yrs ago were elevated, not on anything at home.  -- has been intolerant to statins in the past -- recheck in AM   DM2 -- A1c in January 7.1 -- recheck in AM  GERD -- continue protonix 40mg  daily   Risk Assessment/Risk Scores:        For questions or updates, please contact  [facility] Please consult [email or web address] for contact info under    Signed, Osborne Oman, RN, Student Nurse Practitioner  04/18/2023 10:54 AM   Personally seen and examined. Agree with APP student above with the following comments:  Briefly 72 yo F with a history of HTN, HLD with DM, GERD who has both epigastric pain and palpitations.  Patient notes that she has been having ne palpitations.  No trigger, resolve on their owen.  Accompanied by dizziness and fatgue.  She has an echo in 04/26/23 ordered with follow up planned with Dr. Sanjuana Kava (new visit)  Was doing well until having new epigastric pain that radiated to her back.  She has risk factor for CAD and does not take statins for her HLD because of issues with statin 20 mg NOS (myalgias).  In ED her sx resolves with GI cocktail.  EKG is normal.  No evidence of MI No CP No SOB No Palpitations No syncope.  Exam notable for  RRR, CTAB.  Systolic flow murmur on exam otherwise as above.  Tele: SR Personally reviewed relevant tests; a CTPE as performed.  Minimal calcified plaque at the LAD takeoff from the LM; esophageal thickening greater that 5 mm but without target sign.  For key conditions including  HLD with DM HTN Aortic regurgitation Palpitations CP-> GERD  Would recommend  - Primary treatment of GERD, CP appears non  cardiac in nature. - fasting lipids and Lp(a) tomorrow; will leave with either statin or low dose statin if patient is amenable - will follow patient's echo - if no palpitations or abnormal heart monitor findings; will consider outpatient non live ziopatch sent to her house to rule out arrhytmia - peri-discharge will follow up with me or my team; given contrast allergy no plans for CCTA  Riley Lam, MD FASE [facility] Cardiologist [facility]  [address], [address] [place], [place] [address] [phone]  2:06 PM

## 2023-04-18 NOTE — Progress Notes (Signed)
FMTS Interim Progress Note  S:NAEO. Patient states pain is resolved. Feels breathing is okay. Pain was primarily epigastric. Some radiation through to left shoulder.  O: BP 123/71   Pulse 73   Temp 97.7 F (36.5 C) (Oral)   Resp 17   Ht 5\' 1"  (1.549 m)   Wt 65.8 kg   SpO2 96%   BMI 27.40 kg/m   General: NAD  Neuro: A&O Cardiovascular: RRR, systolic murmur, no peripheral edema, no chest wall tenderness Respiratory: normal WOB on RA, CTAB, no wheezes, ronchi or rales Abdomen: soft, mild TTP at epigastric region, no rebound or guarding Extremities: Moving all 4 extremities equally   A/P: Chest pain - resolved Per night team, will have cardiology see today for evaluation for CT Coronary. Appears to be GERD/gas pain with resolution with GI cocktail. Low concern for ACS given negative work-up thus far. Exam and vitals reassuring. -Cardiology consulted, recs appreciated -Home PPI -GI cocktail PRN -F/u ECHO  Celine Mans, MD 04/18/2023, 1:50 PM PGY-1, [facility] Service pager [phone]

## 2023-04-18 NOTE — Progress Notes (Signed)
OT Cancellation Note  Patient Details Name: Diana Roach MRN: [id number] DOB: [birth year]   Cancelled Treatment:    Reason Eval/Treat Not Completed: OT screened, no needs identified, will sign off (per PT, pt does not have acute OT needs. Thank you.)  Donia Pounds 04/18/2023, 2:43 PM

## 2023-04-18 NOTE — ED Notes (Signed)
Admitting MD at bedside.

## 2023-04-18 NOTE — ED Notes (Signed)
Pt using bedside commode

## 2023-04-18 NOTE — Assessment & Plan Note (Addendum)
Vital signs continue to be stable. -Touch base with cardiology in a.m. -Repeat echo -Nitroglycerin as needed -Tylenol 1000 mg every 6 as needed -Repeat GI cocktail as needed -Resume home PPI, consider increasing dose though cautious with osteopenia -Heart healthy diet as tolerated -Vitals per unit routine with cardiorespiratory monitoring

## 2023-04-18 NOTE — Hospital Course (Addendum)
Diana Roach is a 71 y.o.female with a history of diabetes, GERD who was admitted to the [facility] Teaching Service at [facility] for chest pain. Her hospital course is detailed below:  Chest/Epigastric Pain In ED, patient had negative trops, EKG negative, CTA chest, lipase, and CXR unremarkable. Cardiology was consulted by EDP and have recommended CT coronaries and outpatient Zio patch. Pain resolved with GI cocktail. Continued on home PPI. Patient has scheduled follow up with cardiology outpatient.   Heart murmur Heard on exam, echo showed ***.   DM2 Received steroid in the ED. Controlled with SSI, discharged on home regimen ***.    Other chronic conditions were medically managed with home medications and formulary alternatives as necessary (Asthma, GERD)  PCP Follow-up Recommendations: Follow up with cardiology

## 2023-04-18 NOTE — ED Provider Notes (Signed)
Patient was received at shift change from Marta Lamas, MD please see note for full detail  Patient with medical history including diabetes, GERD, presenting with complaints of epigastric/chest pain, started yesterday which was intermittent but has become constant, describes as a pressure-like pain, will occasion go into her back, seems to be worse with movement and or eating or drinking, states that she does have some slight shortness of breath but this is intermittent mainly with deep inspiration, she denies actual pleuritic chest pain, she has no cardiac history, no history of PEs or DVTs: On oral birth control no recent surgeries no long immobilization, she denies alcohol use or NSAID use denies any illicit drug use.  States that she has had pain like this before but typically does not last as long.  Per previous provider follow-up on dissection study, if negative and continue to have pain patient may need to be admitted for chest pain rule out.   Physical Exam  BP 133/81   Pulse 64   Temp 97.7 F (36.5 C) (Oral)   Resp 12   Ht 5\' 1"  (1.549 m)   Wt 65.8 kg   SpO2 94%   BMI 27.40 kg/m   Physical Exam Vitals and nursing note reviewed.  Constitutional:      General: She is not in acute distress.    Appearance: She is not ill-appearing.  HENT:     Head: Normocephalic and atraumatic.     Nose: No congestion.  Eyes:     Conjunctiva/sclera: Conjunctivae normal.  Cardiovascular:     Rate and Rhythm: Normal rate and regular rhythm.     Pulses: Normal pulses.     Heart sounds: No murmur heard.    No friction rub. No gallop.  Pulmonary:     Effort: No respiratory distress.     Breath sounds: No wheezing, rhonchi or rales.  Abdominal:     Palpations: Abdomen is soft.     Tenderness: There is abdominal tenderness. There is no right CVA tenderness or left CVA tenderness.     Comments: Abdomen nondistended, soft, notable tenderness in the epigastric region without guarding rebound  is or peritoneal sign no Murphy sign, no flank tenderness.  Musculoskeletal:     Right lower leg: No edema.     Left lower leg: No edema.     Comments: No unilateral leg swelling no calf tenderness no palpable cords.  Skin:    General: Skin is warm and dry.  Neurological:     Mental Status: She is alert.  Psychiatric:        Mood and Affect: Mood normal.     Procedures  Procedures  ED Course / MDM    Medical Decision Making Amount and/or Complexity of Data Reviewed Labs: ordered. Radiology: ordered.  Risk OTC drugs. Prescription drug management. Decision regarding hospitalization.    Lab Tests:  I Ordered, and personally interpreted labs.  The pertinent results include: CBC shows leukocytosis of 12.7, BMP reveals glucose of 116, D-dimer elevated at 0.73, TSH unremarkable, negative delta troponin, lipase 35, hepatic panel negative   Imaging Studies ordered:  I ordered imaging studies including chest x-ray, limited ultrasound, CT dissection study I independently visualized and interpreted imaging which showed ultrasound and chest x-ray both negative acute findings, dissection study negative acute findings I agree with the radiologist interpretation   Cardiac Monitoring:  The patient was maintained on a cardiac monitor.  I personally viewed and interpreted the cardiac monitored which showed an underlying rhythm  of: Without signs of ischemia   Medicines ordered and prescription drug management:  I ordered medication including GI cock I have reviewed the patients home medicines and have made adjustments as needed  Critical Interventions:  N/A   Reevaluation:  Presents with chest/epigastric pain, my evaluation patient had minimal pain in the epigastric region, states she is feeling much better after GI cocktail, due to her epigastric pain process could be cholecystitis will further assess with ultrasound.  Ultrasound is negative, patient is resting comfortably,  will await CTA dissection for further evaluation.  Patient is reassessed, updated on dissection study, still endorsing pain, patient is heart score 4, will consult with cardiology for further recommendations.  Update patient needs recommendation she is here in this plan will admit to medicine    Consultations Obtained:  I requested consultation with the Dr. Hyacinth Meeker cardiology,  and discussed lab and imaging findings as well as pertinent plan - they recommend: Recommends medicine admission, likely will need CT coronary for further evaluation Spoke with Dr. Evette Georges will admit the patient.    Test Considered:  N/A    Rule out I have low suspicion for ACS, EKG was sinus rhythm without signs of ischemia, patient had negative delta troponin.  Doubt PE or dissection or aneurysm CT imaging is all negative these findings, presentation is atypical of etiology.  I doubt cholecystitis or cholangitis limited ultrasound was negative for these findings no elevation liver signs alk phos or T. bili.  I doubt bowel obstruction, volvulus, diverticulitis, appendicitis, CT imaging all negative these findings.  Low suspicion for systemic infection as patient is nontoxic-appearing, vital signs reassuring, no obvious source infection noted on exam.     Dispostion and problem list  After consideration of the diagnostic results and the patients response to treatment, I feel that the patent would benefit from admission.  Precordial chest pain-patient needs to be admitted for chest pain rule out, will likely benefit from further workup and possible formal consultation by cardiology.           Carroll Sage, PA-C 04/18/23 [id number]    Marily Memos, MD 04/18/23 [phone]

## 2023-04-18 NOTE — Assessment & Plan Note (Addendum)
Improved. -Continue to monitor -Treat pain as above

## 2023-04-18 NOTE — Evaluation (Signed)
Physical Therapy Evaluation Patient Details Name: Diana Roach MRN: [id number] DOB: 14-Mar-1951 Today's Date: 04/18/2023  History of Present Illness  Pt is 72 yo female admitted with chest pain/epigastric pain that resolved after GI cocktail.  Cardiology consulted and workup ongoing; however, largely felt to be GI related.  Pt with hx of HTN, HLD, DM2, and GERD  Clinical Impression  Pt admitted with above diagnosis. She has been transferring independently to bsc in her room.  She demonstrated safe transfers and hallway ambulation independently with good balance, no significant change in her pain (reports minimal mid chest/abd), and all VSS.  Pt is at her baseline and has no further PT needs.  Also, notified OT pt independent with ADLs and is at baseline.        Recommendations for follow up therapy are one component of a multi-disciplinary discharge planning process, led by the attending physician.  Recommendations may be updated based on patient status, additional functional criteria and insurance authorization.  Follow Up Recommendations       Assistance Recommended at Discharge None  Patient can return home with the following       Equipment Recommendations None recommended by PT  Recommendations for Other Services       Functional Status Assessment Patient has not had a recent decline in their functional status     Precautions / Restrictions Precautions Precautions: None      Mobility  Bed Mobility Overal bed mobility: Independent                  Transfers Overall transfer level: Independent   Transfers: Sit to/from Stand, Bed to chair/wheelchair/BSC Sit to Stand: Independent   Step pivot transfers: Independent       General transfer comment: Has been transferring to bsc in room independently and demonstrated safe transfers during eval    Ambulation/Gait Ambulation/Gait assistance: Independent Gait Distance (Feet): 400 Feet Assistive device:  None Gait Pattern/deviations: WFL(Within Functional Limits)       General Gait Details: Normal gait with good balance and speed; no increase in pain; had supervision for eval purposes but at independent level  Stairs            Wheelchair Mobility    Modified Rankin (Stroke Patients Only)       Balance Overall balance assessment: Independent   Sitting balance-Leahy Scale: Normal       Standing balance-Leahy Scale: Normal                 High Level Balance Comments: Able to look up/down/L/R, stop, turn, quick direction change, head turn, and march without LOB             Pertinent Vitals/Pain Pain Assessment Pain Assessment: 0-10 Pain Score: 1  Pain Location: mid -lower chest/upper abdomen Pain Descriptors / Indicators: Discomfort Pain Intervention(s): Monitored during session    Home Living Family/patient expects to be discharged to:: Private residence Living Arrangements: Children Available Help at Discharge: Family;Available 24 hours/day Type of Home: House Home Access: Stairs to enter Entrance Stairs-Rails: Doctor, general practice of Steps: 6   Home Layout: One level Home Equipment: Agricultural consultant (2 wheels);Shower seat      Prior Function Prior Level of Function : Independent/Modified Independent                     Hand Dominance   Dominant Hand: Right    Extremity/Trunk Assessment   Upper Extremity Assessment Upper Extremity Assessment: Overall WFL for  tasks assessed (ROM WFL; MMT 5/5)    Lower Extremity Assessment Lower Extremity Assessment: Overall WFL for tasks assessed (ROM WFL; MMT 5/5)    Cervical / Trunk Assessment Cervical / Trunk Assessment: Normal  Communication   Communication: No difficulties  Cognition Arousal/Alertness: Awake/alert Behavior During Therapy: WFL for tasks assessed/performed Overall Cognitive Status: Within Functional Limits for tasks assessed                                           General Comments General comments (skin integrity, edema, etc.): VSS during session    Exercises     Assessment/Plan    PT Assessment Patient does not need any further PT services  PT Problem List         PT Treatment Interventions      PT Goals (Current goals can be found in the Care Plan section)  Acute Rehab PT Goals Patient Stated Goal: return home PT Goal Formulation: All assessment and education complete, DC therapy    Frequency       Co-evaluation               AM-PAC PT "6 Clicks" Mobility  Outcome Measure Help needed turning from your back to your side while in a flat bed without using bedrails?: None Help needed moving from lying on your back to sitting on the side of a flat bed without using bedrails?: None Help needed moving to and from a bed to a chair (including a wheelchair)?: None Help needed standing up from a chair using your arms (e.g., wheelchair or bedside chair)?: None Help needed to walk in hospital room?: None Help needed climbing 3-5 steps with a railing? : None 6 Click Score: 24    End of Session   Activity Tolerance: Patient tolerated treatment well Patient left: in bed Nurse Communication: Mobility status PT Visit Diagnosis: Other abnormalities of gait and mobility (R26.89)    Time: 1610-9604 PT Time Calculation (min) (ACUTE ONLY): 11 min   Charges:   PT Evaluation $PT Eval Low Complexity: 1 Low          Lavern Maslow, PT Acute Rehab [facility] Rehab [phone]   Rayetta Humphrey 04/18/2023, 2:49 PM

## 2023-04-18 NOTE — H&P (Addendum)
Hospital Admission History and Physical Service Pager: [phone]  Patient name: Diana Roach Medical record number: [id number] Date of Birth: Sep 24, 1951 Age: 72 y.o. Gender: female  Primary Care Provider: Alicia Amel, MD Consultants: Cardiology in ED Code Status: FULL  Preferred Emergency Contact:  Contact Information     Name Relation Home Work [place] Daughter [phone]      Chief Complaint: Chest pain  Assessment and Plan: Diana Roach is a 72 y.o. female presenting with chest pain. Differential for presentation of this includes GERD/PUD which is more likely given predominantly epigastric abdominal pain/lower chest pain/back pain with improvement with GI cocktail. Also consider medication effects given she takes alendronate weekly and NSAIDs (though infrequent). ACS considered given rapid increase in pain with murmur (which patient knew about though no documentation that I can see); however, EKG and troponins are reassuring. Other differentials include PE, pneumonia, pancreatitis, cholecystitis, and SBO, though these are less likely based on history, physical exam, and studies obtained.  Hospital Problem List      Hospital     * (Principal) Chest pain     Vital signs stable.  Pain improved on history and exam with GI  cocktail.  She does have a history of chronic abdominal pain and has been  seen by GI in the past.  Of note is on alendronate which can cause  esophagitis. Less likely cardiopulmonary source. However, she has been  seen recently for palpitations and referred for echo and to cardiology for  further evaluation. Previous echo in 2020 with reassuring EF and trivial  AVR. ED team consulted with cardiology who would likely obtain CT coronary  angiogram with further workup in a.m. -Admit to FMTS attending Dr. Manson Passey -Touch base with cardiology in a.m. -Repeat echo -Nitroglycerin as needed -Tylenol  1000 mg every 6 as needed -Repeat GI cocktail as needed -Resume home PPI, consider increasing dose though cautious with osteopenia -Heart healthy diet as tolerated -Vitals per unit routine with cardiorespiratory monitoring        Hypertension     BP initially hypertensive, likely due to pain.  Now improved.  On no  meds at home. -Continue to monitor -Treat pain as above      FEN/GI: Heart healthy diet VTE Prophylaxis: Lovenox  Disposition: Med-tele, out of hospital pending PT/OT recs  History of Present Illness:  Diana Roach is a 72 y.o. female presenting with chest pain. Described the pain radiating from the lower chest/epigastric region, radiating to the back. Pain started 04/16/23, eased off and then began again the morning of 04/17/23. Patient was worried about the possibility of a heart attack. She notes that she took an abx for UTI and had Linzess as well before the pain started. Pain is worse with palpation and constant, felt some pain in the top left of the chest as well. Felt like she "couldn't digest her food" and only ate potato soup and crackers yesterday, unsure if it made the pain worse or better. No significant nausea or vomiting. Has been constipated for a long time, got a bit worse lately. She has tried miralax for this in the past. Her back pain radiates across the left side and to the upper back.   She notes that she had a similar pain last year, had imaging for this that was negative at that time.   The epigastric pain can come and go, but it continued this time, resulting in coming  to the ED.  Last took her aldendronate Monday 6/17, feels that the abdominal pain she is having is worse with this medication.    Has a history of chronic abdominal pain. Has seen GI in the past. She saw them over the past week, had an XR that was negative of the abdomen.    No fever but has had subjective chills.   Reports that she has had goody powders in the past, once  monthly.   Overall, the pain she has had has eased off since arrival to the ED. Has not eaten here yet.   In the ED, presented hypertensive with epigastric tenderness.  EKG collected without signs of STEMI.  Troponins flat 6.  Chest x-ray without focal airspace disease.  White blood cell count without leukocytosis.  D-dimer mildly elevated to 0.76 with negative CTA chest abdomen pelvis.  Lipase normal.  Had improvement with GI cocktail.  Heart score of 4, cardiology consulted, recommended admission for possible CT angiogram and further workup.  Review Of Systems: Per HPI  Pertinent Past Medical History: Asthma Diabetes, type II Diverticulitis GERD Generalized pain Lichenoid dermatitis Lichen planus Cervical radiculopathy HLD Osteopenia Remainder reviewed in history tab.   Pertinent Past Surgical History: Appendectomy 1969 Lumbar spinal surgery 1986 -- discectomy  Bilateral carpal tunnel release Bilateral cataract extraction 2020 Left hand surgery 2021 -- trigger finger release  Remainder reviewed in history tab.   Pertinent Social History: Tobacco use: Former smoker, quit 1996 Alcohol use: No Other Substance use: No Lives with daughter  Pertinent Family History: Mother-heart disease Sister-hypothyroidism, cervical cancer Brother-lichen planus Brother-lung cancer, heart attack Daughter-diabetes, skin cancer Remainder reviewed in history tab.   Important Outpatient Medications: Tylenol 650 every 6 as needed headache Alendronate 70 mg every 7 days Keflex 500 mg twice daily for 5 days (last dose 6/26) -- last took a dose 6/23 Arnuity Ellipta 1 puff daily Omeprazole 20 mg daily as needed MiraLAX 1 scoop as needed Remainder reviewed in medication history.   Objective: BP 133/81   Pulse 64   Temp 97.7 F (36.5 C) (Oral)   Resp 12   Ht 5\' 1"  (1.549 m)   Wt 65.8 kg   SpO2 94%   BMI 27.40 kg/m  Exam: General: NAD, nontoxic appearance, resting in bed and answering  all questions appropriately. Eyes: EOMI ENTM: Normal nares and external ears, no dental decay Neck: FROM, no masses Cardiovascular: RRR, no gallops or rubs, 3/6 systolic murmur best appreciated LLSB Respiratory: CTAB  Gastrointestinal: TTP over epigastrium, non-distended, soft  MSK: Moves all extremities Derm: No rashes noted  Neuro: Alert and oriented x3, no focal neurological deficits  Psych: Normal affect and mood   Labs:  CBC BMET  Recent Labs  Lab 04/17/23 2018  WBC 12.7*  HGB 12.6  HCT 39.0  PLT 401*   Recent Labs  Lab 04/17/23 2018  NA 139  K 3.7  CL 105  CO2 23  BUN 10  CREATININE 0.90  GLUCOSE 116*  CALCIUM 9.5     Troponin 6>6 Lipase 35 TSH 2.576 D-dimer 0.73  EKG: My own interpretation (not copied from electronic read): NSR, normal rate, no STEMI/BBB   Imaging Studies Performed:  CTA C/A/P IMPRESSION: 1. No acute aortic syndrome. 2. No acute abnormality of the chest, abdomen or pelvis. 3. Rectosigmoid diverticulosis without acute inflammation. Aortic Atherosclerosis (ICD10-I70.0).  RUQ US IMPRESSION: 1. No cholelithiasis or acute cholecystitis. 2. Hepatic steatosis.  Evette Georges, MD 04/18/2023, 5:40 AM PGY-1, [place]  Family Medicine FPTS Intern pager: [phone], text pages welcome Secure chat group [facility] Teaching Service  Upper Level Addendum:  I have seen and evaluated this patient along with Dr. Phineas Real and reviewed the above note, making necessary revisions as appropriate.  I agree with the medical decision making and physical exam as noted above.  Alfredo Martinez, MD PGY-2 [facility] Family Medicine Residency

## 2023-04-18 NOTE — Assessment & Plan Note (Addendum)
Vital signs stable.  Pain improved on history and exam with GI cocktail.  She does have a history of chronic abdominal pain and has been seen by GI in the past.  Of note is on alendronate which can cause esophagitis. Less likely cardiopulmonary source. However, she has been seen recently for palpitations and referred for echo and to cardiology for further evaluation. Previous echo in 2020 with reassuring EF and trivial AVR. ED team consulted with cardiology who would likely obtain CT coronary angiogram with further workup in a.m. -Admit to FMTS attending Dr. Manson Passey -Touch base with cardiology in a.m. -Repeat echo -Nitroglycerin as needed -Tylenol 1000 mg every 6 as needed -Repeat GI cocktail as needed -Resume home PPI, consider increasing dose though cautious with osteopenia -Heart healthy diet as tolerated -Vitals per unit routine with cardiorespiratory monitoring

## 2023-04-18 NOTE — ED Notes (Signed)
Provided patient with BSC.

## 2023-04-18 NOTE — ED Notes (Signed)
Physical therapy at bedside

## 2023-04-18 NOTE — ED Notes (Signed)
ED TO INPATIENT HANDOFF REPORT  ED Nurse Name and Phone #: Dot Lanes, medic [phone]  S Name/Age/Gender Diana Roach 72 y.o. female Room/Bed: 004C/004C  Code Status   Code Status: Full Code  Home/SNF/Other Home Patient oriented to: self, place, time, and situation Is this baseline? Yes   Triage Complete: Triage complete  Chief Complaint Chest pain [R07.9]  Triage Note Pt reports 2 weeks of heart palpitations, chest pain/epigastric pain started today, burning feeling that radiates through to her back between her shoulder blades. Some SOB and nausea as well.   Allergies Allergies  Allergen Reactions   Codeine Itching and Hives   Augmentin [Amoxicillin-Pot Clavulanate] Other (See Comments)    Felt "clammy and like I was going to pass out" with vomiting.    Contrast Media [Iodinated Contrast Media] Other (See Comments)    BP dropped   Levaquin [Levofloxacin] Other (See Comments)    Hallucinations    Minocycline Other (See Comments)    Dizziness , "strange feeling"    Level of Care/Admitting Diagnosis ED Disposition     ED Disposition  Admit   Condition  --   Comment  Hospital Area: [facility] [[id number]]  Level of Care: Telemetry Medical [104]  May place patient in observation at [facility] or [facility] if equivalent level of care is available:: Yes  Covid Evaluation: Asymptomatic - no recent exposure (last 10 days) testing not required  Diagnosis: Chest pain [259563]  Admitting Physician: Alfredo Martinez [[id number]]  Attending Physician: Westley Chandler [[id number]]          B Medical/Surgery History Past Medical History:  Diagnosis Date   Abdominal discomfort, epigastric 03/17/2021   Abdominal pain 04/24/2014   Asthma    Bilateral finger numbness 09/04/2014   Cataract    Chest discomfort 12/25/2020   COVID-19 05/23/2019   Diabetes mellitus without complication (HCC)    Diverticulitis    Environmental allergies    Generalized  pain 08/21/2019   Hair loss 07/19/2011   Knee pain, acute 11/24/2022   Left shoulder pain 07/05/2013   Lichen planus    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   Plantar wart of right foot 04/10/2021   Radiculopathy, cervical 09/04/2014   Rash 02/27/2020   Sinusitis, acute 11/20/2020   Past Surgical History:  Procedure Laterality Date   APPENDECTOMY  1969   BACK SURGERY  1986   lumbar spine   CARPAL TUNNEL RELEASE Bilateral    CATARACT EXTRACTION, BILATERAL  2020   COLONOSCOPY  2015   HAND SURGERY Left 2021     A IV Location/Drains/Wounds Patient Lines/Drains/Airways Status     Active Line/Drains/Airways     Name Placement date Placement time Site Days   Peripheral IV 04/17/23 20 G Left Antecubital 04/17/23  2156  Antecubital  1            Intake/Output Last 24 hours  Intake/Output Summary (Last 24 hours) at 04/18/2023 1836 Last data filed at 04/18/2023 0134 Gross per 24 hour  Intake 1000 ml  Output --  Net 1000 ml    Labs/Imaging Results for orders placed or performed during the hospital encounter of 04/17/23 (from the past 48 hour(s))  Basic metabolic panel     Status: Abnormal   Collection Time: 04/17/23  8:18 PM  Result Value Ref Range   Sodium 139 135 - 145 mmol/L   Potassium 3.7 3.5 - 5.1 mmol/L   Chloride 105 98 - 111 mmol/L  CO2 23 22 - 32 mmol/L   Glucose, Bld 116 (H) 70 - 99 mg/dL    Comment: Glucose reference range applies only to samples taken after fasting for at least 8 hours.   BUN 10 8 - 23 mg/dL   Creatinine, Ser 4.27 0.44 - 1.00 mg/dL   Calcium 9.5 8.9 - 06.2 mg/dL   GFR, Estimated >37 >62 mL/min    Comment: (NOTE) Calculated using the CKD-EPI Creatinine Equation (2021)    Anion gap 11 5 - 15    Comment: Performed at [facility], [address] [place], [place] [address]  CBC     Status: Abnormal   Collection Time: 04/17/23  8:18 PM  Result Value Ref Range   WBC 12.7 (H) 4.0 - 10.5 K/uL   RBC 4.61  3.87 - 5.11 MIL/uL   Hemoglobin 12.6 12.0 - 15.0 g/dL   HCT 76.1 60.7 - 37.1 %   MCV 84.6 80.0 - 100.0 fL   MCH 27.3 26.0 - 34.0 pg   MCHC 32.3 30.0 - 36.0 g/dL   RDW 06.2 69.4 - 85.4 %   Platelets 401 (H) 150 - 400 K/uL   nRBC 0.0 0.0 - 0.2 %    Comment: Performed at [facility], [address], [place], [place] [address]  Troponin I (High Sensitivity)     Status: None   Collection Time: 04/17/23  8:18 PM  Result Value Ref Range   Troponin I (High Sensitivity) 6 <18 ng/L    Comment: (NOTE) Elevated high sensitivity troponin I (hsTnI) values and significant  changes across serial measurements may suggest ACS but many other  chronic and acute conditions are known to elevate hsTnI results.  Refer to the "Links" section for chest pain algorithms and additional  guidance. Performed at [facility], [address] [place], [place] [address]   Hepatic function panel     Status: None   Collection Time: 04/17/23  8:18 PM  Result Value Ref Range   Total Protein 7.2 6.5 - 8.1 g/dL   Albumin 3.7 3.5 - 5.0 g/dL   AST 19 15 - 41 U/L   ALT 22 0 - 44 U/L   Alkaline Phosphatase 59 38 - 126 U/L   Total Bilirubin 0.6 0.3 - 1.2 mg/dL   Bilirubin, Direct <8.1 0.0 - 0.2 mg/dL   Indirect Bilirubin NOT CALCULATED 0.3 - 0.9 mg/dL    Comment: Performed at [facility], [address], [place], [place] [address]  D-dimer, quantitative     Status: Abnormal   Collection Time: 04/17/23  8:18 PM  Result Value Ref Range   D-Dimer, Quant 0.73 (H) 0.00 - 0.50 ug/mL-FEU    Comment: (NOTE) At the manufacturer cut-off value of 0.5 g/mL FEU, this assay has a negative predictive value of 95-100%.This assay is intended for use in conjunction with a clinical pretest probability (PTP) assessment model to exclude pulmonary embolism (PE) and deep venous thrombosis (DVT) in outpatients suspected of PE or DVT. Results should be correlated with clinical presentation. Performed at [facility], [address], [place], [place] [address]   TSH     Status: None   Collection Time: 04/17/23  8:18 PM  Result Value Ref Range   TSH 2.576 0.350 - 4.500 uIU/mL    Comment: Performed by a 3rd Generation assay with a functional sensitivity of <=0.01 uIU/mL. Performed at [facility], [address], [place], [place]  [address]   Lipase, blood     Status: None   Collection Time: 04/17/23  9:57 PM  Result Value Ref Range   Lipase 35 11 - 51 U/L    Comment: Performed at [facility], [address], [place], [place] [address]  Troponin I (High Sensitivity)     Status: None   Collection Time: 04/17/23  9:57 PM  Result Value Ref Range   Troponin I (High Sensitivity) 6 <18 ng/L    Comment: (NOTE) Elevated high sensitivity troponin I (hsTnI) values and significant  changes across serial measurements may suggest ACS but many other  chronic and acute conditions are known to elevate hsTnI results.  Refer to the "Links" section for chest pain algorithms and additional  guidance. Performed at [facility], [address] [place], [place] [address]    CT HEAD WO CONTRAST ( )  Result Date: 04/18/2023 CLINICAL DATA:  Headache EXAM: CT HEAD WITHOUT CONTRAST TECHNIQUE: Contiguous axial images were obtained from the base of the skull through the vertex without intravenous contrast. RADIATION DOSE REDUCTION: This exam was performed according to the departmental dose-optimization program which includes automated exposure control, adjustment of the mA and/or kV according to patient size and/or use of iterative reconstruction technique. COMPARISON:  None Available. FINDINGS: Brain: There is no acute intracranial hemorrhage, extra-axial fluid collection, or acute infarct. Parenchymal volume is normal. The ventricles are normal in size. Gray-white differentiation is preserved. Patchy hypodensity in the supratentorial white matter likely reflects sequela of mild underlying chronic  small-vessel ischemic change The pituitary and suprasellar region are normal. There is no mass lesion there is no mass effect or midline shift. Vascular: There is calcification of the bilateral carotid siphons. Skull: Normal. Negative for fracture or focal lesion. Sinuses/Orbits: The paranasal sinuses are essentially clear. Bilateral lens implants are in place. The globes and orbits are otherwise unremarkable. Other: The mastoid air cells and middle ear cavities are clear. IMPRESSION: No acute intracranial pathology. Electronically Signed   By: Lesia Hausen M.D.   On: 04/18/2023 12:02   CT Angio Chest/Abd/Pel for Dissection W and/or Wo Contrast  Result Date: 04/18/2023 CLINICAL DATA:  Chest pain and epigastric abdominal pain EXAM: CT ANGIOGRAPHY CHEST, ABDOMEN AND PELVIS TECHNIQUE: Non-contrast CT of the chest was initially obtained. Multidetector CT imaging through the chest, abdomen and pelvis was performed using the standard protocol during bolus administration of intravenous contrast. Multiplanar reconstructed images and MIPs were obtained and reviewed to evaluate the vascular anatomy. RADIATION DOSE REDUCTION: This exam was performed according to the departmental dose-optimization program which includes automated exposure control, adjustment of the mA and/or kV according to patient size and/or use of iterative reconstruction technique. CONTRAST:  80mL OMNIPAQUE IOHEXOL 350 MG/ML SOLN COMPARISON:  None Available. FINDINGS: CTA CHEST FINDINGS Cardiovascular: --Heart: The heart size is normal.  There is nopericardial effusion. --Aorta: The course and caliber of the thoracic aorta are normal. There is aortic atherosclerotic calcification. Precontrast images show no aortic intramural hematoma. There is no blood pool, dissection or penetrating ulcer demonstrated on arterial phase postcontrast imaging. There is a conventional 3 vessel aortic arch branching pattern. The proximal arch vessels are widely patent.  --Pulmonary Arteries: Contrast timing is optimized for preferential opacification of the aorta. Within that limitation, normal central pulmonary arteries. Mediastinum/Nodes: No mediastinal, hilar or axillary lymphadenopathy. The visualized thyroid and thoracic esophageal course are unremarkable. Lungs/Pleura: No pulmonary nodules or masses. No pleural effusion or pneumothorax. No focal airspace consolidation. No focal  pleural abnormality. Musculoskeletal: No chest wall abnormality. No acute osseous findings. Review of the MIP images confirms the above findings. CTA ABDOMEN AND PELVIS FINDINGS VASCULAR Aorta: Normal caliber aorta without aneurysm, dissection, vasculitis or hemodynamically significant stenosis. There is aortic atherosclerosis. Celiac: No aneurysm, dissection or hemodynamically significant stenosis. Normal branching pattern. SMA: Widely patent without dissection or stenosis. Renals: Single renal arteries bilaterally. No aneurysm, dissection, stenosis or evidence of fibromuscular dysplasia. IMA: Patent without abnormality. Inflow: No aneurysm, stenosis or dissection. Veins: Normal course and caliber of the major veins. Assessment is otherwise limited by the arterial dominant contrast phase. Review of the MIP images confirms the above findings. NON-VASCULAR Hepatobiliary: Normal hepatic contours and density. No visible biliary dilatation. Normal gallbladder. Pancreas: Normal contours without ductal dilatation. No peripancreatic fluid collection. Spleen: Normal arterial phase splenic enhancement pattern. Adrenals/Urinary Tract: --Adrenal glands: Normal. --Right kidney/ureter: No hydronephrosis or perinephric stranding. No nephrolithiasis. No obstructing ureteral stones. --Left kidney/ureter: No hydronephrosis or perinephric stranding. No nephrolithiasis. No obstructing ureteral stones. --Urinary bladder: Unremarkable. Stomach/Bowel: --Stomach/Duodenum: No hiatal hernia or other gastric abnormality. Normal  duodenal course and caliber. --Small bowel: No dilatation or inflammation. --Colon: Rectosigmoid diverticulosis without acute inflammation. --Appendix: Not visualized. No right lower quadrant inflammation or free fluid. Lymphatic:  No abdominal or pelvic lymphadenopathy. Reproductive: Normal uterus and ovaries. Musculoskeletal. No bony spinal canal stenosis or focal osseous abnormality. Other: None. Review of the MIP images confirms the above findings. IMPRESSION: 1. No acute aortic syndrome. 2. No acute abnormality of the chest, abdomen or pelvis. 3. Rectosigmoid diverticulosis without acute inflammation. Aortic Atherosclerosis (ICD10-I70.0). Electronically Signed   By: Deatra Robinson M.D.   On: 04/18/2023 03:53   US Abdomen Limited  Result Date: 04/18/2023 CLINICAL DATA:  Right upper quadrant pain. EXAM: ULTRASOUND ABDOMEN LIMITED RIGHT UPPER QUADRANT COMPARISON:  05/07/2022. FINDINGS: Gallbladder: No gallstones or wall thickening visualized. No sonographic Murphy sign noted by sonographer. Common bile duct: Diameter: 6.7 mm, within normal limits for patient's age. Liver: No focal lesion identified. Increased parenchymal echogenicity. Portal vein is patent on color Doppler imaging with normal direction of blood flow towards the liver. Other: No free fluid. IMPRESSION: 1. No cholelithiasis or acute cholecystitis. 2. Hepatic steatosis. Electronically Signed   By: Thornell Sartorius M.D.   On: 04/18/2023 02:18   DG Chest 2 View  Result Date: 04/17/2023 CLINICAL DATA:  Chest pain. EXAM: CHEST - 2 VIEW COMPARISON:  May 07, 2022 FINDINGS: The heart size and mediastinal contours are within normal limits. Both lungs are clear. The visualized skeletal structures are unremarkable. IMPRESSION: No active cardiopulmonary disease. Electronically Signed   By: Aram Candela M.D.   On: 04/17/2023 20:56    Pending Labs Unresulted Labs (From admission, onward)     Start     Ordered   04/19/23 0500  CBC  (enoxaparin  (LOVENOX)    CrCl >/= 30 ml/min)  Daily,   R     Comments: Baseline for enoxaparin therapy IF NOT ALREADY DRAWN.  Notify MD if PLT < 100 K.    04/18/23 0520   04/19/23 0500  Basic metabolic panel  Daily,   R      04/18/23 0520   04/19/23 0500  Lipid panel  Tomorrow morning,   R        04/18/23 1255   04/19/23 0500  Hemoglobin A1c  Tomorrow morning,   R        04/18/23 1255   04/19/23 0500  Lipoprotein A (LPA)  Tomorrow morning,  R        04/18/23 1300            Vitals/Pain Today's Vitals   04/18/23 1353 04/18/23 1502 04/18/23 1600 04/18/23 1601  BP:   125/72   Pulse:   65   Resp:   17   Temp:  97.8 F (36.6 C)    TempSrc:  Oral    SpO2:   97%   Weight:      Height:      PainSc: 0-No pain   0-No pain    Isolation Precautions No active isolations  Medications Medications  nitroGLYCERIN (NITROSTAT) SL tablet 0.4 mg (has no administration in time range)  enoxaparin (LOVENOX) injection 40 mg (40 mg Subcutaneous Given 04/18/23 1348)  pantoprazole (PROTONIX) EC tablet 40 mg (40 mg Oral Given 04/18/23 0945)  albuterol (PROVENTIL) (2.5 MG/3ML) 0.083% nebulizer solution 3 mL (has no administration in time range)  polyethylene glycol (MIRALAX / GLYCOLAX) packet 17 g (has no administration in time range)  alum & mag hydroxide-simeth (MAALOX/MYLANTA) 200-200-20 MG/5ML suspension 30 mL (has no administration in time range)  diphenhydrAMINE (BENADRYL) capsule 25 mg (has no administration in time range)  acetaminophen (TYLENOL) tablet 1,000 mg (has no administration in time range)  budesonide (PULMICORT) nebulizer solution 0.25 mg (0 mg Nebulization Hold 04/18/23 0815)  alum & mag hydroxide-simeth (MAALOX/MYLANTA) 200-200-20 MG/5ML suspension 30 mL (30 mLs Oral Given 04/17/23 2231)    And  lidocaine (XYLOCAINE) 2 % viscous mouth solution 15 mL (15 mLs Oral Given 04/17/23 2231)  famotidine (PEPCID) tablet 20 mg (20 mg Oral Given 04/17/23 2230)  sodium chloride 0.9 % bolus 1,000 mL (0  mLs Intravenous Stopped 04/18/23 0134)  methylPREDNISolone sodium succinate (SOLU-MEDROL) 40 mg/mL injection 40 mg (40 mg Intravenous Given 04/17/23 2324)  diphenhydrAMINE (BENADRYL) capsule 50 mg (50 mg Oral Given 04/18/23 0220)    Or  diphenhydrAMINE (BENADRYL) injection 50 mg ( Intravenous See Alternative 04/18/23 0220)  iohexol (OMNIPAQUE) 350 MG/ML injection 80 mL (80 mLs Intravenous Contrast Given 04/18/23 8295)    Mobility walks     Focused Assessments     R Recommendations: See Admitting Provider Note  Report given to:   Additional Notes:

## 2023-04-19 ENCOUNTER — Observation Stay (HOSPITAL_BASED_OUTPATIENT_CLINIC_OR_DEPARTMENT_OTHER): Payer: 59

## 2023-04-19 ENCOUNTER — Other Ambulatory Visit (HOSPITAL_COMMUNITY): Payer: Self-pay

## 2023-04-19 DIAGNOSIS — I2510 Atherosclerotic heart disease of native coronary artery without angina pectoris: Secondary | ICD-10-CM | POA: Diagnosis not present

## 2023-04-19 DIAGNOSIS — R011 Cardiac murmur, unspecified: Secondary | ICD-10-CM

## 2023-04-19 DIAGNOSIS — E782 Mixed hyperlipidemia: Secondary | ICD-10-CM

## 2023-04-19 DIAGNOSIS — R002 Palpitations: Secondary | ICD-10-CM | POA: Diagnosis not present

## 2023-04-19 DIAGNOSIS — I351 Nonrheumatic aortic (valve) insufficiency: Secondary | ICD-10-CM | POA: Diagnosis not present

## 2023-04-19 DIAGNOSIS — I10 Essential (primary) hypertension: Secondary | ICD-10-CM | POA: Diagnosis not present

## 2023-04-19 DIAGNOSIS — R0789 Other chest pain: Secondary | ICD-10-CM | POA: Diagnosis not present

## 2023-04-19 DIAGNOSIS — R079 Chest pain, unspecified: Secondary | ICD-10-CM | POA: Diagnosis not present

## 2023-04-19 LAB — CBC
HCT: 39.5 % (ref 36.0–46.0)
Hemoglobin: 12.7 g/dL (ref 12.0–15.0)
MCH: 26.8 pg (ref 26.0–34.0)
MCHC: 32.2 g/dL (ref 30.0–36.0)
MCV: 83.5 fL (ref 80.0–100.0)
Platelets: 428 10*3/uL — ABNORMAL HIGH (ref 150–400)
RBC: 4.73 MIL/uL (ref 3.87–5.11)
RDW: 14.2 % (ref 11.5–15.5)
WBC: 18.1 10*3/uL — ABNORMAL HIGH (ref 4.0–10.5)
nRBC: 0 % (ref 0.0–0.2)

## 2023-04-19 LAB — BASIC METABOLIC PANEL
Anion gap: 9 (ref 5–15)
BUN: 14 mg/dL (ref 8–23)
CO2: 23 mmol/L (ref 22–32)
Calcium: 9 mg/dL (ref 8.9–10.3)
Chloride: 104 mmol/L (ref 98–111)
Creatinine, Ser: 1.17 mg/dL — ABNORMAL HIGH (ref 0.44–1.00)
GFR, Estimated: 50 mL/min — ABNORMAL LOW (ref >60)
Glucose, Bld: 115 mg/dL — ABNORMAL HIGH (ref 70–99)
Potassium: 3.6 mmol/L (ref 3.5–5.1)
Sodium: 136 mmol/L (ref 135–145)

## 2023-04-19 LAB — HEMOGLOBIN A1C
Hgb A1c MFr Bld: 7 % — ABNORMAL HIGH (ref 4.8–5.6)
Mean Plasma Glucose: 154.2 mg/dL

## 2023-04-19 LAB — ECHOCARDIOGRAM COMPLETE
AR max vel: 2.06 cm2
AV Peak grad: 10.2 mmHg
Ao pk vel: 1.6 m/s
Area-P 1/2: 4.1 cm2
Height: 61 in
S' Lateral: 2.9 cm
Weight: 2288 oz

## 2023-04-19 LAB — LIPID PANEL
Cholesterol: 185 mg/dL (ref 0–200)
HDL: 45 mg/dL (ref >40)
LDL Cholesterol: 111 mg/dL — ABNORMAL HIGH (ref 0–99)
Total CHOL/HDL Ratio: 4.1 RATIO
Triglycerides: 147 mg/dL (ref <150)
VLDL: 29 mg/dL (ref 0–40)

## 2023-04-19 MED ORDER — PANTOPRAZOLE SODIUM 40 MG PO TBEC
40.0000 mg | DELAYED_RELEASE_TABLET | Freq: Every day | ORAL | 0 refills | Status: DC
  Filled 2023-04-19: qty 30, 30d supply, fill #0

## 2023-04-19 MED ORDER — ROSUVASTATIN CALCIUM 5 MG PO TABS
5.0000 mg | ORAL_TABLET | Freq: Every day | ORAL | Status: DC
  Administered 2023-04-19: 5 mg via ORAL
  Filled 2023-04-19: qty 1

## 2023-04-19 MED ORDER — PANTOPRAZOLE SODIUM 40 MG PO TBEC: 40.0000 mg | DELAYED_RELEASE_TABLET | Freq: Every day | ORAL | 0 refills | Status: DC

## 2023-04-19 MED ORDER — ROSUVASTATIN CALCIUM 5 MG PO TABS
5.0000 mg | ORAL_TABLET | Freq: Every day | ORAL | 0 refills | Status: DC
  Filled 2023-04-19: qty 30, 30d supply, fill #0

## 2023-04-19 MED ORDER — ROSUVASTATIN CALCIUM 5 MG PO TABS: 5.0000 mg | ORAL_TABLET | Freq: Every day | ORAL | 0 refills | Status: DC

## 2023-04-19 NOTE — Discharge Summary (Signed)
Family Medicine Teaching [facility] Discharge Summary  Patient name: Diana Roach Medical record number: [id number] Date of birth: 10-Nov-1950 Age: 72 y.o. Gender: female Date of Admission: 04/17/2023  Date of Discharge: 04/19/23 Admitting Physician: Alfredo Martinez, MD  Primary Care Provider: Alicia Amel, MD Consultants: Cardiology  Indication for Hospitalization: ACS work-up  Discharge Diagnoses/Problem List:  Principal Problem for Admission: Chest pain/ACS work-up Other Problems addressed during stay:  Principal Problem:   Chest pain    Brief Hospital Course:  Diana Roach is a 72 y.o.female with a history of diabetes, GERD who was admitted to the [facility] Teaching Service at [facility] for chest pain. Her hospital course is detailed below:  Chest/Epigastric Pain In ED, patient had negative trops, EKG negative, CTA chest, lipase, and CXR unremarkable. Cardiology was consulted by EDP and have recommended CT coronaries and outpatient Zio patch. Pain resolved with GI cocktail.  Instructed to take daily PPI at discharge.  Patient has scheduled follow up with cardiology outpatient.   Heart murmur Heard on exam, echo showed small pericardial effusion without tamponade, EF 50 to 55%.  Will follow-up with cardiology outpatient.  DM2 Received steroid in the ED. Controlled with SSI, discharged on home regimen, diet controlled. A1c 7.0.  Other chronic conditions were medically managed with home medications and formulary alternatives as necessary (Asthma, GERD)  PCP Follow-up Recommendations: Follow up with cardiology.  Disposition: home  Discharge Condition: stable   Discharge Exam:  Vitals:   04/19/23 0902 04/19/23 1047  BP:  (!) 156/76  Pulse:  (!) 55  Resp:  20  Temp:  97.8 F (36.6 C)  SpO2: 97% 94%   General: NAD  Neuro: A&O Cardiovascular: RRR, no murmurs, no peripheral edema Respiratory: normal WOB on RA, CTAB, no wheezes, ronchi or  rales Abdomen: soft, NTTP, no rebound or guarding Extremities: Moving all 4 extremities equally   Significant Procedures: none  Significant Labs and Imaging:  Recent Labs  Lab 04/17/23 2018 04/19/23 0039  WBC 12.7* 18.1*  HGB 12.6 12.7  HCT 39.0 39.5  PLT 401* 428*   Recent Labs  Lab 04/17/23 2018 04/19/23 0039  NA 139 136  K 3.7 3.6  CL 105 104  CO2 23 23  GLUCOSE 116* 115*  BUN 10 14  CREATININE 0.90 1.17*  CALCIUM 9.5 9.0  ALKPHOS 59  --   AST 19  --   ALT 22  --   ALBUMIN 3.7  --    CT Head No acute intracranial pathology.   CT Angio Chest/Abd/Pel 1. No acute aortic syndrome. 2. No acute abnormality of the chest, abdomen or pelvis. 3. Rectosigmoid diverticulosis without acute inflammation.  Results/Tests Pending at Time of Discharge: Lipoprotein A  Discharge Medications:  Allergies as of 04/19/2023       Reactions   Codeine Itching, Hives   Augmentin [amoxicillin-pot Clavulanate] Other (See Comments)   Felt "clammy and like I was going to pass out" with vomiting.    Contrast Media [iodinated Contrast Media] Other (See Comments)   BP dropped   Levaquin [levofloxacin] Other (See Comments)   Hallucinations   Minocycline Other (See Comments)   Dizziness , "strange feeling"        Medication List     STOP taking these medications    cephALEXin 500 MG capsule Commonly known as: KEFLEX   gabapentin 100 MG capsule Commonly known as: NEURONTIN   loratadine 10 MG tablet Commonly known as: EQ Loratadine   meloxicam 15 MG  tablet Commonly known as: MOBIC   omeprazole 20 MG capsule Commonly known as: PRILOSEC       TAKE these medications    acetaminophen 325 MG tablet Commonly known as: TYLENOL Take 650 mg by mouth every 6 (six) hours as needed for mild pain or headache.   albuterol 108 (90 Base) MCG/ACT inhaler Commonly known as: VENTOLIN HFA USE 2 INHALATIONS BY MOUTH EVERY 6 HOURS AS NEEDED FOR WHEEZING  FOR SHORTNESS OF BREATH What  changed:  how much to take how to take this when to take this reasons to take this additional instructions   alendronate 70 MG tablet Commonly known as: FOSAMAX Take 1 tablet (70 mg total) by mouth every 7 (seven) days. Take with a full glass of water on an empty stomach.   Arnuity Ellipta 100 MCG/ACT Aepb Generic drug: Fluticasone Furoate Inhale 100 mcg into the lungs daily.   Calcium Carbonate-Vitamin D 600-10 MG-MCG Tabs Take 1 tablet by mouth daily.   clobetasol ointment 0.05 % Commonly known as: TEMOVATE Apply 1 Application topically at bedtime. What changed:  when to take this reasons to take this   pantoprazole 40 MG tablet Commonly known as: Protonix Take 1 tablet (40 mg total) by mouth daily.   polyethylene glycol 17 g packet Commonly known as: MIRALAX / GLYCOLAX Take 17 g by mouth daily. What changed:  when to take this reasons to take this   rosuvastatin 5 MG tablet Commonly known as: CRESTOR Take 1 tablet (5 mg total) by mouth daily.   SYSTANE OP Apply 1 drop to eye 4 (four) times daily as needed (for dry eye).        Discharge Instructions: Please refer to Patient Instructions section of EMR for full details.  Patient was counseled important signs and symptoms that should prompt return to medical care, changes in medications, dietary instructions, activity restrictions, and follow up appointments.   Follow-Up Appointments:  Follow-up Information     Christell Constant, MD Follow up on 05/10/2023.   Specialty: Cardiology Why: with his Nurse Practitioner Mischelle Swinyer  at 10:55 AM please arrive 15 min early to check in Contact information: [address] [place] [address] [phone]                 Celine Mans, MD 04/19/2023, 12:21 PM PGY-1, [facility]

## 2023-04-19 NOTE — Care Management Obs Status (Signed)
MEDICARE OBSERVATION STATUS NOTIFICATION   Patient Details  Name: Diana Roach MRN: [id number] Date of Birth: 20-Sep-1951   Medicare Observation Status Notification Given:  Yes    Leone Haven, RN 04/19/2023, 11:48 AM

## 2023-04-19 NOTE — Discharge Instructions (Addendum)
Dear Everlene Farrier,  Thank you for letting us participate in your care. You were hospitalized to make sure you were not having a heart attack.  Your pain is most likely due to heartburn and gas pain.  POST-HOSPITAL & CARE INSTRUCTIONS Please continue taking your medications as below Please follow-up with in our clinic to discuss further care. Go to your follow up appointments (listed below)   DOCTOR'S APPOINTMENT   Future Appointments  Date Time Provider Department Center  04/29/2023  9:20 AM Lincoln Brigham, MD [facility]  05/04/2023  2:45 PM Louann Sjogren, DPM [facility]  05/10/2023 10:55 AM Lissa Hoard, Zachary George, NP [facility]  01/03/2024  9:30 AM [facility] ANNUAL WELLNESS VISIT [facility]    Follow-up Information     Christell Constant, MD Follow up on 05/10/2023.   Specialty: Cardiology Why: with his Nurse Practitioner Mischelle Swinyer  at 10:55 AM please arrive 15 min early to check in Contact information: [address] [place] [address] [phone]                 Take care and be well!  Family Medicine Teaching Service Inpatient Team [place]  [facility]  [address] [place], [place] [address] [phone]

## 2023-04-19 NOTE — Assessment & Plan Note (Signed)
Vital signs continue to be stable. Pain resolved. -Touch base with cardiology in a.m. -Repeat echo -Nitroglycerin as needed -Tylenol 1000 mg every 6 as needed -Repeat GI cocktail as needed -Resume home PPI, consider increasing dose though cautious with osteopenia -Heart healthy diet as tolerated -Vitals per unit routine with cardiorespiratory monitoring

## 2023-04-19 NOTE — TOC Transition Note (Signed)
Transition of Care [facility]) - CM/SW Discharge Note   Patient Details  Name: Diana Roach MRN: [id number] Date of Birth: [birth year]  Transition of Care [facility]) CM/SW Contact:  Leone Haven, RN Phone Number: 04/19/2023, 12:34 PM   Clinical Narrative:    Patient is for dc today, has no needs.   Final next level of care: Home/Self Care Barriers to Discharge: Continued Medical Work up   Patient Goals and CMS Choice   Choice offered to / list presented to : NA  Discharge Placement                         Discharge Plan and Services Additional resources added to the After Visit Summary for   In-house Referral: NA Discharge Planning Services: CM Consult Post Acute Care Choice: NA          DME Arranged: N/A DME Agency: NA       HH Arranged: NA          Social Determinants of Health (SDOH) Interventions SDOH Screenings   Food Insecurity: No Food Insecurity (12/13/2022)  Housing: Low Risk  (12/13/2022)  Transportation Needs: No Transportation Needs (12/13/2022)  Utilities: Not At Risk (12/13/2022)  Alcohol Screen: Low Risk  (12/13/2022)  Depression (PHQ2-9): Low Risk  (04/13/2023)  Financial Resource Strain: Low Risk  (12/13/2022)  Physical Activity: Insufficiently Active (12/13/2022)  Social Connections: Moderately Integrated (12/13/2022)  Stress: No Stress Concern Present (12/13/2022)  Tobacco Use: Medium Risk (04/18/2023)     Readmission Risk Interventions     No data to display

## 2023-04-19 NOTE — Progress Notes (Signed)
Echocardiogram 2D Echocardiogram has been performed.  Lucendia Herrlich 04/19/2023, 10:16 AM

## 2023-04-19 NOTE — Progress Notes (Signed)
Progress Note  Patient Name: Diana Roach Date of Encounter: 04/19/2023 Primary Cardiologist: None   Subjective   Overnight LDL above goal, A1c similar to January. Patient notes that she is still having palpitations: sinus bradycardia and sinus rhythm on heart monitor No Sob. CP has greatly improved  Vital Signs    Vitals:   04/18/23 2040 04/19/23 0040 04/19/23 0444 04/19/23 0813  BP:  (!) 148/62 130/71 136/75  Pulse:  60 60 (!) 58  Resp:  20 11 18   Temp:  97.8 F (36.6 C) 98 F (36.7 C) 98.1 F (36.7 C)  TempSrc:  Oral Oral Oral  SpO2: 97% 96% 95% 98%  Weight:   64.9 kg   Height:        Intake/Output Summary (Last 24 hours) at 04/19/2023 0827 Last data filed at 04/19/2023 0813 Gross per 24 hour  Intake 480 ml  Output --  Net 480 ml   Filed Weights   04/17/23 2326 04/18/23 1925 04/19/23 0444  Weight: 65.8 kg 65.7 kg 64.9 kg    Physical Exam   GEN: No acute distress.   Neck: No JVD Cardiac: RRR, holodiastolic murmur, no rubs, or gallops.  Respiratory: Clear to auscultation bilaterally. GI: Soft, nontender, non-distended  MS: No edema  Labs   Telemetry: SR to sinus bradycardia   Chemistry Recent Labs  Lab 04/13/23 1227 04/17/23 2018 04/19/23 0039  NA 138 139 136  K 4.4 3.7 3.6  CL 100 105 104  CO2 23 23 23   GLUCOSE 135* 116* 115*  BUN 16 10 14   CREATININE 1.22* 0.90 1.17*  CALCIUM 9.9 9.5 9.0  PROT  --  7.2  --   ALBUMIN  --  3.7  --   AST  --  19  --   ALT  --  22  --   ALKPHOS  --  59  --   BILITOT  --  0.6  --   GFRNONAA  --  >60 50*  ANIONGAP  --  11 9     Hematology Recent Labs  Lab 04/13/23 1227 04/17/23 2018 04/19/23 0039  WBC 15.8* 12.7* 18.1*  RBC 4.87 4.61 4.73  HGB 13.4 12.6 12.7  HCT 41.4 39.0 39.5  MCV 85 84.6 83.5  MCH 27.5 27.3 26.8  MCHC 32.4 32.3 32.2  RDW 14.1 14.2 14.2  PLT 336 401* 428*    Cardiac EnzymesNo results for input(s): "TROPONINI" in the last 168 hours. No results for input(s):  "TROPIPOC" in the last 168 hours.   BNPNo results for input(s): "BNP", "PROBNP" in the last 168 hours.   DDimer  Recent Labs  Lab 04/17/23 2018  DDIMER 0.73*     Cardiac Studies   Cardiac Studies & Procedures       ECHOCARDIOGRAM  ECHOCARDIOGRAM COMPLETE 03/22/2019  Narrative ECHOCARDIOGRAM REPORT    Patient Name:   Diana Roach Date of Exam: 03/22/2019 Medical Rec #:  [id number]        Height:       61.0 in Accession #:    [id number]       Weight:       147.4 lb Date of Birth:  11/27/1950        BSA:          1.66 m Patient Age:    67 years         BP:           155/110 mmHg Patient Gender: F  HR:           62 bpm. Exam Location:  Outpatient   Procedure: 2D Echo, Cardiac Doppler, Color Doppler and Strain Analysis  Indications:    Palpitations  History:        Patient has no prior history of Echocardiogram examinations.  Sonographer:    Irving Burton Senior Referring Phys: [id number] Estevan Ryder MCINTYRE  IMPRESSIONS   1. The left ventricle has normal systolic function with an ejection fraction of 60-65%. The cavity size was normal. Left ventricular diastolic Doppler parameters are consistent with pseudonormalization. 2. The right ventricle has normal systolic function. The cavity was normal. There is no increase in right ventricular wall thickness. 3. Aortic valve regurgitation is trivial by color flow Doppler. No stenosis of the aortic valve. 4. The aortic root and ascending aorta are normal in size and structure. 5. The average left ventricular global longitudinal strain is -15.7 %. 6. The interatrial septum was not assessed.  FINDINGS Left Ventricle: The left ventricle has normal systolic function, with an ejection fraction of 60-65%. The cavity size was normal. There is no increase in left ventricular wall thickness. Left ventricular diastolic Doppler parameters are consistent with pseudonormalization. The average left ventricular global longitudinal strain  is -15.7 %.  Right Ventricle: The right ventricle has normal systolic function. The cavity was normal. There is no increase in right ventricular wall thickness.  Left Atrium: Left atrial size was normal in size.  Right Atrium: Right atrial size was normal in size. Right atrial pressure is estimated at 10 mmHg.  Interatrial Septum: The interatrial septum was not assessed.  Pericardium: There is no evidence of pericardial effusion.  Mitral Valve: The mitral valve is normal in structure. Mitral valve regurgitation is trivial by color flow Doppler.  Tricuspid Valve: The tricuspid valve is normal in structure. Tricuspid valve regurgitation is trivial by color flow Doppler.  Aortic Valve: The aortic valve is normal in structure. Aortic valve regurgitation is trivial by color flow Doppler. There is No stenosis of the aortic valve.  Pulmonic Valve: The pulmonic valve was grossly normal. Pulmonic valve regurgitation is not visualized by color flow Doppler.  Aorta: The aortic root and ascending aorta are normal in size and structure.   +--------------+--------++ LEFT VENTRICLE         +----------------+---------++ +--------------+--------++ Diastology                PLAX 2D                +----------------+---------++ +--------------+--------++ LV e' lateral:  8.16 cm/s LVIDd:        4.10 cm  +----------------+---------++ +--------------+--------++ LV E/e' lateral:9.9       LVIDs:        2.60 cm  +----------------+---------++ +--------------+--------++ LV e' medial:   7.18 cm/s LV PW:        0.70 cm  +----------------+---------++ +--------------+--------++ LV E/e' medial: 11.3      LV IVS:       0.70 cm  +----------------+---------++ +--------------+--------++ LVOT diam:    1.70 cm  +----------------------+-------++ +--------------+--------++ 2D Longitudinal Strain        LV SV:        50 ml     +----------------------+-------++ +--------------+--------++ 2D Strain GLS Avg:    -15.7 % LV SV Index:  28.90    +----------------------+-------++ +--------------+--------++ LVOT Area:    2.27 cm +--------------+--------++                        +--------------+--------++  +---------------+----------++  RIGHT VENTRICLE           +---------------+----------++ RV S prime:    12.60 cm/s +---------------+----------++ TAPSE (M-mode):2.2 cm     +---------------+----------++  +---------------+-------++-----------++ LEFT ATRIUM           Index       +---------------+-------++-----------++ LA diam:       3.00 cm1.81 cm/m  +---------------+-------++-----------++ LA Vol (A2C):  37.6 ml22.67 ml/m +---------------+-------++-----------++ LA Vol (A4C):  20.2 ml12.18 ml/m +---------------+-------++-----------++ LA Biplane Vol:27.8 ml16.76 ml/m +---------------+-------++-----------++ +------------+---------++-----------++ RIGHT ATRIUM         Index       +------------+---------++-----------++ RA Pressure:3.00 mmHg            +------------+---------++-----------++ RA Area:    11.60 cm            +------------+---------++-----------++ RA Volume:  28.50 ml 17.18 ml/m +------------+---------++-----------++ +------------+-----------++ AORTIC VALVE            +------------+-----------++ LVOT Vmax:  107.00 cm/s +------------+-----------++ LVOT Vmean: 68.600 cm/s +------------+-----------++ LVOT VTI:   0.233 m     +------------+-----------++  +-------------+-------++ AORTA                +-------------+-------++ Ao Root diam:2.80 cm +-------------+-------++  +--------------+----------++ +---------------+---------++ MITRAL VALVE             TRICUSPID VALVE          +--------------+----------++ +---------------+---------++ MV Area (PHT):3.48 cm   Estimated RAP:  3.00 mmHg +--------------+----------++ +---------------+---------++ MV PHT:       63.22 msec +--------------+----------++ +--------------+-------+ MV Decel Time:218 msec   SHUNTS                +--------------+----------++ +--------------+-------+ +--------------+----------++ Systemic VTI: 0.23 m  MV E velocity:81.00 cm/s +--------------+-------+ +--------------+----------++ Systemic Diam:1.70 cm MV A velocity:85.90 cm/s +--------------+-------+ +--------------+----------++ MV E/A ratio: 0.94       +--------------+----------++   Kristeen Miss MD Electronically signed by Kristeen Miss MD Signature Date/Time: 03/22/2019/12:45:01 PM    Final    MONITORS  LONG TERM MONITOR (3-14 DAYS) 06/09/2021  Narrative  NSR with ave HR 68 bpm and range 46-125 bpm  One 4 beat SVT salvo  PAC and PVC burden < 1% each   Patch Wear Time:  2 days and 22 hours (2022-07-31T13:10:00-0400 to 2022-08-03T11:42:21-0400)  Patient had a min HR of 46 bpm, max HR of 125 bpm, and avg HR of 68 bpm. Predominant underlying rhythm was Sinus Rhythm. 1 run of Supraventricular Tachycardia occurred lasting 4 beats with a max rate of 104 bpm (avg 101 bpm). Isolated SVEs were rare (<1.0%), SVE Couplets were rare (<1.0%), and SVE Triplets were rare (<1.0%). Isolated VEs were rare (<1.0%), and no VE Couplets or VE Triplets were present.           Assessment & Plan   Palpitations - symptomatic without any electrical abnormalities- this is non cardiac in nature; will defer heart monitor  HLD CAC Myalgia due to statin - she did well in the past with rosuvastatin 5 mg but had myalgias at 20 mg; will return 5 mg dose  Atypical CP GERD Contrast allergy - as per primary - as outpatient, may consider exercise NM Stress tess if cardiac CP occurs - discussed dietary contributors to reflux  Mild AI - echo in three years  White coat HTN - will start ambulatory BP  monitoring  Planned Sign off -  rosuvastatin 5 mg; outpatient f/u     For questions or updates, please contact [facility]  Please consult [email or web address] for contact info under Cardiology/STEMI.      Riley Lam, MD FASE [facility] Cardiologist [facility]  [address] [place], [address] [place], [place] [address] [phone]  8:27 AM

## 2023-04-19 NOTE — TOC Initial Note (Signed)
Transition of Care [facility]) - Initial/Assessment Note    Patient Details  Name: Diana Roach MRN: [id number] Date of Birth: [birth year]  Transition of Care [facility]) CM/SW Contact:    Leone Haven, RN Phone Number: 04/19/2023, 11:56 AM  Clinical Narrative:                 From home with daughter, Rinaldo Cloud, indep.  She has PCP and insurance on file, she currently has no HH services in place or DME that she is currently using.  Rinaldo Cloud will transport her home at discharge.  Rinaldo Cloud is her support system.  She gets her medications from [facility] [address], and [facility] delivers medications also.  Per pt eval no F/u needed.   Expected Discharge Plan: Home/Self Care Barriers to Discharge: Continued Medical Work up   Patient Goals and CMS Choice Patient states their goals for this hospitalization and ongoing recovery are:: return home with daughter   Choice offered to / list presented to : NA      Expected Discharge Plan and Services In-house Referral: NA Discharge Planning Services: CM Consult Post Acute Care Choice: NA Living arrangements for the past 2 months: Single Family Home                 DME Arranged: N/A DME Agency: NA       HH Arranged: NA          Prior Living Arrangements/Services Living arrangements for the past 2 months: Single Family Home Lives with:: Adult Children Patient language and need for interpreter reviewed:: Yes Do you feel safe going back to the place where you live?: Yes        Care giver support system in place?: Yes (comment)   Criminal Activity/Legal Involvement Pertinent to Current Situation/Hospitalization: No - Comment as needed  Activities of Daily Living Home Assistive Devices/Equipment: None ADL Screening (condition at time of admission) Patient's cognitive ability adequate to safely complete daily activities?: Yes Is the patient deaf or have difficulty hearing?: No Does the patient have difficulty seeing, even when wearing  glasses/contacts?: No Does the patient have difficulty concentrating, remembering, or making decisions?: No Patient able to express need for assistance with ADLs?: Yes Does the patient have difficulty dressing or bathing?: No Independently performs ADLs?: Yes (appropriate for developmental age) Does the patient have difficulty walking or climbing stairs?: No Weakness of Legs: None Weakness of Arms/Hands: None  Permission Sought/Granted Permission sought to share information with : Case Manager Permission granted to share information with : Yes, Verbal Permission Granted              Emotional Assessment Appearance:: Appears stated age Attitude/Demeanor/Rapport: Engaged Affect (typically observed): Appropriate Orientation: : Oriented to Self, Oriented to Place, Oriented to  Time, Oriented to Situation Alcohol / Substance Use: Not Applicable Psych Involvement: No (comment)  Admission diagnosis:  Epigastric abdominal pain [R10.13] Chest pain [R07.9] Chest pain, unspecified type [R07.9] Patient Active Problem List   Diagnosis Date Noted   Chest pain 04/18/2023   Candidate for statin therapy due to risk of future cardiovascular event 12/15/2022   Lower extremity pain 11/18/2022   RUQ pain 05/20/2022   Long COVID 03/26/2022   Osteopenia 11/23/2019   GERD (gastroesophageal reflux disease) 06/19/2019   Palpitations 02/21/2019   Type 2 diabetes mellitus with diabetic neuropathy, without long-term current use of insulin (HCC) 10/24/2017   Varicose vein of leg 03/09/2016   UPJ obstruction, congenital 01/28/2016   Lumbosacral radiculopathy 01/27/2016  Diverticulosis 10/01/2014   Asthma, moderate persistent 05/13/2011   Lichen planus 02/03/2011   Hypercholesteremia 12/22/2006   PCP:  Alicia Amel, MD Pharmacy:   [facility] [address] [place] [address] Phone: [phone] Fax: [phone]  [facility] - [place], [place] - [address] [place] [address] Phone: [phone] Fax: [phone]  [facility] [address] [place]), [place] - [address] [place] [address] Phone: [phone] Fax: [phone]  [facility] [address] [place] [address] Phone: [phone] Fax: [phone]     Social Determinants of Health (SDOH) Social History: SDOH Screenings   Food Insecurity: No Food Insecurity (12/13/2022)  Housing: Low Risk  (12/13/2022)  Transportation Needs: No Transportation Needs (12/13/2022)  Utilities: Not At Risk (12/13/2022)  Alcohol Screen: Low Risk  (12/13/2022)  Depression (PHQ2-9): Low Risk  (04/13/2023)  Financial Resource Strain: Low Risk  (12/13/2022)  Physical Activity: Insufficiently Active (12/13/2022)  Social Connections: Moderately Integrated (12/13/2022)  Stress: No Stress Concern Present (12/13/2022)  Tobacco Use: Medium Risk (04/18/2023)   SDOH Interventions:     Readmission Risk Interventions     No data to display

## 2023-04-19 NOTE — Plan of Care (Signed)

## 2023-04-19 NOTE — Consult Note (Signed)
   [facility] CM Inpatient Consult   04/19/2023  Diana Roach 07-Aug-1951 [id number]  [facility] [[facility]]  Accountable Care Organization [ACO] Patient:  Primary Care Provider:  Alicia Amel, MD [facility]   Patient screened for hospitalization with noted to be in observation status currently to assess for potential [facility]  [[facility]] Care Management service needs for post hospital transition for care coordination.  Review of patient's electronic medical record reveals patient is admitted with chest pain.  Met with patient and daughter at bedside and explained potential post hospital follow up call for transitioning home for any community follow up needs.  Patient denies any SDOH and was given an appointment  reminder card for follow up needs. Patient was accepting and gracious.   Plan:  Continue to follow progress and disposition to assess for post hospital community care coordination/management needs.  Referral request for community care coordination: Anticipate Community TOC follow up  Of note, [facility] Care Management/Population Health does not replace or interfere with any arrangements made by the Inpatient Transition of Care team.  For questions contact:   Charlesetta Shanks, RN BSN CCM [facility]  [phone] business mobile phone Toll free office [phone]  *Concierge Line  [phone] Fax number: [phone] [email or web address]

## 2023-04-20 LAB — LIPOPROTEIN A (LPA): Lipoprotein (a): 58.1 nmol/L — ABNORMAL HIGH (ref <75.0)

## 2023-04-26 ENCOUNTER — Ambulatory Visit (HOSPITAL_COMMUNITY): Payer: 59

## 2023-04-27 ENCOUNTER — Ambulatory Visit: Payer: 59 | Admitting: Podiatry

## 2023-04-29 ENCOUNTER — Ambulatory Visit (INDEPENDENT_AMBULATORY_CARE_PROVIDER_SITE_OTHER): Payer: 59 | Admitting: Family Medicine

## 2023-04-29 ENCOUNTER — Encounter: Payer: Self-pay | Admitting: Family Medicine

## 2023-04-29 ENCOUNTER — Other Ambulatory Visit: Payer: Self-pay

## 2023-04-29 VITALS — BP 124/68 | HR 64 | Ht 61.0 in | Wt 144.4 lb

## 2023-04-29 DIAGNOSIS — K219 Gastro-esophageal reflux disease without esophagitis: Secondary | ICD-10-CM

## 2023-04-29 DIAGNOSIS — M810 Age-related osteoporosis without current pathological fracture: Secondary | ICD-10-CM | POA: Diagnosis not present

## 2023-04-29 NOTE — Assessment & Plan Note (Signed)
Was unable to tolerate alendronate due to esophagitis.  Currently taking vitamin D, calcium, multivitamin.  Patient would benefit from IV Reclast for osteoporosis with lower risk of esophagitis. CrCl 45 ml/min.  Calcium within normal limits.  Will check vitamin D today, if this is normal then we will start patient on IV Reclast once every 12 months for 3 years, and then reevaluate for need to continue.  If vitamin D is low, will replete this before starting Reclast.

## 2023-04-29 NOTE — Assessment & Plan Note (Addendum)
Pain is much improved with Protonix.  Cause of exacerbation was most likely bisphosphonate leading to esophagitis. -Continue Protonix 40mg  daily

## 2023-04-29 NOTE — Patient Instructions (Addendum)
Good to see you today - Thank you for coming in  Things we discussed today:  1) You are recovering well after your hospitalization.  - Follow-up with the Cardiologist - Stop taking your alendronate - Continue taking your Vitamin D, calcium, and multivitamin - We will check your Vitamin D level today. If it is normal, then I will start you on a medication called Reclast. It is an IV version of alendronate that will help with your bone strength.

## 2023-04-29 NOTE — Progress Notes (Signed)
    SUBJECTIVE:   CHIEF COMPLAINT / HPI:   PL is a 72yo F p/f hospital f/u. - Was hospitalized for chest pain. ACS was ruled out. Pt was treated with Protonix and improved. - still feeling heart "fluttering" sensatyion that comes and goes - Prior to admission, reports epigastric pain and L arm pain. - Feels like protonix has been helping - Feels like alendronate made the discomfort start, then when she missed a dose she felt better  - also made her constipated - hasn't taken alendronate since discharge -She is currently taking vitamin D, calcium, and a multivitamin   PERTINENT  PMH / PSH: Osteoporosis, GERD diabetes  OBJECTIVE:   BP 124/68   Pulse 64   Ht 5\' 1"  (1.549 m)   Wt 144 lb 6.4 oz (65.5 kg)   SpO2 95%   BMI 27.28 kg/m   General: Alert, pleasant woman. NAD. HEENT: NCAT. MMM. CV: RRR, no murmurs. Resp: CTAB, no wheezing or crackles. Normal WOB on RA.  Abm: Tender to palpation in epigastrium. Soft, nondistended. BS present. Ext: Moves all ext spontaneously Skin: Warm, well perfused   ASSESSMENT/PLAN:   GERD (gastroesophageal reflux disease) Pain is much improved with Protonix.  Cause of exacerbation was most likely bisphosphonate leading to esophagitis. -Continue Protonix 40mg  daily  Osteoporosis Was unable to tolerate alendronate due to esophagitis.  Currently taking vitamin D, calcium, multivitamin.  Patient would benefit from IV Reclast for osteoporosis with lower risk of esophagitis. CrCl 45 ml/min.  Calcium within normal limits.  Will check vitamin D today, if this is normal then we will start patient on IV Reclast once every 12 months for 3 years, and then reevaluate for need to continue.  If vitamin D is low, will replete this before starting Reclast.   Lincoln Brigham, MD [facility]

## 2023-04-30 LAB — VITAMIN D 25 HYDROXY (VIT D DEFICIENCY, FRACTURES): Vit D, 25-Hydroxy: 34.6 ng/mL (ref 30.0–100.0)

## 2023-05-04 ENCOUNTER — Ambulatory Visit: Payer: 59 | Admitting: Podiatry

## 2023-05-04 ENCOUNTER — Telehealth: Payer: Self-pay | Admitting: Family Medicine

## 2023-05-06 ENCOUNTER — Telehealth: Payer: Self-pay | Admitting: Family Medicine

## 2023-05-06 NOTE — Telephone Encounter (Signed)
VOID

## 2023-05-08 NOTE — Progress Notes (Unsigned)
Cardiology Office Note:  .   Date:  05/10/2023  ID:  Diana Roach, DOB 05-01-1951, MRN [id number] PCP: Alicia Amel, MD  [facility] Providers Cardiologist:  None    Patient Profile: .      PMH Hypertension Whitecoat hypertension Hyperlipidemia Type 2 DM GERD  Evaluated for palpitations in 2022.  She wore a cardiac monitor with no concerning arrhythmias.  Echo at that time showed LVEF 60 to 65% with trivial AR.    Seen by family medicine on 04/13/2023 for palpitations x 2 weeks.  She was not having any chest pain or syncope but was experiencing dizziness and fatigue.  Outpatient cardiology was consulted to but she had not yet been to appointment.  Admission 6/24-6/25/24 with complaints of epigastric/lower chest pain that radiates to her back for 2 days.  She was given a GI cocktail in the ED and her symptoms improved.  Labs were unremarkable including negative troponin.  EKG was normal sinus rhythm with no acute changes.  Cardiology was consulted.  She reported palpitations and chest pain did not occur together.  She had no electrical abnormalities on telemetry.  Echo 04/19/2023 revealed LVEF 50 to 55%, no RWMA, indeterminate diastolic parameters, small circumferential pericardial effusion with no evidence of cardiac tamponade, trivial MR and trivial AI. Recommendation to repeat echo in 3 years. Lipoprotein a is 58.1, LDL 111, hemoglobin A1C 7.0. Advised to start rosuvastatin 5 mg daily.        History of Present Illness: .   Diana Roach is a very pleasant 72 y.o. female who is here today for hospital follow-up. She reports she continues to have symptoms of heart fluttering but no chest or arm discomfort. Symptoms prior to hospitalization were worse than symptoms while hospitalized. Has discomfort when she bends over to pet her dog. History of chronic constipation for which she takes Miralax and fiber supplement.  She has resumed going to the gym 3 days/week for  walking and weightlifting.  She is not having any significant symptoms with exercise.  No increase in palpitations. No orthopnea, PND, presyncope, syncope. We discussed high fiber, high protein diet to help ease her GI symptoms.   ROS: See HPI       Studies Reviewed: .         Risk Assessment/Calculations:             Physical Exam:   VS:  BP 122/78   Pulse 73   Ht 5\' 1"  (1.549 m)   Wt 145 lb (65.8 kg)   SpO2 97%   BMI 27.40 kg/m    Wt Readings from Last 3 Encounters:  05/10/23 145 lb (65.8 kg)  04/29/23 144 lb 6.4 oz (65.5 kg)  04/19/23 143 lb (64.9 kg)    GEN: Well nourished, well developed in no acute distress NECK: No JVD; No carotid bruits CARDIAC: RRR, no murmurs, rubs, gallops RESPIRATORY:  Clear to auscultation without rales, wheezing or rhonchi  ABDOMEN: Soft, non-tender, non-distended EXTREMITIES:  No edema; No deformity     ASSESSMENT AND PLAN: .    Palpitations: Continues to have occasional palpitations.  These have not worsened since hospital discharge. There was no evidence of ectopy on telemetry during her admission. Palpitations do not increase with exertion. By her description, they seem to be short in duration and do not cause any additional symptoms.  We discussed possible long-term monitor, however since symptoms are not frequent, I think she can continue to monitor clinically and  contact us if symptoms worsen.  She has a lot of abdominal discomfort that seems to be related to gas/chronic constipation. Encouraged soon follow-up with GI.   Chest discomfort: She denies chest pain, shortness of breath, dyspnea with exertion.  She has some abdominal and chest discomfort when bending forward.  Prior to hospitalization, she was having chest and left arm discomfort with palpitations. That seems better. I encouraged her to increase dietary fiber to help with chronic constipation. Encouraged her to contact GI for further evaluation. Consider further ischemia testing if  chest pain worsens.   Pericardial effusion: Small circumferential pericardial effusion with no evidence of cardiac tamponade on echo 04/19/23 felt by Dr. Izora Ribas not to be contributory to symptoms.  No symptoms of worsening chest discomfort at rest or with activity. Consideration given to course of colchicine, however it would likely exacerbate GI symptoms.  I will see her back in 2 months for follow-up.  Hyperlipidemia LDL goal < 70/Aortic atherosclerosis: Aortic atherosclerosis on CTA chest 04/18/2023. LDL 111 on 04/19/23, Lp(a) is not elevated. History of myalgias on higher dose rosuvastatin.   Hypertension: BP initially elevated but improved on my recheck. She has history of whitecoat hypertension. No medication changes today.        Dispo: 2 months with me  Signed, Eligha Bridegroom, NP-C

## 2023-05-10 ENCOUNTER — Encounter: Payer: Self-pay | Admitting: Nurse Practitioner

## 2023-05-10 ENCOUNTER — Ambulatory Visit: Payer: 59 | Attending: Nurse Practitioner | Admitting: Nurse Practitioner

## 2023-05-10 VITALS — BP 122/78 | HR 73 | Ht 61.0 in | Wt 145.0 lb

## 2023-05-10 DIAGNOSIS — I3139 Other pericardial effusion (noninflammatory): Secondary | ICD-10-CM

## 2023-05-10 DIAGNOSIS — R002 Palpitations: Secondary | ICD-10-CM

## 2023-05-10 DIAGNOSIS — I700 Atherosclerosis of aorta: Secondary | ICD-10-CM | POA: Diagnosis not present

## 2023-05-10 DIAGNOSIS — R0789 Other chest pain: Secondary | ICD-10-CM

## 2023-05-10 DIAGNOSIS — E785 Hyperlipidemia, unspecified: Secondary | ICD-10-CM | POA: Diagnosis not present

## 2023-05-10 DIAGNOSIS — I10 Essential (primary) hypertension: Secondary | ICD-10-CM | POA: Diagnosis not present

## 2023-05-10 NOTE — Patient Instructions (Signed)
Medication Instructions:   Your physician recommends that you continue on your current medications as directed. Please refer to the Current Medication list given to you today.   *If you need a refill on your cardiac medications before your next appointment, please call your pharmacy*   Lab Work:  None ordered.  If you have labs (blood work) drawn today and your tests are completely normal, you will receive your results only by: MyChart Message (if you have MyChart) OR A paper copy in the mail If you have any lab test that is abnormal or we need to change your treatment, we will call you to review the results.   Testing/Procedures:  None ordered.    Follow-Up: At [facility] you and your health needs are our priority.  As part of our continuing mission to provide you with exceptional heart care, we have created designated Provider Care Teams.  These Care Teams include your primary Cardiologist (physician) and Advanced Practice Providers (APPs -  Physician Assistants and Nurse Practitioners) who all work together to provide you with the care you need, when you need it.  We recommend signing up for the patient portal called "MyChart".  Sign up information is provided on this After Visit Summary.  MyChart is used to connect with patients for Virtual Visits (Telemedicine).  Patients are able to view lab/test results, encounter notes, upcoming appointments, etc.  Non-urgent messages can be sent to your provider as well.   To learn more about what you can do with MyChart, go to [email or web address].    Your next appointment:   2 month(s)  Provider:   Eligha Bridegroom, NP         Other Instructions  High-Fiber Eating Plan Fiber, also called dietary fiber, is a type of carbohydrate. It is found foods such as fruits, vegetables, whole grains, and beans. A high-fiber diet can have many health benefits. Your health care provider may recommend a high-fiber diet to  help: Prevent constipation. Fiber can make your bowel movements more regular. Lower your cholesterol. Relieve the following conditions: Inflammation of veins in the anus (hemorrhoids). Inflammation of specific areas of the digestive tract (uncomplicated diverticulosis). A problem of the large intestine, also called the colon, that sometimes causes pain and diarrhea (irritable bowel syndrome, or IBS). Prevent overeating as part of a weight-loss plan. Prevent heart disease, type 2 diabetes, and certain cancers. What are tips for following this plan? Reading food labels  Check the nutrition facts label on food products for the amount of dietary fiber. Choose foods that have 5 grams of fiber or more per serving. The goals for recommended daily fiber intake include: Men (age 47 or younger): 34-38 g. Men (over age 79): 28-34 g. Women (age 18 or younger): 25-28 g. Women (over age 76): 22-25 g. Your daily fiber goal is _____________ g. Shopping Choose whole fruits and vegetables instead of processed forms, such as apple juice or applesauce. Choose a wide variety of high-fiber foods such as avocados, lentils, oats, and kidney beans. Read the nutrition facts label of the foods you choose. Be aware of foods with added fiber. These foods often have high sugar and sodium amounts per serving. Cooking Use whole-grain flour for baking and cooking. Cook with brown rice instead of white rice. Meal planning Start the day with a breakfast that is high in fiber, such as a cereal that contains 5 g of fiber or more per serving. Eat breads and cereals that are made with whole-grain flour  instead of refined flour or white flour. Eat brown rice, bulgur wheat, or millet instead of white rice. Use beans in place of meat in soups, salads, and pasta dishes. Be sure that half of the grains you eat each day are whole grains. General information You can get the recommended daily intake of dietary fiber by: Eating a  variety of fruits, vegetables, grains, nuts, and beans. Taking a fiber supplement if you are not able to take in enough fiber in your diet. It is better to get fiber through food than from a supplement. Gradually increase how much fiber you consume. If you increase your intake of dietary fiber too quickly, you may have bloating, cramping, or gas. Drink plenty of water to help you digest fiber. Choose high-fiber snacks, such as berries, raw vegetables, nuts, and popcorn. What foods should I eat? Fruits Berries. Pears. Apples. Oranges. Avocado. Prunes and raisins. Dried figs. Vegetables Sweet potatoes. Spinach. Kale. Artichokes. Cabbage. Broccoli. Cauliflower. Green peas. Carrots. Squash. Grains Whole-grain breads. Multigrain cereal. Oats and oatmeal. Brown rice. Barley. Bulgur wheat. Millet. Quinoa. Bran muffins. Popcorn. Rye wafer crackers. Meats and other proteins Navy beans, kidney beans, and pinto beans. Soybeans. Split peas. Lentils. Nuts and seeds. Dairy Fiber-fortified yogurt. Beverages Fiber-fortified soy milk. Fiber-fortified orange juice. Other foods Fiber bars. The items listed above may not be a complete list of recommended foods and beverages. Contact a dietitian for more information. What foods should I avoid? Fruits Fruit juice. Cooked, strained fruit. Vegetables Fried potatoes. Canned vegetables. Well-cooked vegetables. Grains White bread. Pasta made with refined flour. White rice. Meats and other proteins Fatty cuts of meat. Fried chicken or fried fish. Dairy Milk. Yogurt. Cream cheese. Sour cream. Fats and oils Butters. Beverages Soft drinks. Other foods Cakes and pastries. The items listed above may not be a complete list of foods and beverages to avoid. Talk with your dietitian about what choices are best for you. Summary Fiber is a type of carbohydrate. It is found in foods such as fruits, vegetables, whole grains, and beans. A high-fiber diet has many  benefits. It can help to prevent constipation, lower blood cholesterol, aid weight loss, and reduce your risk of heart disease, diabetes, and certain cancers. Increase your intake of fiber gradually. Increasing fiber too quickly may cause cramping, bloating, and gas. Drink plenty of water while you increase the amount of fiber you consume. The best sources of fiber include whole fruits and vegetables, whole grains, nuts, seeds, and beans. This information is not intended to replace advice given to you by your health care provider. Make sure you discuss any questions you have with your health care provider. Document Revised: 02/14/2020 Document Reviewed: 02/14/2020 Elsevier Patient Education  2024 [employer].

## 2023-05-11 DIAGNOSIS — M25562 Pain in left knee: Secondary | ICD-10-CM | POA: Diagnosis not present

## 2023-05-11 MED ORDER — METHYLPREDNISOLONE ACETATE 40 MG/ML IJ SUSP
40.0000 mg | Freq: Once | INTRAMUSCULAR | Status: AC
  Administered 2023-05-11: 40 mg via INTRA_ARTICULAR

## 2023-05-11 NOTE — Addendum Note (Signed)
Addended by: Annita Brod on: 05/11/2023 09:54 AM   Modules accepted: Orders

## 2023-05-16 ENCOUNTER — Other Ambulatory Visit: Payer: Self-pay

## 2023-05-16 ENCOUNTER — Telehealth: Payer: Self-pay | Admitting: Nurse Practitioner

## 2023-05-16 NOTE — Telephone Encounter (Signed)
Spoke with pt who reports intermittent palpitations have returned.  She denies current CP, new SOB or dizziness.  She is concerned because she is going on vacation with her family on August 3rd.  No current vital signs at this time. Pt advised Benjamine Sprague, NP is out of the office until Wednesday but will forward for her review and further recommendation.  Offered reassurance.  Reviewed ED precautions.  Pt verbalizes understanding and agrees with current plan.

## 2023-05-16 NOTE — Telephone Encounter (Signed)
Patient c/o Palpitations:  High priority if patient c/o lightheadedness, shortness of breath, or chest pain  How long have you had palpitations/irregular HR/ Afib? Are you having the symptoms now? She was in the hospital 3 or 4 weeks ago for 3 days because she thought she was having a heart attack. She has a heart murmur but she's having some palpitations again now  Are you currently experiencing lightheadedness, SOB or CP? SOB sometimes and dizziness  Do you have a history of afib (atrial fibrillation) or irregular heart rhythm? No  Have you checked your BP or HR? (document readings if available): Haven't lately   Are you experiencing any other symptoms? SOB

## 2023-05-17 ENCOUNTER — Other Ambulatory Visit: Payer: Self-pay | Admitting: Student

## 2023-05-17 MED ORDER — ROSUVASTATIN CALCIUM 5 MG PO TABS: 5.0000 mg | ORAL_TABLET | Freq: Every day | ORAL | 0 refills | Status: DC

## 2023-05-17 MED ORDER — NEBIVOLOL HCL 2.5 MG PO TABS: 2.5000 mg | ORAL_TABLET | Freq: Every day | ORAL | 6 refills | Status: DC

## 2023-05-17 NOTE — Telephone Encounter (Signed)
S/w pt this am and is willing to try bystolic one (1) tablet by mouth ( 2.5 mg) daily sent in # 30 to requested pharmacy. Will call if any concerns.

## 2023-05-17 NOTE — Telephone Encounter (Signed)
If she is still having symptoms of palpitations, we can try low dose Bystolic (nebivolol) 2.5 mg daily which is usually well tolerated.

## 2023-05-23 DIAGNOSIS — R140 Abdominal distension (gaseous): Secondary | ICD-10-CM | POA: Diagnosis not present

## 2023-05-23 DIAGNOSIS — R1013 Epigastric pain: Secondary | ICD-10-CM | POA: Diagnosis not present

## 2023-05-23 DIAGNOSIS — K5900 Constipation, unspecified: Secondary | ICD-10-CM | POA: Diagnosis not present

## 2023-05-24 ENCOUNTER — Other Ambulatory Visit: Payer: Self-pay | Admitting: Student

## 2023-05-24 DIAGNOSIS — Z1231 Encounter for screening mammogram for malignant neoplasm of breast: Secondary | ICD-10-CM

## 2023-06-06 ENCOUNTER — Other Ambulatory Visit: Payer: Self-pay | Admitting: Student

## 2023-06-16 DIAGNOSIS — D125 Benign neoplasm of sigmoid colon: Secondary | ICD-10-CM | POA: Diagnosis not present

## 2023-06-16 DIAGNOSIS — K922 Gastrointestinal hemorrhage, unspecified: Secondary | ICD-10-CM | POA: Diagnosis not present

## 2023-06-16 DIAGNOSIS — K5900 Constipation, unspecified: Secondary | ICD-10-CM | POA: Diagnosis not present

## 2023-06-16 DIAGNOSIS — D122 Benign neoplasm of ascending colon: Secondary | ICD-10-CM | POA: Diagnosis not present

## 2023-06-16 DIAGNOSIS — R1013 Epigastric pain: Secondary | ICD-10-CM | POA: Diagnosis not present

## 2023-06-16 DIAGNOSIS — D123 Benign neoplasm of transverse colon: Secondary | ICD-10-CM | POA: Diagnosis not present

## 2023-06-16 DIAGNOSIS — K2930 Chronic superficial gastritis without bleeding: Secondary | ICD-10-CM | POA: Diagnosis not present

## 2023-06-16 DIAGNOSIS — K5730 Diverticulosis of large intestine without perforation or abscess without bleeding: Secondary | ICD-10-CM | POA: Diagnosis not present

## 2023-06-16 DIAGNOSIS — K635 Polyp of colon: Secondary | ICD-10-CM | POA: Diagnosis not present

## 2023-06-22 ENCOUNTER — Other Ambulatory Visit: Payer: Self-pay | Admitting: Student

## 2023-06-23 ENCOUNTER — Ambulatory Visit: Payer: 59 | Admitting: Cardiovascular Disease

## 2023-06-29 ENCOUNTER — Ambulatory Visit
Admission: RE | Admit: 2023-06-29 | Discharge: 2023-06-29 | Disposition: A | Discharge status: Home / Self Care | Payer: 59 | Source: Ambulatory Visit | Attending: Family Medicine | Admitting: Family Medicine

## 2023-06-29 DIAGNOSIS — Z1231 Encounter for screening mammogram for malignant neoplasm of breast: Secondary | ICD-10-CM | POA: Diagnosis not present

## 2023-07-09 NOTE — Progress Notes (Unsigned)
Cardiology Office Note:  .   Date:  07/11/2023  ID:  Diana Roach, DOB [birth year], MRN [id number] PCP: Alicia Amel, MD  [facility] Providers Cardiologist:  Christell Constant, MD    Patient Profile: .      PMH Hypertension Whitecoat hypertension Hyperlipidemia Type 2 DM GERD  Evaluated for palpitations in 2022.  She wore a cardiac monitor with no concerning arrhythmias.  Echo at that time showed LVEF 60 to 65% with trivial AR.    Seen by family medicine on 04/13/2023 for palpitations x 2 weeks.  She was not having any chest pain or syncope but was experiencing dizziness and fatigue.  Outpatient cardiology was consulted to but she had not yet been to appointment.  Admission 6/24-6/25/24 with complaints of epigastric/lower chest pain that radiates to her back for 2 days.  She was given a GI cocktail in the ED and her symptoms improved.  Labs were unremarkable including negative troponin.  EKG was normal sinus rhythm with no acute changes.  Cardiology was consulted.  She reported palpitations and chest pain did not occur together.  She had no electrical abnormalities on telemetry.  Echo 04/19/2023 revealed LVEF 50 to 55%, no RWMA, indeterminate diastolic parameters, small circumferential pericardial effusion with no evidence of cardiac tamponade, trivial MR and trivial AI. Recommendation to repeat echo in 3 years. Lipoprotein a is 58.1, LDL 111, hemoglobin A1C 7.0. Advised to start rosuvastatin 5 mg daily.   Seen in clinic by me on 05/10/23 for hospital follow-up. She reports she continues to have symptoms of heart fluttering but no chest or arm discomfort. Symptoms prior to hospitalization were worse than symptoms while hospitalized. Has discomfort when she bends over to pet her dog. History of chronic constipation for which she takes Miralax and fiber supplement.  She has resumed going to the gym 3 days/week for walking and weightlifting.  She is not having any  significant symptoms with exercise.  No increase in palpitations. No orthopnea, PND, presyncope, syncope. We discussed high fiber, high protein diet to help ease her GI symptoms.        History of Present Illness: .   Diana Roach is a very pleasant 72 y.o. female who is here today for follow-up. Reports she is feeling well. Has shortness of breath that she feels is chronic and secondary to asthma. Occasionally " I lose my breath." Does not feel that symptoms have worsened recently and denies dyspnea, orthopnea, PND, and edema. Has not had any palpitations since she picked up Bystolic Rx, so she has never taken it. She is helping care for her great grandson so she has not been exercising as much recently, has to drive 45 minutes each way daily to pick him up and take him home. She denies chest pain, presyncope, syncope. Previously monitored BP at home consistently, but has not done so recently.   ROS: See HPI       Studies Reviewed: .         Risk Assessment/Calculations:     HYPERTENSION CONTROL Vitals:   07/11/23 0953 07/11/23 1101  BP: (!) 146/88 (!) 140/78    The patient's blood pressure is elevated above target today.  In order to address the patient's elevated BP: Blood pressure will be monitored at home to determine if medication changes need to be made.; The blood pressure is usually elevated in clinic.  Blood pressures monitored at home have been optimal.  Physical Exam:   VS:  BP (!) 140/78   Pulse 75   Ht 5\' 1"  (1.549 m)   Wt 146 lb 6.4 oz (66.4 kg)   SpO2 95%   BMI 27.66 kg/m    Wt Readings from Last 3 Encounters:  07/11/23 146 lb 6.4 oz (66.4 kg)  05/10/23 145 lb (65.8 kg)  04/29/23 144 lb 6.4 oz (65.5 kg)    GEN: Well nourished, well developed in no acute distress NECK: No JVD; No carotid bruits CARDIAC: RRR, no murmurs, rubs, gallops RESPIRATORY:  Clear to auscultation without rales, wheezing or rhonchi  ABDOMEN: Soft, non-tender,  non-distended EXTREMITIES:  No edema; No deformity     ASSESSMENT AND PLAN: .    Palpitations: Quiescent at this time.  Was prescribed Bystolic which she picked up but has not taken it. Advised her to take Bystolic as needed for palpitations.   Chest discomfort: She denies chest pain or dyspnea. Has some shortness of breath at times but she feels this is secondary to asthma. No discomfort like prior to hospitalization. Is being worked out by GI for possible bleeding ulcer.    Pericardial effusion: Small circumferential pericardial effusion with no evidence of cardiac tamponade on echo 04/19/23 felt by Dr. Izora Ribas not to be contributory to symptoms. She is feeling well with no chest pain and only mild shortness of breath that she feels is secondary to asthma. No discomfort when laying down.   Hyperlipidemia LDL goal < 70/Aortic atherosclerosis: LDL 111 on 04/19/23, Lp(a) is not elevated. History of myalgias on higher dose rosuvastatin. Emphasized the importance of LDL goal < 70.  Admits non-compliance with daily rosuvastatin just forgetting to take it. She will make an effort to take rosuvastatin daily and we will recheck lipid panel in 2 months to ensure improvement.   White coat hypertension: BP is elevated, slightly improved on my recheck. Home BP has been well controlled although she has not been checking as consistently recently.  Advised her to monitor home BP and report to PCP if elevated.       Dispo: 1 year with Dr. Izora Ribas  Signed, Eligha Bridegroom, NP-C

## 2023-07-11 ENCOUNTER — Encounter: Payer: Self-pay | Admitting: Nurse Practitioner

## 2023-07-11 ENCOUNTER — Ambulatory Visit: Payer: 59 | Attending: Nurse Practitioner | Admitting: Nurse Practitioner

## 2023-07-11 VITALS — BP 140/78 | HR 75 | Ht 61.0 in | Wt 146.4 lb

## 2023-07-11 DIAGNOSIS — I3139 Other pericardial effusion (noninflammatory): Secondary | ICD-10-CM

## 2023-07-11 DIAGNOSIS — R002 Palpitations: Secondary | ICD-10-CM | POA: Diagnosis not present

## 2023-07-11 DIAGNOSIS — E785 Hyperlipidemia, unspecified: Secondary | ICD-10-CM | POA: Diagnosis not present

## 2023-07-11 DIAGNOSIS — I700 Atherosclerosis of aorta: Secondary | ICD-10-CM

## 2023-07-11 DIAGNOSIS — R0789 Other chest pain: Secondary | ICD-10-CM | POA: Diagnosis not present

## 2023-07-11 DIAGNOSIS — R030 Elevated blood-pressure reading, without diagnosis of hypertension: Secondary | ICD-10-CM | POA: Diagnosis not present

## 2023-07-11 MED ORDER — NEBIVOLOL HCL 2.5 MG PO TABS: 2.5000 mg | ORAL_TABLET | Freq: Every day | ORAL | 6 refills | Status: DC | PRN

## 2023-07-11 NOTE — Patient Instructions (Signed)
Medication Instructions:   Your physician recommends that you continue on your current medications as directed. Please refer to the Current Medication list given to you today.   *If you need a refill on your cardiac medications before your next appointment, please call your pharmacy*   Lab Work:  Your physician recommends that you return for a FASTING lipid profile/cmet on Wednesday, November 13. You can come in on the day of your appointment anytime between 7:30-4:30 fasting from midnight the night before.   If you have labs (blood work) drawn today and your tests are completely normal, you will receive your results only by: MyChart Message (if you have MyChart) OR A paper copy in the mail If you have any lab test that is abnormal or we need to change your treatment, we will call you to review the results.   Testing/Procedures:  None ordered.   Follow-Up: At [facility], you and your health needs are our priority.  As part of our continuing mission to provide you with exceptional heart care, we have created designated Provider Care Teams.  These Care Teams include your primary Cardiologist (physician) and Advanced Practice Providers (APPs -  Physician Assistants and Nurse Practitioners) who all work together to provide you with the care you need, when you need it.  We recommend signing up for the patient portal called "MyChart".  Sign up information is provided on this After Visit Summary.  MyChart is used to connect with patients for Virtual Visits (Telemedicine).  Patients are able to view lab/test results, encounter notes, upcoming appointments, etc.  Non-urgent messages can be sent to your provider as well.   To learn more about what you can do with MyChart, go to [email or web address].    Your next appointment:   1 year(s)  Provider:   Christell Constant, MD     Other Instructions  Your physician wants you to follow-up in: 1 year.  You will receive a  reminder letter in the mail two months in advance. If you don't receive a letter, please call our office to schedule the follow-up appointment.

## 2023-07-17 ENCOUNTER — Other Ambulatory Visit: Payer: Self-pay | Admitting: Student

## 2023-07-18 DIAGNOSIS — R140 Abdominal distension (gaseous): Secondary | ICD-10-CM | POA: Diagnosis not present

## 2023-07-18 DIAGNOSIS — K5900 Constipation, unspecified: Secondary | ICD-10-CM | POA: Diagnosis not present

## 2023-07-28 ENCOUNTER — Encounter: Payer: Self-pay | Admitting: Student

## 2023-07-28 ENCOUNTER — Ambulatory Visit: Payer: 59 | Admitting: Student

## 2023-07-28 VITALS — BP 126/83 | HR 69 | Ht 61.0 in | Wt 147.6 lb

## 2023-07-28 DIAGNOSIS — R040 Epistaxis: Secondary | ICD-10-CM

## 2023-07-28 DIAGNOSIS — Z23 Encounter for immunization: Secondary | ICD-10-CM

## 2023-07-28 NOTE — Patient Instructions (Addendum)
I want you to buy some Afrin (oxymetazoline is the generic name) from the drug store and keep this at home in your nosebleed "kit."  If you have a bleed:  1) Blow your nose to get clots out 2) Two sprays of Afrin per nostril 3) Pressure x10 minutes   Go to the pharmacy to get your shingles vaccine when you're picking up your Afrin.   Call your ENT back for a visit because it looks like you have a small polyp. [facility] Address: [address] [place], [place], [place] [address] Phone: [phone]   J Dorothyann Gibbs, MD

## 2023-07-29 DIAGNOSIS — R040 Epistaxis: Secondary | ICD-10-CM | POA: Insufficient documentation

## 2023-07-29 NOTE — Assessment & Plan Note (Addendum)
Now a recurrent issue. Appears to have a nasal polyp on the left. I am glad she is using a humidifier at home. Reviewed epistaxis care and pointed her to Afrin as an option for difficult to control bleeding.  - Encouraged her to reach out to ENT for an appointment, referral should still be active.

## 2023-07-29 NOTE — Progress Notes (Signed)
    SUBJECTIVE:   CHIEF COMPLAINT / HPI:   Epistaxis Seen by me for the same back in February. Symptoms had actually resolved, but then she had recurrent bleeding on Sunday of this week. No big changes, no recent intranasal medications. Thinks maybe its related to the humidity in her home because a relative also had a nosebleed in her house? Started using a CoolMist humidifier.  Had been referred to ENT but deferred this visit as she had gone so long without any symptoms.    OBJECTIVE:   BP 126/83   Pulse 69   Ht 5\' 1"  (1.549 m)   Wt 147 lb 9.6 oz (67 kg)   SpO2 94%   BMI 27.89 kg/m   Gen: Well-appearing, age appropriate and NAD Nose: Bilateral nares are patent, there is a small, fleshy projection along the anterior septum on the left. Cardio: RRR, without M/R/G Pulm: Normal WOB on RA, lungs are clear  MSK: Without edema or deformity   ASSESSMENT/PLAN:   Epistaxis Now a recurrent issue. Appears to have a nasal polyp on the left. I am glad she is using a humidifier at home. Reviewed epistaxis care and pointed her to Afrin as an option for difficult to control bleeding.  - Encouraged her to reach out to ENT for an appointment, referral should still be active.    Healthcare Maintenance - Flu today - Plans to get Shingles Vaccine at community pharmacy   J Dorothyann Gibbs, MD [facility]

## 2023-08-15 DIAGNOSIS — K5900 Constipation, unspecified: Secondary | ICD-10-CM | POA: Diagnosis not present

## 2023-09-02 ENCOUNTER — Encounter: Payer: Self-pay | Admitting: Student

## 2023-09-02 ENCOUNTER — Ambulatory Visit (INDEPENDENT_AMBULATORY_CARE_PROVIDER_SITE_OTHER): Payer: 59 | Admitting: Student

## 2023-09-02 VITALS — BP 112/80 | HR 87 | Wt 143.0 lb

## 2023-09-02 DIAGNOSIS — J302 Other seasonal allergic rhinitis: Secondary | ICD-10-CM | POA: Diagnosis not present

## 2023-09-02 DIAGNOSIS — B349 Viral infection, unspecified: Secondary | ICD-10-CM

## 2023-09-02 MED ORDER — LORATADINE 10 MG PO TABS
10.0000 mg | ORAL_TABLET | Freq: Every day | ORAL | 3 refills | Status: AC
Start: 2023-09-02 — End: ?

## 2023-09-02 NOTE — Patient Instructions (Signed)
I think you've got a virus. You can thank your family for this. Reassuringly, your lungs are clear and your vital signs are all normal. I dont think we need to jump to any medications or imaging right now.  IF you start coughing up green stuff, having a hard time breathing, or start having fevers NOW, please MyChart/call me.   [name], MD

## 2023-09-02 NOTE — Progress Notes (Signed)
    SUBJECTIVE:   CHIEF COMPLAINT / HPI:   Viral Syndrome Here with cough, nasal congestion, body aches and elevated temps to the 99's for the past 8 days.  Pretty much everyone in her extended family has been sick with similar symptoms, she thinks she got sick from her great grandchild.  Cough is productive of clear to white scant sputum, no significant sputum production or green coloration to anything.  No chest pain or difficulty breathing.  Eating and drinking, otherwise feeling like herself.  She also thinks that her seasonal allergies may be contributing to cure, she previously was on daily loratadine but has not been taking this since a recent hospitalization.  She is not sure why it was stopped while she was in the hospital.   OBJECTIVE:   BP 112/80   Pulse 87   Wt 143 lb (64.9 kg)   SpO2 98%   BMI 27.02 kg/m   General: Quite well-appearing, in good spirits HEENT: Oropharynx is clear, without erythema, exudate, or tonsillar hypertrophy.  There is no tenderness to palpation of the frontal or maxillary sinuses. Neck: Without cervical lymphadenopathy Cardio: Regular rate and rhythm Pulm: Normal work of breathing on room air, lungs are clear to auscultation in all fields with good air movement  ASSESSMENT/PLAN:   Viral syndrome Relatively mild symptoms, it is a bit unusual that she has had symptoms for 8 days, I am hopeful that she is on the tail end of this and should be improving rather than getting worse over the next few days.  Discussed with her that while I do not see an indication for antibiotic therapy at this time, should she worsen, start to develop evidence of pneumonia or sinusitis, she will need to just call personal MyChart message and I would consider antibiotics at that time.  Otherwise, she has a benign exam today without respiratory distress or evidence of acute bacterial infection.     [name], MD [facility]

## 2023-09-02 NOTE — Assessment & Plan Note (Addendum)
Relatively mild symptoms, it is a bit unusual that she has had symptoms for 8 days, I am hopeful that she is on the tail end of this and should be improving rather than getting worse over the next few days.  Discussed with her that while I do not see an indication for antibiotic therapy at this time, should she worsen, start to develop evidence of pneumonia or sinusitis, she will need to just call personal MyChart message and I would consider antibiotics at that time.  Otherwise, she has a benign exam today without respiratory distress or evidence of acute bacterial infection.

## 2023-09-05 DIAGNOSIS — R040 Epistaxis: Secondary | ICD-10-CM | POA: Diagnosis not present

## 2023-09-05 DIAGNOSIS — J3489 Other specified disorders of nose and nasal sinuses: Secondary | ICD-10-CM | POA: Diagnosis not present

## 2023-09-05 DIAGNOSIS — J342 Deviated nasal septum: Secondary | ICD-10-CM | POA: Diagnosis not present

## 2023-09-06 ENCOUNTER — Encounter: Payer: Self-pay | Admitting: Family Medicine

## 2023-09-06 ENCOUNTER — Ambulatory Visit
Admission: RE | Admit: 2023-09-06 | Discharge: 2023-09-06 | Disposition: A | Discharge status: Home / Self Care | Payer: 59 | Source: Ambulatory Visit | Attending: Family Medicine | Admitting: Family Medicine

## 2023-09-06 ENCOUNTER — Ambulatory Visit (INDEPENDENT_AMBULATORY_CARE_PROVIDER_SITE_OTHER): Payer: 59 | Admitting: Student

## 2023-09-06 VITALS — BP 160/90 | HR 95 | Ht 61.0 in | Wt 147.4 lb

## 2023-09-06 DIAGNOSIS — J159 Unspecified bacterial pneumonia: Secondary | ICD-10-CM

## 2023-09-06 MED ORDER — AZITHROMYCIN 250 MG PO TABS
ORAL_TABLET | ORAL | 0 refills | Status: DC
Start: 2023-09-06 — End: 2023-09-28

## 2023-09-06 MED ORDER — CEFDINIR 300 MG PO CAPS
300.0000 mg | ORAL_CAPSULE | Freq: Two times a day (BID) | ORAL | 0 refills | Status: DC
Start: 2023-09-06 — End: 2023-09-28

## 2023-09-06 NOTE — Progress Notes (Unsigned)
    SUBJECTIVE:   CHIEF COMPLAINT / HPI:   Cough, now productive Ms. Stoiber was seen by me on 11/8 for about 8 days of cough and cold symptoms.  Pretty much everyone at home has been sick with similar symptoms.  Unfortunately she returns today because while everyone else at home has been improving, she is getting worse rather than better.  Over the weekend she was feeling a bit feverish and had some chills and also noticed that her cough is increased and is now productive of dark green sputum.  PERTINENT  PMH / PSH: ***  OBJECTIVE:   BP (!) 160/90   Pulse 95   Ht 5\' 1"  (1.549 m)   Wt 147 lb 6.4 oz (66.9 kg)   SpO2 98%   BMI 27.85 kg/m   ***  ASSESSMENT/PLAN:   No problem-specific Assessment & Plan notes found for this encounter.     [name], MD [facility]

## 2023-09-06 NOTE — Patient Instructions (Signed)
Diana Roach,   I think you've got a pneumonia. Please go to [facility] at [employer] to get your X-Ray done. They are open 7:30a-5p Monday-Friday. You do not need an appointment to get this done.  We will treat this with two different antibiotics: cefdinir and azithromycin. The cefdinir is 1 tablet twice daily. The azithromycin is 2 tablets on day 1 followed by 1 tablet daily x4 days.  I'm going to go ahead and schedule you to come back, cancel this if feeling better.  [name], MD

## 2023-09-07 ENCOUNTER — Ambulatory Visit: Payer: 59

## 2023-09-07 DIAGNOSIS — J159 Unspecified bacterial pneumonia: Secondary | ICD-10-CM | POA: Insufficient documentation

## 2023-09-07 DIAGNOSIS — E785 Hyperlipidemia, unspecified: Secondary | ICD-10-CM

## 2023-09-07 DIAGNOSIS — I700 Atherosclerosis of aorta: Secondary | ICD-10-CM

## 2023-09-07 HISTORY — DX: Unspecified bacterial pneumonia: J15.9

## 2023-09-07 NOTE — Assessment & Plan Note (Signed)
Duration of illness, new fever and productive cough all point to a bacterial PNA superimposed on the viral illness she had last week.  - Will treat with cefdinir and azithromycin (want atypical coverage given high rates of M pneumoniae in the community) - CXR ordered  - F/u in three days, advised that she can cancel this appointment if feeling better

## 2023-09-09 ENCOUNTER — Ambulatory Visit (INDEPENDENT_AMBULATORY_CARE_PROVIDER_SITE_OTHER): Payer: 59 | Admitting: Student

## 2023-09-09 ENCOUNTER — Encounter: Payer: Self-pay | Admitting: Student

## 2023-09-09 VITALS — BP 128/88 | HR 96 | Ht 61.0 in | Wt 146.0 lb

## 2023-09-09 DIAGNOSIS — J159 Unspecified bacterial pneumonia: Secondary | ICD-10-CM | POA: Diagnosis not present

## 2023-09-09 DIAGNOSIS — E1140 Type 2 diabetes mellitus with diabetic neuropathy, unspecified: Secondary | ICD-10-CM

## 2023-09-09 LAB — POCT GLYCOSYLATED HEMOGLOBIN (HGB A1C): HbA1c, POC (controlled diabetic range): 7 % (ref 0.0–7.0)

## 2023-09-09 MED ORDER — BENZONATATE 100 MG PO CAPS
100.0000 mg | ORAL_CAPSULE | Freq: Two times a day (BID) | ORAL | 0 refills | Status: DC | PRN
Start: 2023-09-09 — End: 2024-02-15

## 2023-09-09 NOTE — Progress Notes (Unsigned)
    SUBJECTIVE:   CHIEF COMPLAINT / HPI:   PNA Follow-up Seen by me earlier this week (11/12) for PNA and was treated with cefdinir and azithromycin.  She comes back today due to persistent shortness of breath and cough.  She does tell me that her sputum seems to be clearing, it is more clear and less green than it has earlier in the week.  She has not had any further fevers.  She is frustrated that all of her family members who are also sick have all gotten better but she does not seem to be getting any better.  OBJECTIVE:   BP 128/88   Pulse 96   Ht 5\' 1"  (1.549 m)   Wt 146 lb (66.2 kg)   SpO2 94%   BMI 27.59 kg/m   General: Ill-appearing but nontoxic HEENT: Mucous membranes are moist, oropharynx is clear Cardio: Normal rate and rhythm, no murmurs Pulm: Speaking in 5-6 word phrases, lung sounds are coarse throughout without foci of crackles or diminished lung sounds  ASSESSMENT/PLAN:   Community acquired bacterial pneumonia Reviewed the chest x-ray that was obtained earlier this week, does not show any focal consolidations.  I continue to believe that bacterial pneumonia is the most likely explanation for her symptoms and believe that she is on appropriate therapy.  Given how bothersome her cough is, I will give her some benzonatate to help with symptom control and have her continue her antibiotics as prescribed.  Will schedule her for follow-up early next week in case she fails to improve. -Continue cefdinir 300 mg twice daily -Continue azithromycin 250 mg daily -Benzonatate 100 mg capsules twice daily as needed -Advised to schedule albuterol every 4 hours for the next 48 hours -Follow-up as scheduled on 11/19  Type 2 diabetes mellitus with diabetic neuropathy, without long-term current use of insulin (HCC) A1c today remains 7.0% off of all medications.  Congratulated on continued success.  Advised her that continuing her habit of going to [employer] is key in keeping this in  check.  She remains on a statin. -Continue diet and exercise interventions -Continue rosuvastatin 5 mg daily     J Dorothyann Gibbs, MD [facility]

## 2023-09-09 NOTE — Patient Instructions (Addendum)
I want you to try using your albuterol every four hours for the next few days.   Based on everything we've seen, I think I've got you on the right medicine. I expect this to get better over the next few days. I have scheduled you back on Tuesday. If you are feeling better, please cancel this appt.   I am sending in some Tessalon Perles to help with the coughing.  [name], MD

## 2023-09-11 NOTE — Assessment & Plan Note (Signed)
A1c today remains 7.0% off of all medications.  Congratulated on continued success.  Advised her that continuing her habit of going to [employer] is key in keeping this in check.  She remains on a statin. -Continue diet and exercise interventions -Continue rosuvastatin 5 mg daily

## 2023-09-11 NOTE — Assessment & Plan Note (Addendum)
Reviewed the chest x-ray that was obtained earlier this week, does not show any focal consolidations.  I continue to believe that bacterial pneumonia is the most likely explanation for her symptoms and believe that she is on appropriate therapy.  Given how bothersome her cough is, I will give her some benzonatate to help with symptom control and have her continue her antibiotics as prescribed.  Will schedule her for follow-up early next week in case she fails to improve. -Continue cefdinir 300 mg twice daily -Continue azithromycin 250 mg daily -Benzonatate 100 mg capsules twice daily as needed -Advised to schedule albuterol every 4 hours for the next 48 hours -Follow-up as scheduled on 11/19

## 2023-09-13 ENCOUNTER — Ambulatory Visit: Payer: Self-pay | Admitting: Family Medicine

## 2023-09-16 ENCOUNTER — Ambulatory Visit: Payer: 59 | Attending: Nurse Practitioner

## 2023-09-16 DIAGNOSIS — I700 Atherosclerosis of aorta: Secondary | ICD-10-CM | POA: Diagnosis not present

## 2023-09-16 DIAGNOSIS — E785 Hyperlipidemia, unspecified: Secondary | ICD-10-CM | POA: Diagnosis not present

## 2023-09-16 LAB — LIPID PANEL
Chol/HDL Ratio: 4.6 ratio — ABNORMAL HIGH (ref 0.0–4.4)
Cholesterol, Total: 231 mg/dL — ABNORMAL HIGH (ref 100–199)
HDL: 50 mg/dL (ref >39)
LDL Chol Calc (NIH): 162 mg/dL — ABNORMAL HIGH (ref 0–99)
Triglycerides: 109 mg/dL (ref 0–149)
VLDL Cholesterol Cal: 19 mg/dL (ref 5–40)

## 2023-09-16 LAB — COMPREHENSIVE METABOLIC PANEL
ALT: 13 [IU]/L (ref 0–32)
AST: 18 [IU]/L (ref 0–40)
Albumin: 4.5 g/dL (ref 3.8–4.8)
Alkaline Phosphatase: 86 [IU]/L (ref 44–121)
BUN/Creatinine Ratio: 10 — ABNORMAL LOW (ref 12–28)
BUN: 12 mg/dL (ref 8–27)
Bilirubin Total: 0.3 mg/dL (ref 0.0–1.2)
CO2: 23 mmol/L (ref 20–29)
Calcium: 9.6 mg/dL (ref 8.7–10.3)
Chloride: 103 mmol/L (ref 96–106)
Creatinine, Ser: 1.24 mg/dL — ABNORMAL HIGH (ref 0.57–1.00)
Globulin, Total: 2.9 g/dL (ref 1.5–4.5)
Glucose: 122 mg/dL — ABNORMAL HIGH (ref 70–99)
Potassium: 4.7 mmol/L (ref 3.5–5.2)
Sodium: 139 mmol/L (ref 134–144)
Total Protein: 7.4 g/dL (ref 6.0–8.5)
eGFR: 46 mL/min/{1.73_m2} — ABNORMAL LOW (ref >59)

## 2023-09-19 ENCOUNTER — Telehealth: Payer: Self-pay | Admitting: *Deleted

## 2023-09-19 NOTE — Telephone Encounter (Signed)
Error

## 2023-09-20 ENCOUNTER — Other Ambulatory Visit: Payer: Self-pay | Admitting: *Deleted

## 2023-09-20 DIAGNOSIS — E785 Hyperlipidemia, unspecified: Secondary | ICD-10-CM

## 2023-09-20 DIAGNOSIS — I700 Atherosclerosis of aorta: Secondary | ICD-10-CM

## 2023-09-20 MED ORDER — ROSUVASTATIN CALCIUM 5 MG PO TABS: 5.0000 mg | ORAL_TABLET | Freq: Every day | ORAL | Status: DC

## 2023-09-25 ENCOUNTER — Emergency Department (HOSPITAL_COMMUNITY)
Admission: EM | Admit: 2023-09-25 | Discharge: 2023-09-25 | Disposition: A | Discharge status: Home / Self Care | Payer: 59

## 2023-09-25 ENCOUNTER — Other Ambulatory Visit: Payer: Self-pay

## 2023-09-25 ENCOUNTER — Emergency Department (HOSPITAL_COMMUNITY): Payer: 59

## 2023-09-25 ENCOUNTER — Encounter (HOSPITAL_COMMUNITY): Payer: Self-pay

## 2023-09-25 DIAGNOSIS — Y92410 Unspecified street and highway as the place of occurrence of the external cause: Secondary | ICD-10-CM | POA: Insufficient documentation

## 2023-09-25 DIAGNOSIS — M25562 Pain in left knee: Secondary | ICD-10-CM | POA: Diagnosis not present

## 2023-09-25 DIAGNOSIS — R0789 Other chest pain: Secondary | ICD-10-CM | POA: Insufficient documentation

## 2023-09-25 DIAGNOSIS — R079 Chest pain, unspecified: Secondary | ICD-10-CM | POA: Diagnosis not present

## 2023-09-25 DIAGNOSIS — E119 Type 2 diabetes mellitus without complications: Secondary | ICD-10-CM | POA: Diagnosis not present

## 2023-09-25 DIAGNOSIS — J45909 Unspecified asthma, uncomplicated: Secondary | ICD-10-CM | POA: Insufficient documentation

## 2023-09-25 MED ORDER — HYDROCODONE-ACETAMINOPHEN 5-325 MG PO TABS: 1.0000 | ORAL_TABLET | Freq: Four times a day (QID) | ORAL | 0 refills | Status: DC | PRN

## 2023-09-25 MED ORDER — IBUPROFEN 400 MG PO TABS
600.0000 mg | ORAL_TABLET | Freq: Once | ORAL | Status: AC
  Administered 2023-09-25: 600 mg via ORAL
  Filled 2023-09-25: qty 1

## 2023-09-25 MED ORDER — HYDROCODONE-ACETAMINOPHEN 5-325 MG PO TABS
1.0000 | ORAL_TABLET | Freq: Once | ORAL | Status: AC
  Administered 2023-09-25: 1 via ORAL
  Filled 2023-09-25: qty 1
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
## 2023-09-28 ENCOUNTER — Ambulatory Visit (INDEPENDENT_AMBULATORY_CARE_PROVIDER_SITE_OTHER): Payer: 59 | Admitting: Family Medicine

## 2023-09-28 ENCOUNTER — Encounter: Payer: Self-pay | Admitting: Family Medicine

## 2023-09-28 VITALS — BP 159/80 | HR 59 | Ht 61.0 in | Wt 147.2 lb

## 2023-09-28 DIAGNOSIS — M79644 Pain in right finger(s): Secondary | ICD-10-CM

## 2023-09-28 DIAGNOSIS — M25562 Pain in left knee: Secondary | ICD-10-CM | POA: Diagnosis not present

## 2023-09-28 DIAGNOSIS — T148XXA Other injury of unspecified body region, initial encounter: Secondary | ICD-10-CM

## 2023-09-28 DIAGNOSIS — R030 Elevated blood-pressure reading, without diagnosis of hypertension: Secondary | ICD-10-CM | POA: Diagnosis not present

## 2023-09-28 DIAGNOSIS — R0789 Other chest pain: Secondary | ICD-10-CM | POA: Diagnosis not present

## 2023-09-28 MED ORDER — BACLOFEN 5 MG PO TABS: 5.0000 mg | ORAL_TABLET | Freq: Two times a day (BID) | ORAL | 0 refills | Status: AC | PRN
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
## 2023-09-28 NOTE — Patient Instructions (Signed)
Contusion A contusion is a deep bruise. Contusions are the result of a blunt injury to tissues and muscle fibers under the skin. The injury causes bleeding under the skin. The skin over the contusion may turn blue, purple, or yellow. Minor injuries will give you a painless contusion, but more severe injuries cause contusions that can stay painful and swollen for a few weeks. Follow these instructions at home: Pay attention to any changes in your symptoms. Let your health care provider know about them. Take these actions to relieve your pain. Managing pain, stiffness, and swelling  Use resting, icing, applying pressure (compression), and raising (elevating) the injured area. This is often called the RICE method. Rest the injured area. Return to your normal activities as told by your health care provider. Ask your health care provider what activities are safe for you. If directed, put ice on the injured area. To do this: Put ice in a plastic bag. Place a towel between your skin and the bag. Leave the ice on for 20 minutes, 2-3 times a day. If your skin turns bright red, remove the ice right away to prevent skin damage. The risk of skin damage is higher if you cannot feel pain, heat, or cold. If directed, apply light compression to the injured area using an elastic bandage. Make sure the bandage is not wrapped too tightly. Remove and reapply the bandage as directed by your health care provider. If possible, elevate the injured area above the level of your heart while you are sitting or lying down. General instructions Take over-the-counter and prescription medicines only as told by your health care provider. Keep all follow-up visits. Your health care provider may want to see how your contusion is healing with treatment. Contact a health care provider if: Your symptoms do not improve after several days of treatment. Your symptoms get worse. You have difficulty moving the injured area. Get help  right away if: You have severe pain. You have numbness in a hand or foot. Your hand or foot turns pale or cold. This information is not intended to replace advice given to you by your health care provider. Make sure you discuss any questions you have with your health care provider. Document Revised: 03/29/2022 Document Reviewed: 03/29/2022 Elsevier Patient Education  2024 [employer].

## 2023-10-05 ENCOUNTER — Ambulatory Visit: Payer: 59 | Admitting: Student

## 2023-10-10 ENCOUNTER — Ambulatory Visit: Payer: Self-pay | Admitting: Student

## 2023-10-12 ENCOUNTER — Other Ambulatory Visit: Payer: Self-pay | Admitting: Student

## 2023-10-17 ENCOUNTER — Ambulatory Visit (INDEPENDENT_AMBULATORY_CARE_PROVIDER_SITE_OTHER): Payer: 59 | Admitting: Student

## 2023-10-17 ENCOUNTER — Encounter: Payer: Self-pay | Admitting: Student

## 2023-10-17 VITALS — BP 164/85 | HR 74 | Ht 61.0 in | Wt 149.0 lb

## 2023-10-17 DIAGNOSIS — R030 Elevated blood-pressure reading, without diagnosis of hypertension: Secondary | ICD-10-CM | POA: Diagnosis not present

## 2023-10-17 DIAGNOSIS — M25551 Pain in right hip: Secondary | ICD-10-CM

## 2023-10-17 NOTE — Assessment & Plan Note (Signed)
Elevated x2 today. But has had ambulatory BP monitoring with Dr. Raymondo Band which was normal and home readings have been WNL. Therefore do not see a role for antihypertensives at this time.
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
## 2023-10-17 NOTE — Patient Instructions (Addendum)
Ms. Diana Roach, Pezza to see you!  I think your chest wall pain should continue to get better with the tincture of time. I am going to let your team at [facility] take the lead on your hip. I suspect this is either 1) an impingement syndrome or 2) a labral tear. Either way, this will likely be treated with physical therapy =/- an injection.  Your lungs sound MUCH better today.  Glad to hear you are motivated to get back in the gym, this is the best thing you can do for yourself.  [name], MD

## 2023-11-02 DIAGNOSIS — M25511 Pain in right shoulder: Secondary | ICD-10-CM | POA: Diagnosis not present

## 2023-11-02 DIAGNOSIS — M25512 Pain in left shoulder: Secondary | ICD-10-CM | POA: Diagnosis not present

## 2023-11-09 ENCOUNTER — Other Ambulatory Visit: Payer: Self-pay | Admitting: Student

## 2023-11-24 DIAGNOSIS — E785 Hyperlipidemia, unspecified: Secondary | ICD-10-CM | POA: Diagnosis not present

## 2023-11-24 DIAGNOSIS — I700 Atherosclerosis of aorta: Secondary | ICD-10-CM | POA: Diagnosis not present

## 2023-11-24 LAB — LIPID PANEL
Chol/HDL Ratio: 5.1 {ratio} — ABNORMAL HIGH (ref 0.0–4.4)
Cholesterol, Total: 225 mg/dL — ABNORMAL HIGH (ref 100–199)
HDL: 44 mg/dL (ref >39)
LDL Chol Calc (NIH): 128 mg/dL — ABNORMAL HIGH (ref 0–99)
Triglycerides: 298 mg/dL — ABNORMAL HIGH (ref 0–149)
VLDL Cholesterol Cal: 53 mg/dL — ABNORMAL HIGH (ref 5–40)

## 2023-11-24 LAB — ALT: ALT: 17 [IU]/L (ref 0–32)

## 2023-12-21 DIAGNOSIS — K5904 Chronic idiopathic constipation: Secondary | ICD-10-CM | POA: Diagnosis not present

## 2023-12-21 DIAGNOSIS — R1013 Epigastric pain: Secondary | ICD-10-CM | POA: Diagnosis not present

## 2023-12-22 ENCOUNTER — Ambulatory Visit (HOSPITAL_COMMUNITY)
Admission: RE | Admit: 2023-12-22 | Discharge: 2023-12-22 | Disposition: A | Discharge status: Home / Self Care | Payer: 59 | Source: Ambulatory Visit | Attending: Family Medicine | Admitting: Family Medicine

## 2023-12-22 ENCOUNTER — Encounter: Payer: Self-pay | Admitting: Student

## 2023-12-22 ENCOUNTER — Ambulatory Visit (INDEPENDENT_AMBULATORY_CARE_PROVIDER_SITE_OTHER): Payer: 59 | Admitting: Student

## 2023-12-22 VITALS — BP 126/72 | HR 94 | Ht 61.0 in | Wt 146.0 lb

## 2023-12-22 DIAGNOSIS — Z01818 Encounter for other preprocedural examination: Secondary | ICD-10-CM | POA: Insufficient documentation

## 2023-12-22 DIAGNOSIS — R1013 Epigastric pain: Secondary | ICD-10-CM

## 2023-12-22 NOTE — Patient Instructions (Addendum)
 Ms. Zook,  Always great to see you!  I want you to be sure that you are taking your Linzess and Protonix EVERY day. If this doesn't start improving quickly, I would want you to increase your protonix to TWICE daily and get back in to see Dr. Elnoria Howard ASAP. To follow-up on some of your heart stuff from last year, I am ordering a CTA of your coronary arteries that we will call you to schedule.  I am also going to order a repeat echocardiogram just to make sure that the fluid that was around your heart is no longer there. Of course, if the nature of the chest pain changes and you are worried at all or you develop shortness of breath along with this, you can go to the emergency department.  Please come back to see me in about 2 weeks.  [name], MD

## 2023-12-22 NOTE — Progress Notes (Signed)
    SUBJECTIVE:   CHIEF COMPLAINT / HPI:   Epigastric Pain This is a recurrent, episodic issue. She was actually hospitalized for the same back in June of last year with the consideration that her symptoms could be cardiac in nature.  She was evaluated by cardiology and symptoms were thought more likely to be of GI allergy.  They did, however, recommend that she have a CTA of the coronaries which has not yet been done.  She subsequently had an EGD in 05/2023 was positive for hemorrhagic gastropathy.  She has been on chronic PPI therapy since then, though does acknowledge missing a few days of her PPI around the time that the symptoms started.  She is now back on it. She also has chronic constipation and tells me that recently her stools have been harder than usual.  She denies any blood in her stool.  She has both MiraLAX and Linzess at home which she uses as needed.  She was seen by Dr. Elnoria Howard yesterday and his recommendation was that she continue her PPI and use her Linzess every day to keep her constipation under control.  PERTINENT  PMH / PSH: Asthma, T2DM, GERD  OBJECTIVE:   BP 126/72   Pulse 94   Ht 5\' 1"  (1.549 m)   Wt 146 lb (66.2 kg)   SpO2 96%   BMI 27.59 kg/m   Physical Exam Vitals reviewed.  Constitutional:      General: She is not in acute distress. Cardiovascular:     Rate and Rhythm: Normal rate and regular rhythm.     Heart sounds: No murmur heard. Abdominal:     General: Abdomen is flat. There is no distension.     Palpations: Abdomen is soft.     Tenderness: There is abdominal tenderness in the epigastric area. There is no guarding or rebound.  Neurological:     Mental Status: She is alert.    ECG with HR 63, Sinus rhythm. No STE, depression, or TWI to suggest ischemia.    ASSESSMENT/PLAN:   Assessment & Plan Epigastric pain Pain is reproducible on palpation.  I have the strong suspicion this is related to her advanced GERD and possibly due to her missing few  days of her PPI.  Thankfully, she is plugged in with GI and actually just saw Dr. Elnoria Howard yesterday.  Cardiac etiology is much less likely. ECG benign today. Though looking back through her chart it seems that she never had the CTA of the coronaries as recommended by cardiology.  Also note that there was a small pericardial effusion noted on her last echo.  Would benefit from follow-up study to ensure this is stable or resolved. -Continue daily PPI, can increase to twice daily if she does not improve over the next few days.  I advised her that if she does need to start taking this twice daily, she should call Dr. Elnoria Howard right away -Continue daily Linzess per GI's recommendation -CTA Coronaries -Repeat Echo ordered      J Dorothyann Gibbs, MD [facility]

## 2023-12-23 ENCOUNTER — Encounter: Payer: Self-pay | Admitting: Student

## 2023-12-23 LAB — BASIC METABOLIC PANEL
BUN/Creatinine Ratio: 13 (ref 12–28)
BUN: 13 mg/dL (ref 8–27)
CO2: 24 mmol/L (ref 20–29)
Calcium: 9.8 mg/dL (ref 8.7–10.3)
Chloride: 101 mmol/L (ref 96–106)
Creatinine, Ser: 0.97 mg/dL (ref 0.57–1.00)
Glucose: 119 mg/dL — ABNORMAL HIGH (ref 70–99)
Potassium: 4.8 mmol/L (ref 3.5–5.2)
Sodium: 140 mmol/L (ref 134–144)
eGFR: 62 mL/min/{1.73_m2} (ref >59)

## 2023-12-23 LAB — CBC
Hematocrit: 41.7 % (ref 34.0–46.6)
Hemoglobin: 13.7 g/dL (ref 11.1–15.9)
MCH: 27.3 pg (ref 26.6–33.0)
MCHC: 32.9 g/dL (ref 31.5–35.7)
MCV: 83 fL (ref 79–97)
Platelets: 407 10*3/uL (ref 150–450)
RBC: 5.02 x10E6/uL (ref 3.77–5.28)
RDW: 15.2 % (ref 11.7–15.4)
WBC: 10.2 10*3/uL (ref 3.4–10.8)

## 2024-01-04 DIAGNOSIS — L821 Other seborrheic keratosis: Secondary | ICD-10-CM | POA: Diagnosis not present

## 2024-01-04 DIAGNOSIS — L439 Lichen planus, unspecified: Secondary | ICD-10-CM | POA: Diagnosis not present

## 2024-01-04 DIAGNOSIS — D485 Neoplasm of uncertain behavior of skin: Secondary | ICD-10-CM | POA: Diagnosis not present

## 2024-01-10 ENCOUNTER — Encounter: Payer: Self-pay | Admitting: Family Medicine

## 2024-01-10 ENCOUNTER — Ambulatory Visit (INDEPENDENT_AMBULATORY_CARE_PROVIDER_SITE_OTHER): Payer: Self-pay | Admitting: Family Medicine

## 2024-01-10 VITALS — BP 141/83 | HR 65 | Ht 61.0 in | Wt 147.6 lb

## 2024-01-10 DIAGNOSIS — R1013 Epigastric pain: Secondary | ICD-10-CM

## 2024-01-10 DIAGNOSIS — E1140 Type 2 diabetes mellitus with diabetic neuropathy, unspecified: Secondary | ICD-10-CM

## 2024-01-10 LAB — POCT GLYCOSYLATED HEMOGLOBIN (HGB A1C): HbA1c, POC (controlled diabetic range): 7 % (ref 0.0–7.0)

## 2024-01-10 MED ORDER — PANTOPRAZOLE SODIUM 40 MG PO TBEC: 40.0000 mg | DELAYED_RELEASE_TABLET | Freq: Two times a day (BID) | ORAL | 3 refills | Status: DC

## 2024-01-10 NOTE — Assessment & Plan Note (Signed)
 A1c 7.0 today, at goal.  No changes.

## 2024-01-10 NOTE — Progress Notes (Signed)
    SUBJECTIVE:   CHIEF COMPLAINT / HPI:   PL is a 73yo F w/ hx of asthma, T2DM, GERD that p/f epigastric pain f/u. - Feels like her epigastric pain is improved since increasing PPI to BID at 2/27 visit w/ Dr. Marisue Humble.  - Feels like linzess is helping with her constipation. Has been going daily. Did have to skip a few days because she was having to much stool.  - Eating well, no N/V.  - Has not yet been able to get her echo scheduled  OBJECTIVE:   BP (!) 141/83   Pulse 65   Ht 5\' 1"  (1.549 m)   Wt 147 lb 9.6 oz (67 kg)   SpO2 98%   BMI 27.89 kg/m   General: Alert, pleasant woman. NAD. HEENT: NCAT. MMM. CV: RRR, no murmurs. Resp: CTAB, no wheezing or crackles. Normal WOB on RA.  Abm:  Tender to palpation in epigastrium.  No rebound, guarding, rigidity.  Soft, nondistended. BS present. Ext: Moves all ext spontaneously Skin: Warm, well perfused    ASSESSMENT/PLAN:   Assessment & Plan Epigastric pain Improving since increasing Protonix to twice daily.  Weight stable, tolerating p.o..  Was supposed to get an echo to workup cardiac etiologies, this has not been scheduled yet. - Advised to continue Protonix 40 mg twice daily.  Also okay to skip a few doses of Linzess if patient is having too much bowel movements. -Scheduled for echocardiogram Type 2 diabetes mellitus with diabetic neuropathy, without long-term current use of insulin (HCC) A1c 7.0 today, at goal.  No changes.    Lincoln Brigham, MD [facility]

## 2024-01-10 NOTE — Patient Instructions (Signed)
 Good to see you today - Thank you for coming in  Things we discussed today:  1) I'm glad that your chest/abdomen pain is improving.  - I am reordering your echocardiogram so we can make sure there are no changes since your prior echo.

## 2024-01-18 DIAGNOSIS — R1013 Epigastric pain: Secondary | ICD-10-CM | POA: Diagnosis not present

## 2024-01-18 DIAGNOSIS — K5904 Chronic idiopathic constipation: Secondary | ICD-10-CM | POA: Diagnosis not present

## 2024-01-20 ENCOUNTER — Ambulatory Visit (HOSPITAL_COMMUNITY)
Admission: RE | Admit: 2024-01-20 | Discharge: 2024-01-20 | Disposition: A | Discharge status: Home / Self Care | Source: Ambulatory Visit | Attending: Family Medicine | Admitting: Family Medicine

## 2024-01-20 DIAGNOSIS — I351 Nonrheumatic aortic (valve) insufficiency: Secondary | ICD-10-CM | POA: Diagnosis not present

## 2024-01-20 DIAGNOSIS — R079 Chest pain, unspecified: Secondary | ICD-10-CM

## 2024-01-20 DIAGNOSIS — R1013 Epigastric pain: Secondary | ICD-10-CM | POA: Diagnosis not present

## 2024-01-20 LAB — ECHOCARDIOGRAM COMPLETE
AR max vel: 1.57 cm2
AV Area VTI: 1.73 cm2
AV Area mean vel: 1.78 cm2
AV Mean grad: 5 mmHg
AV Peak grad: 12.8 mmHg
Ao pk vel: 1.79 m/s
Area-P 1/2: 3.06 cm2
Calc EF: 52.9 %
MV VTI: 1.66 cm2
P 1/2 time: 675 ms
S' Lateral: 2.9 cm
Single Plane A2C EF: 55.3 %
Single Plane A4C EF: 52.9 %

## 2024-02-14 NOTE — Progress Notes (Unsigned)
 Subjective:   Diana Roach is a 73 y.o. female who presents for Medicare Annual (Subsequent) preventive examination.  Patient consented to have virtual visit and was identified by name and date of birth. Method of visit: {TELEPHONE VS WJXBJ:47829}  Encounter participants: Patient: Public affairs consultant - located at *** Nurse/Provider: [employer] - located at [facility] (if applicable): ***    Review of Systems:  ***       Objective:     Vitals: There were no vitals taken for this visit.  There is no height or weight on file to calculate BMI.     12/22/2023    1:55 PM 09/25/2023    6:51 PM 09/09/2023    1:52 PM 09/06/2023    2:49 PM 09/02/2023    1:49 PM 04/18/2023   11:00 PM 04/17/2023   11:25 PM  Advanced Directives  Does Patient Have a Medical Advance Directive? No No No No No  No  Would patient like information on creating a medical advance directive? No - Patient declined No - Patient declined No - Patient declined No - Patient declined No - Patient declined No - Patient declined     Tobacco Social History   Tobacco Use  Smoking Status Former   Current packs/day: 0.00   Types: Cigarettes   Quit date: 10/25/1994   Years since quitting: 29.3   Passive exposure: Past  Smokeless Tobacco Never  Tobacco Comments   no plans to start again     Counseling given: Not Answered Tobacco comments: no plans to start again   Clinical Intake:                       Past Medical History:  Diagnosis Date   Abdominal discomfort, epigastric 03/17/2021   Abdominal pain 04/24/2014   Asthma    Bilateral finger numbness 09/04/2014   Cataract    Chest discomfort 12/25/2020   COVID-19 05/23/2019   Diabetes mellitus without complication (HCC)    Diverticulitis    Environmental allergies    Generalized pain 08/21/2019   Hair loss 07/19/2011   Knee pain, acute 11/24/2022   Left shoulder pain 07/05/2013   Lichen planus    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   Plantar wart of right foot 04/10/2021   Radiculopathy, cervical 09/04/2014   Rash 02/27/2020   Sinusitis, acute 11/20/2020   Past Surgical History:  Procedure Laterality Date   APPENDECTOMY  1969   BACK SURGERY  1986   lumbar spine   CARPAL TUNNEL RELEASE Bilateral    CATARACT EXTRACTION, BILATERAL  2020   COLONOSCOPY  2015   HAND SURGERY Left 2021   Family History  Problem Relation Age of Onset   Hypothyroidism Sister    Cervical cancer Sister    Fibromyalgia Sister    Heart disease Mother    Hypertension Brother    Lichen planus Brother    Cancer Brother        lung   Heart attack Brother    Diabetes Daughter    Cancer Daughter        skin cancer   Colon cancer Neg Hx    Pancreatic cancer Neg Hx    Stomach cancer Neg Hx    Esophageal cancer Neg Hx    Breast cancer Neg Hx    Social History   Socioeconomic History   Marital status: Widowed    Spouse name: Not on file  Number of children: 1   Years of education: 9   Highest education level: 9th grade  Occupational History   Occupation: retired    Associate Professor: [employer]    Comment: cleaner  Tobacco Use   Smoking status: Former    Current packs/day: 0.00    Types: Cigarettes    Quit date: 10/25/1994    Years since quitting: 29.3    Passive exposure: Past   Smokeless tobacco: Never   Tobacco comments:    no plans to start again  Vaping Use   Vaping status: Never Used  Substance and Sexual Activity   Alcohol use: No   Drug use: No   Sexual activity: Not Currently  Other Topics Concern   Not on file  Social History Narrative   Lives with daughter, son-in-law, grandson   House in one level house; has stairs to get into house with handrails, grab bars not present, smoke alarms present; throw rugs with backing   Has dog named Willow, chilhuahua/fox terrier   Likes to eat variety, meats, vegetables, fruits   Caffeine- sodas (Pepsi), water, tea, occasional  orange juice   Wears seatbelt, wears sunblock      Patient likes to swim, go to [employer], camping, shopping   Social Drivers of Health   Financial Resource Strain: Low Risk  (04/23/2023)   Overall Financial Resource Strain (CARDIA)    Difficulty of Paying Living Expenses: Not hard at all  Food Insecurity: No Food Insecurity (12/13/2022)   Hunger Vital Sign    Worried About Running Out of Food in the Last Year: Never true    Ran Out of Food in the Last Year: Never true  Transportation Needs: No Transportation Needs (04/23/2023)   PRAPARE - Administrator, Civil Service (Medical): No    Lack of Transportation (Non-Medical): No  Physical Activity: Insufficiently Active (12/13/2022)   Exercise Vital Sign    Days of Exercise per Week: 4 days    Minutes of Exercise per Session: 30 min  Stress: No Stress Concern Present (04/23/2023)   [employer] of Occupational Health - Occupational Stress Questionnaire    Feeling of Stress : Not at all  Social Connections: Moderately Integrated (04/23/2023)   Social Connection and Isolation Panel [NHANES]    Frequency of Communication with Friends and Family: More than three times a week    Frequency of Social Gatherings with Friends and Family: Twice a week    Attends Religious Services: More than 4 times per year    Active Member of [employer] or Organizations: Yes    Attends Banker Meetings: 1 to 4 times per year    Marital Status: Widowed    Outpatient Encounter Medications as of 02/15/2024  Medication Sig   acetaminophen  (TYLENOL ) 325 MG tablet Take 650 mg by mouth every 6 (six) hours as needed for mild pain or headache. (Patient not taking: Reported on 09/28/2023)   albuterol  (VENTOLIN  HFA) 108 (90 Base) MCG/ACT inhaler USE 2 INHALATIONS BY MOUTH EVERY 6 HOURS AS NEEDED FOR WHEEZING  FOR SHORTNESS OF BREATH (Patient not taking: Reported on 09/28/2023)   benzonatate  (TESSALON ) 100 MG capsule Take 1 capsule (100 mg total) by mouth  2 (two) times daily as needed for cough. (Patient not taking: Reported on 09/28/2023)   clobetasol  ointment (TEMOVATE ) 0.05 % Apply 1 Application topically at bedtime. (Patient not taking: Reported on 09/28/2023)   Fluticasone  Furoate (ARNUITY ELLIPTA ) 100 MCG/ACT AEPB Inhale 100 mcg into the lungs daily.  HYDROcodone -acetaminophen  (NORCO/VICODIN) 5-325 MG tablet Take 1 tablet by mouth every 6 (six) hours as needed.   loratadine  (CLARITIN ) 10 MG tablet Take 1 tablet (10 mg total) by mouth daily.   nebivolol  (BYSTOLIC ) 2.5 MG tablet Take 1 tablet (2.5 mg total) by mouth daily as needed. (Patient not taking: Reported on 09/28/2023)   pantoprazole  (PROTONIX ) 40 MG tablet Take 1 tablet (40 mg total) by mouth 2 (two) times daily.   Polyethyl Glycol-Propyl Glycol (SYSTANE OP) Apply 1 drop to eye 4 (four) times daily as needed (for dry eye). (Patient not taking: Reported on 09/28/2023)   polyethylene glycol (MIRALAX  / GLYCOLAX ) 17 g packet Take 17 g by mouth daily. (Patient not taking: Reported on 09/28/2023)   rosuvastatin  (CRESTOR ) 5 MG tablet Take 1 tablet (5 mg total) by mouth daily.   No facility-administered encounter medications on file as of 02/15/2024.    Activities of Daily Living    04/18/2023   11:05 PM 04/18/2023    9:00 PM  In your present state of health, do you have any difficulty performing the following activities:  Hearing?  0  Vision?  0  Difficulty concentrating or making decisions?  0  Walking or climbing stairs?  0  Dressing or bathing?  0  Doing errands, shopping? 0     Patient Care Team: Limmie Ren, MD as PCP - General (Family Medicine) Jann Melody, MD as PCP - Cardiology (Cardiology) Ronn Cohn, MD as Consulting Physician (Orthopedic Surgery) Alvis Jourdain, MD as Consulting Physician (Gastroenterology) Corie Diamond, MD as Consulting Physician (Ophthalmology) Dorthey Gave, PA-C as Physician Assistant (Dermatology)    Assessment:   This is  a routine wellness examination for Chundra.  Exercise Activities and Dietary recommendations     Goals      Patient Stated     Exercise more; be less sedentary. Go to gym more.         Fall Risk    01/10/2024    9:54 AM 12/22/2023    1:54 PM 10/17/2023    3:04 PM 09/09/2023    1:52 PM 09/02/2023    1:51 PM  Fall Risk   Falls in the past year? 0 0 0 0 0  Number falls in past yr: 0 0 0 0 0  Injury with Fall? 0 0 0 0 0  Risk for fall due to :  No Fall Risks  No Fall Risks No Fall Risks  Follow up  Falls evaluation completed  Falls evaluation completed Falls prevention discussed   Is the patient's home free of loose throw rugs in walkways, pet beds, electrical cords, etc?   {Blank single:19197::"yes","no"}      Grab bars in the bathroom? {Blank single:19197::"yes","no"}      Handrails on the stairs?   {Blank single:19197::"yes","no"}      Adequate lighting?   {Blank single:19197::"yes","no"}  Patient rating of health (0-10) scale: ***   Depression Screen    01/10/2024    9:54 AM 12/22/2023    1:54 PM 10/17/2023    3:04 PM 09/28/2023    5:08 PM  PHQ 2/9 Scores  PHQ - 2 Score 0 0 0 0  PHQ- 9 Score 1 1 1 1      Cognitive Function    09/12/2018   11:31 AM  MMSE - Mini Mental State Exam  Orientation to time 5  Orientation to Place 5  Registration 3  Attention/ Calculation 5  Recall 3  Language- name 2 objects 2  Language- repeat 1  Language- follow 3 step command 3  Language- read & follow direction 1  Write a sentence 1  Copy design 1  Total score 30        12/13/2022    1:09 PM 09/12/2018   11:31 AM  6CIT Screen  What Year? 0 points 0 points  What month? 0 points 0 points  What time? 0 points 0 points  Count back from 20 0 points 0 points  Months in reverse 0 points 0 points  Repeat phrase 0 points 0 points  Total Score 0 points 0 points    Immunization History  Administered Date(s) Administered   Fluad Quad(high Dose 65+) 08/12/2020, 09/01/2022    Fluad Trivalent(High Dose 65+) 07/28/2023   Influenza Split 11/03/2012   Influenza,inj,Quad PF,6+ Mos 09/24/2013, 09/04/2014, 08/11/2016, 07/29/2017, 09/12/2018, 07/24/2019, 08/04/2021   PFIZER(Purple Top)SARS-COV-2 Vaccination 07/10/2020, 07/31/2020   Pneumococcal Conjugate-13 07/29/2017   Pneumococcal Polysaccharide-23 09/12/2018   Td 06/14/2008   Tdap 10/26/2019     Screening Tests Health Maintenance  Topic Date Due   Zoster Vaccines- Shingrix (1 of 2) Never done   COVID-19 Vaccine (3 - 2024-25 season) 06/26/2023   Diabetic kidney evaluation - Urine ACR  11/18/2023   Medicare Annual Wellness (AWV)  12/14/2023   OPHTHALMOLOGY EXAM  12/15/2023   INFLUENZA VACCINE  05/25/2024   HEMOGLOBIN A1C  07/12/2024   Diabetic kidney evaluation - eGFR measurement  12/21/2024   MAMMOGRAM  06/28/2025   DTaP/Tdap/Td (3 - Td or Tdap) 10/25/2029   Colonoscopy  07/05/2033   Pneumonia Vaccine 65+ Years old  Completed   DEXA SCAN  Completed   Hepatitis C Screening  Completed   HPV VACCINES  Aged Out   Meningococcal B Vaccine  Aged Out   FOOT EXAM  Discontinued    Cancer Screenings: Lung: Low Dose CT Chest recommended if Age 33-80 years, 20 pack-year currently smoking OR have quit w/in 15years. Patient {DOES NOT does:27190::"does not"} qualify. Breast:  Up to date on Mammogram? {Yes/No:30480221}   Up to date of Bone Density/Dexa? {Yes/No:30480221} Colorectal: ***  Additional Screenings: ***: Hepatitis C Screening:      Plan:   ***   I have personally reviewed and noted the following in the patient's chart:   Medical and social history Use of alcohol, tobacco or illicit drugs  Current medications and supplements Functional ability and status Nutritional status Physical activity Advanced directives List of other physicians Hospitalizations, surgeries, and ER visits in previous 12 months Vitals Screenings to include cognitive, depression, and falls Referrals and appointments  In  addition, I have reviewed and discussed with patient certain preventive protocols, quality metrics, and best practice recommendations. A written personalized care plan for preventive services as well as general preventive health recommendations were provided to patient.    This visit was conducted virtually ***  Genora Kidd, MD  02/14/2024

## 2024-02-15 ENCOUNTER — Ambulatory Visit: Admitting: Student

## 2024-02-15 ENCOUNTER — Encounter: Payer: Self-pay | Admitting: Student

## 2024-02-15 VITALS — BP 140/60 | HR 64 | Ht 61.81 in | Wt 147.4 lb

## 2024-02-15 DIAGNOSIS — Z0000 Encounter for general adult medical examination without abnormal findings: Secondary | ICD-10-CM

## 2024-02-15 NOTE — Patient Instructions (Signed)
 Medicare Annual Wellness Visit  Please follow up with PCP about hearing, you can get over the counter hearing aids as well Please look at advance care planning attached

## 2024-03-15 ENCOUNTER — Other Ambulatory Visit: Payer: Self-pay | Admitting: Student

## 2024-03-15 DIAGNOSIS — J4540 Moderate persistent asthma, uncomplicated: Secondary | ICD-10-CM

## 2024-03-24 ENCOUNTER — Encounter: Payer: Self-pay | Admitting: Internal Medicine

## 2024-03-24 ENCOUNTER — Ambulatory Visit
Admission: EM | Admit: 2024-03-24 | Discharge: 2024-03-24 | Disposition: A | Discharge status: Home / Self Care | Attending: Internal Medicine | Admitting: Internal Medicine

## 2024-03-24 DIAGNOSIS — N3001 Acute cystitis with hematuria: Secondary | ICD-10-CM | POA: Diagnosis not present

## 2024-03-24 LAB — POCT URINALYSIS DIP (MANUAL ENTRY)
Bilirubin, UA: NEGATIVE
Glucose, UA: NEGATIVE mg/dL
Ketones, POC UA: NEGATIVE mg/dL
Nitrite, UA: NEGATIVE
Protein Ur, POC: NEGATIVE mg/dL
Spec Grav, UA: 1.015
Urobilinogen, UA: 0.2 U/dL
pH, UA: 7

## 2024-03-24 MED ORDER — NITROFURANTOIN MONOHYD MACRO 100 MG PO CAPS: 100.0000 mg | ORAL_CAPSULE | Freq: Two times a day (BID) | ORAL | 0 refills | Status: DC

## 2024-03-24 NOTE — ED Triage Notes (Signed)
 Pt c/o UTI sxs x 1 day.

## 2024-03-24 NOTE — Discharge Instructions (Signed)
 Urinalysis and symptoms are consistent with a urinary tract infection. We can treat with the following:  Macrobid 100 mg twice daily for 5 days. This is an antibiotic. Drink plenty of water to stay hydrated. Avoid excessive caffeine. Return to urgent care or PCP if symptoms worsen or fail to resolve.

## 2024-03-24 NOTE — ED Provider Notes (Signed)
 [facility]    CSN: [id number] Arrival date & time: 03/24/24  0806      History   Chief Complaint Chief Complaint  Patient presents with   Urinary Frequency   Abdominal Pain    HPI Diana Roach is a 73 y.o. female.   73 year old female who presents urgent care with complaints of urinary urgency, frequency, suprapubic pain and dysuria.  The symptoms started about a day ago.  Her daughter bought her some cranberry juice to try but her symptoms have not improved.  She reports that she was up and down all night going to the bathroom.  She denies any fevers, chills, nausea, vomiting.  She has had some chronic issues with her groins bilateral and is following up with her primary care doctor regarding this.   Urinary Frequency Associated symptoms include abdominal pain. Pertinent negatives include no chest pain and no shortness of breath.  Abdominal Pain Associated symptoms: dysuria   Associated symptoms: no chest pain, no chills, no cough, no fever, no hematuria, no shortness of breath, no sore throat and no vomiting     Past Medical History:  Diagnosis Date   Abdominal discomfort, epigastric 03/17/2021   Abdominal pain 04/24/2014   Allergy    Arthritis    Asthma    Bilateral finger numbness 09/04/2014   Cataract    Chest discomfort 12/25/2020   COVID-19 05/23/2019   Diabetes mellitus without complication (HCC)    Diverticulitis    Environmental allergies    Generalized pain 08/21/2019   GERD (gastroesophageal reflux disease)    Hair loss 07/19/2011   Heart murmur    Hyperlipidemia    Knee pain, acute 11/24/2022   Left shoulder pain 07/05/2013   Lichen planus    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   Osteoporosis    Plantar wart of right foot 04/10/2021   Radiculopathy, cervical 09/04/2014   Rash 02/27/2020   Sinusitis, acute 11/20/2020    Patient Active Problem List   Diagnosis Date Noted   Rainier Feuerborn coat syndrome  without diagnosis of hypertension 10/17/2023   Community acquired bacterial pneumonia 09/07/2023   Epistaxis 07/29/2023   Candidate for statin therapy due to risk of future cardiovascular event 12/15/2022   Lower extremity pain 11/18/2022   RUQ pain 05/20/2022   Long COVID 03/26/2022   Osteoporosis 11/23/2019   GERD (gastroesophageal reflux disease) 06/19/2019   Palpitations 02/21/2019   Type 2 diabetes mellitus with diabetic neuropathy, without long-term current use of insulin  (HCC) 10/24/2017   Varicose vein of leg 03/09/2016   UPJ obstruction, congenital 01/28/2016   Lumbosacral radiculopathy 01/27/2016   Diverticulosis 10/01/2014   Viral syndrome 10/14/2011   Asthma, moderate persistent 05/13/2011   Lichen planus 02/03/2011   Hypercholesteremia 12/22/2006    Past Surgical History:  Procedure Laterality Date   APPENDECTOMY  1969   BACK SURGERY  1986   lumbar spine   CARPAL TUNNEL RELEASE Bilateral    CATARACT EXTRACTION, BILATERAL  2020   COLONOSCOPY  2015   HAND SURGERY Left 2021    OB History   No obstetric history on file.      Home Medications    Prior to Admission medications   Medication Sig Start Date End Date Taking? Authorizing Provider  nitrofurantoin, macrocrystal-monohydrate, (MACROBID) 100 MG capsule Take 1 capsule (100 mg total) by mouth 2 (two) times daily. 03/24/24  Yes Jethro Radke A, PA-C  acetaminophen  (TYLENOL ) 325 MG tablet Take 650 mg by mouth  every 6 (six) hours as needed for mild pain (pain score 1-3) or headache.    [provider]  albuterol  (VENTOLIN  HFA) 108 (90 Base) MCG/ACT inhaler USE 2 INHALATIONS BY MOUTH EVERY 6 HOURS AS NEEDED FOR WHEEZING  FOR SHORTNESS OF BREATH Patient taking differently: USE 2 INHALATIONS BY MOUTH EVERY 6 HOURS AS NEEDED FOR WHEEZING  FOR SHORTNESS OF BREATH 03/11/23   Limmie Ren, MD  amLODipine -olmesartan  (AZOR ) 5-20 MG tablet Take 1 tablet by mouth daily.    [provider]  ARNUITY  ELLIPTA 100 MCG/ACT AEPB Inhale 1 puff by mouth once daily 03/15/24   Limmie Ren, MD  cephALEXin  (KEFLEX ) 500 MG capsule TAKE 1 CAPSULE BY MOUTH TWICE DAILY FOR 5 DAYS    [provider]  clobetasol  ointment (TEMOVATE ) 0.05 % Apply 1 Application topically at bedtime. 03/11/23   Limmie Ren, MD  fluticasone  (FLOVENT  HFA) 110 MCG/ACT inhaler Inhale 2 puffs into the lungs 2 (two) times daily.    [provider]  linaclotide (LINZESS) 145 MCG CAPS capsule Take 145 mcg by mouth daily before breakfast.    [provider]  loratadine  (CLARITIN ) 10 MG tablet Take 1 tablet (10 mg total) by mouth daily. 09/02/23   Limmie Ren, MD  meloxicam  (MOBIC ) 15 MG tablet Take 1 tablet by mouth daily.    [provider]  pantoprazole  (PROTONIX ) 40 MG tablet Take 1 tablet (40 mg total) by mouth 2 (two) times daily. 01/10/24   Albin Huh, MD  Polyethyl Glycol-Propyl Glycol (SYSTANE OP) Apply 1 drop to eye 4 (four) times daily as needed (for dry eye).    [provider]  polyethylene glycol (MIRALAX  / GLYCOLAX ) 17 g packet Take 17 g by mouth daily. 03/12/21   America Just, DO  Sod Picosulfate-Mag Ox-Cit Acd (CLENPIQ) 10-3.5-12 MG-GM -GM/175ML SOLN See admin instructions.    [provider]    Family History Family History  Problem Relation Age of Onset   Heart disease Mother    Diabetes Mother    Hypertension Mother    Hypothyroidism Sister    Cervical cancer Sister    Fibromyalgia Sister    Hypertension Brother    Lichen planus Brother    Cancer Brother        lung   Heart attack Brother    Diabetes Daughter    Cancer Daughter        skin cancer   Colon cancer Neg Hx    Pancreatic cancer Neg Hx    Stomach cancer Neg Hx    Esophageal cancer Neg Hx    Breast cancer Neg Hx     Social History Social History   Tobacco Use   Smoking status: Former    Current packs/day: 0.00    Average packs/day: 1.5 packs/day for 25.0 years (37.5  ttl pk-yrs)    Types: Cigarettes    Quit date: 10/25/1994    Years since quitting: 29.4    Passive exposure: Past   Smokeless tobacco: Never   Tobacco comments:    no plans to start again  Vaping Use   Vaping status: Never Used  Substance Use Topics   Alcohol use: No   Drug use: No     Allergies   Codeine, Augmentin  [amoxicillin -pot clavulanate], Iodinated contrast media, Levaquin [levofloxacin], Minocycline , and Crestor  [rosuvastatin ]   Review of Systems Review of Systems  Constitutional:  Negative for chills and fever.  HENT:  Negative for ear pain and sore  throat.   Eyes:  Negative for pain and visual disturbance.  Respiratory:  Negative for cough and shortness of breath.   Cardiovascular:  Negative for chest pain and palpitations.  Gastrointestinal:  Positive for abdominal pain. Negative for vomiting.  Genitourinary:  Positive for dysuria, frequency, pelvic pain and urgency. Negative for hematuria.  Musculoskeletal:  Negative for arthralgias and back pain.  Skin:  Negative for color change and rash.  Neurological:  Negative for seizures and syncope.  All other systems reviewed and are negative.    Physical Exam Triage Vital Signs ED Triage Vitals  Encounter Vitals Group     BP 03/24/24 0826 (!) 142/93     Systolic BP Percentile --      Diastolic BP Percentile --      Pulse Rate 03/24/24 0826 94     Resp 03/24/24 0826 18     Temp 03/24/24 0826 98.4 F (36.9 C)     Temp Source 03/24/24 0826 Oral     SpO2 03/24/24 0826 97 %     Weight 03/24/24 0826 147 lb 7.8 oz (66.9 kg)     Height --      Head Circumference --      Peak Flow --      Pain Score 03/24/24 0825 8     Pain Loc --      Pain Education --      Exclude from Growth Chart --    No data found.  Updated Vital Signs BP (!) 142/93 (BP Location: Left Arm)   Pulse 94   Temp 98.4 F (36.9 C) (Oral)   Resp 18   Wt 147 lb 7.8 oz (66.9 kg)   SpO2 97%   BMI 27.14 kg/m   Visual Acuity Right Eye  Distance:   Left Eye Distance:   Bilateral Distance:    Right Eye Near:   Left Eye Near:    Bilateral Near:     Physical Exam Vitals and nursing note reviewed.  Constitutional:      General: She is not in acute distress.    Appearance: She is well-developed.  HENT:     Head: Normocephalic and atraumatic.  Eyes:     Conjunctiva/sclera: Conjunctivae normal.  Cardiovascular:     Rate and Rhythm: Normal rate and regular rhythm.     Heart sounds: No murmur heard. Pulmonary:     Effort: Pulmonary effort is normal. No respiratory distress.     Breath sounds: Normal breath sounds.  Abdominal:     General: Bowel sounds are normal.     Palpations: Abdomen is soft.     Tenderness: There is abdominal tenderness in the suprapubic area. There is no guarding or rebound.  Musculoskeletal:        General: No swelling.     Cervical back: Neck supple.  Skin:    General: Skin is warm and dry.     Capillary Refill: Capillary refill takes less than 2 seconds.  Neurological:     Mental Status: She is alert.  Psychiatric:        Mood and Affect: Mood normal.      UC Treatments / Results  Labs (all labs ordered are listed, but only abnormal results are displayed) Labs Reviewed  POCT URINALYSIS DIP (MANUAL ENTRY) - Abnormal; Notable for the following components:      Result Value   Blood, UA moderate (*)    Leukocytes, UA Moderate (2+) (*)    All other components within normal  limits    EKG   Radiology No results found.  Procedures Procedures (including critical care time)  Medications Ordered in UC Medications - No data to display  Initial Impression / Assessment and Plan / UC Course  I have reviewed the triage vital signs and the nursing notes.  Pertinent labs & imaging results that were available during my care of the patient were reviewed by me and considered in my medical decision making (see chart for details).     Acute cystitis with hematuria   Urinalysis shows  Abijah Roussel blood cells as well as red blood cells.  Symptoms are consistent with urinary tract infection.  Along with urinalysis and the symptoms we will treat for a UTI.  We will treat with Macrobid 100 mg twice daily for 5 days.  Advised the patient to drink plenty of water and stay hydrated.  Advised to avoid excessive caffeinated products.  Make sure to keep follow-up appointment with primary care physician regarding the chronic groin symptoms as scheduled.  Final Clinical Impressions(s) / UC Diagnoses   Final diagnoses:  Acute cystitis with hematuria     Discharge Instructions      Urinalysis and symptoms are consistent with a urinary tract infection. We can treat with the following:  Macrobid 100 mg twice daily for 5 days. This is an antibiotic. Drink plenty of water to stay hydrated. Avoid excessive caffeine. Return to urgent care or PCP if symptoms worsen or fail to resolve.    ED Prescriptions     Medication Sig Dispense Auth. Provider   nitrofurantoin, macrocrystal-monohydrate, (MACROBID) 100 MG capsule Take 1 capsule (100 mg total) by mouth 2 (two) times daily. 10 capsule Kreg Pesa, [place]      PDMP not reviewed this encounter.   Kreg Pesa, [place] 03/24/24 [phone]

## 2024-04-03 ENCOUNTER — Encounter: Payer: Self-pay | Admitting: *Deleted

## 2024-04-06 ENCOUNTER — Ambulatory Visit (INDEPENDENT_AMBULATORY_CARE_PROVIDER_SITE_OTHER): Admitting: Student

## 2024-04-06 VITALS — BP 130/68 | HR 72 | Wt 149.0 lb

## 2024-04-06 DIAGNOSIS — R1032 Left lower quadrant pain: Secondary | ICD-10-CM | POA: Diagnosis not present

## 2024-04-06 DIAGNOSIS — R300 Dysuria: Secondary | ICD-10-CM | POA: Diagnosis not present

## 2024-04-06 DIAGNOSIS — R319 Hematuria, unspecified: Secondary | ICD-10-CM

## 2024-04-06 LAB — POCT URINALYSIS DIP (MANUAL ENTRY)
Bilirubin, UA: NEGATIVE
Blood, UA: NEGATIVE
Glucose, UA: NEGATIVE mg/dL
Ketones, POC UA: NEGATIVE mg/dL
Leukocytes, UA: NEGATIVE
Nitrite, UA: NEGATIVE
Protein Ur, POC: NEGATIVE mg/dL
Spec Grav, UA: 1.02 (ref 1.010–1.025)
Urobilinogen, UA: 0.2 U/dL
pH, UA: 7 (ref 5.0–8.0)

## 2024-04-06 NOTE — Progress Notes (Unsigned)
    SUBJECTIVE:   CHIEF COMPLAINT / HPI:   RF in 2020 WNL. Sister with fibromyalgia.  PERTINENT  PMH / PSH: ***  OBJECTIVE:   There were no vitals taken for this visit.  ***  ASSESSMENT/PLAN:   Assessment & Plan Hematuria, unspecified type  Left inguinal pain      J Lark Plum, MD [facility]

## 2024-04-06 NOTE — Patient Instructions (Signed)
 Ms. Avaiah, Stempel to see you. I am suspicious you have a hernia. Let's get an ultrasound to see for sure. Once approved by your insurance, [facility] will call you to schedule this.  Your urine looks healthy and normal now. You could try a dose or two of Azo (available over the counter) but if you're still having symptoms in 2-3 weeks, come back to see us .  Alexa Andrews, MD

## 2024-04-09 ENCOUNTER — Ambulatory Visit
Admission: RE | Admit: 2024-04-09 | Discharge: 2024-04-09 | Disposition: A | Discharge status: Home / Self Care | Source: Ambulatory Visit | Attending: Family Medicine | Admitting: Family Medicine

## 2024-04-09 DIAGNOSIS — R1032 Left lower quadrant pain: Secondary | ICD-10-CM | POA: Diagnosis not present

## 2024-04-10 ENCOUNTER — Other Ambulatory Visit: Payer: Self-pay | Admitting: Student

## 2024-04-10 DIAGNOSIS — J4540 Moderate persistent asthma, uncomplicated: Secondary | ICD-10-CM

## 2024-04-19 ENCOUNTER — Ambulatory Visit: Payer: Self-pay | Admitting: Student

## 2024-05-03 DIAGNOSIS — H43813 Vitreous degeneration, bilateral: Secondary | ICD-10-CM | POA: Diagnosis not present

## 2024-05-03 DIAGNOSIS — H26492 Other secondary cataract, left eye: Secondary | ICD-10-CM | POA: Diagnosis not present

## 2024-05-03 DIAGNOSIS — Z961 Presence of intraocular lens: Secondary | ICD-10-CM | POA: Diagnosis not present

## 2024-05-03 DIAGNOSIS — E119 Type 2 diabetes mellitus without complications: Secondary | ICD-10-CM | POA: Diagnosis not present

## 2024-05-03 DIAGNOSIS — H353132 Nonexudative age-related macular degeneration, bilateral, intermediate dry stage: Secondary | ICD-10-CM | POA: Diagnosis not present

## 2024-05-03 DIAGNOSIS — D3131 Benign neoplasm of right choroid: Secondary | ICD-10-CM | POA: Diagnosis not present

## 2024-05-03 LAB — OPHTHALMOLOGY REPORT-SCANNED

## 2024-05-08 ENCOUNTER — Other Ambulatory Visit: Payer: Self-pay | Admitting: *Deleted

## 2024-05-08 DIAGNOSIS — J4540 Moderate persistent asthma, uncomplicated: Secondary | ICD-10-CM

## 2024-05-08 MED ORDER — ARNUITY ELLIPTA 100 MCG/ACT IN AEPB: 1.0000 | INHALATION_SPRAY | Freq: Every day | RESPIRATORY_TRACT | 2 refills | Status: DC

## 2024-05-31 ENCOUNTER — Ambulatory Visit (INDEPENDENT_AMBULATORY_CARE_PROVIDER_SITE_OTHER): Admitting: Family Medicine

## 2024-05-31 VITALS — BP 156/84 | HR 61 | Temp 98.0°F | Ht 61.0 in | Wt 146.6 lb

## 2024-05-31 DIAGNOSIS — R079 Chest pain, unspecified: Secondary | ICD-10-CM

## 2024-05-31 DIAGNOSIS — T63301A Toxic effect of unspecified spider venom, accidental (unintentional), initial encounter: Secondary | ICD-10-CM

## 2024-05-31 DIAGNOSIS — R1013 Epigastric pain: Secondary | ICD-10-CM | POA: Diagnosis not present

## 2024-05-31 NOTE — Progress Notes (Signed)
    SUBJECTIVE:   CHIEF COMPLAINT / HPI: spider bite  Stomach pain Pain in center upper part of chest History of diverticulosis Radiates upwards Pain is sharp Sometimes wraps around abdomen Takes Linzess/Miralax  to have bowel movments Has bowel movement roughly every 3 days Last bowel movement this morning Bowel movement was maybe mucousy Reports have not been normal since Sunday of last week No blood No specific triggers, maybe after eating, none with exertion Taking Protonix  only once per day No fevers Previous evaluation hospital without cardiac cause Follows with GI, thought to be secondary to constipation  Spider bite Bit 3 weeks ago on hand Put some triple antibiotic on Now getting better Does have some redness still No pain No fevers  PERTINENT  PMH / PSH: Asthma, hx of diverticulitis, T2DM, History of GI bleed  OBJECTIVE:   BP (!) 156/84   Pulse 61   Temp 98 F (36.7 C)   Ht 5' 1 (1.549 m)   Wt 146 lb 9.6 oz (66.5 kg)   SpO2 95%   BMI 27.70 kg/m   General: NAD  Neuro: A&O Cardiovascular: RRR, no murmurs,  Respiratory: normal WOB on RA, CTAB, no wheezes, ronchi or rales Abdomen: soft, mild epigastric tenderness, no rebound or guarding Extremities: Moving all 4 extremities equally, no peripheral edema Hand: 0.5 cm erythematous papule on the dorsal aspect of the wrist overlying the second extensor compartment, central darkening, nontender to palpation    ASSESSMENT/PLAN:   Assessment & Plan Epigastric pain Chest pain, unspecified type Acute on chronic, previous cardiac and full GI workups with most likely etiology secondary to chronic constipation.  Recommended patient resume taking Protonix  twice daily and Linzess daily.  No evidence of acute diverticulitis.  Follow-up 2 weeks if not improving.  Doubt cardiac etiology; however, did not have coronary CT as recommended after last hospital admission.  Will order today for further evaluation. Spider  bite wound, accidental or unintentional, initial encounter Improving, no signs of active infection or cellulitis.  If clinically worsening patient instructed to call back, we will send doxycycline  10-day course.  Return if symptoms worsen or fail to improve.  Diana Provencal, MD [facility]

## 2024-05-31 NOTE — Patient Instructions (Addendum)
 It was great to see you! Thank you for allowing me to participate in your care!  Our plans for today:  - Please take your Protonix  twice daily for the next 2 weeks. - Try taking your Linzess daily for the next week.  - I have ordered a coronary CT to look at the arteries in your heart. They should call you schedule this.   Please arrive 15 minutes PRIOR to your next scheduled appointment time! If you do not, this affects OTHER patients' care.  Take care and seek immediate care sooner if you develop any concerns.   Diana Provencal, MD, PGY-3 [facility] 11:28 AM 05/31/2024  [facility]

## 2024-06-04 ENCOUNTER — Telehealth: Payer: Self-pay | Admitting: *Deleted

## 2024-06-04 ENCOUNTER — Other Ambulatory Visit: Payer: Self-pay | Admitting: Family Medicine

## 2024-06-04 DIAGNOSIS — Z91041 Radiographic dye allergy status: Secondary | ICD-10-CM

## 2024-06-04 MED ORDER — PREDNISONE 50 MG PO TABS
ORAL_TABLET | ORAL | 0 refills | Status: DC
Start: 2024-06-04 — End: 2024-06-04

## 2024-06-04 MED ORDER — PREDNISONE 50 MG PO TABS: ORAL_TABLET | ORAL | 0 refills | Status: DC

## 2024-06-04 MED ORDER — DIPHENHYDRAMINE HCL 25 MG PO CAPS: 50.0000 mg | ORAL_CAPSULE | Freq: Once | ORAL | 0 refills | Status: DC

## 2024-06-04 NOTE — Progress Notes (Signed)
 Contrast allergy premedication.

## 2024-06-04 NOTE — Telephone Encounter (Signed)
 Spoke with patient. Informed that medication had been sent to pharmacy. Patient understood. Nelson Land, CMA

## 2024-06-04 NOTE — Telephone Encounter (Signed)
 Pt is allergic to contrast.  Imaging center told her to get 50mg  predisone to take before procedure.  To Dr. Alba. Harlene Carte, CMA

## 2024-06-05 ENCOUNTER — Telehealth (HOSPITAL_COMMUNITY): Payer: Self-pay | Admitting: *Deleted

## 2024-06-05 NOTE — Telephone Encounter (Signed)
 Reaching out to patient to offer assistance regarding upcoming cardiac imaging study; pt verbalizes understanding of appt date/time, parking situation and where to check in, pre-test NPO status and medications ordered, and verified current allergies; name and call back number provided for further questions should they arise Johney Frame RN Navigator Cardiac Imaging [facility] [phone] office [phone] cell  Patient verbalized understanding of allergy prep.

## 2024-06-06 ENCOUNTER — Ambulatory Visit (HOSPITAL_COMMUNITY)
Admission: RE | Admit: 2024-06-06 | Discharge: 2024-06-06 | Disposition: A | Discharge status: Home / Self Care | Source: Ambulatory Visit | Attending: Family Medicine | Admitting: Family Medicine

## 2024-06-06 DIAGNOSIS — R079 Chest pain, unspecified: Secondary | ICD-10-CM | POA: Insufficient documentation

## 2024-06-06 DIAGNOSIS — I2510 Atherosclerotic heart disease of native coronary artery without angina pectoris: Secondary | ICD-10-CM | POA: Insufficient documentation

## 2024-06-06 DIAGNOSIS — R1013 Epigastric pain: Secondary | ICD-10-CM | POA: Diagnosis not present

## 2024-06-06 LAB — POCT I-STAT CREATININE: Creatinine, Ser: 1.1 mg/dL — ABNORMAL HIGH (ref 0.44–1.00)

## 2024-06-06 MED ORDER — IOHEXOL 350 MG/ML SOLN
100.0000 mL | Freq: Once | INTRAVENOUS | Status: AC | PRN
  Administered 2024-06-06 (×2): 100 mL via INTRAVENOUS

## 2024-06-06 MED ORDER — NITROGLYCERIN 0.4 MG SL SUBL
0.8000 mg | SUBLINGUAL_TABLET | Freq: Once | SUBLINGUAL | Status: AC
  Administered 2024-06-06 (×2): 0.8 mg via SUBLINGUAL

## 2024-06-07 ENCOUNTER — Other Ambulatory Visit: Payer: Self-pay | Admitting: Family Medicine

## 2024-06-07 ENCOUNTER — Other Ambulatory Visit: Payer: Self-pay

## 2024-06-07 DIAGNOSIS — Z1231 Encounter for screening mammogram for malignant neoplasm of breast: Secondary | ICD-10-CM

## 2024-06-07 MED ORDER — ALBUTEROL SULFATE HFA 108 (90 BASE) MCG/ACT IN AERS: INHALATION_SPRAY | RESPIRATORY_TRACT | 3 refills | Status: AC

## 2024-06-08 ENCOUNTER — Encounter: Payer: Self-pay | Admitting: Family Medicine

## 2024-06-11 ENCOUNTER — Ambulatory Visit: Payer: Self-pay | Admitting: Family Medicine

## 2024-06-11 ENCOUNTER — Telehealth: Payer: Self-pay | Admitting: Family Medicine

## 2024-06-11 DIAGNOSIS — I2510 Atherosclerotic heart disease of native coronary artery without angina pectoris: Secondary | ICD-10-CM | POA: Insufficient documentation

## 2024-06-11 MED ORDER — ASPIRIN 81 MG PO TBEC: 81.0000 mg | DELAYED_RELEASE_TABLET | Freq: Every day | ORAL | 12 refills | Status: AC

## 2024-06-11 NOTE — Telephone Encounter (Signed)
 Called patient to discuss results of coronary CT.  Discussed minimal plaque buildup in several coronary arteries.  Discussed calcium  score 122.  Recommended that is reasonable to for her to start aspirin  for secondary prevention given evidence of CAD.  CAD added to problem list.  Patient also brought up that she was unable to tolerate statins previously and is wondering about her cholesterol.  I recommended that she schedule an appointment for recheck of her lipid panel and discussion of reduction in cardiac risk factors.  She is agreeable to this plan.

## 2024-06-18 DIAGNOSIS — K5904 Chronic idiopathic constipation: Secondary | ICD-10-CM | POA: Diagnosis not present

## 2024-06-18 DIAGNOSIS — R140 Abdominal distension (gaseous): Secondary | ICD-10-CM | POA: Diagnosis not present

## 2024-06-18 DIAGNOSIS — R1013 Epigastric pain: Secondary | ICD-10-CM | POA: Diagnosis not present

## 2024-06-29 ENCOUNTER — Ambulatory Visit
Admission: RE | Admit: 2024-06-29 | Discharge: 2024-06-29 | Disposition: A | Discharge status: Home / Self Care | Source: Ambulatory Visit | Attending: Family Medicine | Admitting: Family Medicine

## 2024-06-29 DIAGNOSIS — Z1231 Encounter for screening mammogram for malignant neoplasm of breast: Secondary | ICD-10-CM

## 2024-07-04 ENCOUNTER — Ambulatory Visit: Payer: Self-pay | Admitting: Family Medicine

## 2024-07-06 ENCOUNTER — Encounter: Payer: Self-pay | Admitting: Internal Medicine

## 2024-07-16 DIAGNOSIS — K5904 Chronic idiopathic constipation: Secondary | ICD-10-CM | POA: Diagnosis not present

## 2024-07-19 ENCOUNTER — Other Ambulatory Visit: Payer: Self-pay | Admitting: Family Medicine

## 2024-07-27 ENCOUNTER — Other Ambulatory Visit: Payer: Self-pay | Admitting: Family Medicine

## 2024-07-27 DIAGNOSIS — J4540 Moderate persistent asthma, uncomplicated: Secondary | ICD-10-CM

## 2024-07-30 NOTE — Progress Notes (Deleted)
    SUBJECTIVE:   CHIEF COMPLAINT / HPI:   Diabetes Current Regimen: None? CBGs: ***  Last A1c:  Lab Results  Component Value Date   HGBA1C 7.0 01/10/2024    Denies polyuria, polydipsia, hypoglycemia *** Last Eye Exam: DUE Statin: None? ACE/ARB: Olmesartan  20 mg daily  PERTINENT  PMH / PSH: ***  OBJECTIVE:   There were no vitals taken for this visit. ***  General: NAD, pleasant, able to participate in exam Cardiac: RRR, no murmurs. Respiratory: CTAB, normal effort, No wheezes, rales or rhonchi Abdomen: Bowel sounds present, nontender, nondistended Extremities: no edema or cyanosis. Skin: warm and dry, no rashes noted Neuro: alert, no obvious focal deficits Psych: Normal affect and mood  ASSESSMENT/PLAN:   No problem-specific Assessment & Plan notes found for this encounter.     Dr. Izetta Nap, DO [facility]    {    This will disappear when note is signed, click to select method of visit    :1}

## 2024-07-31 ENCOUNTER — Ambulatory Visit: Admitting: Family Medicine

## 2024-07-31 DIAGNOSIS — E1140 Type 2 diabetes mellitus with diabetic neuropathy, unspecified: Secondary | ICD-10-CM

## 2024-08-03 ENCOUNTER — Encounter: Payer: Self-pay | Admitting: Student

## 2024-08-03 ENCOUNTER — Ambulatory Visit (INDEPENDENT_AMBULATORY_CARE_PROVIDER_SITE_OTHER): Admitting: Student

## 2024-08-03 ENCOUNTER — Ambulatory Visit
Admission: RE | Admit: 2024-08-03 | Discharge: 2024-08-03 | Disposition: A | Discharge status: Home / Self Care | Source: Ambulatory Visit | Attending: Family Medicine | Admitting: Family Medicine

## 2024-08-03 VITALS — BP 138/84 | HR 66 | Ht 61.0 in | Wt 146.0 lb

## 2024-08-03 DIAGNOSIS — M25552 Pain in left hip: Secondary | ICD-10-CM

## 2024-08-03 DIAGNOSIS — Z23 Encounter for immunization: Secondary | ICD-10-CM

## 2024-08-03 NOTE — Progress Notes (Signed)
    SUBJECTIVE:   CHIEF COMPLAINT / HPI:   73 year old female history of CAD, GERD, diverticulosis Presenting today due to left lower abdominal pain. Patient said this has been chronic and has been going on for few months Patient describes the pain as intermittent and mostly over the left hip area Most times it is get better throughout the day as she starts moving around Has had abdominal ultrasound done that was negative for hernia She has also seen GI doctor without any confirmatory finding Denies any nausea, vomiting or diarrhea. Does endorse history of chronic constipation   PERTINENT  PMH / PSH: Reviewed   OBJECTIVE:   BP (!) 141/88   Pulse 66   Ht 5' 1 (1.549 m)   Wt 146 lb (66.2 kg)   SpO2 95%   BMI 27.59 kg/m    Physical Exam General: Alert, well appearing, NAD Cardiovascular: RRR, No Murmurs, Normal S2/S2 Respiratory: CTAB, No wheezing or Rales Abdomen: No distension or tenderness Extremities: Mild tenderness over the left pelvic area. Pain with abduction and external rotation of the LLE. Normal Gait  ASSESSMENT/PLAN:   Left Hip pain Suspect patients pain could be hip related rather than GI given her history,nature of pain, exam findings and negative GI work up. Will order pelvic xray and place referral to sport medicine for further evaluation and management.  Norleen April, MD [facility]

## 2024-08-03 NOTE — Patient Instructions (Addendum)
 Pleasure to see you today.  I have ordered x-ray of your left hip and pelvics.  You can go to [facility] imaging on [address] to have these imaging completed.  As discussed you can continue to do Tylenol  and ibuprofen  intermittently for pain management.

## 2024-08-16 NOTE — Progress Notes (Signed)
    SUBJECTIVE:   CHIEF COMPLAINT / HPI:   Left abdominal pain Seen 10/10 for similar concern.  Pelvic x-ray was ordered which was negative for any acute process in left hip.  Patient referred to sports medicine for additional care.  Starting physical therapy next week. Has difficulty going to the bathroom, this is chronic. Went back to Dr. Rollin (GI) to discuss and was put on Linzess. Feels like she has diarrhea when taking it. Also taking Miralax , having diarrhea if takes 2 cap fulls but 1 cap full does not work. Last colonoscopy last year had 5 polyps, repeat in 5 years.  Feels like she has a weak bladder. Some discoloration, in urine. Does not think it is blood.   URI Having symptoms x 4-5 days. Having some green discoloration with nasal drainage. Not taking anything for it. Denies SOB. Taking Arnuity for asthma, Reports her 74 year old grandson was coughing recently. No known exposure to COVID/Flu.  Diabetes Current Regimen: None CBGs: None  Last A1c:  Lab Results  Component Value Date   HGBA1C 7.1 (A) 08/21/2024    Denies polyuria, polydipsia, hypoglycemia Last Eye Exam: UTD - sees Dr. Octavia Statin: None ACE/ARB: Olmesartan  20mg  daily   PERTINENT  PMH / PSH: Varicose veins, CAD, asthma, GERD, T2DM, osteoporosis, HLD  OBJECTIVE:   BP (!) 146/96   Pulse (!) 114   Ht 5' 1 (1.549 m)   Wt 145 lb (65.8 kg)   SpO2 97%   BMI 27.40 kg/m    General: NAD, pleasant, able to participate in exam HEENT: TM clear bilaterally. Mildly erythematous pharynx. Moist mucus membranes. No palpable cervical lymphadenopathy Cardiac: RRR, no murmurs. Respiratory: CTAB, normal effort, No wheezes, rales or rhonchi. Intermittent cough. Abdomen: Bowel sounds present, nondistended. TTP over LLQ extending up towards LUQ and wrapping around to back in the same dermatome. No rebound tenderness of guarding. No bulging or hernia in L inguinal region. Extremities: no edema or cyanosis. Skin: warm and  dry, no rashes noted Neuro: alert, no obvious focal deficits Psych: Normal affect and mood  ASSESSMENT/PLAN:   Assessment & Plan Type 2 diabetes mellitus with diabetic neuropathy, without long-term current use of insulin  (HCC) A1c 7.1, well controlled with lifestyle modifications. -ACR, BMP -LDL, previously elevated but not on statin due to muscle aches. Will follow up if therapy indicated Upper respiratory tract infection, unspecified type Mild symptoms, normal respiratory exam. Mild tachycardia likely in the setting of persistent cough. Recommended supportive care with Flonase  and Mucinex . Return precautions discussed. -Flonase  2 sprays in each nostril while symptomatic Irritable bowel syndrome with constipation Likely contributing chronic, intermittent LLQ. Diarrhea with Linzess 145mcg daily, will titrate down and assess for constipation benefit. UTD on colonoscopy. Pelvic US  negative for 03/2024, consider additional imaging if persistent pain despite better control of constipation. -Decrease to Linzess 72 mcg daily Abnormal urine Occasional abnormal color, denies hematuria. Possibly related to hydration status. -UA Primary hypertension Elevated in office, recommend home monitoring and follow up for repeat in 1 month. Continue current regimen.   Dr. Izetta Nap, DO [facility]

## 2024-08-20 ENCOUNTER — Ambulatory Visit: Admitting: Family Medicine

## 2024-08-21 ENCOUNTER — Ambulatory Visit (INDEPENDENT_AMBULATORY_CARE_PROVIDER_SITE_OTHER): Admitting: Family Medicine

## 2024-08-21 ENCOUNTER — Encounter: Payer: Self-pay | Admitting: Family Medicine

## 2024-08-21 VITALS — BP 146/96 | HR 114 | Ht 61.0 in | Wt 145.0 lb

## 2024-08-21 DIAGNOSIS — J069 Acute upper respiratory infection, unspecified: Secondary | ICD-10-CM

## 2024-08-21 DIAGNOSIS — R8290 Unspecified abnormal findings in urine: Secondary | ICD-10-CM

## 2024-08-21 DIAGNOSIS — I10 Essential (primary) hypertension: Secondary | ICD-10-CM

## 2024-08-21 DIAGNOSIS — E1140 Type 2 diabetes mellitus with diabetic neuropathy, unspecified: Secondary | ICD-10-CM | POA: Diagnosis not present

## 2024-08-21 DIAGNOSIS — K581 Irritable bowel syndrome with constipation: Secondary | ICD-10-CM | POA: Diagnosis not present

## 2024-08-21 LAB — POCT URINALYSIS DIP (MANUAL ENTRY)
Bilirubin, UA: NEGATIVE
Blood, UA: NEGATIVE
Glucose, UA: NEGATIVE mg/dL
Ketones, POC UA: NEGATIVE mg/dL
Leukocytes, UA: NEGATIVE
Nitrite, UA: NEGATIVE
Protein Ur, POC: NEGATIVE mg/dL
Spec Grav, UA: 1.01 (ref 1.010–1.025)
Urobilinogen, UA: 0.2 U/dL
pH, UA: 7 (ref 5.0–8.0)

## 2024-08-21 LAB — POCT GLYCOSYLATED HEMOGLOBIN (HGB A1C): HbA1c, POC (controlled diabetic range): 7.1 % — AB (ref 0.0–7.0)

## 2024-08-21 MED ORDER — FLUTICASONE PROPIONATE 50 MCG/ACT NA SUSP: 2.0000 | Freq: Every day | NASAL | 6 refills | Status: AC

## 2024-08-21 MED ORDER — LINACLOTIDE 72 MCG PO CAPS: 72.0000 ug | ORAL_CAPSULE | Freq: Every day | ORAL | 1 refills | Status: AC

## 2024-08-21 NOTE — Assessment & Plan Note (Signed)
 A1c 7.1, well controlled with lifestyle modifications. -ACR, BMP -LDL, previously elevated but not on statin due to muscle aches. Will follow up if therapy indicated

## 2024-08-21 NOTE — Assessment & Plan Note (Signed)
 Elevated in office, recommend home monitoring and follow up for repeat in 1 month. Continue current regimen.

## 2024-08-21 NOTE — Patient Instructions (Signed)
 It was wonderful to see you today! Thank you for choosing [facility].   Please bring ALL of your medications with you to every visit.   Today we talked about:  I think your pain could be originating from your intermittent constipation and difficulty controlling your symptoms.  Lets try to reduce the dose of your Linzess to the 72 mcg daily and try to take it every single day to have a daily soft bowel movement.  If it is still causing you to have the diarrhea or you continue to have the persistent pain even with the regular bowel movement come back and see me and we can discuss if you need further imaging done. For your upper respiratory symptoms I do think doing some Flonase  2 sprays in each nostril while you are having the congestion and inflammation will help.  You can just use this while you are sick.  You can also use Mucinex  twice per day and drink lots of water to stay hydrated.  If you develop worsening shortness of breath please continue to use your inhaler but please follow-up with care. We are getting some blood work today to monitor some of your other chronic conditions and you will see this available on MyChart. Your A1c was 7.1, this is mostly unchanged from prior testing.  And he can continue off medication but please continue to focus on your diet.  Please reduce the amount of fried/fatty foods you intake and any packaged/processed sugar can worsen your blood sugar and also contribute to your diarrhea/constipation as above.  Please follow up in 4-6 weeks if persistent symptoms  If you haven't already, sign up for My Chart to have easy access to your labs results, and communication with your primary care physician.   We are checking some labs today. If they are abnormal, I will call you. If they are normal, I will send you a MyChart message (if it is active) or a letter in the mail. If you do not hear about your labs in the next 2 weeks, please call the office.  Call  the clinic at [phone] if your symptoms worsen or you have any concerns.  Please be sure to schedule follow up at the front desk before you leave today.   Izetta Nap, DO Family Medicine

## 2024-08-21 NOTE — Assessment & Plan Note (Signed)
 Likely contributing chronic, intermittent LLQ. Diarrhea with Linzess 145mcg daily, will titrate down and assess for constipation benefit. UTD on colonoscopy. Pelvic US  negative for 03/2024, consider additional imaging if persistent pain despite better control of constipation. -Decrease to Linzess 72 mcg daily

## 2024-08-22 LAB — BASIC METABOLIC PANEL WITH GFR
BUN/Creatinine Ratio: 8 — ABNORMAL LOW (ref 12–28)
BUN: 10 mg/dL (ref 8–27)
CO2: 23 mmol/L (ref 20–29)
Calcium: 10.4 mg/dL — ABNORMAL HIGH (ref 8.7–10.3)
Chloride: 98 mmol/L (ref 96–106)
Creatinine, Ser: 1.19 mg/dL — ABNORMAL HIGH (ref 0.57–1.00)
Glucose: 142 mg/dL — ABNORMAL HIGH (ref 70–99)
Potassium: 4.2 mmol/L (ref 3.5–5.2)
Sodium: 138 mmol/L (ref 134–144)
eGFR: 48 mL/min/1.73 — ABNORMAL LOW (ref >59)

## 2024-08-22 LAB — LDL CHOLESTEROL, DIRECT: LDL Direct: 151 mg/dL — ABNORMAL HIGH (ref 0–99)

## 2024-08-22 LAB — MICROALBUMIN / CREATININE URINE RATIO
Creatinine, Urine: 67.1 mg/dL
Microalb/Creat Ratio: 13 mg/g{creat} (ref 0–29)
Microalbumin, Urine: 8.6 ug/mL

## 2024-08-24 ENCOUNTER — Ambulatory Visit: Payer: Self-pay | Admitting: Family Medicine

## 2024-08-24 ENCOUNTER — Encounter: Payer: Self-pay | Admitting: Family Medicine

## 2024-08-24 DIAGNOSIS — N183 Chronic kidney disease, stage 3 unspecified: Secondary | ICD-10-CM | POA: Insufficient documentation

## 2024-08-24 DIAGNOSIS — E1140 Type 2 diabetes mellitus with diabetic neuropathy, unspecified: Secondary | ICD-10-CM

## 2024-08-24 DIAGNOSIS — I2510 Atherosclerotic heart disease of native coronary artery without angina pectoris: Secondary | ICD-10-CM

## 2024-08-24 DIAGNOSIS — N1831 Chronic kidney disease, stage 3a: Secondary | ICD-10-CM

## 2024-08-24 MED ORDER — COQ-10 100 MG PO CPCR: 100.0000 mg | ORAL_CAPSULE | Freq: Every day | ORAL | 2 refills | Status: AC

## 2024-08-24 MED ORDER — ROSUVASTATIN CALCIUM 5 MG PO TABS: 5.0000 mg | ORAL_TABLET | Freq: Every day | ORAL | 2 refills | Status: AC

## 2024-08-24 NOTE — Telephone Encounter (Signed)
 Called patient regarding lab results.  LDL 151, not improved from previously.  Previously on rosuvastatin  5 mg daily, unable to tolerate due to muscle aches.  Discussed options such as restarting a different statin or trialing rosuvastatin  again with co-Q10 supplementation.  Patient agreeable to retry the rosuvastatin  at a low dose with the supplementation.  Will repeat lipid panel in 3 months and titrate as able.  Lab work consistent with CKD 3a, patient unaware she has been diagnosed with this previously.  Recommended avoiding nephrotoxic agents.  ACR normal.  Will repeat BMP in 3 to 6 months.  Reports she weaned herself off pantoprazole  for reflux, taken off med list.  Ongoing right sided pain she is worried about her kidney.  Previously seen by Dr. Alvaro at [facility] urology and would like to have reevaluation given prior right kidney obstruction.  Advised patient can call and schedule an appointment given previously established with them if concerns.  Izetta Nap, DO

## 2024-08-27 ENCOUNTER — Ambulatory Visit: Admitting: Family Medicine

## 2024-09-03 ENCOUNTER — Other Ambulatory Visit: Payer: Self-pay | Admitting: Family Medicine

## 2024-09-19 NOTE — Progress Notes (Signed)
 Rahi Chandonnet                                          MRN: [id number]   09/19/2024   The [facility] Quality Team Specialist reviewed this patient medical record for the purposes of chart review for care gap closure. The following were reviewed: abstraction for care gap closure-glycemic status assessment.    [facility] Quality Team

## 2024-10-30 ENCOUNTER — Ambulatory Visit: Admitting: Family Medicine

## 2024-10-30 ENCOUNTER — Encounter: Payer: Self-pay | Admitting: Family Medicine

## 2024-10-30 VITALS — BP 134/81 | HR 79 | Ht 61.0 in | Wt 143.6 lb

## 2024-10-30 DIAGNOSIS — R1030 Lower abdominal pain, unspecified: Secondary | ICD-10-CM

## 2024-10-30 NOTE — Patient Instructions (Signed)
 It was wonderful to see you today! Thank you for choosing [facility].   Please bring ALL of your medications with you to every visit.   Today we talked about:  I put in a referral to another GI doctor so you can get a second opinion about your symptoms.  You continue to take the Linzess  in the meantime for your IBS symptoms.  I also think given the location we should get a pelvic ultrasound to take a further look at your reproductive organs to make sure that is not something else going on.  Our office will follow-up with you to coordinate scheduling this.  You can continue to take Tylenol  as needed for pain management up to 1000 mg 4 times per day.  If you develop any worsening fever, blood in your urine or unable to stool please return to care for further evaluation.  Please follow up in 1 months   Call the clinic at [phone] if your symptoms worsen or you have any concerns.  Please be sure to schedule follow up at the front desk before you leave today.   Izetta Nap, DO Family Medicine

## 2024-10-30 NOTE — Progress Notes (Signed)
" ° ° °  SUBJECTIVE:   CHIEF COMPLAINT / HPI:   Discussed the use of AI scribe software for clinical note transcription with the patient, who gave verbal consent to proceed.  Lower abdominal pain - Severe right-sided abdominal pain began on December 27 - Pain improved after 3-4 days, then recurred on Sunday - Pain radiates to the back, worse when lying on right side - Pain has been consistent with prior flareups of recurred over the past year - No dysuria.  Seen by nephrologist Dr. Alvaro in 09/2024, without urinary concern. - Bowel movements are small and associated with pain during defecation, known IBS-see taking Linzess  72 mcg daily.  Follows with Dr. Rollin with GI, would like second opinion - Had CT scan with nephrology on 09/27/2024 reviewed on patient's phone, records unfortunately not available in epic.  Nephrologist noted significant diverticulosis and concern for recurrent diverticulitis as cause of symptoms. - Uses Tylenol  for pain with minimal relief. Avoids NSAIDs - Previously tried Flexeril, which caused significant mood and mental changes similar to reactions in her siblings; does not want to use it again  - Last colonoscopy in 2024 had 5 polyps, repeat in 5 years.   PERTINENT  PMH / PSH: Varicose veins, CAD, asthma, GERD, T2DM, osteoporosis, HLD   OBJECTIVE:   BP 134/81   Pulse 79   Ht 5' 1 (1.549 m)   Wt 143 lb 9.6 oz (65.1 kg)   SpO2 96%   BMI 27.13 kg/m    General: NAD, pleasant, able to participate in exam Cardiac: RRR, no murmurs. Respiratory: CTAB, normal effort, No wheezes, rales or rhonchi Abdomen: Tender to palpation over right inguinal region, no notable bulging or abdominal wall defect noted.  Abdomen soft, nondistended.  Nontender to palpation over bilateral flank and lumbar spine.  No rebound tenderness or guarding. Extremities: no edema or cyanosis. Skin: warm and dry, no rashes noted Neuro: alert, no obvious focal deficits Psych: Normal affect and  mood  ASSESSMENT/PLAN:   Assessment & Plan Lower abdominal pain Unclear etiology given severity of pain, less likely completely explained by patient's underlying IBS-C.  Low inguinal pain upon exam concerning for GYN origin, will obtain pelvic ultrasound to elucidate further.  Recently had CT with nephrology in 12//2025, incomplete records available but suggestive of significant diverticulosis concerning for possible intermittent diverticulitis.  Patient requesting second opinion with GI, referral placed.  Advised continued dietary modifications, Tylenol  for pain management and return precautions discussed. -Referral to [facility] -Pelvic ultrasound   Dr. Izetta Nap, DO [facility]     "

## 2024-11-02 ENCOUNTER — Ambulatory Visit: Payer: Self-pay | Admitting: Family Medicine

## 2024-11-06 NOTE — Progress Notes (Unsigned)
 [facility] Office Note:  .   Date:  11/07/2024  ID:  Diana Roach, DOB Dec 23, 1950, MRN [id number] PCP: Theophilus Pagan, MD  [facility] Providers Cardiologist:  Stanly DELENA Leavens, MD {  History of Present Illness: .   Diana Roach is a 74 y.o. female with nonobstructive CAD by CCTA 05/2024, history of whitecoat syndrome, hyperlipidemia, type 2 diabetes, GERD, CKD.     Palpitations 2022 heart monitor with no concerning arrhythmias.  EF normal.  Chest pain 03/2023 admission.  Felt to be noncardiac chest pain.  Also reported palpitations felt to be noncardiac.  EF normal. 05/2024 CCTA ordered not by cardiology, with CAC score of 112.  Minimal nonobstructive CAD.  Social history  Lives at home with her daughter     Patient with history of palpitations/chest pain felt to be noncardiac in the past.  She was seen last 06/2023 and palpitations and chest pain were resolved essentially.  Later seen in 2025 by PCP who had ordered CCTA demonstrating minimal nonobstructive CAD.  Today patient presents for annual follow-up.  She offers no acute complaints today and continues to report very rare episodes of palpitations maybe once or twice a year but not of any significant concern for her.  She has not had any further issues of chest pain.  Reports going to the gym 2-3 times a week doing cardio and that time some light weight training.  Reports history of asthma.  Has no exertional limitations.    ROS: Denies: Chest pain, shortness of breath, orthopnea, peripheral edema, syncope, decreased exercise capacity, fatigue, dizziness.   Studies Reviewed: SABRA    EKG Interpretation Date/Time:  Wednesday November 07 2024 09:46:54 EST Ventricular Rate:  69 PR Interval:  178 QRS Duration:  72 QT Interval:  398 QTC Calculation: 426 R Axis:   13  Text Interpretation: Normal sinus rhythm with sinus arrhythmia Nonspecific ST and T wave abnormality When compared with ECG of  22-Dec-2023 14:11, Premature atrial complexes are no longer Present Confirmed by Diana Roach [phone]) on 11/07/2024 9:50:12 AM    Risk Assessment/Calculations:           Physical Exam:   VS:  BP 114/72 (BP Location: Left Arm, Patient Position: Sitting, Cuff Size: Normal)   Pulse 69   Ht 5' 1 (1.549 m)   Wt 144 lb (65.3 kg)   BMI 27.21 kg/m    Wt Readings from Last 3 Encounters:  11/07/24 144 lb (65.3 kg)  10/30/24 143 lb 9.6 oz (65.1 kg)  08/21/24 145 lb (65.8 kg)    GEN: Well nourished, well developed in no acute distress NECK: No JVD; No carotid bruits CARDIAC: RRR, no murmurs, rubs, gallops RESPIRATORY:  Clear to auscultation without rales, wheezing or rhonchi  ABDOMEN: Soft, non-tender, non-distended EXTREMITIES:  No edema; No deformity   ASSESSMENT AND PLAN: .    Palpitations Very rare palpitations, stable.    Nonobstructive CAD Hyperlipidemia -05/2024 CCTA CAC score of 112.  Minimal nonobstructive CAD. No anginal complaints or equivalents that would prompt further evaluation at this time.  Continue risk reduction. Continue with aspirin  daily.  Reports intermittent compliance with rosuvastatin  5 mg secondary to myalgias but her LDL 07/2024 was 151.  She will make a better attempt and try to stay on this consistently for the next couple months.  Repeat lipid panel at that time.   LDL goal less than 70 would be ideal.  If not at target goal then may need to consider  PCSK9 inhibitor.  History of whitecoat syndrome Blood pressure well-controlled.  Not on any BP meds.  Aortic regurgitation Echocardiogram 12/2023 with preserved biventricular function.  There was mild AR.  Repeat echocardiogram 3 to 5 years.  Type 2 diabetes A1c decently controlled.  7.1%.  SGLT2 inhibitor recommended with underlying CKD.  Defer to PCP.    Dispo: Follow-up 1 year with Dr. Santo.  Signed, Thom LITTIE Sluder, PA-C

## 2024-11-07 ENCOUNTER — Ambulatory Visit: Attending: Cardiology | Admitting: Cardiology

## 2024-11-07 ENCOUNTER — Ambulatory Visit
Admission: RE | Admit: 2024-11-07 | Discharge: 2024-11-07 | Disposition: A | Discharge status: Home / Self Care | Source: Ambulatory Visit | Attending: Family Medicine | Admitting: Family Medicine

## 2024-11-07 ENCOUNTER — Encounter: Payer: Self-pay | Admitting: Cardiology

## 2024-11-07 VITALS — BP 114/72 | HR 69 | Ht 61.0 in | Wt 144.0 lb

## 2024-11-07 DIAGNOSIS — R030 Elevated blood-pressure reading, without diagnosis of hypertension: Secondary | ICD-10-CM | POA: Diagnosis not present

## 2024-11-07 DIAGNOSIS — R1030 Lower abdominal pain, unspecified: Secondary | ICD-10-CM

## 2024-11-07 DIAGNOSIS — E785 Hyperlipidemia, unspecified: Secondary | ICD-10-CM | POA: Diagnosis not present

## 2024-11-07 DIAGNOSIS — R002 Palpitations: Secondary | ICD-10-CM | POA: Diagnosis not present

## 2024-11-07 DIAGNOSIS — I10 Essential (primary) hypertension: Secondary | ICD-10-CM

## 2024-11-07 NOTE — Patient Instructions (Addendum)
 Medication Instructions:  NO CHANGES  Lab Work: FASTING LIPID PANEL TO BE DONE IN 2 MONTHS. (AROUND 01-05-25)  Testing/Procedures: NONE  Follow-Up: At [facility], you and your health needs are our priority.  As part of our continuing mission to provide you with exceptional heart care, our providers are all part of one team.  This team includes your primary Cardiologist (physician) and Advanced Practice Providers or APPs (Physician Assistants and Nurse Practitioners) who all work together to provide you with the care you need, when you need it.  Your next appointment:   1 YEAR  Provider:   Stanly DELENA Leavens, MD     Other Instructions:

## 2024-11-15 ENCOUNTER — Ambulatory Visit: Payer: Self-pay | Admitting: Family Medicine

## 2024-11-21 NOTE — Progress Notes (Signed)
" ° ° °  SUBJECTIVE:   CHIEF COMPLAINT / HPI:   Discussed the use of AI scribe software for clinical note transcription with the patient, who gave verbal consent to proceed.  Nasal congestion and sinus pressure - Several weeks of persistent nasal congestion with a 'stuck' sensation - Sinus pressure and head pain - Intermittent green or yellow nasal drainage - Nasal sores with slight bleeding - No documented fevers - Uses Flonase  and humidifier at home for symptom relief - Mild ear pain and itching around the ear, associated with hearing aid use - Intermittent shortness of breath, attributed to asthma - No sore throat, but feels raspy - Uses Tylenol  for relief  Hair loss - Increased hair loss with strands coming out - Similar episode occurred after prior COVID-19 infection, previously required cutting hair short       PERTINENT  PMH / PSH: Varicose veins, CAD, asthma, GERD, T2DM, osteoporosis, HLD   OBJECTIVE:   BP 138/80   Pulse 75   Ht 5' 1 (1.549 m)   Wt 145 lb 3.2 oz (65.9 kg)   SpO2 96%   BMI 27.44 kg/m    General: NAD, pleasant, able to participate in exam HEENT: Mildly erythematous ear canals bilaterally with normal TM.  Mildly erythematous oropharynx.  Tenderness to sinus palpation.  No palpable cervical lymphadenopathy. Cardiac: RRR, no murmurs. Respiratory: CTAB, normal effort, No wheezes, rales or rhonchi Abdomen: Bowel sounds present, nontender, nondistended Extremities: no edema or cyanosis. Skin: warm and dry.  Thinning of hair noted along crown without skin changes. Neuro: alert, no obvious focal deficits Psych: Normal affect and mood  ASSESSMENT/PLAN:   Assessment & Plan Acute recurrent sinusitis, unspecified location Prolonged symptoms with green drainage and significant sinus pressure suggestive of bacterial sinusitis.  Noted allergy to Augmentin , will trial doxycycline  and supportive care relief. - Start doxycycline  100 mg twice daily x 5 days -  Continue Flonase , two sprays each nostril during symptoms. - Mucinex  for mucus clearance - Use cool mist humidifier at night, nasal saline for hydration - Apply Vaseline around nasal brim to prevent dryness. Hair loss Mild symptoms noted on crown consistent with female pattern baldness.  Discussed options, patient will trial topical minoxidil if desired. -Can obtain minoxidil topical OTC foam or solution and instructed on use and need for 3+ months on therapy to see benefit   Dr. Izetta Nap, DO [facility]     "

## 2024-11-23 ENCOUNTER — Encounter: Payer: Self-pay | Admitting: Family Medicine

## 2024-11-23 ENCOUNTER — Ambulatory Visit: Payer: Self-pay | Admitting: Family Medicine

## 2024-11-23 VITALS — BP 138/80 | HR 75 | Ht 61.0 in | Wt 145.2 lb

## 2024-11-23 DIAGNOSIS — J0191 Acute recurrent sinusitis, unspecified: Secondary | ICD-10-CM | POA: Diagnosis not present

## 2024-11-23 DIAGNOSIS — L659 Nonscarring hair loss, unspecified: Secondary | ICD-10-CM | POA: Diagnosis not present

## 2024-11-23 MED ORDER — DOXYCYCLINE HYCLATE 100 MG PO TABS: 100.0000 mg | ORAL_TABLET | Freq: Two times a day (BID) | ORAL | 0 refills | Status: AC

## 2024-11-23 NOTE — Patient Instructions (Addendum)
 It was wonderful to see you today! Thank you for choosing [facility].   Please bring ALL of your medications with you to every visit.   Today we talked about:  Unfortunately you have developed sinusitis which we should treat with antibiotics, I sent in doxycycline  which you take twice per day for the next 5 days.  Please also use the Flonase  with 2 sprays in each nostril daily while you are sick.  It is often helpful if you do this after a warm shower or using his nasal saline rinse so the medication penetrates.  You can also get Mucinex  over-the-counter that you take once or twice per day to help with mucus breakdown.  Please to stay well-hydrated while you are using it. For your hair loss as we discussed unfortunately is not a lot of great options.  You can try using the topical minoxidil which insurance will not pay for but you can find over-the-counter at your pharmacy.  Usually you can use the foam 5% once per day or the topical solution 2% twice per day that will dry in your hair.  As we discussed sometimes it can burn and you often have to use it for at least 3 months to see full effect.  Please follow up if persistent symptoms  Call the clinic at [phone] if your symptoms worsen or you have any concerns.  Please be sure to schedule follow up at the front desk before you leave today.   Izetta Nap, DO Family Medicine

## 2024-12-10 ENCOUNTER — Encounter
